# Patient Record
Sex: Female | Born: 1950
Health system: Southern US, Community
[De-identification: ages and names within clinical notes are randomized; demographics above are authoritative.]

## PROBLEM LIST (undated history)

## (undated) DIAGNOSIS — R233 Spontaneous ecchymoses: Secondary | ICD-10-CM

## (undated) DIAGNOSIS — J189 Pneumonia, unspecified organism: Secondary | ICD-10-CM

## (undated) DIAGNOSIS — J302 Other seasonal allergic rhinitis: Secondary | ICD-10-CM

## (undated) DIAGNOSIS — E041 Nontoxic single thyroid nodule: Secondary | ICD-10-CM

## (undated) DIAGNOSIS — Z85828 Personal history of other malignant neoplasm of skin: Secondary | ICD-10-CM

## (undated) DIAGNOSIS — R202 Paresthesia of skin: Secondary | ICD-10-CM

## (undated) DIAGNOSIS — G4733 Obstructive sleep apnea (adult) (pediatric): Secondary | ICD-10-CM

## (undated) DIAGNOSIS — Z9889 Other specified postprocedural states: Secondary | ICD-10-CM

## (undated) DIAGNOSIS — I639 Cerebral infarction, unspecified: Secondary | ICD-10-CM

## (undated) DIAGNOSIS — H269 Unspecified cataract: Secondary | ICD-10-CM

## (undated) DIAGNOSIS — K649 Unspecified hemorrhoids: Secondary | ICD-10-CM

## (undated) DIAGNOSIS — J45909 Unspecified asthma, uncomplicated: Secondary | ICD-10-CM

## (undated) DIAGNOSIS — M8580 Other specified disorders of bone density and structure, unspecified site: Secondary | ICD-10-CM

## (undated) DIAGNOSIS — Z923 Personal history of irradiation: Secondary | ICD-10-CM

## (undated) DIAGNOSIS — J439 Emphysema, unspecified: Secondary | ICD-10-CM

## (undated) DIAGNOSIS — G43909 Migraine, unspecified, not intractable, without status migrainosus: Secondary | ICD-10-CM

## (undated) DIAGNOSIS — G4730 Sleep apnea, unspecified: Secondary | ICD-10-CM

## (undated) DIAGNOSIS — R238 Other skin changes: Secondary | ICD-10-CM

## (undated) DIAGNOSIS — J3089 Other allergic rhinitis: Secondary | ICD-10-CM

## (undated) DIAGNOSIS — Z974 Presence of external hearing-aid: Secondary | ICD-10-CM

## (undated) DIAGNOSIS — Z853 Personal history of malignant neoplasm of breast: Secondary | ICD-10-CM

## (undated) DIAGNOSIS — N950 Postmenopausal bleeding: Secondary | ICD-10-CM

## (undated) DIAGNOSIS — Z8489 Family history of other specified conditions: Secondary | ICD-10-CM

## (undated) DIAGNOSIS — K219 Gastro-esophageal reflux disease without esophagitis: Secondary | ICD-10-CM

## (undated) DIAGNOSIS — Z98811 Dental restoration status: Secondary | ICD-10-CM

## (undated) DIAGNOSIS — C50919 Malignant neoplasm of unspecified site of unspecified female breast: Secondary | ICD-10-CM

## (undated) DIAGNOSIS — I2510 Atherosclerotic heart disease of native coronary artery without angina pectoris: Secondary | ICD-10-CM

## (undated) DIAGNOSIS — C4491 Basal cell carcinoma of skin, unspecified: Secondary | ICD-10-CM

## (undated) DIAGNOSIS — K5900 Constipation, unspecified: Secondary | ICD-10-CM

## (undated) DIAGNOSIS — Z973 Presence of spectacles and contact lenses: Secondary | ICD-10-CM

## (undated) DIAGNOSIS — R232 Flushing: Secondary | ICD-10-CM

## (undated) DIAGNOSIS — M1990 Unspecified osteoarthritis, unspecified site: Secondary | ICD-10-CM

## (undated) DIAGNOSIS — R9389 Abnormal findings on diagnostic imaging of other specified body structures: Secondary | ICD-10-CM

## (undated) DIAGNOSIS — I7090 Unspecified atherosclerosis: Secondary | ICD-10-CM

## (undated) DIAGNOSIS — I10 Essential (primary) hypertension: Secondary | ICD-10-CM

## (undated) DIAGNOSIS — N3281 Overactive bladder: Secondary | ICD-10-CM

## (undated) HISTORY — DX: Other specified disorders of bone density and structure, unspecified site: M85.80

## (undated) HISTORY — PX: BROW LIFT: SHX178

## (undated) HISTORY — DX: Unspecified hemorrhoids: K64.9

## (undated) HISTORY — DX: Gastro-esophageal reflux disease without esophagitis: K21.9

## (undated) HISTORY — DX: Atherosclerotic heart disease of native coronary artery without angina pectoris: I25.10

---

## 1998-07-25 ENCOUNTER — Other Ambulatory Visit: Admission: RE | Admit: 1998-07-25 | Discharge: 1998-07-25 | Payer: Self-pay | Admitting: Obstetrics and Gynecology

## 1999-03-20 ENCOUNTER — Other Ambulatory Visit: Admission: RE | Admit: 1999-03-20 | Discharge: 1999-03-20 | Payer: Self-pay | Admitting: Obstetrics and Gynecology

## 1999-03-20 ENCOUNTER — Encounter (INDEPENDENT_AMBULATORY_CARE_PROVIDER_SITE_OTHER): Payer: Self-pay

## 1999-08-14 ENCOUNTER — Other Ambulatory Visit: Admission: RE | Admit: 1999-08-14 | Discharge: 1999-08-14 | Payer: Self-pay | Admitting: Obstetrics and Gynecology

## 1999-08-15 ENCOUNTER — Other Ambulatory Visit: Admission: RE | Admit: 1999-08-15 | Discharge: 1999-08-15 | Payer: Self-pay | Admitting: Obstetrics and Gynecology

## 1999-08-15 ENCOUNTER — Encounter (INDEPENDENT_AMBULATORY_CARE_PROVIDER_SITE_OTHER): Payer: Self-pay | Admitting: Specialist

## 1999-10-21 ENCOUNTER — Other Ambulatory Visit: Admission: RE | Admit: 1999-10-21 | Discharge: 1999-10-21 | Payer: Self-pay | Admitting: Obstetrics and Gynecology

## 1999-10-21 ENCOUNTER — Encounter (INDEPENDENT_AMBULATORY_CARE_PROVIDER_SITE_OTHER): Payer: Self-pay | Admitting: Specialist

## 2000-08-14 ENCOUNTER — Other Ambulatory Visit: Admission: RE | Admit: 2000-08-14 | Discharge: 2000-08-14 | Payer: Self-pay | Admitting: Obstetrics and Gynecology

## 2000-08-17 ENCOUNTER — Other Ambulatory Visit: Admission: RE | Admit: 2000-08-17 | Discharge: 2000-08-17 | Payer: Self-pay | Admitting: Obstetrics and Gynecology

## 2001-02-02 ENCOUNTER — Other Ambulatory Visit: Admission: RE | Admit: 2001-02-02 | Discharge: 2001-02-02 | Payer: Self-pay | Admitting: Obstetrics and Gynecology

## 2001-09-30 ENCOUNTER — Other Ambulatory Visit: Admission: RE | Admit: 2001-09-30 | Discharge: 2001-09-30 | Payer: Self-pay | Admitting: Obstetrics and Gynecology

## 2003-01-03 ENCOUNTER — Other Ambulatory Visit: Admission: RE | Admit: 2003-01-03 | Discharge: 2003-01-03 | Payer: Self-pay | Admitting: Obstetrics and Gynecology

## 2004-01-04 ENCOUNTER — Other Ambulatory Visit: Admission: RE | Admit: 2004-01-04 | Discharge: 2004-01-04 | Payer: Self-pay | Admitting: Obstetrics and Gynecology

## 2005-01-31 ENCOUNTER — Other Ambulatory Visit: Admission: RE | Admit: 2005-01-31 | Discharge: 2005-01-31 | Payer: Self-pay | Admitting: Obstetrics and Gynecology

## 2006-03-04 ENCOUNTER — Other Ambulatory Visit: Admission: RE | Admit: 2006-03-04 | Discharge: 2006-03-04 | Payer: Self-pay | Admitting: Obstetrics and Gynecology

## 2007-05-20 ENCOUNTER — Other Ambulatory Visit: Admission: RE | Admit: 2007-05-20 | Discharge: 2007-05-20 | Payer: Self-pay | Admitting: Obstetrics and Gynecology

## 2008-07-24 ENCOUNTER — Encounter: Payer: Self-pay | Admitting: Obstetrics and Gynecology

## 2008-07-24 ENCOUNTER — Ambulatory Visit: Payer: Self-pay | Admitting: Obstetrics and Gynecology

## 2008-07-24 ENCOUNTER — Other Ambulatory Visit: Admission: RE | Admit: 2008-07-24 | Discharge: 2008-07-24 | Payer: Self-pay | Admitting: Obstetrics and Gynecology

## 2009-07-28 HISTORY — PX: COLONOSCOPY: SHX174

## 2009-08-20 ENCOUNTER — Ambulatory Visit: Payer: Self-pay | Admitting: Obstetrics and Gynecology

## 2009-08-20 ENCOUNTER — Other Ambulatory Visit: Admission: RE | Admit: 2009-08-20 | Discharge: 2009-08-20 | Payer: Self-pay | Admitting: Obstetrics and Gynecology

## 2009-08-31 ENCOUNTER — Ambulatory Visit: Payer: Self-pay | Admitting: Obstetrics and Gynecology

## 2009-09-04 ENCOUNTER — Ambulatory Visit: Payer: Self-pay | Admitting: Obstetrics and Gynecology

## 2010-04-10 ENCOUNTER — Ambulatory Visit: Payer: Self-pay | Admitting: Gynecology

## 2010-04-29 ENCOUNTER — Ambulatory Visit: Payer: Self-pay | Admitting: Obstetrics and Gynecology

## 2010-05-16 ENCOUNTER — Ambulatory Visit: Payer: Self-pay | Admitting: Obstetrics and Gynecology

## 2010-05-20 ENCOUNTER — Ambulatory Visit: Payer: Self-pay | Admitting: Obstetrics and Gynecology

## 2010-07-28 DIAGNOSIS — E041 Nontoxic single thyroid nodule: Secondary | ICD-10-CM

## 2010-07-28 HISTORY — DX: Nontoxic single thyroid nodule: E04.1

## 2010-09-19 ENCOUNTER — Other Ambulatory Visit: Payer: Self-pay | Admitting: Obstetrics and Gynecology

## 2010-09-19 ENCOUNTER — Encounter (INDEPENDENT_AMBULATORY_CARE_PROVIDER_SITE_OTHER): Payer: BC Managed Care – PPO | Admitting: Obstetrics and Gynecology

## 2010-09-19 ENCOUNTER — Other Ambulatory Visit (HOSPITAL_COMMUNITY)
Admission: RE | Admit: 2010-09-19 | Discharge: 2010-09-19 | Disposition: A | Discharge status: Home / Self Care | Payer: BC Managed Care – PPO | Source: Ambulatory Visit | Attending: Obstetrics and Gynecology | Admitting: Obstetrics and Gynecology

## 2010-09-19 DIAGNOSIS — Z13220 Encounter for screening for lipoid disorders: Secondary | ICD-10-CM

## 2010-09-19 DIAGNOSIS — Z124 Encounter for screening for malignant neoplasm of cervix: Secondary | ICD-10-CM | POA: Insufficient documentation

## 2010-09-19 DIAGNOSIS — Z01419 Encounter for gynecological examination (general) (routine) without abnormal findings: Secondary | ICD-10-CM

## 2010-09-19 DIAGNOSIS — Z833 Family history of diabetes mellitus: Secondary | ICD-10-CM

## 2011-04-03 ENCOUNTER — Other Ambulatory Visit: Payer: Self-pay | Admitting: Internal Medicine

## 2011-04-03 DIAGNOSIS — E041 Nontoxic single thyroid nodule: Secondary | ICD-10-CM

## 2011-04-03 DIAGNOSIS — L659 Nonscarring hair loss, unspecified: Secondary | ICD-10-CM

## 2011-04-04 ENCOUNTER — Ambulatory Visit
Admission: RE | Admit: 2011-04-04 | Discharge: 2011-04-04 | Disposition: A | Discharge status: Home / Self Care | Payer: BC Managed Care – PPO | Source: Ambulatory Visit | Attending: Internal Medicine | Admitting: Internal Medicine

## 2011-04-04 DIAGNOSIS — E041 Nontoxic single thyroid nodule: Secondary | ICD-10-CM

## 2011-04-04 DIAGNOSIS — L659 Nonscarring hair loss, unspecified: Secondary | ICD-10-CM

## 2011-05-06 ENCOUNTER — Other Ambulatory Visit: Payer: Self-pay | Admitting: *Deleted

## 2011-05-06 DIAGNOSIS — N6009 Solitary cyst of unspecified breast: Secondary | ICD-10-CM

## 2011-05-13 ENCOUNTER — Other Ambulatory Visit: Payer: Self-pay | Admitting: Obstetrics and Gynecology

## 2011-05-13 DIAGNOSIS — N6009 Solitary cyst of unspecified breast: Secondary | ICD-10-CM

## 2011-06-15 ENCOUNTER — Other Ambulatory Visit: Payer: Self-pay | Admitting: Obstetrics and Gynecology

## 2011-08-08 ENCOUNTER — Other Ambulatory Visit: Payer: Self-pay | Admitting: Obstetrics and Gynecology

## 2011-09-25 ENCOUNTER — Other Ambulatory Visit: Payer: Self-pay | Admitting: Obstetrics and Gynecology

## 2011-09-25 ENCOUNTER — Encounter (INDEPENDENT_AMBULATORY_CARE_PROVIDER_SITE_OTHER): Payer: BC Managed Care – PPO

## 2011-09-25 DIAGNOSIS — M2550 Pain in unspecified joint: Secondary | ICD-10-CM | POA: Insufficient documentation

## 2011-09-25 DIAGNOSIS — IMO0002 Reserved for concepts with insufficient information to code with codable children: Secondary | ICD-10-CM | POA: Insufficient documentation

## 2011-09-25 DIAGNOSIS — M8580 Other specified disorders of bone density and structure, unspecified site: Secondary | ICD-10-CM

## 2011-09-25 DIAGNOSIS — M899 Disorder of bone, unspecified: Secondary | ICD-10-CM

## 2011-10-07 ENCOUNTER — Encounter: Payer: Self-pay | Admitting: Obstetrics and Gynecology

## 2011-10-10 ENCOUNTER — Encounter: Payer: Self-pay | Admitting: Obstetrics and Gynecology

## 2011-10-10 ENCOUNTER — Other Ambulatory Visit (HOSPITAL_COMMUNITY)
Admission: RE | Admit: 2011-10-10 | Discharge: 2011-10-10 | Disposition: A | Discharge status: Home / Self Care | Payer: BC Managed Care – PPO | Source: Ambulatory Visit | Attending: Obstetrics and Gynecology | Admitting: Obstetrics and Gynecology

## 2011-10-10 ENCOUNTER — Ambulatory Visit (INDEPENDENT_AMBULATORY_CARE_PROVIDER_SITE_OTHER): Payer: BC Managed Care – PPO | Admitting: Obstetrics and Gynecology

## 2011-10-10 VITALS — BP 120/74 | Ht 64.0 in | Wt 148.0 lb

## 2011-10-10 DIAGNOSIS — R12 Heartburn: Secondary | ICD-10-CM | POA: Insufficient documentation

## 2011-10-10 DIAGNOSIS — Z01419 Encounter for gynecological examination (general) (routine) without abnormal findings: Secondary | ICD-10-CM | POA: Insufficient documentation

## 2011-10-10 DIAGNOSIS — Z833 Family history of diabetes mellitus: Secondary | ICD-10-CM

## 2011-10-10 MED ORDER — ALPRAZOLAM 0.25 MG PO TABS: 0.2500 mg | ORAL_TABLET | Freq: Every evening | ORAL | Status: AC | PRN

## 2011-10-10 MED ORDER — CONJ ESTROG-MEDROXYPROGEST ACE 0.45-1.5 MG PO TABS: 1.0000 | ORAL_TABLET | Freq: Every day | ORAL | Status: DC

## 2011-10-10 MED ORDER — ESTRADIOL 10 MCG VA TABS: ORAL_TABLET | VAGINAL | Status: DC

## 2011-10-10 NOTE — Progress Notes (Signed)
Patient came to see me today for her annual GYN exam. She remains on HRT. If she tries to lower the dose or stop it  she is very symptomatic. She is well aware of the slightly higher risk of breast cancer but  Feels  benefits outweigh the risks. She is having no vaginal bleeding. She is having no pelvic pain. She is up-to-date on mammograms. Her last bone density was done here last month. She has stable osteopenia without elevated fracture risk. She is taking calcium and vitamin D. She has had no fractures. She has overactive bladder and has been to the urologist. She was treated with biofeedback but not medication. She was using Vagifem and found that her symptoms were less on the Vagifem.she has stopped the Vagifem but would like to restart it. She also requested something to help her sleep on occasion.  Physical examination:  Kennon Portela present. HEENT within normal limits. Neck: Thyroid not large. No masses. Supraclavicular nodes: not enlarged. Breasts: Examined in both sitting midline position. No skin changes and no masses. Abdomen: Soft no guarding rebound or masses or hernia. Pelvic: External: Within normal limits. BUS: Within normal limits. Vaginal:within normal limits. Good estrogen effect. No evidence of cystocele rectocele or enterocele. Cervix: clean. Uterus: Normal size and shape. Adnexa: No masses. Rectovaginal exam: Confirmatory and negative. Extremities: Within normal limits.    Assessment: #1. Menopausal symptoms #2. Atrophic vaginitis #3. Detrussor instability #4. Osteopenia  Plan: Continue Prempro 0.45 after discussing pros and cons including the higher risk of breast cancer. Reinitiate Vagifem 3 times a week. Continue yearly mammograms. Prescription for Xanax written for sleep disturbance. Patient to return fasting for lab work.

## 2011-10-11 LAB — URINALYSIS W MICROSCOPIC + REFLEX CULTURE
Bacteria, UA: NONE SEEN
Hgb urine dipstick: NEGATIVE
Ketones, ur: NEGATIVE mg/dL
Leukocytes, UA: NEGATIVE
Nitrite: NEGATIVE
Urobilinogen, UA: 0.2 mg/dL (ref 0.0–1.0)

## 2011-10-16 ENCOUNTER — Other Ambulatory Visit: Payer: Self-pay | Admitting: Obstetrics and Gynecology

## 2011-10-21 ENCOUNTER — Other Ambulatory Visit: Payer: BC Managed Care – PPO

## 2011-10-21 DIAGNOSIS — Z01419 Encounter for gynecological examination (general) (routine) without abnormal findings: Secondary | ICD-10-CM

## 2011-10-21 LAB — CBC WITH DIFFERENTIAL/PLATELET
Hemoglobin: 14.2 g/dL (ref 12.0–15.0)
Lymphs Abs: 2.3 10*3/uL (ref 0.7–4.0)
MCH: 29.8 pg (ref 26.0–34.0)
Monocytes Relative: 7 % (ref 3–12)
Neutro Abs: 5.5 10*3/uL (ref 1.7–7.7)
Neutrophils Relative %: 64 % (ref 43–77)
RBC: 4.77 MIL/uL (ref 3.87–5.11)

## 2011-10-29 ENCOUNTER — Other Ambulatory Visit: Payer: Self-pay | Admitting: *Deleted

## 2011-10-29 DIAGNOSIS — Z833 Family history of diabetes mellitus: Secondary | ICD-10-CM

## 2011-12-12 ENCOUNTER — Other Ambulatory Visit: Payer: BC Managed Care – PPO

## 2011-12-12 DIAGNOSIS — Z833 Family history of diabetes mellitus: Secondary | ICD-10-CM

## 2011-12-12 DIAGNOSIS — Z01419 Encounter for gynecological examination (general) (routine) without abnormal findings: Secondary | ICD-10-CM

## 2011-12-12 LAB — LIPID PANEL
Cholesterol: 193 mg/dL (ref 0–200)
Total CHOL/HDL Ratio: 3.2 Ratio
Triglycerides: 70 mg/dL (ref <150)
VLDL: 14 mg/dL (ref 0–40)

## 2012-01-15 ENCOUNTER — Telehealth: Payer: Self-pay | Admitting: *Deleted

## 2012-01-15 NOTE — Telephone Encounter (Signed)
Pt informed of most recent lab results for /17/13.

## 2012-05-07 ENCOUNTER — Other Ambulatory Visit: Payer: Self-pay | Admitting: *Deleted

## 2012-05-07 DIAGNOSIS — R921 Mammographic calcification found on diagnostic imaging of breast: Secondary | ICD-10-CM

## 2012-05-14 ENCOUNTER — Other Ambulatory Visit: Payer: Self-pay | Admitting: Obstetrics and Gynecology

## 2012-05-14 DIAGNOSIS — R921 Mammographic calcification found on diagnostic imaging of breast: Secondary | ICD-10-CM

## 2012-07-13 ENCOUNTER — Ambulatory Visit (INDEPENDENT_AMBULATORY_CARE_PROVIDER_SITE_OTHER): Payer: BC Managed Care – PPO | Admitting: Psychology

## 2012-07-13 DIAGNOSIS — F331 Major depressive disorder, recurrent, moderate: Secondary | ICD-10-CM

## 2012-07-27 ENCOUNTER — Ambulatory Visit (INDEPENDENT_AMBULATORY_CARE_PROVIDER_SITE_OTHER): Payer: BC Managed Care – PPO | Admitting: Psychology

## 2012-07-27 DIAGNOSIS — F331 Major depressive disorder, recurrent, moderate: Secondary | ICD-10-CM

## 2012-08-04 ENCOUNTER — Ambulatory Visit (INDEPENDENT_AMBULATORY_CARE_PROVIDER_SITE_OTHER): Payer: BC Managed Care – PPO | Admitting: Psychology

## 2012-08-04 DIAGNOSIS — F331 Major depressive disorder, recurrent, moderate: Secondary | ICD-10-CM

## 2012-08-10 ENCOUNTER — Ambulatory Visit (INDEPENDENT_AMBULATORY_CARE_PROVIDER_SITE_OTHER): Payer: BC Managed Care – PPO | Admitting: Psychology

## 2012-08-10 DIAGNOSIS — F331 Major depressive disorder, recurrent, moderate: Secondary | ICD-10-CM

## 2012-08-18 ENCOUNTER — Ambulatory Visit (INDEPENDENT_AMBULATORY_CARE_PROVIDER_SITE_OTHER): Payer: BC Managed Care – PPO | Admitting: Psychology

## 2012-08-18 DIAGNOSIS — F331 Major depressive disorder, recurrent, moderate: Secondary | ICD-10-CM

## 2012-08-25 ENCOUNTER — Ambulatory Visit (INDEPENDENT_AMBULATORY_CARE_PROVIDER_SITE_OTHER): Payer: BC Managed Care – PPO | Admitting: Psychology

## 2012-08-25 DIAGNOSIS — F331 Major depressive disorder, recurrent, moderate: Secondary | ICD-10-CM

## 2012-08-29 ENCOUNTER — Other Ambulatory Visit: Payer: Self-pay | Admitting: Obstetrics and Gynecology

## 2012-08-30 NOTE — Telephone Encounter (Signed)
rx called in KW 

## 2012-08-30 NOTE — Telephone Encounter (Signed)
Pt is a former Dr Eda Paschal patient. She uses Xanax periodically for sleep. Her last rx was 09/2011. She is due for her annual 09/2012. KW

## 2012-09-01 ENCOUNTER — Ambulatory Visit (INDEPENDENT_AMBULATORY_CARE_PROVIDER_SITE_OTHER): Payer: BC Managed Care – PPO | Admitting: Psychology

## 2012-09-01 DIAGNOSIS — F331 Major depressive disorder, recurrent, moderate: Secondary | ICD-10-CM

## 2012-09-16 ENCOUNTER — Ambulatory Visit (INDEPENDENT_AMBULATORY_CARE_PROVIDER_SITE_OTHER): Payer: BC Managed Care – PPO | Admitting: Psychology

## 2012-09-16 DIAGNOSIS — F331 Major depressive disorder, recurrent, moderate: Secondary | ICD-10-CM

## 2012-09-22 ENCOUNTER — Ambulatory Visit: Payer: BC Managed Care – PPO | Admitting: Psychology

## 2012-09-29 ENCOUNTER — Ambulatory Visit (INDEPENDENT_AMBULATORY_CARE_PROVIDER_SITE_OTHER): Payer: BC Managed Care – PPO | Admitting: Psychology

## 2012-09-29 DIAGNOSIS — F331 Major depressive disorder, recurrent, moderate: Secondary | ICD-10-CM

## 2012-10-13 ENCOUNTER — Ambulatory Visit (INDEPENDENT_AMBULATORY_CARE_PROVIDER_SITE_OTHER): Payer: BC Managed Care – PPO | Admitting: Psychology

## 2012-10-13 ENCOUNTER — Other Ambulatory Visit: Payer: Self-pay | Admitting: *Deleted

## 2012-10-13 DIAGNOSIS — F331 Major depressive disorder, recurrent, moderate: Secondary | ICD-10-CM

## 2012-10-13 MED ORDER — CONJ ESTROG-MEDROXYPROGEST ACE 0.45-1.5 MG PO TABS: 1.0000 | ORAL_TABLET | Freq: Every day | ORAL | Status: DC

## 2012-10-20 ENCOUNTER — Ambulatory Visit (INDEPENDENT_AMBULATORY_CARE_PROVIDER_SITE_OTHER): Payer: BC Managed Care – PPO | Admitting: Psychology

## 2012-10-20 DIAGNOSIS — F331 Major depressive disorder, recurrent, moderate: Secondary | ICD-10-CM

## 2012-10-22 ENCOUNTER — Encounter: Payer: Self-pay | Admitting: Obstetrics and Gynecology

## 2012-10-22 ENCOUNTER — Ambulatory Visit (INDEPENDENT_AMBULATORY_CARE_PROVIDER_SITE_OTHER): Payer: BC Managed Care – PPO | Admitting: Gynecology

## 2012-10-22 ENCOUNTER — Encounter: Payer: Self-pay | Admitting: Gynecology

## 2012-10-22 VITALS — BP 120/70 | Ht 65.0 in | Wt 147.0 lb

## 2012-10-22 DIAGNOSIS — Z01419 Encounter for gynecological examination (general) (routine) without abnormal findings: Secondary | ICD-10-CM

## 2012-10-22 DIAGNOSIS — Z79890 Hormone replacement therapy (postmenopausal): Secondary | ICD-10-CM

## 2012-10-22 DIAGNOSIS — M899 Disorder of bone, unspecified: Secondary | ICD-10-CM

## 2012-10-22 DIAGNOSIS — M8580 Other specified disorders of bone density and structure, unspecified site: Secondary | ICD-10-CM

## 2012-10-22 DIAGNOSIS — N318 Other neuromuscular dysfunction of bladder: Secondary | ICD-10-CM

## 2012-10-22 DIAGNOSIS — N3281 Overactive bladder: Secondary | ICD-10-CM

## 2012-10-22 MED ORDER — CONJ ESTROG-MEDROXYPROGEST ACE 0.45-1.5 MG PO TABS: 1.0000 | ORAL_TABLET | Freq: Every day | ORAL | Status: DC

## 2012-10-22 MED ORDER — ESTRADIOL 10 MCG VA TABS: ORAL_TABLET | VAGINAL | Status: DC

## 2012-10-22 NOTE — Progress Notes (Signed)
Sabrina Tapia [birth year] [id number]        62 y.o.  G0P0 for annual exam.  Former patient of Dr. Eda Tapia. Several issues noted below.  Past medical history,surgical history, medications, allergies, family history and social history were all reviewed and documented in the EPIC chart. ROS:  Was performed and pertinent positives and negatives are included in the history.  Exam: Sabrina Tapia Filed Vitals:   10/22/12 1153  BP: 120/70  Height: 5\' 5"  (1.651 m)  Weight: 147 lb (66.679 kg)   General appearance  Normal Skin grossly normal Head/Neck normal with no cervical or supraclavicular adenopathy thyroid normal Lungs  clear Cardiac RR, without RMG Abdominal  soft, nontender, without masses, organomegaly or hernia Breasts  examined lying and sitting without masses, retractions, discharge or axillary adenopathy. Pelvic  Ext/BUS/vagina  normal with atrophic changes  Cervix  normal   Uterus  anteverted, normal size, shape and contour, midline and mobile nontender   Adnexa  Without masses or tenderness    Anus and perineum  normal   Rectovaginal  normal sphincter tone without palpated masses or tenderness.    Assessment/Plan:  62 y.o. G0P0 female for annual exam.   1. HRT. Patient's on Prempro 0.45-1.5. Doing well on this. Had tried weaning previously with unacceptable hot flushes/sweats. I reviewed the WHI study with increased risk of stroke heart attack DVT and breast cancer. ACOG and NAMS statements her lowest dose for shortest period of time reviewed. Options to continue or try to wean discussed. The patient would prefer to continue at this time accepting the risks I refilled her times year. 2. Detrusor instability. Patient's having urgency type symptoms on and off. Saw urology who talked about behavior modification. When she does avoid caffeine and carbonated beverages she does better. Uses Vagifem 10 mcg twice to 3 times weekly and this seems to help. She stopped in the past and  had a flare of her urgency symptoms. Reviewed medication alternatives to include Enablex or similar or Myrbetriq. Patient is not interested at this time. She preferred just to monitor. We'll follow up she wants trial of medication. 3. Osteopenia. DEXA 08/2011 T score -2.1 FRAX 9.7%/1.3%. Is on extra vitamin D and calcium. He is to have blood drawn for her primary physician this coming spring I asked her to add a vitamin D level she will asked him to do so. We'll plan repeat DEXA next year to year or irritable. 4. Pap smear 2013. No Pap smear done today. History of LGSIL 2001 colposcopy biopsy negative. No treatment. Negative Pap smears since then. Reviewed current screening guidelines and plan Pap smear repeat at 3 year interval. 5. Mammography 04/2012. Continue with annual mammography. SBE monthly reviewed. 6. Health maintenance. The blood work done as it is all done through Dr. Clarene Tapia office. Followup one year, sooner as needed.    Dara Lords MD, 12:36 PM 10/22/2012

## 2012-10-22 NOTE — Patient Instructions (Signed)
Followup in 1 year. Sooner if any issues.

## 2012-11-03 ENCOUNTER — Ambulatory Visit (INDEPENDENT_AMBULATORY_CARE_PROVIDER_SITE_OTHER): Payer: BC Managed Care – PPO | Admitting: Psychology

## 2012-11-03 DIAGNOSIS — F331 Major depressive disorder, recurrent, moderate: Secondary | ICD-10-CM

## 2012-11-17 ENCOUNTER — Ambulatory Visit (INDEPENDENT_AMBULATORY_CARE_PROVIDER_SITE_OTHER): Payer: BC Managed Care – PPO | Admitting: Psychology

## 2012-11-17 DIAGNOSIS — F331 Major depressive disorder, recurrent, moderate: Secondary | ICD-10-CM

## 2012-12-02 ENCOUNTER — Ambulatory Visit (INDEPENDENT_AMBULATORY_CARE_PROVIDER_SITE_OTHER): Payer: BC Managed Care – PPO | Admitting: Psychology

## 2012-12-02 DIAGNOSIS — F331 Major depressive disorder, recurrent, moderate: Secondary | ICD-10-CM

## 2012-12-16 ENCOUNTER — Ambulatory Visit (INDEPENDENT_AMBULATORY_CARE_PROVIDER_SITE_OTHER): Payer: BC Managed Care – PPO | Admitting: Psychology

## 2012-12-16 DIAGNOSIS — F331 Major depressive disorder, recurrent, moderate: Secondary | ICD-10-CM

## 2012-12-31 ENCOUNTER — Ambulatory Visit (INDEPENDENT_AMBULATORY_CARE_PROVIDER_SITE_OTHER): Payer: BC Managed Care – PPO | Admitting: Psychology

## 2012-12-31 DIAGNOSIS — F331 Major depressive disorder, recurrent, moderate: Secondary | ICD-10-CM

## 2013-01-13 ENCOUNTER — Ambulatory Visit: Payer: BC Managed Care – PPO | Admitting: Psychology

## 2013-01-21 ENCOUNTER — Ambulatory Visit: Payer: BC Managed Care – PPO | Admitting: Psychology

## 2013-01-31 ENCOUNTER — Ambulatory Visit (INDEPENDENT_AMBULATORY_CARE_PROVIDER_SITE_OTHER): Payer: BC Managed Care – PPO | Admitting: Psychology

## 2013-01-31 DIAGNOSIS — F331 Major depressive disorder, recurrent, moderate: Secondary | ICD-10-CM

## 2013-02-17 ENCOUNTER — Ambulatory Visit: Payer: BC Managed Care – PPO | Admitting: Psychology

## 2013-02-28 ENCOUNTER — Ambulatory Visit (INDEPENDENT_AMBULATORY_CARE_PROVIDER_SITE_OTHER): Payer: BC Managed Care – PPO | Admitting: Psychology

## 2013-02-28 DIAGNOSIS — F331 Major depressive disorder, recurrent, moderate: Secondary | ICD-10-CM

## 2013-03-17 ENCOUNTER — Ambulatory Visit (INDEPENDENT_AMBULATORY_CARE_PROVIDER_SITE_OTHER): Payer: BC Managed Care – PPO | Admitting: Psychology

## 2013-03-17 DIAGNOSIS — F331 Major depressive disorder, recurrent, moderate: Secondary | ICD-10-CM

## 2013-03-29 ENCOUNTER — Ambulatory Visit (INDEPENDENT_AMBULATORY_CARE_PROVIDER_SITE_OTHER): Payer: BC Managed Care – PPO | Admitting: Psychology

## 2013-03-29 DIAGNOSIS — F331 Major depressive disorder, recurrent, moderate: Secondary | ICD-10-CM

## 2013-04-18 ENCOUNTER — Ambulatory Visit (INDEPENDENT_AMBULATORY_CARE_PROVIDER_SITE_OTHER): Payer: BC Managed Care – PPO | Admitting: Psychology

## 2013-04-18 DIAGNOSIS — F331 Major depressive disorder, recurrent, moderate: Secondary | ICD-10-CM

## 2013-05-06 ENCOUNTER — Ambulatory Visit (INDEPENDENT_AMBULATORY_CARE_PROVIDER_SITE_OTHER): Payer: BC Managed Care – PPO | Admitting: Psychology

## 2013-05-06 DIAGNOSIS — F331 Major depressive disorder, recurrent, moderate: Secondary | ICD-10-CM

## 2013-05-16 ENCOUNTER — Encounter: Payer: Self-pay | Admitting: Gynecology

## 2013-05-16 ENCOUNTER — Ambulatory Visit (INDEPENDENT_AMBULATORY_CARE_PROVIDER_SITE_OTHER): Payer: BC Managed Care – PPO | Admitting: Gynecology

## 2013-05-16 DIAGNOSIS — R32 Unspecified urinary incontinence: Secondary | ICD-10-CM

## 2013-05-16 DIAGNOSIS — N9089 Other specified noninflammatory disorders of vulva and perineum: Secondary | ICD-10-CM

## 2013-05-16 DIAGNOSIS — L293 Anogenital pruritus, unspecified: Secondary | ICD-10-CM

## 2013-05-16 DIAGNOSIS — N907 Vulvar cyst: Secondary | ICD-10-CM

## 2013-05-16 DIAGNOSIS — N898 Other specified noninflammatory disorders of vagina: Secondary | ICD-10-CM

## 2013-05-16 LAB — WET PREP FOR TRICH, YEAST, CLUE
Clue Cells Wet Prep HPF POC: NONE SEEN
Trich, Wet Prep: NONE SEEN

## 2013-05-16 MED ORDER — CLINDAMYCIN PHOSPHATE 2 % VA CREA: 1.0000 | TOPICAL_CREAM | Freq: Every day | VAGINAL | Status: DC

## 2013-05-16 NOTE — Progress Notes (Signed)
Patient presents with 3 issues: 1. Vaginal itching noticed in the last several days. No real significant discharge. 2. Small mass left upper labia noticed 2 days ago. Not tender or draining. 3. Urinary incontinence. Notes loss of urine sometimes spontaneously in small amounts without coughing sneezing laughing or urgency symptoms.  Exam with [name] assistant Abdomen soft nontender without masses guarding rebound organomegaly Pelvic external BUS vagina with atrophic changes. Small mobile 1 cm mass upper junction of left labia minora and labia majora. No overlying skin changes or connection with the skin. No erythema or draining. No inguinal adenopathy. Vagina atrophic with yellowish discharge. Cervix atrophic flush with the upper vagina. Bimanual without masses or tenderness.  Assessment and plan: 1. Vaginal itching. Exam consistent with Bactroban stenosis. Wet prep confirms. We'll treat with Cleocin vaginal cream nightly x7 days at her choice. 2. Small mass upper left labia lateral to the clitoral region. Suspect small subcutaneous cyst. Noninflammatory and nontender. Options to observe/attempt drainage/excision reviewed. At this point patient would prefer observation. If enlarges or becomes symptomatic will represent. It stays stable then we'll follow up at her March 2015 office visit. 3. Urinary incontinence. Had been evaluated earlier by urologist and felt more consistent with urgency symptoms. Was having more of these previously exacerbated by caffeine and carbonated beverages. These current symptoms seem less urgency but more sphincter incompetence. No SUI symptoms. Possible asymptomatic detrusor instability leading to the loss of urine. Patient needs repeat evaluation by urology and probable urodynamics. Possible treatment options to include surgery versus medication review with the patient. Patient to followup with urology and we'll go from there.

## 2013-05-16 NOTE — Patient Instructions (Signed)
Office will call you to help arrange an appointment with urology. Use the vaginal cream nightly for 7 days. Followup if symptoms persist, worsen or recur. Follow the small lump in the vulva. If it changes such as enlarges or becomes painful then followup for treatment, otherwise followup in March 2015 when you're due for her annual exam.

## 2013-05-17 ENCOUNTER — Telehealth: Payer: Self-pay | Admitting: *Deleted

## 2013-05-17 LAB — URINALYSIS W MICROSCOPIC + REFLEX CULTURE
Bacteria, UA: NONE SEEN
Bilirubin Urine: NEGATIVE
Crystals: NONE SEEN
Glucose, UA: NEGATIVE mg/dL
Ketones, ur: NEGATIVE mg/dL
Specific Gravity, Urine: 1.011 (ref 1.005–1.030)
Squamous Epithelial / LPF: NONE SEEN

## 2013-05-17 NOTE — Telephone Encounter (Signed)
Message copied by Aura Camps on Tue May 17, 2013  9:28 AM ------      Message from: Dara Lords      Created: Mon May 16, 2013  4:30 PM       Schedule appointment with Dr. Terrilee Files in urology reference urinary incontinence. ------

## 2013-05-17 NOTE — Telephone Encounter (Signed)
Appt. On 06/15/13 @ 12:45 pm pt informed with the time and date, note will be faxed.

## 2013-05-18 ENCOUNTER — Other Ambulatory Visit: Payer: Self-pay | Admitting: Gynecology

## 2013-05-18 MED ORDER — NITROFURANTOIN MONOHYD MACRO 100 MG PO CAPS: 100.0000 mg | ORAL_CAPSULE | Freq: Two times a day (BID) | ORAL | Status: DC

## 2013-05-27 ENCOUNTER — Ambulatory Visit (INDEPENDENT_AMBULATORY_CARE_PROVIDER_SITE_OTHER): Payer: BC Managed Care – PPO | Admitting: Psychology

## 2013-05-27 DIAGNOSIS — F331 Major depressive disorder, recurrent, moderate: Secondary | ICD-10-CM

## 2013-06-03 ENCOUNTER — Telehealth: Payer: Self-pay | Admitting: *Deleted

## 2013-06-03 MED ORDER — FLUCONAZOLE 150 MG PO TABS: 150.0000 mg | ORAL_TABLET | Freq: Once | ORAL | Status: DC

## 2013-06-03 NOTE — Telephone Encounter (Signed)
Diflucan 150mg x 1

## 2013-06-03 NOTE — Telephone Encounter (Signed)
Pt informed, rx sent 

## 2013-06-03 NOTE — Telephone Encounter (Signed)
Pt was treated for BV infection on 05/16/13 and for UTI on 05/18/13 took both Rx and now having some vaginal discomfort itching mainly. No odor, no burning, no back pain. Please advise

## 2013-06-13 ENCOUNTER — Telehealth: Payer: Self-pay | Admitting: *Deleted

## 2013-06-13 NOTE — Telephone Encounter (Signed)
Pt called requesting order faxed to [facility] for diag. Mammogram, order faxed. Pt informed.

## 2013-06-16 ENCOUNTER — Ambulatory Visit: Payer: BC Managed Care – PPO | Admitting: Psychology

## 2013-06-27 ENCOUNTER — Encounter: Payer: Self-pay | Admitting: Gynecology

## 2013-06-27 ENCOUNTER — Other Ambulatory Visit: Payer: Self-pay | Admitting: Radiology

## 2013-06-28 ENCOUNTER — Other Ambulatory Visit: Payer: Self-pay | Admitting: *Deleted

## 2013-06-28 DIAGNOSIS — Z9889 Other specified postprocedural states: Secondary | ICD-10-CM

## 2013-06-29 ENCOUNTER — Ambulatory Visit (INDEPENDENT_AMBULATORY_CARE_PROVIDER_SITE_OTHER): Payer: BC Managed Care – PPO | Admitting: Psychology

## 2013-06-29 DIAGNOSIS — F331 Major depressive disorder, recurrent, moderate: Secondary | ICD-10-CM

## 2013-06-30 ENCOUNTER — Encounter: Payer: Self-pay | Admitting: Gynecology

## 2013-07-14 ENCOUNTER — Ambulatory Visit: Payer: BC Managed Care – PPO | Admitting: Psychology

## 2013-07-25 ENCOUNTER — Ambulatory Visit (INDEPENDENT_AMBULATORY_CARE_PROVIDER_SITE_OTHER): Payer: BC Managed Care – PPO | Admitting: Psychology

## 2013-07-25 DIAGNOSIS — F331 Major depressive disorder, recurrent, moderate: Secondary | ICD-10-CM

## 2013-08-09 ENCOUNTER — Other Ambulatory Visit: Payer: Self-pay | Admitting: Dermatology

## 2013-08-11 ENCOUNTER — Ambulatory Visit (INDEPENDENT_AMBULATORY_CARE_PROVIDER_SITE_OTHER): Payer: BC Managed Care – PPO | Admitting: Psychology

## 2013-08-11 DIAGNOSIS — F331 Major depressive disorder, recurrent, moderate: Secondary | ICD-10-CM

## 2013-09-01 ENCOUNTER — Ambulatory Visit (INDEPENDENT_AMBULATORY_CARE_PROVIDER_SITE_OTHER): Payer: BC Managed Care – PPO | Admitting: Psychology

## 2013-09-01 DIAGNOSIS — F331 Major depressive disorder, recurrent, moderate: Secondary | ICD-10-CM

## 2013-09-22 ENCOUNTER — Ambulatory Visit: Payer: BC Managed Care – PPO | Admitting: Psychology

## 2013-10-06 ENCOUNTER — Ambulatory Visit: Payer: BC Managed Care – PPO | Admitting: Psychology

## 2013-10-10 ENCOUNTER — Other Ambulatory Visit: Payer: Self-pay | Admitting: Gynecology

## 2013-10-26 ENCOUNTER — Ambulatory Visit (INDEPENDENT_AMBULATORY_CARE_PROVIDER_SITE_OTHER): Payer: BC Managed Care – PPO | Admitting: Psychology

## 2013-10-26 DIAGNOSIS — F331 Major depressive disorder, recurrent, moderate: Secondary | ICD-10-CM

## 2013-10-27 ENCOUNTER — Ambulatory Visit (INDEPENDENT_AMBULATORY_CARE_PROVIDER_SITE_OTHER): Payer: BC Managed Care – PPO | Admitting: Gynecology

## 2013-10-27 ENCOUNTER — Encounter: Payer: Self-pay | Admitting: Gynecology

## 2013-10-27 VITALS — BP 120/76 | Ht 64.0 in | Wt 154.0 lb

## 2013-10-27 DIAGNOSIS — M899 Disorder of bone, unspecified: Secondary | ICD-10-CM

## 2013-10-27 DIAGNOSIS — N318 Other neuromuscular dysfunction of bladder: Secondary | ICD-10-CM

## 2013-10-27 DIAGNOSIS — Z79890 Hormone replacement therapy (postmenopausal): Secondary | ICD-10-CM

## 2013-10-27 DIAGNOSIS — M8580 Other specified disorders of bone density and structure, unspecified site: Secondary | ICD-10-CM

## 2013-10-27 DIAGNOSIS — Z01419 Encounter for gynecological examination (general) (routine) without abnormal findings: Secondary | ICD-10-CM

## 2013-10-27 DIAGNOSIS — N3281 Overactive bladder: Secondary | ICD-10-CM

## 2013-10-27 DIAGNOSIS — M949 Disorder of cartilage, unspecified: Secondary | ICD-10-CM

## 2013-10-27 MED ORDER — CONJ ESTROG-MEDROXYPROGEST ACE 0.45-1.5 MG PO TABS: ORAL_TABLET | ORAL | Status: DC

## 2013-10-27 NOTE — Patient Instructions (Signed)
Follow-up for bone density as scheduled.  Follow-up in 1 year for annual exam. 

## 2013-10-27 NOTE — Progress Notes (Signed)
Sabrina Tapia Jun 22, 1951 [id number]        63 y.o.  G0P0 for annual exam.  Several issues noted below.  Past medical history,surgical history, problem list, medications, allergies, family history and social history were all reviewed and documented in the EPIC chart.  ROS:  Performed and pertinent positives and negatives are included in the history, assessment and plan .  Exam: [name] assistant Filed Vitals:   10/27/13 1101  BP: 120/76  Height: 5\' 4"  (1.626 m)  Weight: 154 lb (69.854 kg)   General appearance  Normal Skin grossly normal Head/Neck normal with no cervical or supraclavicular adenopathy thyroid normal Lungs  clear Cardiac RR, without RMG Abdominal  soft, nontender, without masses, organomegaly or hernia Breasts  examined lying and sitting without masses, retractions, discharge or axillary adenopathy. Pelvic  Ext/BUS/vagina with generalized atrophic changes  Cervix normal with atrophic changes  Uterus anteverted normal size midline mobile nontender  Adnexa  Without masses or tenderness    Anus and perineum  Normal   Rectovaginal  Normal sphincter tone without palpated masses or tenderness.    Assessment/Plan:  63 y.o. G0P0 female for annual exam.   1. Postmenopausal/HRT. Continues on Prempro 0.45/1.5.  I again reviewed the whole issue of HRT with her to include the WHI study with increased risk of stroke, heart attack, DVT and breast cancer. The ACOG and NAMS statements for lowest dose for the shortest period of time reviewed. Transdermal versus oral first-pass effect benefit discussed. After lengthy discussion the patient wants to continue it I refilled her x1 year. 2. Detrusor instability. Patient saw urology who gave her a number of different samples. She's tried Enablex, VESIcare and myrbetriq.  She feels VESIcare works best and 5 mg. Has annual prescription from the urologist. Check urinalysis today. 3. Osteopenia. DEXA 08/2013 T score -2.1 FRAX 9.7%/1.3%. Repeat DEXA  now a two-year interval. Increase calcium vitamin D reviewed. 4. Pap smear 2013. No Pap smear done today. Plan repeat Pap smear next year a 3 year interval. History of LGSIL 2001 with negative Pap smears since then. 5. Mammography 06/2013. Complex cyst was seen. They recommended aspiration biopsy but unclear if patient had this done. We will followup with her. SBE monthly reviewed. 6. Colonoscopy 2011. Repeat at their recommended interval. 7. Health maintenance. No routine blood work done as she reports this all done through her primary physician's office. Followup in one year, sooner as needed.   Note: This document was prepared with digital dictation and possible smart phrase technology. Any transcriptional errors that result from this process are unintentional.   Anastasio Auerbach MD, 11:47 AM 10/27/2013

## 2013-10-28 LAB — URINALYSIS W MICROSCOPIC + REFLEX CULTURE
Bacteria, UA: NONE SEEN
Bilirubin Urine: NEGATIVE
Casts: NONE SEEN
Crystals: NONE SEEN
GLUCOSE, UA: NEGATIVE mg/dL
Hgb urine dipstick: NEGATIVE
KETONES UR: NEGATIVE mg/dL
Leukocytes, UA: NEGATIVE
Nitrite: NEGATIVE
Protein, ur: NEGATIVE mg/dL
SQUAMOUS EPITHELIAL / LPF: NONE SEEN
Specific Gravity, Urine: 1.005 (ref 1.005–1.030)
Urobilinogen, UA: 0.2 mg/dL (ref 0.0–1.0)
pH: 7 (ref 5.0–8.0)

## 2013-10-31 ENCOUNTER — Telehealth: Payer: Self-pay | Admitting: *Deleted

## 2013-10-31 NOTE — Telephone Encounter (Signed)
Left message for pt to call.

## 2013-10-31 NOTE — Telephone Encounter (Signed)
Message copied by Thamas Jaegers on Mon Oct 31, 2013 12:25 PM ------      Message from: Anastasio Auerbach      Created: Thu Oct 27, 2013 11:55 AM       Check with patient. I read her mammogram and followup ultrasound report from December. She had a complex cyst and they stated aspiration/biopsy recommended in their ultrasound report. It stated they discussed this with the patient but I do not see where that was done and I did not ask her about it at the visit. Let me know. ------

## 2013-11-01 NOTE — Telephone Encounter (Signed)
Pt said she had this done, I spoke with [facility] and results will be faxed to be scanned in system.

## 2013-11-25 DIAGNOSIS — M8580 Other specified disorders of bone density and structure, unspecified site: Secondary | ICD-10-CM

## 2013-11-25 HISTORY — DX: Other specified disorders of bone density and structure, unspecified site: M85.80

## 2013-12-03 ENCOUNTER — Other Ambulatory Visit: Payer: Self-pay | Admitting: Gynecology

## 2013-12-15 ENCOUNTER — Ambulatory Visit (INDEPENDENT_AMBULATORY_CARE_PROVIDER_SITE_OTHER): Payer: BC Managed Care – PPO

## 2013-12-15 DIAGNOSIS — M899 Disorder of bone, unspecified: Secondary | ICD-10-CM

## 2013-12-15 DIAGNOSIS — M8580 Other specified disorders of bone density and structure, unspecified site: Secondary | ICD-10-CM

## 2013-12-15 DIAGNOSIS — M949 Disorder of cartilage, unspecified: Secondary | ICD-10-CM

## 2013-12-16 ENCOUNTER — Ambulatory Visit (INDEPENDENT_AMBULATORY_CARE_PROVIDER_SITE_OTHER): Payer: BC Managed Care – PPO | Admitting: Psychology

## 2013-12-16 DIAGNOSIS — F331 Major depressive disorder, recurrent, moderate: Secondary | ICD-10-CM

## 2013-12-20 ENCOUNTER — Ambulatory Visit (INDEPENDENT_AMBULATORY_CARE_PROVIDER_SITE_OTHER): Payer: BC Managed Care – PPO | Admitting: General Surgery

## 2013-12-20 ENCOUNTER — Encounter: Payer: Self-pay | Admitting: Gynecology

## 2013-12-20 VITALS — BP 116/70 | HR 74 | Temp 97.3°F | Resp 16 | Ht 64.0 in | Wt 150.6 lb

## 2013-12-20 DIAGNOSIS — K645 Perianal venous thrombosis: Secondary | ICD-10-CM

## 2013-12-20 MED ORDER — LIDOCAINE 5 % EX OINT: 1.0000 "application " | TOPICAL_OINTMENT | CUTANEOUS | Status: DC | PRN

## 2013-12-20 MED ORDER — HYDROCODONE-ACETAMINOPHEN 5-325 MG PO TABS: 1.0000 | ORAL_TABLET | Freq: Four times a day (QID) | ORAL | Status: DC | PRN

## 2013-12-20 NOTE — Patient Instructions (Signed)
Warm sitz baths as needed. Wear a pad for drainage.

## 2013-12-20 NOTE — Progress Notes (Signed)
Chief complaint: Rectal pain, hemorrhoids  History: Patient is a pleasant 63 year old female who presents with approximately 4-5 days of increasing rectal discomfort and a palpable lump. She has a history of a thrombosed external hemorrhoid about 3 years ago that was excised in the office. At that point she states that the pain had become very severe prior to the procedure and she did not want f happen again. She is having only mild to moderate discomfort. No bleeding. She does not have any significant chronic hemorrhoid symptoms between these 2 episodes. She does have some intermittent chronic constipation and was constipated last week but now is taking MiraLAX with some improvement.  Past Medical History  Diagnosis Date  . Joint pain   . Osteopenia 11/2013    T score -2.1 FRAX 7.7%/0.7% stable from prior DEXA  . Heart burn   . Cervical polyp   . LGSIL (low grade squamous intraepithelial dysplasia) 2001    colpo biopsy negative, negative paps since  . Detrusor instability   . GERD (gastroesophageal reflux disease)   . Arthritis    Past Surgical History  Procedure Laterality Date  . Colposcopy     Current Outpatient Prescriptions  Medication Sig Dispense Refill  . ALPRAZolam (XANAX) 0.25 MG tablet TAKE 1 TABLET BY MOUTH AT BEDTIME AS NEEDED FOR SLEEP  30 tablet  0  . aspirin 81 MG tablet Take 81 mg by mouth daily.      . Calcium Carbonate-Vitamin D (CALCIUM + D PO) Take by mouth.      . Cholecalciferol (VITAMIN D) 2000 UNITS CAPS Take by mouth.      . estrogen, conjugated,-medroxyprogesterone (PREMPRO) 0.45-1.5 MG per tablet TAKE 1 TABLET BY MOUTH EVERY DAY  28 tablet  11  . fluticasone (FLONASE) 50 MCG/ACT nasal spray       . LUTEIN PO Take by mouth.      . Multiple Vitamin (MULTIVITAMIN) tablet Take 1 tablet by mouth daily.      . Omega-3 Fatty Acids (FISH OIL PO) Take by mouth.      . Omeprazole (PRILOSEC PO) Take by mouth.      Marland Kitchen OVER THE COUNTER MEDICATION       . PREMPRO  0.45-1.5 MG per tablet TAKE 1 TABLET BY MOUTH EVERY DAY  28 tablet  11  . Probiotic Product (PROBIOTIC DAILY PO) Take by mouth.      Marland Kitchen QVAR 80 MCG/ACT inhaler       . VESICARE 5 MG tablet       . HYDROcodone-acetaminophen (NORCO) 5-325 MG per tablet Take 1-2 tablets by mouth every 6 (six) hours as needed.  30 tablet  0  . lidocaine (XYLOCAINE) 5 % ointment Apply 1 application topically as needed.  35.44 g  0   No current facility-administered medications for this visit.   Exam: BP 116/70  Pulse 74  Temp(Src) 97.3 F (36.3 C) (Temporal)  Resp 16  Ht 5\' 4"  (1.626 m)  Wt 150 lb 9.6 oz (68.312 kg)  BMI 25.84 kg/m2 General: Well-developed healthy-appearing Caucasian female pertinent exam was limited do a rectal exam. There are 2 thrombosed external hemorrhoids in the right lateral and left lateral position, somewhat larger on the right where she is symptomatic. No significant inflammation. These are small, subtle 1 cm each.  Assessment and plan: Thrombosed external hemorrhoids. I discussed options since these are small and mildly symptomatic. This included symptomatic management versus excision. She is for a concern that this could get worse  as her last episode did and she would like to have these excised which is reasonable.  After explaining the procedure under local anesthesia I performed 2 Limited excisions of the areas described above. Clot was removed from each. The wounds were cauterized. She was given a prescription for pain medication and lidocaine ointment. She will return if she is not feeling steadily better.

## 2014-01-30 ENCOUNTER — Telehealth (INDEPENDENT_AMBULATORY_CARE_PROVIDER_SITE_OTHER): Payer: Self-pay | Admitting: General Surgery

## 2014-01-30 DIAGNOSIS — K644 Residual hemorrhoidal skin tags: Secondary | ICD-10-CM

## 2014-01-30 MED ORDER — HYDROCORTISONE 2.5 % RE CREA: 1.0000 "application " | TOPICAL_CREAM | Freq: Four times a day (QID) | RECTAL | Status: DC

## 2014-01-30 NOTE — Telephone Encounter (Signed)
Patient called in explaining that she was seen in urgent office for thrombose hems on 12/20/13.  One has healed beautifully but the other still has a small opening.  Denies drainage or sharp pains but it does flare up with bowel movements.  She explained that it is mildly uncomfortable and she would like to see if there are any creams that Dr. Excell Seltzer would be willing to call in for her.  She is continuing with sitz baths 2x daily as well as ice 2x daily.  Informed her that he might want to see her back again before prescribing anything but that I would send this message first to see what he says.  Informed her that we would get back in touch with her once we received a response.  Patient verbalized understanding.

## 2014-01-30 NOTE — Telephone Encounter (Signed)
Could call in Anusol HC cream 4 times a day when necessary please if not improving needs to come back to the office.

## 2014-01-30 NOTE — Addendum Note (Signed)
Addended byFlossie Buffy on: 01/30/2014 05:12 PM   Modules accepted: Orders

## 2014-01-30 NOTE — Telephone Encounter (Signed)
Called patient to let her know that we e-scribe anusol cream to her pharmacy.  Instructed her to try this as directed but that if it does not seem to be improving in the next week or two she needs to give [place] a call so we can recheck her.

## 2014-05-11 ENCOUNTER — Ambulatory Visit (INDEPENDENT_AMBULATORY_CARE_PROVIDER_SITE_OTHER): Payer: BC Managed Care – PPO | Admitting: Sports Medicine

## 2014-05-11 ENCOUNTER — Encounter: Payer: Self-pay | Admitting: Sports Medicine

## 2014-05-11 VITALS — BP 130/70 | Ht 64.0 in | Wt 147.0 lb

## 2014-05-11 DIAGNOSIS — M25572 Pain in left ankle and joints of left foot: Secondary | ICD-10-CM | POA: Diagnosis not present

## 2014-05-11 DIAGNOSIS — M25579 Pain in unspecified ankle and joints of unspecified foot: Secondary | ICD-10-CM | POA: Insufficient documentation

## 2014-05-11 DIAGNOSIS — M7752 Other enthesopathy of left foot: Secondary | ICD-10-CM | POA: Diagnosis not present

## 2014-05-11 DIAGNOSIS — M7750 Other enthesopathy of unspecified foot: Secondary | ICD-10-CM | POA: Insufficient documentation

## 2014-05-11 DIAGNOSIS — M25571 Pain in right ankle and joints of right foot: Secondary | ICD-10-CM

## 2014-05-11 DIAGNOSIS — M722 Plantar fascial fibromatosis: Secondary | ICD-10-CM | POA: Diagnosis not present

## 2014-05-11 NOTE — Progress Notes (Signed)
  Sabrina Tapia - 63 y.o. female MRN [id number]  Date of birth: 04/08/1951  CC & HPI:  The patient presents as new patient for evaluation of: Bilateral Foot Pain: Right foot with longstanding pain worsened over the past 4-5 years.  Pain in lateral metatarsal area, 1st MTP, and PF insertion.  Has tried OTC arch support with some improvement in PF symptoms. No therEX. Was started on NSAIDs by outside provider with some improvement. No numbness tingling or weakness.  No prior trauma.   Left foot with posterior ankle pain present for 4-5 weaks after acute "tweak" while skipping down a hill playing golf.  Unable to finish round; no fall or significant bruising. Steadily improving; able to play.   ROS:  Per HPI.   HISTORY: Past Medical, Surgical, Social, and Family History Reviewed & Updated per EMR.  Pertinent Historical Findings include: No prior foot surgeries; mother and female relatives with Hx of bunion/claw toe deformity/surgeries. Non-smoker. Avid golfer   DATA REVIEWED: Office visit from Dr. Doran Durand ([place]) - started on NSAID and given claw toe pad  OBJECTIVE FINDINGS:  VS:   HT:_0  (162.6 cm)   WT:147 lb (66.679 kg)  BMI:25.3          BP:130/70 mmHg  HR: bpm  TEMP: ( )  RESP:   PHYSICAL EXAM: GENERAL: Adult caucasian  female. In no discomfort; no respiratory distress   PSYCH: alert and appropriate, good insight   NEURO: Sensation is intact to light touch in B LEs  VASCULAR: DP and PT pulses 2+/4.  No significant edema.    Bilateral Foot EXAM: Appearance: Overall normal appearing at rest. Well maintained long arch with weight bearing; bilateral trans arch breakdown with splay toe between 1st/2nd on right; early bunion on R with lateral callus.   Skin: No overlying erythema/ecchymosis.  Palpation: TTP over: Left peroneals, retrocalc bursa; Right PF insertion  No TTP over: B achilles, metatarsals  Strength & ROM: 5/5 Strength: Bilateral ankle intrinsics with exception as  below  Ankle Dorsiflexion with straight knee: R to 90o; L to 105o; bent knee R to 110; L 115  Special Tests: Stable Ankle drawer and talar tilt bilaterally; negative metatarsal squeeze test.     Limited MSK Ultrasound of Bilateral Feet: Findings: Left retrocalcaneal hypoechoic change with achilles intact, small amount of fluid around insertion of plantaris; left peroneals with moderate halo sign; tendons intact.  PF = 32m on right; 378mon left  Impression: The above findings are consistent with Right Plantar Fasciitis; left retrocalcaneal bursitis     ASSESSMENT: 1. Pain in joint, ankle and foot, left   2. Pain in joint, ankle and foot, right   3. Plantar fasciitis, right   4. Retrocalcaneal bursitis, left    Constellation of symptoms both chronic and acute. Underlying transverse arch breakdown  PLAN: See problem based charting & AVS for additional documentation. - HEP with eccentric achilles/calf/PF exercises in both straight leg and bent knee position - aspercream to left achilles region; pt resistant to scheduled NSAIDs - small met pads added to OTC arch support. To be worn majority of time. Pt reported improvement in sx after placing these > Return in about 6 weeks (around 06/22/2014) for consideration of custom orthotics.

## 2014-06-14 ENCOUNTER — Encounter: Payer: Self-pay | Admitting: Sports Medicine

## 2014-06-14 ENCOUNTER — Ambulatory Visit (INDEPENDENT_AMBULATORY_CARE_PROVIDER_SITE_OTHER): Payer: BC Managed Care – PPO | Admitting: Sports Medicine

## 2014-06-14 VITALS — BP 117/70 | Ht 64.0 in | Wt 145.0 lb

## 2014-06-14 DIAGNOSIS — M722 Plantar fascial fibromatosis: Secondary | ICD-10-CM

## 2014-06-14 DIAGNOSIS — M7752 Other enthesopathy of left foot: Secondary | ICD-10-CM | POA: Diagnosis not present

## 2014-06-14 NOTE — Assessment & Plan Note (Signed)
Improving. Custom orthotics crafted today. -Follow-up if needed.

## 2014-06-14 NOTE — Progress Notes (Signed)
   Subjective:    Patient ID: Sabrina Tapia, female    DOB: Nov 13, 1950, 63 y.o.   MRN: [id number]  HPI Ms. Sabrina Tapia is a 63 year old female who presents for follow-up of bilateral foot pain. She has had chronic foot pain without any known acute injury. She notes that her symptoms are significantly improved with the modifications to her insole that were done last month. Her right plantar fasciitis has improved, and her left retrocalcaneal bursitis is also improved. She occasionally does note some pain through the right first toe that is sharp and burning in character. She denies any associated swelling, fever, or chills. She has tried over-the-counter anti-inflammatory medications in the past. She also enjoys golfing.  Past medical history, social history, medications, and allergies were reviewed and are up to date in the chart.  Review of Systems 7 point review of systems was performed and was otherwise negative unless noted in the history of present illness.     Objective:   Physical Exam BP 117/70 mmHg  Ht 5\' 4"  (1.626 m)  Wt 145 lb (65.772 kg)  BMI 24.88 kg/m2 GEN: The patient is well-developed well-nourished female and in no acute distress.  She is awake alert and oriented x3. SKIN: warm and well-perfused, no rash  EXTR: No lower extremity edema or calf tenderness Neuro: Strength 5/5 globally. Sensation intact throughout. No focal deficits. Vasc: +2 bilateral distal pulses. No edema.  MSK:  Standing structural exam of the bilateral feet reveals a slight splaying of the first and second toes on the left. The right foot has a mild bunion deformity and hallux rigidus. She has second and third toe dominance on the right. She is minimally tender over the right plantar fascia insertion and left retrocalcaneal bursa. She has no Achilles pain. She has fairly good strength and otherwise full range of motion. Gait analysis reveals a fairly neutral gait.  Orthotics: Patient was fitted for a :  standard, cushioned, semi-rigid orthotic.  The orthotic was heated and afterward the patient stood on the orthotic blank positioned on the orthotic stand.  The patient was positioned in subtalar neutral position and 10 degrees of ankle dorsiflexion in a weight bearing stance.  After completion of molding, a stable base was applied to the orthotic blank.  The blank was ground to a stable position for weight bearing.  Size: 7 Base: Thin red foam Posting: right 1st ray post, right MT pad Additional orthotic padding: none     Assessment & Plan:  Please see problem based assessment and plan in the problem list. Greater than 50% of this 40 minute visit was spent face-to-face time with the patient analyzing her gait, crafting custom orthotics, fitting, and modifications with the patient.

## 2014-07-28 DIAGNOSIS — R32 Unspecified urinary incontinence: Secondary | ICD-10-CM | POA: Insufficient documentation

## 2014-08-22 ENCOUNTER — Telehealth: Payer: Self-pay

## 2014-08-22 MED ORDER — SOLIFENACIN SUCCINATE 5 MG PO TABS: 5.0000 mg | ORAL_TABLET | Freq: Every day | ORAL | Status: DC

## 2014-08-22 NOTE — Telephone Encounter (Signed)
Rx sent. Patient informed. 

## 2014-08-22 NOTE — Telephone Encounter (Signed)
Vesicare 5 mg tablet #30 one by mouth daily refill through April 2016 when due for annual exam

## 2014-08-22 NOTE — Telephone Encounter (Signed)
Patient said last time she was in she discussed with you if you would be willing to prescribe her Vesicare 5 mg  when her Rx that Dr. Matilde Sprang had prescribed ran out.  She said you told her you would be happy to.  She said she is almost out and needs Rx. Ok to send Rx for her?

## 2014-10-21 ENCOUNTER — Other Ambulatory Visit: Payer: Self-pay | Admitting: Gynecology

## 2014-10-31 ENCOUNTER — Encounter: Payer: BC Managed Care – PPO | Admitting: Gynecology

## 2014-11-20 ENCOUNTER — Telehealth: Payer: Self-pay | Admitting: *Deleted

## 2014-11-20 NOTE — Telephone Encounter (Signed)
Okay for order for diagnostic bilateral mammography secondary to history of dense breasts.

## 2014-11-20 NOTE — Telephone Encounter (Signed)
Pt called requesting to have diag. Mammogram order sent to [facility], last one was 06/2013. States has history of dense breast and breast aspiration. Please advise

## 2014-11-20 NOTE — Telephone Encounter (Signed)
Order faxed.

## 2014-11-21 ENCOUNTER — Encounter: Payer: Self-pay | Admitting: Sports Medicine

## 2014-11-21 ENCOUNTER — Ambulatory Visit (INDEPENDENT_AMBULATORY_CARE_PROVIDER_SITE_OTHER): Payer: BC Managed Care – PPO | Admitting: Sports Medicine

## 2014-11-21 VITALS — BP 135/71 | HR 78 | Ht 64.0 in | Wt 144.0 lb

## 2014-11-21 DIAGNOSIS — M7751 Other enthesopathy of right foot: Secondary | ICD-10-CM | POA: Diagnosis not present

## 2014-11-21 NOTE — Patient Instructions (Signed)
Try heel lifts on orthotics Peel off if you don't like them  Use heel cup in golf shoes Ice at the end of a round because you still get swelling  Change your heel lifts to the following 1lb 3lb 10 lb  Sets of 8 - one with each Do these lifts on a flat Try to do 2 x day when you have not walked a lot  End of July recheck

## 2014-11-21 NOTE — Progress Notes (Signed)
Patient ID: Sabrina Tapia, female   DOB: 1951/05/28, 64 y.o.   MRN: [id number]  Patient comes in followup of the right Achilles issue and retrocalcaneal bursitis Orthotics have relieved her pain enough to where she walks and does cardiac activity with minimal pain Golf is still painful but less over the tendon then over the calcaneus  She is felt tight so she went up one size to be able to accommodate orthotics  She still has occasional sharp pains primarily when walking up hills on the golf course  Pilates is okay Walking is okay  She thinks the swelling behind the right ankle is less  Examination No acute distress BP 135/71 mmHg  Pulse 78  Ht 5\' 4"  (1.626 m)  Wt 144 lb (65.318 kg)  BMI 24.71 kg/m2  Orthotics look to be in good repair Rt Ankle: No visible erythema or swelling. Range of motion is full in all directions. Strength is 5/5 in all directions. Stable lateral and medial ligaments; squeeze test and kleiger test unremarkable; Talar dome nontender; No pain at base of 5th MT; No tenderness over cuboid; No tenderness over N spot or navicular prominence No tenderness on posterior aspects of lateral and medial malleolus No sign of peroneal tendon subluxations; Negative tarsal tunnel tinel's Able to walk 4 steps.  Ultrasound The Achilles tendon that was normal and is normal with The retrocalcaneal bursa is smaller There is slight swelling at the insertion of the Achilles on the deep surface that tracks into the bursal No abnormality of the calcaneus

## 2014-11-21 NOTE — Assessment & Plan Note (Signed)
This is improved and there is no Achilles tendon issue  I think the biggest he will be to modify her golf shoes  I added some additional heel pads to her orthotics to give her more support  I changed her to concentric heel lifts  I gave her soft heel cups to try and her golf shoes  We will recheck this in July

## 2014-11-23 DIAGNOSIS — K649 Unspecified hemorrhoids: Secondary | ICD-10-CM | POA: Insufficient documentation

## 2014-11-23 DIAGNOSIS — K644 Residual hemorrhoidal skin tags: Secondary | ICD-10-CM | POA: Insufficient documentation

## 2014-11-23 DIAGNOSIS — K6289 Other specified diseases of anus and rectum: Secondary | ICD-10-CM | POA: Insufficient documentation

## 2014-12-11 ENCOUNTER — Ambulatory Visit (INDEPENDENT_AMBULATORY_CARE_PROVIDER_SITE_OTHER): Payer: BC Managed Care – PPO | Admitting: Gynecology

## 2014-12-11 ENCOUNTER — Other Ambulatory Visit (HOSPITAL_COMMUNITY)
Admission: RE | Admit: 2014-12-11 | Discharge: 2014-12-11 | Disposition: A | Discharge status: Home / Self Care | Payer: BC Managed Care – PPO | Source: Ambulatory Visit | Attending: Gynecology | Admitting: Gynecology

## 2014-12-11 ENCOUNTER — Encounter: Payer: Self-pay | Admitting: Gynecology

## 2014-12-11 VITALS — BP 122/84 | Ht 64.0 in | Wt 145.0 lb

## 2014-12-11 DIAGNOSIS — N3941 Urge incontinence: Secondary | ICD-10-CM

## 2014-12-11 DIAGNOSIS — Z79890 Hormone replacement therapy (postmenopausal): Secondary | ICD-10-CM | POA: Diagnosis not present

## 2014-12-11 DIAGNOSIS — Z01419 Encounter for gynecological examination (general) (routine) without abnormal findings: Secondary | ICD-10-CM | POA: Diagnosis not present

## 2014-12-11 DIAGNOSIS — N952 Postmenopausal atrophic vaginitis: Secondary | ICD-10-CM

## 2014-12-11 MED ORDER — MIRABEGRON ER 25 MG PO TB24: 25.0000 mg | ORAL_TABLET | Freq: Every day | ORAL | Status: DC

## 2014-12-11 MED ORDER — CONJ ESTROG-MEDROXYPROGEST ACE 0.45-1.5 MG PO TABS: 1.0000 | ORAL_TABLET | Freq: Every day | ORAL | Status: DC

## 2014-12-11 NOTE — Progress Notes (Signed)
Sabrina Tapia 1951-05-14 [id number]        64 y.o.  G0P0 for annual exam.  Several issues noted below.  Past medical history,surgical history, problem list, medications, allergies, family history and social history were all reviewed and documented as reviewed in the EPIC chart.  ROS:  Performed with pertinent positives and negatives included in the history, assessment and plan.   Additional significant findings :  none   Exam: [name] Counsellor Vitals:   12/11/14 1436  BP: 122/84  Height: 5\' 4"  (1.626 m)  Weight: 145 lb (65.772 kg)   General appearance:  Normal affect, orientation and appearance. Skin: Grossly normal HEENT: Without gross lesions.  No cervical or supraclavicular adenopathy. Thyroid normal.  Lungs:  Clear without wheezing, rales or rhonchi Cardiac: RR, without RMG Abdominal:  Soft, nontender, without masses, guarding, rebound, organomegaly or hernia Breasts:  Examined lying and sitting without masses, retractions, discharge or axillary adenopathy. Pelvic:  Ext/BUS/vagina with atrophic changes  Cervix with atrophic changes. Pap smear done  Uterus axial to anteverted, normal size, shape and contour, midline and mobile nontender   Adnexa  Without masses or tenderness    Anus and perineum  Normal   Rectovaginal  Normal sphincter tone without palpated masses or tenderness.    Assessment/Plan:  64 y.o. G0P0 female for annual exam.   1. Postmenopausal/HRT. Patient continues on Prempro 0.45/1.5. Doing well wants to continue. No history of any vaginal bleeding. I again reviewed the whole issue of HRT and risks to include the WHI study with increased risk of stroke heart attack DVT breast cancer. Lowest dose per shortest period of time reviewed. Patient has tried weaning and felt much better on this medication wants to continue. She clearly understands the risks. Refill 1 year provided. 2. Urgency incontinence. On Vesicare 5 mg. Having some constipation with this. Asked  about alternative indications. Reviewed possible trial of Myrbetriq. Patient wants to do this and I wrote her for 25 mg tablets 2 refills. She'll call me after 1-2 months to see how she is doing and possible adjustment of dose as needed. Possible increased risk of hypertension associated with this need to recheck her blood pressure after being on the medication for several weeks discussed. 3. Osteopenia. DEXA 11/2013 T score -2.1. FRAX 7.7%/0.7%. Stable from prior DEXA. Plan repeat DEXA next year or 2 year interval. Increase calcium vitamin D reviewed. 4. Colonoscopy scheduled tomorrow. 5. Pap smear 2013. Pap smear done today.  History of LGSIL 2001 with normal Pap smears since then. 6. Mammography 11/2013. Continue with annual mammography. SBE monthly reviewed. 7. Health maintenance. No routine blood work done as patient has this done at her primary physician's office. Follow up in one year, sooner as needed.     Anastasio Auerbach MD, 3:35 PM 12/11/2014

## 2014-12-11 NOTE — Patient Instructions (Signed)
Call me after the first month or so of Myrbetriq  You may obtain a copy of any labs that were done today by logging onto MyChart as outlined in the instructions provided with your AVS (after visit summary). The office will not call with normal lab results but certainly if there are any significant abnormalities then we will contact you.   Health Maintenance, Female A healthy lifestyle and preventative care can promote health and wellness.  Maintain regular health, dental, and eye exams.  Eat a healthy diet. Foods like vegetables, fruits, whole grains, low-fat dairy products, and lean protein foods contain the nutrients you need without too many calories. Decrease your intake of foods high in solid fats, added sugars, and salt. Get information about a proper diet from your caregiver, if necessary.  Regular physical exercise is one of the most important things you can do for your health. Most adults should get at least 150 minutes of moderate-intensity exercise (any activity that increases your heart rate and causes you to sweat) each week. In addition, most adults need muscle-strengthening exercises on 2 or more days a week.   Maintain a healthy weight. The body mass index (BMI) is a screening tool to identify possible weight problems. It provides an estimate of body fat based on height and weight. Your caregiver can help determine your BMI, and can help you achieve or maintain a healthy weight. For adults 20 years and older:  A BMI below 18.5 is considered underweight.  A BMI of 18.5 to 24.9 is normal.  A BMI of 25 to 29.9 is considered overweight.  A BMI of 30 and above is considered obese.  Maintain normal blood lipids and cholesterol by exercising and minimizing your intake of saturated fat. Eat a balanced diet with plenty of fruits and vegetables. Blood tests for lipids and cholesterol should begin at age 40 and be repeated every 5 years. If your lipid or cholesterol levels are high, you  are over 50, or you are a high risk for heart disease, you may need your cholesterol levels checked more frequently.Ongoing high lipid and cholesterol levels should be treated with medicines if diet and exercise are not effective.  If you smoke, find out from your caregiver how to quit. If you do not use tobacco, do not start.  Lung cancer screening is recommended for adults aged 37 80 years who are at high risk for developing lung cancer because of a history of smoking. Yearly low-dose computed tomography (CT) is recommended for people who have at least a 30-pack-year history of smoking and are a current smoker or have quit within the past 15 years. A pack year of smoking is smoking an average of 1 pack of cigarettes a day for 1 year (for example: 1 pack a day for 30 years or 2 packs a day for 15 years). Yearly screening should continue until the smoker has stopped smoking for at least 15 years. Yearly screening should also be stopped for people who develop a health problem that would prevent them from having lung cancer treatment.  If you are pregnant, do not drink alcohol. If you are breastfeeding, be very cautious about drinking alcohol. If you are not pregnant and choose to drink alcohol, do not exceed 1 drink per day. One drink is considered to be 12 ounces (355 mL) of beer, 5 ounces (148 mL) of wine, or 1.5 ounces (44 mL) of liquor.  Avoid use of street drugs. Do not share needles with anyone.  for help if you need support or instructions about stopping the use of drugs.  High blood pressure causes heart disease and increases the risk of stroke. Blood pressure should be checked at least every 1 to 2 years. Ongoing high blood pressure should be treated with medicines, if weight loss and exercise are not effective.  If you are 55 to 63 years old, ask your caregiver if you should take aspirin to prevent strokes.  Diabetes screening involves taking a blood sample to check your fasting blood sugar  level. This should be done once every 3 years, after age 45, if you are within normal weight and without risk factors for diabetes. Testing should be considered at a younger age or be carried out more frequently if you are overweight and have at least 1 risk factor for diabetes.  Breast cancer screening is essential preventative care for women. You should practice "breast self-awareness." This means understanding the normal appearance and feel of your breasts and may include breast self-examination. Any changes detected, no matter how small, should be reported to a caregiver. Women in their 20s and 30s should have a clinical breast exam (CBE) by a caregiver as part of a regular health exam every 1 to 3 years. After age 40, women should have a CBE every year. Starting at age 40, women should consider having a mammogram (breast X-ray) every year. Women who have a family history of breast cancer should talk to their caregiver about genetic screening. Women at a high risk of breast cancer should talk to their caregiver about having an MRI and a mammogram every year.  Breast cancer gene (BRCA)-related cancer risk assessment is recommended for women who have family members with BRCA-related cancers. BRCA-related cancers include breast, ovarian, tubal, and peritoneal cancers. Having family members with these cancers may be associated with an increased risk for harmful changes (mutations) in the breast cancer genes BRCA1 and BRCA2. Results of the assessment will determine the need for genetic counseling and BRCA1 and BRCA2 testing.  The Pap test is a screening test for cervical cancer. Women should have a Pap test starting at age 21. Between ages 21 and 29, Pap tests should be repeated every 2 years. Beginning at age 30, you should have a Pap test every 3 years as long as the past 3 Pap tests have been normal. If you had a hysterectomy for a problem that was not cancer or a condition that could lead to cancer, then  you no longer need Pap tests. If you are between ages 65 and 70, and you have had normal Pap tests going back 10 years, you no longer need Pap tests. If you have had past treatment for cervical cancer or a condition that could lead to cancer, you need Pap tests and screening for cancer for at least 20 years after your treatment. If Pap tests have been discontinued, risk factors (such as a new sexual partner) need to be reassessed to determine if screening should be resumed. Some women have medical problems that increase the chance of getting cervical cancer. In these cases, your caregiver may recommend more frequent screening and Pap tests.  The human papillomavirus (HPV) test is an additional test that may be used for cervical cancer screening. The HPV test looks for the virus that can cause the cell changes on the cervix. The cells collected during the Pap test can be tested for HPV. The HPV test could be used to screen women aged 30 years and   and older, and should be used in women of any age who have unclear Pap test results. After the age of 49, women should have HPV testing at the same frequency as a Pap test.  Colorectal cancer can be detected and often prevented. Most routine colorectal cancer screening begins at the age of 59 and continues through age 59. However, your caregiver may recommend screening at an earlier age if you have risk factors for colon cancer. On a yearly basis, your caregiver may provide home test kits to check for hidden blood in the stool. Use of a small camera at the end of a tube, to directly examine the colon (sigmoidoscopy or colonoscopy), can detect the earliest forms of colorectal cancer. Talk to your caregiver about this at age 20, when routine screening begins. Direct examination of the colon should be repeated every 5 to 10 years through age 32, unless early forms of pre-cancerous polyps or small growths are found.  Hepatitis C blood testing is recommended for all people  born from 29 through 1965 and any individual with known risks for hepatitis C.  Practice safe sex. Use condoms and avoid high-risk sexual practices to reduce the spread of sexually transmitted infections (STIs). Sexually active women aged 28 and younger should be checked for Chlamydia, which is a common sexually transmitted infection. Older women with new or multiple partners should also be tested for Chlamydia. Testing for other STIs is recommended if you are sexually active and at increased risk.  Osteoporosis is a disease in which the bones lose minerals and strength with aging. This can result in serious bone fractures. The risk of osteoporosis can be identified using a bone density scan. Women ages 59 and over and women at risk for fractures or osteoporosis should discuss screening with their caregivers. Ask your caregiver whether you should be taking a calcium supplement or vitamin D to reduce the rate of osteoporosis.  Menopause can be associated with physical symptoms and risks. Hormone replacement therapy is available to decrease symptoms and risks. You should talk to your caregiver about whether hormone replacement therapy is right for you.  Use sunscreen. Apply sunscreen liberally and repeatedly throughout the day. You should seek shade when your shadow is shorter than you. Protect yourself by wearing long sleeves, pants, a wide-brimmed hat, and sunglasses year round, whenever you are outdoors.  Notify your caregiver of new moles or changes in moles, especially if there is a change in shape or color. Also notify your caregiver if a mole is larger than the size of a pencil eraser.  Stay current with your immunizations. Document Released: 01/27/2011 Document Revised: 11/08/2012 Document Reviewed: 01/27/2011 [facility] Patient Information 2014 [place].

## 2014-12-11 NOTE — Addendum Note (Signed)
Addended by: Nelva Nay on: 12/11/2014 04:11 PM   Modules accepted: Orders

## 2014-12-12 ENCOUNTER — Encounter: Payer: Self-pay | Admitting: Gynecology

## 2014-12-12 LAB — URINALYSIS W MICROSCOPIC + REFLEX CULTURE
BACTERIA UA: NONE SEEN
BILIRUBIN URINE: NEGATIVE
Casts: NONE SEEN
Crystals: NONE SEEN
Glucose, UA: NEGATIVE mg/dL
HGB URINE DIPSTICK: NEGATIVE
KETONES UR: NEGATIVE mg/dL
Nitrite: NEGATIVE
PROTEIN: NEGATIVE mg/dL
Specific Gravity, Urine: 1.006 (ref 1.005–1.030)
UROBILINOGEN UA: 0.2 mg/dL (ref 0.0–1.0)
pH: 6.5 (ref 5.0–8.0)

## 2014-12-13 LAB — CYTOLOGY - PAP

## 2014-12-14 ENCOUNTER — Other Ambulatory Visit: Payer: Self-pay | Admitting: Gynecology

## 2014-12-14 LAB — URINE CULTURE: Colony Count: 75000

## 2014-12-14 MED ORDER — AMPICILLIN 500 MG PO CAPS: 500.0000 mg | ORAL_CAPSULE | Freq: Four times a day (QID) | ORAL | Status: DC

## 2014-12-19 ENCOUNTER — Encounter: Payer: Self-pay | Admitting: Gynecology

## 2015-02-22 ENCOUNTER — Ambulatory Visit: Payer: BC Managed Care – PPO | Admitting: Sports Medicine

## 2015-02-23 ENCOUNTER — Other Ambulatory Visit: Payer: Self-pay

## 2015-02-23 MED ORDER — SOLIFENACIN SUCCINATE 5 MG PO TABS: 5.0000 mg | ORAL_TABLET | Freq: Every day | ORAL | Status: DC

## 2015-04-03 ENCOUNTER — Ambulatory Visit: Payer: BC Managed Care – PPO | Admitting: Sports Medicine

## 2015-04-04 ENCOUNTER — Ambulatory Visit (INDEPENDENT_AMBULATORY_CARE_PROVIDER_SITE_OTHER): Payer: BC Managed Care – PPO | Admitting: Sports Medicine

## 2015-04-04 ENCOUNTER — Encounter: Payer: Self-pay | Admitting: Sports Medicine

## 2015-04-04 VITALS — BP 119/76 | HR 62 | Ht 64.0 in | Wt 145.0 lb

## 2015-04-04 DIAGNOSIS — M21611 Bunion of right foot: Secondary | ICD-10-CM

## 2015-04-04 DIAGNOSIS — M2011 Hallux valgus (acquired), right foot: Secondary | ICD-10-CM | POA: Diagnosis not present

## 2015-04-04 DIAGNOSIS — M7752 Other enthesopathy of left foot: Secondary | ICD-10-CM

## 2015-04-04 NOTE — Progress Notes (Signed)
[facility] Aquilla Hacker, MD Phone: [phone]  Subjective:   # Follow Up Foot pain - previously diagnosed with retrocalcaneal bursitis on the right. This has resolved. She has a very little discomfort remaining, but overall no more pain.  - She now has some discomfort over her bunyon on the right foot.  - She otherwise, has no complaints.  - She is about to go to [place] for a golf trip, and would like something for her bunyon prior to her trip.  - No ankle pain.  - No other complaints.   All relevant systems were reviewed and were negative unless otherwise noted in the HPI  Past Medical History Reviewed problem list.  Medications- reviewed and updated Current Outpatient Prescriptions  Medication Sig Dispense Refill  . ALPRAZolam (XANAX) 0.25 MG tablet TAKE 1 TABLET BY MOUTH AT BEDTIME AS NEEDED FOR SLEEP 30 tablet 0  . ampicillin (PRINCIPEN) 500 MG capsule Take 1 capsule (500 mg total) by mouth 4 (four) times daily. 12 capsule 0  . aspirin 81 MG tablet Take 81 mg by mouth daily.    . Calcium Carbonate-Vitamin D (CALCIUM + D PO) Take by mouth.    . cetirizine (ZYRTEC) 10 MG tablet Take 10 mg by mouth.    . docusate sodium (COLACE) 50 MG capsule Take 50 mg by mouth.    . estrogen, conjugated,-medroxyprogesterone (PREMPRO) 0.45-1.5 MG per tablet Take 1 tablet by mouth daily. 28 tablet 12  . fluticasone (FLONASE) 50 MCG/ACT nasal spray     . hydrocortisone (ANUSOL-HC) 2.5 % rectal cream Place 1 application rectally 4 (four) times daily. 30 g 0  . hydrocortisone (ANUSOL-HC) 25 MG suppository UNWRAP AND INSERT 1 SUPPOSITORY RECTALLY 2 TIMES DAILY.    Marland Kitchen lidocaine (XYLOCAINE) 5 % ointment Apply 1 application topically as needed. 35.44 g 0  . LUTEIN PO Take by mouth.    . mirabegron ER (MYRBETRIQ) 25 MG TB24 tablet Take 1 tablet (25 mg total) by mouth daily. 30 tablet 2  . Multiple Vitamin (MULTIVITAMIN) tablet Take 1 tablet by mouth daily.    . Omega-3  Fatty Acids (FISH OIL PO) Take by mouth.    . Omeprazole (PRILOSEC PO) Take by mouth.    Marland Kitchen OVER THE COUNTER MEDICATION     . polyethylene glycol (MIRALAX / GLYCOLAX) packet Take 17 g by mouth daily.    Marland Kitchen PRILOSEC OTC 20 MG tablet     . PROAIR HFA 108 (90 BASE) MCG/ACT inhaler     . Probiotic Product (PROBIOTIC DAILY PO) Take by mouth.    Marland Kitchen QVAR 40 MCG/ACT inhaler     . QVAR 80 MCG/ACT inhaler     . solifenacin (VESICARE) 5 MG tablet Take 1 tablet (5 mg total) by mouth daily. 30 tablet 11  . Turmeric 450 MG CAPS Take by mouth.     No current facility-administered medications for this visit.   Chief complaint-noted No additions to family history Social history- patient is a non smoker  Objective: BP 119/76 mmHg  Pulse 62  Ht 5\' 4"  (1.626 m)  Wt 145 lb (65.772 kg)  BMI 24.88 kg/m2  INSPECTION: Feet with some loss of arch upon standing, normal gait, no gross deformity.  PALPATION: No pain, no laxity of the ankle on either side, minimal tenderness over the achiles insertion on the left, No tenderness over the bunyon on the right. Squeeze test negative on the right.  RANGE OF MOTION: 30 degrees of ROM  of the first MTP on the right, with 70 degrees ROM on the left first MTP.   STRENGTH: 5/5 with dorsiflexion/ plantarflexion.  SPECIAL TESTS: None NEUROVASCULAR STATUS: fully in tact.    Assessment/Plan:  # Foot Pain  - Left retrocalcaneal tendonitis mostly resolved. Her primary issue now is foot comfort with the bunion on her right foot. She has a custom orthotic already that has helped tremendously. Will benefit from bunion toe cushion - Continue to play golf as you have been - Continue orthotic use - ibuprofen / ice prn - Use the bunyon insert to relieve pressure off of the right first mtp bunyon - Follow up as needed.  - Letter given for golf buggy use in [place].

## 2015-04-04 NOTE — Assessment & Plan Note (Signed)
All sx's resolved 

## 2015-07-29 DIAGNOSIS — Z853 Personal history of malignant neoplasm of breast: Secondary | ICD-10-CM

## 2015-07-29 HISTORY — DX: Personal history of malignant neoplasm of breast: Z85.3

## 2015-08-09 ENCOUNTER — Other Ambulatory Visit: Payer: Self-pay | Admitting: Allergy and Immunology

## 2015-10-15 ENCOUNTER — Other Ambulatory Visit: Payer: Self-pay | Admitting: Allergy and Immunology

## 2015-11-19 ENCOUNTER — Other Ambulatory Visit: Payer: Self-pay | Admitting: Allergy and Immunology

## 2015-11-20 DIAGNOSIS — K219 Gastro-esophageal reflux disease without esophagitis: Secondary | ICD-10-CM | POA: Insufficient documentation

## 2015-11-21 ENCOUNTER — Other Ambulatory Visit: Payer: Self-pay

## 2015-11-21 ENCOUNTER — Telehealth: Payer: Self-pay | Admitting: Allergy and Immunology

## 2015-11-21 MED ORDER — BECLOMETHASONE DIPROPIONATE 40 MCG/ACT IN AERS: INHALATION_SPRAY | RESPIRATORY_TRACT | Status: DC

## 2015-11-21 NOTE — Telephone Encounter (Signed)
Pt called to see if we would give her a refill on qvar 40 until she comes in on may 19,2017 to see dr hicks. [facility]

## 2015-11-21 NOTE — Telephone Encounter (Signed)
SENT IN JUST 1 REFILL

## 2015-12-14 ENCOUNTER — Ambulatory Visit (INDEPENDENT_AMBULATORY_CARE_PROVIDER_SITE_OTHER): Payer: BC Managed Care – PPO | Admitting: Allergy and Immunology

## 2015-12-14 ENCOUNTER — Encounter: Payer: Self-pay | Admitting: Allergy and Immunology

## 2015-12-14 VITALS — BP 104/64 | HR 60 | Temp 98.1°F | Resp 16

## 2015-12-14 DIAGNOSIS — R05 Cough: Secondary | ICD-10-CM

## 2015-12-14 DIAGNOSIS — R059 Cough, unspecified: Secondary | ICD-10-CM

## 2015-12-14 DIAGNOSIS — H1010 Acute atopic conjunctivitis, unspecified eye: Secondary | ICD-10-CM | POA: Diagnosis not present

## 2015-12-14 DIAGNOSIS — J309 Allergic rhinitis, unspecified: Secondary | ICD-10-CM

## 2015-12-14 DIAGNOSIS — R062 Wheezing: Secondary | ICD-10-CM

## 2015-12-14 MED ORDER — FLUTICASONE PROPIONATE 50 MCG/ACT NA SUSP: 2.0000 | Freq: Every day | NASAL | Status: DC

## 2015-12-14 MED ORDER — ALBUTEROL SULFATE HFA 108 (90 BASE) MCG/ACT IN AERS: 2.0000 | INHALATION_SPRAY | RESPIRATORY_TRACT | Status: DC | PRN

## 2015-12-14 NOTE — Progress Notes (Signed)
FOLLOW UP NOTE  RE: Sabrina Tapia MRN: [id number] DOB: 04-15-1951 [facility] [place] [address] [place] [address] Date of Office Visit: 12/14/2015  Subjective:  Sabrina Tapia is a 65 y.o. female who presents today for Cough  Assessment:   1. History of cough, probable asthma, adult onset.    2. Previous report of wheeze.   3. Allergic rhinoconjunctivitis.    Plan:   Meds ordered this encounter  Medications  . fluticasone (FLONASE) 50 MCG/ACT nasal spray    Sig: Place 2 sprays into both nostrils daily.    Dispense:  16 g    Refill:  5  . albuterol (PROAIR HFA) 108 (90 Base) MCG/ACT inhaler    Sig: Inhale 2 puffs into the lungs every 4 (four) hours as needed for wheezing or shortness of breath.    Dispense:  1 Inhaler    Refill:  1    Place on hold patient will call  1.   Use QVAR 5mcg 1 puff twice daily (sample) and call with update in the next 3-4 weeks. 2.   Continue  Flonase and Zyrtec. 3.   Saline nasal wash each evening at shower time (especially after outdoor/golf time). 4.   Review information on allergy injections. 5.   ProAir as needed--call with any recurring use. 6.   Follow-up in office in 4-6 months (depending on whether begins immunotherapy).  HPI: Sabrina Tapia returns to the office requesting medication refill and follow-up of allergic rhinitis, cough and previous report of wheeze.  Since her last visit in September 2016, she feels maintenance medications are definitely been beneficial, we increased to Qvar 80 and used Dymista, which she thought was beneficial and during less symptomatic seasonal return to [facility] and Qvar 40.   With the heavier spring pollen weather and playing golf, she did use her Pro Air once or twice in April when increasing rhinorrhea, congestion, cough, wheeze which did not persist after albuterol use.  She feels her sleep is pretty good, though occasionally still notes the late evening throat  clearing cough.  Denies any persisting wheeze, difficulty breathing or shortness of breath and maintaining on medications year-round has definitely been beneficial.  She feels good today.  Reports reflux is well controlled.  Denies any additional concerns or new medical issues.  Denies ED or urgent care visits, prednisone or antibiotic courses. Reports sleep and activity are normal.  Sabrina Tapia has a current medication list which includes the following prescription(s): albuterol, aspirin, beclomethasone, calcium citrate-vitamin d, cetirizine, docusate sodium, estrogen (conjugated)-medroxyprogesterone, fluticasone, hydrocortisone, hydrocortisone, lidocaine, lutein, mirabegron er, multivitamin, omega-3 fatty acids, omeprazole, polyethylene glycol, probiotic product, solifenacin, turmeric.   Drug Allergies: No Known Allergies  Objective:   Filed Vitals:   12/14/15 1022  BP: 104/64  Pulse: 60  Temp: 98.1 F (36.7 C)  Resp: 16   SpO2 Readings from Last 1 Encounters:  12/14/15 97%   Physical Exam  Constitutional: She is well-developed, well-nourished, and in no distress.  Occasional throat clearing during conversation.  HENT:  Head: Atraumatic.  Right Ear: Tympanic membrane and ear canal normal.  Left Ear: Tympanic membrane and ear canal normal.  Nose: Mucosal edema present. No rhinorrhea. No epistaxis.  Mouth/Throat: Oropharynx is clear and moist and mucous membranes are normal. No oropharyngeal exudate, posterior oropharyngeal edema or posterior oropharyngeal erythema.  Neck: Neck supple.  Cardiovascular: Normal rate, S1 normal and S2 normal.   No murmur heard. Pulmonary/Chest: Effort normal. She has  no wheezes. She has no rhonchi. She has no rales.  Lymphadenopathy:    She has no cervical adenopathy.   Diagnostics: Spirometry:  FVC 3.09--104%, FEV1 2.56--108%.    Rise M. Ishmael Holter, MD  cc: Dorian Heckle, MD

## 2015-12-14 NOTE — Patient Instructions (Addendum)
   Use QVAR 71mcg 1 puff twice daily (sample) and call with update in the next 3-4 weeks.  Continue  Flonase and Zyrtec.  Saline nasal wash each evening at shower time (especially after outdoor/golf time).  Review information on allergy injections.  ProAir as needed--call with any recurring use.  Follow-up in office in 4-6 months (depending on whether begins immunotherapy).

## 2015-12-21 ENCOUNTER — Other Ambulatory Visit: Payer: Self-pay | Admitting: Gynecology

## 2015-12-25 ENCOUNTER — Ambulatory Visit (INDEPENDENT_AMBULATORY_CARE_PROVIDER_SITE_OTHER): Payer: BC Managed Care – PPO | Admitting: Sports Medicine

## 2015-12-25 ENCOUNTER — Encounter: Payer: Self-pay | Admitting: Sports Medicine

## 2015-12-25 VITALS — Ht 64.0 in | Wt 142.0 lb

## 2015-12-25 DIAGNOSIS — M21611 Bunion of right foot: Secondary | ICD-10-CM

## 2015-12-25 NOTE — Assessment & Plan Note (Addendum)
Orthotics appear to be in good shape. Replaced tarsal pad and re-enforced support today which seemed to help. Patient to f/u in 1 month for another pair of orthotics to be made.   With good result of first pair of orthotics we will extend these to use in almost all shoes.  Will make pair to fit other shoes on RTC.

## 2015-12-25 NOTE — Progress Notes (Signed)
Subjective: CC: fu right foot pain  HPI: Patient is a 65 y.o. female with a past medical history of right retrocalcaneal bursitis and bunion presenting to clinic today for a f/u.  - since last summer she's noticed more pain on the right foot at the site of the bunion. - she's worried her orthotics may be too old (has had them approximately 1.2yrs) - she otherwise, has no complaints.  - she wears the inserts in her golf shoes and tennis shoes, otherwise wears [employer].  - No ankle pain.  - No weakness, numbness, or tingling. Tolerated golfing trip to [place] well.   Left achilles pain- improved with lift of orthotics and KT tape to prevent rubbing.    ROS: All other systems reviewed and are negative.  Past Medical History Patient Active Problem List   Diagnosis Date Noted  . Acid reflux 11/20/2015  . Bunion of great toe of right foot 04/04/2015  . Hemorrhoid 11/23/2014  . Anal skin tag 11/23/2014  . Anal burning 11/23/2014  . Plantar fasciitis, right 05/11/2014  . Retrocalcaneal bursitis 05/11/2014  . Pain in joint, ankle and foot 05/11/2014  . Heart burn   . Joint pain   . Abnormal Pap smear   . Osteopenia     Medications- reviewed and updated Current Outpatient Prescriptions  Medication Sig Dispense Refill  . albuterol (PROAIR HFA) 108 (90 Base) MCG/ACT inhaler Inhale 2 puffs into the lungs every 4 (four) hours as needed for wheezing or shortness of breath. 1 Inhaler 1  . ALPRAZolam (XANAX) 0.25 MG tablet TAKE 1 TABLET BY MOUTH AT BEDTIME AS NEEDED FOR SLEEP (Patient not taking: Reported on 12/14/2015) 30 tablet 0  . aspirin 81 MG tablet Take 81 mg by mouth daily.    . beclomethasone (QVAR) 40 MCG/ACT inhaler INHALE 2 PUFFS BY MOUTH TWICE DAILY TO PREVENT COUGH OR WHEEZE. RINSE, GARGLE, AND SPIT AFTER USE 8.7 g 0  . Calcium Carbonate-Vitamin D (CALCIUM + D PO) Take by mouth.    . cetirizine (ZYRTEC) 10 MG tablet Take 10 mg by mouth.    . docusate sodium (COLACE) 50  MG capsule Take 50 mg by mouth.    . fluticasone (FLONASE) 50 MCG/ACT nasal spray Place 2 sprays into both nostrils daily. 16 g 5  . hydrocortisone (ANUSOL-HC) 2.5 % rectal cream Place 1 application rectally 4 (four) times daily. 30 g 0  . hydrocortisone (ANUSOL-HC) 25 MG suppository UNWRAP AND INSERT 1 SUPPOSITORY RECTALLY 2 TIMES DAILY.    Marland Kitchen lidocaine (XYLOCAINE) 5 % ointment Apply 1 application topically as needed. 35.44 g 0  . LUTEIN PO Take by mouth.    . mirabegron ER (MYRBETRIQ) 25 MG TB24 tablet Take 1 tablet (25 mg total) by mouth daily. 30 tablet 2  . Multiple Vitamin (MULTIVITAMIN) tablet Take 1 tablet by mouth daily.    . Omega-3 Fatty Acids (FISH OIL PO) Take by mouth.    . Omeprazole (PRILOSEC PO) Take by mouth.    Marland Kitchen OVER THE COUNTER MEDICATION     . polyethylene glycol (MIRALAX / GLYCOLAX) packet Take 17 g by mouth daily.    Marland Kitchen PREMPRO 0.45-1.5 MG tablet TAKE 1 TABLET BY MOUTH EVERY DAY 28 tablet 1  . PRILOSEC OTC 20 MG tablet Reported on 12/14/2015    . Probiotic Product (PROBIOTIC DAILY PO) Take by mouth.    . solifenacin (VESICARE) 5 MG tablet Take 1 tablet (5 mg total) by mouth daily. 30 tablet 11  .  Turmeric 450 MG CAPS Take by mouth.     No current facility-administered medications for this visit.    Objective: Office vital signs reviewed. Ht 5\' 4"  (1.626 m)  Wt 142 lb (64.411 kg)  BMI 24.36 kg/m2   Physical Examination:  General: Awake, alert, well- nourished, NAD Orthotics look to be in good repair Right foot: feet with some loss of longitudinal arch with standing. No visible erythema or swelling. No tenderness over the bunion.  Range of motion is full in all directions in the ankle. Decrease medial movement of the 1st MTP on the right compared to the left.  Strength is 5/5.  Stable lateral and medial ligaments; squeeze test unremarkable; Talar dome nontender; No pain at base of 5th MT; No tenderness over cuboid; No tenderness over N spot or navicular prominence.  No tenderness on posterior aspects of lateral and medial malleolus No sign of peroneal tendon subluxations; Negative tarsal tunnel tinel's  Assessment/Plan: Bunion of great toe of right foot Orthotics appear to be in good shape. Replaced tarsal pad and re-enforced support today which seemed to help. Patient to f/u in 1 month for another pair of orthotics to be made.     No orders of the defined types were placed in this encounter.    No orders of the defined types were placed in this encounter.    Archie Patten PGY-2, [facility]  Agree with assessment and plan/ Patient seen with me.   Ila Mcgill, MD

## 2015-12-30 ENCOUNTER — Other Ambulatory Visit: Payer: Self-pay | Admitting: Allergy and Immunology

## 2016-01-16 ENCOUNTER — Ambulatory Visit (INDEPENDENT_AMBULATORY_CARE_PROVIDER_SITE_OTHER): Payer: BC Managed Care – PPO | Admitting: Sports Medicine

## 2016-01-16 ENCOUNTER — Encounter: Payer: Self-pay | Admitting: Sports Medicine

## 2016-01-16 VITALS — BP 138/78 | Ht 64.0 in | Wt 142.0 lb

## 2016-01-16 DIAGNOSIS — M7750 Other enthesopathy of unspecified foot: Secondary | ICD-10-CM | POA: Diagnosis not present

## 2016-01-16 DIAGNOSIS — M722 Plantar fascial fibromatosis: Secondary | ICD-10-CM | POA: Diagnosis not present

## 2016-01-16 DIAGNOSIS — M21611 Bunion of right foot: Secondary | ICD-10-CM | POA: Diagnosis not present

## 2016-01-16 NOTE — Progress Notes (Signed)
Patient ID: Sabrina Tapia, female   DOB: 07-21-1951, 65 y.o.   MRN: [id number]  CC: Bunion pain  Patient is active with golf and walking Put into custom orthotics in 05/2014 2/2 AT and bursal pain Later more pain over RT bunion Orthotics modified with more bunion cushion and this helped  Comes today for additional orthotics that will fit shoes she needs for gold and everyday wear  States that she limits walking unless she uses these  EXAM NAD BP 138/78 mmHg  Ht 5\' 4"  (1.626 m)  Wt 142 lb (64.411 kg)  BMI 24.36 kg/m2  Feet show some early bunion change on RT Splaying bilat between 1 and 2 Hallux rigidus of RT great toe 3rd toe dominant No redness or swelling over bunion Mild loss of long arch  Gait is fairly neutral

## 2016-01-16 NOTE — Assessment & Plan Note (Signed)
This has not flared lately  Cont to add heel cushion

## 2016-01-16 NOTE — Assessment & Plan Note (Signed)
These sxs have resolved as long as she stays w orthotics she gets little heel pain

## 2016-01-16 NOTE — Assessment & Plan Note (Signed)
Patient was fitted for a : standard, cushioned, semi-rigid orthotic. The orthotic was heated and afterward the patient stood on the orthotic blank positioned on the orthotic stand. The patient was positioned in subtalar neutral position and 10 degrees of ankle dorsiflexion in a weight bearing stance. After completion of molding, a stable base was applied to the orthotic blank. The blank was ground to a stable position for weight bearing. Size: 7 red EVA Base: blue EVA Posting: first ray Additional orthotic padding: bunion/ small bilat heel pads  Post orthotic gait we controlled to 0 pronation/ 10 deg supination at heel strike/  Comfortable and felt neutral   I spent 35 mins in evaluation and preparation/ over 50% time spent in counseling for foot protection and orthotic use.  Ila Mcgill, MD

## 2016-01-31 ENCOUNTER — Ambulatory Visit (INDEPENDENT_AMBULATORY_CARE_PROVIDER_SITE_OTHER): Payer: BC Managed Care – PPO | Admitting: Gynecology

## 2016-01-31 ENCOUNTER — Encounter: Payer: Self-pay | Admitting: Gynecology

## 2016-01-31 VITALS — BP 114/74 | Ht 64.0 in | Wt 144.4 lb

## 2016-01-31 DIAGNOSIS — M8580 Other specified disorders of bone density and structure, unspecified site: Secondary | ICD-10-CM | POA: Diagnosis not present

## 2016-01-31 DIAGNOSIS — Z01419 Encounter for gynecological examination (general) (routine) without abnormal findings: Secondary | ICD-10-CM | POA: Diagnosis not present

## 2016-01-31 DIAGNOSIS — Z79890 Hormone replacement therapy (postmenopausal): Secondary | ICD-10-CM

## 2016-01-31 DIAGNOSIS — N3941 Urge incontinence: Secondary | ICD-10-CM | POA: Diagnosis not present

## 2016-01-31 DIAGNOSIS — N952 Postmenopausal atrophic vaginitis: Secondary | ICD-10-CM

## 2016-01-31 MED ORDER — CONJ ESTROG-MEDROXYPROGEST ACE 0.45-1.5 MG PO TABS: 1.0000 | ORAL_TABLET | Freq: Every day | ORAL | Status: DC

## 2016-01-31 MED ORDER — SOLIFENACIN SUCCINATE 5 MG PO TABS: 5.0000 mg | ORAL_TABLET | Freq: Every day | ORAL | Status: DC

## 2016-01-31 NOTE — Progress Notes (Signed)
    Sabrina Tapia [birth year] [id number]        65 y.o.  G0P0  for annual exam.  Several issues noted below.  Past medical history,surgical history, problem list, medications, allergies, family history and social history were all reviewed and documented as reviewed in the EPIC chart.  ROS:  Performed with pertinent positives and negatives included in the history, assessment and plan.   Additional significant findings :  None   Exam: Sabrina Tapia Vitals:   01/31/16 1356  BP: 114/74  Height: 5\' 4"  (1.626 m)  Weight: 144 lb 6.4 oz (65.499 kg)   General appearance:  Normal affect, orientation and appearance. Skin: Grossly normal HEENT: Without gross lesions.  No cervical or supraclavicular adenopathy. Thyroid normal.  Lungs:  Clear without wheezing, rales or rhonchi Cardiac: RR, without RMG Abdominal:  Soft, nontender, without masses, guarding, rebound, organomegaly or hernia Breasts:  Examined lying and sitting without masses, retractions, discharge or axillary adenopathy. Pelvic:  Ext/BUS/Vagina with atrophic changes  Cervix with atrophic changes  Uterus anteverted, normal size, shape and contour, midline and mobile nontender   Adnexa without masses or tenderness    Anus and perineum normal   Rectovaginal normal sphincter tone without palpated masses or tenderness.    Assessment/Plan:  65 y.o. G0P0 female for annual exam.   1. Postmenopausal/atrophic genital changes.  Continues on Prempro 0.45/1.5. Tried stopping with unacceptable hot flushes and night sweats. I reviewed the 2017 NAMS guidelines for HRT. As a physician/patient shared decision process we both agree to continue HRT where she feels the benefits outweigh the risks. I reviewed potential risks to include thrombosis such as stroke heart attack DVT and breast cancer. Benefits as far as bone health and possible cardiovascular benefits reviewed as she had started this early. Refill 1 year provided. She's  done no bleeding and she knows the need to report any vaginal bleeding. 2. Urge incontinence. Using Vesicare 5 mg daily. Tried Myrbetriq but did not notice any real improvement in prefers the [employer]. Refill 1 year provided. Check urinalysis today. 3. Mammography due now. Order for diagnostic mammogram called due to her dense breasts and history of cysts. Patient will follow up for this. SBE monthly reviewed. 4. Osteopenia. DEXA 2015 T score -2.1. FRAX 7.7%/0.7%. Schedule follow up DEXA now. Increased calcium vitamin D. 5. Pap smear 11/2014. No Pap smear done today. History of LGSIL 2001 with normal Paps afterwards. Plan repeat Pap smear at 3 year interval per current screening guidelines. 6. Colonoscopy 2011. Repeat at their recommended interval. 7. Health maintenance. No routine blood work done as patient does this elsewhere. Follow up 1 year, sooner as needed.   Anastasio Auerbach MD, 2:22 PM 01/31/2016

## 2016-01-31 NOTE — Patient Instructions (Signed)

## 2016-02-01 LAB — URINALYSIS W MICROSCOPIC + REFLEX CULTURE
BACTERIA UA: NONE SEEN [HPF]
BILIRUBIN URINE: NEGATIVE
Casts: NONE SEEN [LPF]
Crystals: NONE SEEN [HPF]
GLUCOSE, UA: NEGATIVE
HGB URINE DIPSTICK: NEGATIVE
Ketones, ur: NEGATIVE
NITRITE: NEGATIVE
PH: 6.5 (ref 5.0–8.0)
PROTEIN: NEGATIVE
RBC / HPF: NONE SEEN RBC/HPF (ref <=2)
SQUAMOUS EPITHELIAL / LPF: NONE SEEN [HPF] (ref <=5)
Specific Gravity, Urine: 1.006 (ref 1.001–1.035)
WBC UA: NONE SEEN WBC/HPF (ref <=5)
Yeast: NONE SEEN [HPF]

## 2016-02-02 LAB — URINE CULTURE

## 2016-02-04 ENCOUNTER — Telehealth: Payer: Self-pay | Admitting: *Deleted

## 2016-02-04 NOTE — Telephone Encounter (Signed)
Order faxed to [facility] pt will call to schedule.

## 2016-02-04 NOTE — Telephone Encounter (Signed)
-----   Message from Anastasio Auerbach, MD sent at 01/31/2016  2:20 PM EDT ----- Call in order to [facility] for diagnostic bilateral mammography reference history of dense breasts with cysts

## 2016-02-19 ENCOUNTER — Other Ambulatory Visit: Payer: Self-pay | Admitting: Physician Assistant

## 2016-02-19 DIAGNOSIS — R42 Dizziness and giddiness: Secondary | ICD-10-CM

## 2016-02-19 DIAGNOSIS — H903 Sensorineural hearing loss, bilateral: Secondary | ICD-10-CM | POA: Insufficient documentation

## 2016-02-19 DIAGNOSIS — H9313 Tinnitus, bilateral: Secondary | ICD-10-CM | POA: Insufficient documentation

## 2016-02-29 ENCOUNTER — Other Ambulatory Visit: Payer: BC Managed Care – PPO

## 2016-03-04 ENCOUNTER — Ambulatory Visit (INDEPENDENT_AMBULATORY_CARE_PROVIDER_SITE_OTHER): Payer: BC Managed Care – PPO

## 2016-03-04 ENCOUNTER — Other Ambulatory Visit: Payer: Self-pay | Admitting: Gynecology

## 2016-03-04 DIAGNOSIS — Z13820 Encounter for screening for osteoporosis: Secondary | ICD-10-CM | POA: Diagnosis not present

## 2016-03-04 DIAGNOSIS — M8580 Other specified disorders of bone density and structure, unspecified site: Secondary | ICD-10-CM

## 2016-03-04 DIAGNOSIS — M899 Disorder of bone, unspecified: Secondary | ICD-10-CM

## 2016-03-05 ENCOUNTER — Encounter: Payer: Self-pay | Admitting: Gynecology

## 2016-03-06 ENCOUNTER — Encounter: Payer: Self-pay | Admitting: Gynecology

## 2016-03-06 ENCOUNTER — Other Ambulatory Visit: Payer: Self-pay | Admitting: Radiology

## 2016-03-08 ENCOUNTER — Other Ambulatory Visit: Payer: Self-pay | Admitting: Oncology

## 2016-03-12 ENCOUNTER — Encounter: Payer: Self-pay | Admitting: Genetic Counselor

## 2016-03-14 ENCOUNTER — Other Ambulatory Visit: Payer: Self-pay | Admitting: General Surgery

## 2016-03-14 DIAGNOSIS — C50411 Malignant neoplasm of upper-outer quadrant of right female breast: Secondary | ICD-10-CM

## 2016-03-18 ENCOUNTER — Ambulatory Visit (INDEPENDENT_AMBULATORY_CARE_PROVIDER_SITE_OTHER): Payer: BC Managed Care – PPO | Admitting: Women's Health

## 2016-03-18 ENCOUNTER — Encounter: Payer: Self-pay | Admitting: Women's Health

## 2016-03-18 VITALS — BP 128/80 | Ht 64.0 in | Wt 142.0 lb

## 2016-03-18 DIAGNOSIS — G4700 Insomnia, unspecified: Secondary | ICD-10-CM | POA: Diagnosis not present

## 2016-03-18 MED ORDER — VENLAFAXINE HCL ER 37.5 MG PO CP24: 37.5000 mg | ORAL_CAPSULE | Freq: Every day | ORAL | 6 refills | Status: DC

## 2016-03-18 MED ORDER — ALPRAZOLAM 0.25 MG PO TABS: 0.2500 mg | ORAL_TABLET | Freq: Every evening | ORAL | 1 refills | Status: DC | PRN

## 2016-03-18 NOTE — Progress Notes (Signed)
Presents with complaint of needing to stop Prempro which has helped with menopausal symptoms. Diagnosed with right breast ductal carcinoma scheduled for lumpectomy 03/26/2016. Has been on Prempro 0.45/1.5 daily is now down to every other day, has 2 tablets left. Reports having terrible hot flashes when off in the past. Has an active lifestyle of golf. Used to have a good attitude and optimism concerning impending surgery.  Exam: Appears well.  Recent diagnosis of right ductal  breast cancer positive ER, PR, HER-2 negative  Plan: Reviewed need to stop HRT will cut remaining 2 tablets in half take every other day and stop. Options reviewed will try Effexor reports wanting to try the lowest dose will start with 37.5 by mouth daily reviewed low dose may need to increase. Also try vitamin E twice daily. Instructed to call if continued problems.

## 2016-03-18 NOTE — Patient Instructions (Signed)
Breast Cancer, Female °Breast cancer is an abnormal growth of tissue (tumor) in the breast that is cancerous (malignant). Unlike noncancerous (benign) tumors, malignant tumors can spread to other parts of your body. The most common type of female breast cancer begins in the milk ducts (ductal carcinoma). Breast cancer is one of the most common types of cancer in women. °CAUSES  °The exact cause of female breast cancer is unknown.  °RISK FACTORS °· Age older than 55 years. °· Family history of breast cancer. °· Having the BRCA1 and BRCA2 genes. °· Personal history of radiation exposure. °· Obesity. °· Menstrual periods that begin before age 12 years. °· Menopause that begins after age 55 years. °· Pregnant for the first time at the age of 35 years or older. °· Using hormone therapy. °· Drinking more than one alcoholic drink per day. °SIGNS AND SYMPTOMS  °· A painless lump in your breast. °· Changes in the size or shape of your breast. °· Breast skin changes, such as puckering or dimpling. °· Nipple abnormalities, such as scaling, crustiness, redness, or pulling in (retraction). °· Nipple discharge that is bloody or clear. °DIAGNOSIS  °Your health care provider will ask about your medical history. He or she may also perform a number of procedures, such as: °· A physical exam. This will involve feeling the tissue around the breast and under the arms. °· Taking a sample of nipple discharge. The sample will be examined under a microscope. °· Breast X-rays (mammogram), breast ultrasound exams, or an MRI. °· Taking a tissue sample (biopsy) from the breast. The sample will be examined under a microscope to look for cancer cells. °Your cancer will be staged to determine its severity and extent. Staging is a careful attempt to find out the size of the tumor, whether the cancer has spread, and if so, to what parts of the body. You may need to have more tests to determine the stage of your cancer: °· Stage 0--The tumor has not  spread to other breast tissue. °· Stage I--The cancer is only found in the breast. The tumor may be up to ¾ in (2 cm) wide. °· Stage II--The cancer has spread to nearby lymph nodes. The tumor may be up to 2 in (5 cm) wide. °· Stage III--The cancer has spread to more distant lymph nodes. The tumor may be larger than 2 in (5 cm) wide. °· Stage IV--The cancer has spread to other parts of the body, such as the bones, brain, liver, or lungs. °TREATMENT  °Depending on the type and stage, female breast cancer may be treated with one or more of the following therapies: °· Surgery to remove just the tumor (lumpectomy) or the entire breast (mastectomy). Lymph nodes may also be removed. °· Radiation therapy, which uses high-energy rays to kill cancer cells. °· Chemotherapy, which is the use of drugs to kill cancer cells. °· Hormone therapy, which involves taking medicine to adjust the hormone levels in your body. You may take medicine to decrease your estrogen levels. This can help stop cancer cells from growing. °HOME CARE INSTRUCTIONS  °· Take medicines only as directed by your health care provider. °· Maintain a healthy diet. °· Consider joining a support group. This may help you learn to cope with the stress of having breast cancer. °· Keep all follow-up appointments as directed by your health care provider. °SEEK MEDICAL CARE IF: °· You have a sudden increase in pain. °· You notice a new lump in either   breast or under your arm. °· You develop swelling in either arm or hand. °· You lose weight without trying. °· You have a fever. °· You notice new fatigue or weakness. °SEEK IMMEDIATE MEDICAL CARE IF:  °· You have chest pain or trouble breathing. °· You faint. °  °This information is not intended to replace advice given to you by your health care provider. Make sure you discuss any questions you have with your health care provider. °  °Document Released: 10/22/2005 Document Revised: 04/04/2015 Document Reviewed:  09/07/2013 °Elsevier Interactive Patient Education ©2016 [employer] ° °

## 2016-03-20 ENCOUNTER — Encounter: Payer: Self-pay | Admitting: Gynecology

## 2016-03-20 ENCOUNTER — Telehealth: Payer: Self-pay | Admitting: Oncology

## 2016-03-20 ENCOUNTER — Encounter (HOSPITAL_BASED_OUTPATIENT_CLINIC_OR_DEPARTMENT_OTHER): Payer: Self-pay | Admitting: *Deleted

## 2016-03-20 ENCOUNTER — Encounter: Payer: Self-pay | Admitting: Oncology

## 2016-03-20 NOTE — Telephone Encounter (Signed)
Telephone call to schedule an appt. Pt made me aware that she prefers Dr. Jana Hakim and that she currently has the shingles. An appt was scheduled with GM for 9/5 at 430pm. Made pt aware to notify me if there are any changes to her condition in case we have to reschedule. She voiced understanding. Demographics verified. Letter to the referring and mailed to the pt.

## 2016-03-20 NOTE — Progress Notes (Signed)
Pre op phone call done. Pt reports that she has just been diagnosed with shingles.  Per pt Dr. Donne Hazel is aware and surgery may need to be postponed.

## 2016-03-21 ENCOUNTER — Telehealth: Payer: Self-pay | Admitting: *Deleted

## 2016-03-21 NOTE — Telephone Encounter (Signed)
Received appt date/time from Andrea.  Mailed new pt packet to pt.  

## 2016-03-23 ENCOUNTER — Other Ambulatory Visit: Payer: Self-pay | Admitting: Oncology

## 2016-03-24 NOTE — Progress Notes (Signed)
Pt given 8oz carton of boost breeze with verbal and written instructions to drink at 0930 morning of surgery, and no other liquids after midnight. Pt voiced understanding

## 2016-03-26 ENCOUNTER — Encounter (HOSPITAL_BASED_OUTPATIENT_CLINIC_OR_DEPARTMENT_OTHER)
Admission: RE | Disposition: A | Discharge status: Home / Self Care | Payer: Self-pay | Source: Ambulatory Visit | Attending: General Surgery

## 2016-03-26 ENCOUNTER — Encounter (HOSPITAL_BASED_OUTPATIENT_CLINIC_OR_DEPARTMENT_OTHER): Payer: Self-pay | Admitting: Anesthesiology

## 2016-03-26 ENCOUNTER — Ambulatory Visit (HOSPITAL_BASED_OUTPATIENT_CLINIC_OR_DEPARTMENT_OTHER)
Admission: RE | Admit: 2016-03-26 | Discharge: 2016-03-26 | Disposition: A | Discharge status: Home / Self Care | Payer: BC Managed Care – PPO | Source: Ambulatory Visit | Attending: General Surgery | Admitting: General Surgery

## 2016-03-26 ENCOUNTER — Ambulatory Visit (HOSPITAL_BASED_OUTPATIENT_CLINIC_OR_DEPARTMENT_OTHER): Payer: BC Managed Care – PPO | Admitting: Anesthesiology

## 2016-03-26 ENCOUNTER — Ambulatory Visit (HOSPITAL_COMMUNITY)
Admission: RE | Admit: 2016-03-26 | Discharge: 2016-03-26 | Disposition: A | Discharge status: Home / Self Care | Payer: BC Managed Care – PPO | Source: Ambulatory Visit | Attending: General Surgery | Admitting: General Surgery

## 2016-03-26 DIAGNOSIS — C50411 Malignant neoplasm of upper-outer quadrant of right female breast: Secondary | ICD-10-CM

## 2016-03-26 DIAGNOSIS — Z87891 Personal history of nicotine dependence: Secondary | ICD-10-CM | POA: Diagnosis not present

## 2016-03-26 DIAGNOSIS — M1990 Unspecified osteoarthritis, unspecified site: Secondary | ICD-10-CM | POA: Diagnosis not present

## 2016-03-26 DIAGNOSIS — K219 Gastro-esophageal reflux disease without esophagitis: Secondary | ICD-10-CM | POA: Diagnosis not present

## 2016-03-26 HISTORY — PX: RADIOACTIVE SEED GUIDED PARTIAL MASTECTOMY WITH AXILLARY SENTINEL LYMPH NODE BIOPSY: SHX6520

## 2016-03-26 SURGERY — RADIOACTIVE SEED GUIDED PARTIAL MASTECTOMY WITH AXILLARY SENTINEL LYMPH NODE BIOPSY
Anesthesia: Regional | Site: Breast | Laterality: Right

## 2016-03-26 MED ORDER — FENTANYL CITRATE (PF) 100 MCG/2ML IJ SOLN
INTRAMUSCULAR | Status: AC
  Filled 2016-03-26: qty 2

## 2016-03-26 MED ORDER — CEFAZOLIN SODIUM-DEXTROSE 2-4 GM/100ML-% IV SOLN
INTRAVENOUS | Status: AC
  Filled 2016-03-26: qty 100

## 2016-03-26 MED ORDER — MIDAZOLAM HCL 2 MG/2ML IJ SOLN
1.0000 mg | INTRAMUSCULAR | Status: DC | PRN
  Administered 2016-03-26: 0.5 mg via INTRAVENOUS
  Administered 2016-03-26: 1 mg via INTRAVENOUS
  Administered 2016-03-26: 0.5 mg via INTRAVENOUS

## 2016-03-26 MED ORDER — PROMETHAZINE HCL 25 MG/ML IJ SOLN: 6.2500 mg | INTRAMUSCULAR | Status: DC | PRN

## 2016-03-26 MED ORDER — SCOPOLAMINE 1 MG/3DAYS TD PT72: 1.0000 | MEDICATED_PATCH | Freq: Once | TRANSDERMAL | Status: DC | PRN

## 2016-03-26 MED ORDER — HYDROMORPHONE HCL 1 MG/ML IJ SOLN: 0.2500 mg | INTRAMUSCULAR | Status: DC | PRN

## 2016-03-26 MED ORDER — GABAPENTIN 300 MG PO CAPS
300.0000 mg | ORAL_CAPSULE | ORAL | Status: AC
  Administered 2016-03-26: 300 mg via ORAL

## 2016-03-26 MED ORDER — DEXAMETHASONE SODIUM PHOSPHATE 10 MG/ML IJ SOLN
INTRAMUSCULAR | Status: AC
  Filled 2016-03-26: qty 1

## 2016-03-26 MED ORDER — MIDAZOLAM HCL 2 MG/2ML IJ SOLN
INTRAMUSCULAR | Status: AC
  Filled 2016-03-26: qty 2

## 2016-03-26 MED ORDER — LIDOCAINE HCL (CARDIAC) 20 MG/ML IV SOLN
INTRAVENOUS | Status: DC | PRN
  Administered 2016-03-26: 30 mg via INTRAVENOUS

## 2016-03-26 MED ORDER — HYDROCODONE-ACETAMINOPHEN 5-325 MG PO TABS
ORAL_TABLET | ORAL | Status: AC
  Filled 2016-03-26: qty 1

## 2016-03-26 MED ORDER — PROPOFOL 10 MG/ML IV BOLUS
INTRAVENOUS | Status: DC | PRN
  Administered 2016-03-26: 150 mg via INTRAVENOUS

## 2016-03-26 MED ORDER — EPHEDRINE SULFATE 50 MG/ML IJ SOLN
INTRAMUSCULAR | Status: DC | PRN
  Administered 2016-03-26 (×2): 10 mg via INTRAVENOUS

## 2016-03-26 MED ORDER — CEFAZOLIN SODIUM-DEXTROSE 2-4 GM/100ML-% IV SOLN
2.0000 g | INTRAVENOUS | Status: AC
  Administered 2016-03-26: 2 g via INTRAVENOUS

## 2016-03-26 MED ORDER — ONDANSETRON HCL 4 MG/2ML IJ SOLN
INTRAMUSCULAR | Status: DC | PRN
  Administered 2016-03-26: 4 mg via INTRAVENOUS

## 2016-03-26 MED ORDER — HYDROCODONE-ACETAMINOPHEN 7.5-325 MG PO TABS: 1.0000 | ORAL_TABLET | Freq: Once | ORAL | Status: DC | PRN

## 2016-03-26 MED ORDER — LACTATED RINGERS IV SOLN
INTRAVENOUS | Status: DC
  Administered 2016-03-26 (×2): via INTRAVENOUS

## 2016-03-26 MED ORDER — PROPOFOL 10 MG/ML IV BOLUS
INTRAVENOUS | Status: AC
  Filled 2016-03-26: qty 20

## 2016-03-26 MED ORDER — ONDANSETRON HCL 4 MG/2ML IJ SOLN
INTRAMUSCULAR | Status: AC
  Filled 2016-03-26: qty 2

## 2016-03-26 MED ORDER — DEXAMETHASONE SODIUM PHOSPHATE 4 MG/ML IJ SOLN
INTRAMUSCULAR | Status: DC | PRN
  Administered 2016-03-26: 10 mg via INTRAVENOUS

## 2016-03-26 MED ORDER — GLYCOPYRROLATE 0.2 MG/ML IJ SOLN: 0.2000 mg | Freq: Once | INTRAMUSCULAR | Status: DC | PRN

## 2016-03-26 MED ORDER — ACETAMINOPHEN 500 MG PO TABS
1000.0000 mg | ORAL_TABLET | ORAL | Status: AC
  Administered 2016-03-26: 1000 mg via ORAL

## 2016-03-26 MED ORDER — BUPIVACAINE-EPINEPHRINE (PF) 0.5% -1:200000 IJ SOLN
INTRAMUSCULAR | Status: DC | PRN
  Administered 2016-03-26: 25 mL

## 2016-03-26 MED ORDER — LIDOCAINE 2% (20 MG/ML) 5 ML SYRINGE
INTRAMUSCULAR | Status: AC
  Filled 2016-03-26: qty 5

## 2016-03-26 MED ORDER — TECHNETIUM TC 99M SULFUR COLLOID FILTERED
1.0000 | Freq: Once | INTRAVENOUS | Status: AC | PRN
  Administered 2016-03-26: 1 via INTRADERMAL

## 2016-03-26 MED ORDER — BUPIVACAINE HCL (PF) 0.25 % IJ SOLN
INTRAMUSCULAR | Status: DC | PRN
  Administered 2016-03-26: 6 mL

## 2016-03-26 MED ORDER — FENTANYL CITRATE (PF) 100 MCG/2ML IJ SOLN
50.0000 ug | INTRAMUSCULAR | Status: AC | PRN
  Administered 2016-03-26: 50 ug via INTRAVENOUS
  Administered 2016-03-26 (×2): 25 ug via INTRAVENOUS

## 2016-03-26 MED ORDER — GABAPENTIN 300 MG PO CAPS
ORAL_CAPSULE | ORAL | Status: AC
  Filled 2016-03-26: qty 1

## 2016-03-26 MED ORDER — ACETAMINOPHEN 500 MG PO TABS
ORAL_TABLET | ORAL | Status: AC
  Filled 2016-03-26: qty 2

## 2016-03-26 MED ORDER — HYDROCODONE-ACETAMINOPHEN 10-325 MG PO TABS: 1.0000 | ORAL_TABLET | Freq: Four times a day (QID) | ORAL | 0 refills | Status: DC | PRN

## 2016-03-26 MED ORDER — HYDROCODONE-ACETAMINOPHEN 5-325 MG PO TABS
1.0000 | ORAL_TABLET | Freq: Four times a day (QID) | ORAL | Status: DC | PRN
  Administered 2016-03-26: 1 via ORAL

## 2016-03-26 SURGICAL SUPPLY — 44 items
BINDER BREAST LRG (GAUZE/BANDAGES/DRESSINGS) ×1 IMPLANT
BLADE SURG 15 STRL LF DISP TIS (BLADE) ×1 IMPLANT
BLADE SURG 15 STRL SS (BLADE) ×2
CHLORAPREP W/TINT 26ML (MISCELLANEOUS) ×2 IMPLANT
CLIP TI WIDE RED SMALL 6 (CLIP) ×2 IMPLANT
COVER BACK TABLE 60X90IN (DRAPES) ×2 IMPLANT
COVER MAYO STAND STRL (DRAPES) ×2 IMPLANT
COVER PROBE W GEL 5X96 (DRAPES) ×2 IMPLANT
DEVICE DUBIN W/COMP PLATE 8390 (MISCELLANEOUS) ×2 IMPLANT
DRAPE LAPAROSCOPIC ABDOMINAL (DRAPES) ×2 IMPLANT
DRAPE UTILITY XL STRL (DRAPES) ×2 IMPLANT
ELECT COATED BLADE 2.86 ST (ELECTRODE) ×2 IMPLANT
ELECT REM PT RETURN 9FT ADLT (ELECTROSURGICAL) ×2
ELECTRODE REM PT RTRN 9FT ADLT (ELECTROSURGICAL) ×1 IMPLANT
GLOVE BIO SURGEON STRL SZ7 (GLOVE) ×4 IMPLANT
GLOVE BIOGEL PI IND STRL 6.5 (GLOVE) IMPLANT
GLOVE BIOGEL PI IND STRL 7.0 (GLOVE) IMPLANT
GLOVE BIOGEL PI IND STRL 7.5 (GLOVE) ×1 IMPLANT
GLOVE BIOGEL PI INDICATOR 6.5 (GLOVE) ×1
GLOVE BIOGEL PI INDICATOR 7.0 (GLOVE) ×1
GLOVE BIOGEL PI INDICATOR 7.5 (GLOVE) ×1
GLOVE ECLIPSE 7.0 STRL STRAW (GLOVE) ×1 IMPLANT
GOWN STRL REUS W/ TWL LRG LVL3 (GOWN DISPOSABLE) ×2 IMPLANT
GOWN STRL REUS W/TWL LRG LVL3 (GOWN DISPOSABLE) ×4
HEMOSTAT ARISTA ABSORB 3G PWDR (MISCELLANEOUS) ×1 IMPLANT
ILLUMINATOR WAVEGUIDE N/F (MISCELLANEOUS) ×1 IMPLANT
KIT MARKER MARGIN INK (KITS) ×2 IMPLANT
LIQUID BAND (GAUZE/BANDAGES/DRESSINGS) ×2 IMPLANT
NDL HYPO 25X1 1.5 SAFETY (NEEDLE) ×1 IMPLANT
NEEDLE HYPO 25X1 1.5 SAFETY (NEEDLE) ×2 IMPLANT
PACK BASIN DAY SURGERY FS (CUSTOM PROCEDURE TRAY) ×2 IMPLANT
PENCIL BUTTON HOLSTER BLD 10FT (ELECTRODE) ×2 IMPLANT
SLEEVE SCD COMPRESS KNEE MED (MISCELLANEOUS) ×2 IMPLANT
SPONGE LAP 4X18 X RAY DECT (DISPOSABLE) ×2 IMPLANT
STRIP CLOSURE SKIN 1/2X4 (GAUZE/BANDAGES/DRESSINGS) ×2 IMPLANT
SUT MNCRL AB 4-0 PS2 18 (SUTURE) ×2 IMPLANT
SUT SILK 2 0 SH (SUTURE) ×1 IMPLANT
SUT VIC AB 2-0 SH 27 (SUTURE) ×4
SUT VIC AB 2-0 SH 27XBRD (SUTURE) ×1 IMPLANT
SUT VIC AB 3-0 SH 27 (SUTURE) ×4
SUT VIC AB 3-0 SH 27X BRD (SUTURE) ×1 IMPLANT
SYR CONTROL 10ML LL (SYRINGE) ×2 IMPLANT
TOWEL OR 17X24 6PK STRL BLUE (TOWEL DISPOSABLE) ×2 IMPLANT
TOWEL OR NON WOVEN STRL DISP B (DISPOSABLE) ×2 IMPLANT

## 2016-03-26 NOTE — H&P (Signed)
75 yof referred by Dr Christene Slates for evaluation for new right breast cancer. she has no personal history of cancer but has cystic disease managed before. she has fh only in a maternal gm. she had no mass or dc present. she underwent screening mm that shows c density breasts. there is new architectural distortion in right uoq. on [place] this is a 5 mm mass. US guided biopsy with clip placement was done. this is a grade I IDC with calcs and dcis, er pos at 95, pr pos at 95, Ki is 12%, her2 is negative. She is here with her sister and brother to discuss options she is on prempro and will need to come off.   Other Problems Erasmo Leventhal, RN, BSN; 03/14/2016 10:37 AM) Arthritis Bladder Problems Breast Cancer Gastroesophageal Reflux Disease Hemorrhoids Lump In Breast Migraine Headache  Past Surgical History Erasmo Leventhal, RN, BSN; 03/14/2016 10:37 AM) Breast Biopsy Right. Colon Polyp Removal - Colonoscopy Foot Surgery Left.  Diagnostic Studies History Erasmo Leventhal, RN, BSN; 03/14/2016 10:37 AM) Colonoscopy within last year Mammogram within last year Pap Smear 1-5 years ago  Allergies Erasmo Leventhal, RN, BSN; 03/14/2016 10:39 AM) No Known Drug Allergies08/18/2017  Medication History Erasmo Leventhal, RN, BSN; 03/14/2016 10:54 AM) Fluticasone Propionate (50MCG/ACT Suspension, Nasal two times daily) Active. Qvar (40MCG/ACT Aerosol Soln, Inhalation two times daily) Active. ProAir HFA (108 (90 Base)MCG/ACT Aerosol Soln, Inhalation as needed) Active. VESIcare (5MG Tablet, Oral daily) Active. ZyrTEC Allergy (10MG Tablet, Oral as needed) Active. PriLOSEC OTC (20MG Tablet DR, Oral daily) Active. Prempro (0.45-1.5MG Tablet, Oral daily) Active. Colace (100MG Capsule, Oral daily) Active. MiraLax (Oral) Active. Medications Reconciled Advil (200MG Capsule, Oral) Active. Anucort-HC (25MG Suppository, Rectal) Active. ValACYclovir HCl (1GM Tablet, Oral)  Active. Multiple Vitamin (Oral) Active. Omega 3 (1000MG Capsule, Oral daily) Active. Calcium Citrate-Vitamin D (500-400MG-UNIT Tablet Chewable, Oral) Active. Vitamin D (400UNIT Tablet, Oral) Active. Lutein 20 (Oral) Active. Probiotic-10 (Oral) Active. Turmeric (450MG Capsule, Oral) Active.  Social History (Erasmo Leventhal, RN, BSN; 03/14/2016 10:37 AM) Alcohol use Moderate alcohol use. Caffeine use Coffee. No drug use Tobacco use Former smoker.  Family History (Erasmo Leventhal, RN, BSN; 03/14/2016 10:37 AM) Alcohol Abuse Father. Arthritis Brother, Mother. Cancer Father. Colon Cancer Father. Colon Polyps Brother, Father. Heart Disease Father, Mother. Hypertension Brother, Father, Mother. Migraine Headache Father, Mother. Respiratory Condition Father, Mother. Thyroid problems Mother.  Pregnancy / Birth History Erasmo Leventhal, RN, BSN; 03/14/2016 10:37 AM) Age at menarche 28 years. Age of menopause <45 Gravida 0 Para 0    Review of Systems Occupational hygienist, BSN; 03/14/2016 10:37 AM) General Not Present- Appetite Loss, Chills, Fatigue, Fever, Night Sweats, Weight Gain and Weight Loss. Skin Present- Change in Wart/Mole. Not Present- Dryness, Hives, Jaundice, New Lesions, Non-Healing Wounds, Rash and Ulcer. HEENT Present- Hearing Loss, Ringing in the Ears, Seasonal Allergies and Wears glasses/contact lenses. Not Present- Earache, Hoarseness, Nose Bleed, Oral Ulcers, Sinus Pain, Sore Throat, Visual Disturbances and Yellow Eyes. Breast Present- Breast Mass. Not Present- Breast Pain, Nipple Discharge and Skin Changes. Cardiovascular Not Present- Chest Pain, Difficulty Breathing Lying Down, Leg Cramps, Palpitations, Rapid Heart Rate, Shortness of Breath and Swelling of Extremities. Gastrointestinal Present- Constipation, Hemorrhoids and Indigestion. Not Present- Abdominal Pain, Bloating, Bloody Stool, Change in Bowel Habits, Chronic diarrhea, Difficulty  Swallowing, Excessive gas, Gets full quickly at meals, Nausea, Rectal Pain and Vomiting. Female Genitourinary Not Present- Frequency, Nocturia, Painful Urination, Pelvic Pain and Urgency. Musculoskeletal Not Present- Back Pain, Joint Pain, Joint Stiffness, Muscle Pain, Muscle Weakness  and Swelling of Extremities. Neurological Not Present- Decreased Memory, Fainting, Headaches, Numbness, Seizures, Tingling, Tremor, Trouble walking and Weakness. Psychiatric Present- Change in Sleep Pattern. Not Present- Anxiety, Bipolar, Depression, Fearful and Frequent crying. Endocrine Not Present- Cold Intolerance, Excessive Hunger, Hair Changes, Heat Intolerance, Hot flashes and New Diabetes. Hematology Not Present- Blood Thinners, Easy Bruising, Excessive bleeding, Gland problems, HIV and Persistent Infections.  Vitals Occupational hygienist, BSN; 03/14/2016 10:38 AM) 03/14/2016 10:38 AM Weight: 145.2 lb Height: 64in Body Surface Area: 1.71 m Body Mass Index: 24.92 kg/m  Temp.: 98.23F(Oral)  Pulse: 78 (Regular)  BP: 140/60 (Sitting, Left Arm, Standard)       Physical Exam Rolm Bookbinder MD; 03/14/2016 11:37 AM) General Mental Status-Alert. Orientation-Oriented X3.  Eye Sclera/Conjunctiva - Bilateral-No scleral icterus.  Chest and Lung Exam Chest and lung exam reveals -on auscultation, normal breath sounds, no adventitious sounds and normal vocal resonance.  Breast Nipples-No Discharge.  Cardiovascular Cardiovascular examination reveals -normal heart sounds, regular rate and rhythm with no murmurs.  Lymphatic Head & Neck  General Head & Neck Lymphatics: Bilateral - Description - Normal. Axillary  General Axillary Region: Bilateral - Description - Normal. Note: no [place] adenopathy     Assessment & Plan Rolm Bookbinder MD; 03/14/2016 11:36 AM) BREAST CANCER OF UPPER-OUTER QUADRANT OF RIGHT FEMALE BREAST (C50.411) Story: right breast seed guided lumpectomy,  right axillary sn biopsy We discussed the staging and pathophysiology of breast cancer. We discussed all of the different options for treatment for breast cancer including surgery, chemotherapy, radiation therapy, Herceptin, and antiestrogen therapy. We discussed a sentinel lymph node biopsy as she does not appear to having lymph node involvement right now. We discussed the performance of that with injection of radioactive tracer and possibly blue dye. We discussed that she would have an incision underneath her axillary hairline or would be done through the same incision. One of these options might be to return to the operating room to perform an axillary lymph node dissection. We discussed up to a 5% risk lifetime of chronic shoulder pain as well as lymphedema associated with a sentinel lymph node biopsy. We discussed the options for treatment of the breast cancer which included lumpectomy versus a mastectomy. We discussed the performance of the lumpectomy with radioactive seed placement. We discussed a 5-10% chance of a positive margin requiring reexcision in the operating room. We also discussed that she will likely need radiation therapy (this is usually 5-7 weeks) if she undergoes lumpectomy. The breast cannot undergo more radiation therapy in the same breast after lumpectomy in the future. We discussed the mastectomy (removal of whole breast) and the postoperative care for that as well. Mastectomy can be followed by reconstruction. This is a more extensive surgery and requires more recovery. The decision for lumpectomy vs mastectomy has no impact on decision for chemotherapy. Most mastectomy patients will not need radiation therapy. We discussed that there is no difference in her survival whether she undergoes lumpectomy with radiation therapy or antiestrogen therapy versus a mastectomy. There is also no real difference between her recurrence in the breast. We discussed the risks of operation

## 2016-03-26 NOTE — Anesthesia Procedure Notes (Signed)
Procedure Name: LMA Insertion Date/Time: 03/26/2016 1:42 PM Performed by: Lyan Moyano D Pre-anesthesia Checklist: Patient identified, Emergency Drugs available, Suction available and Patient being monitored Patient Re-evaluated:Patient Re-evaluated prior to inductionOxygen Delivery Method: Circle system utilized Preoxygenation: Pre-oxygenation with 100% oxygen Intubation Type: IV induction Ventilation: Mask ventilation without difficulty LMA: LMA inserted LMA Size: 4.0 Number of attempts: 1 Airway Equipment and Method: Bite block Placement Confirmation: positive ETCO2 Tube secured with: Tape Dental Injury: Teeth and Oropharynx as per pre-operative assessment

## 2016-03-26 NOTE — Interval H&P Note (Signed)
History and Physical Interval Note:  03/26/2016 1:23 PM  Sabrina Tapia  has presented today for surgery, with the diagnosis of RIGHT BREAST CANCER  The various methods of treatment have been discussed with the patient and family. After consideration of risks, benefits and other options for treatment, the patient has consented to  Procedure(s): RIGHT BREAST LUMPECTOMY WITH RADIOACTIVE SEED AND SENTINEL LYMPH NODE BIOPSY (Right) as a surgical intervention .  The patient's history has been reviewed, patient examined, no change in status, stable for surgery.  I have reviewed the patient's chart and labs.  Questions were answered to the patient's satisfaction.     Kayliee Atienza

## 2016-03-26 NOTE — Op Note (Signed)
Preoperative diagnosis: Right breast cancer, clinical stage 1 Postoperative diagnosis: same as above Procedure: 1. Right breast seed guided lumpectomy  2. Rightaxillary sentinel nodebiopsy Surgeon Dr Serita Grammes Anes general with pectoral block EBL: minimal Comps none Specimen:  1. Rightbreast marked with paint 2. Rightaxillary nodes with highest count of 202 3. Additional medial, inferior, superior margins marked short superior, long lateral, double deep Sponge count correct at completion dispo to recovery stable  Indications: This is a 67 yof with newly diagnosed right breast cancer.  We discussed options and elected to perform double seed lumpectomy with sn biopsy.    Procedure: After informed consent was obtained the patient was taken to the OR. She was injected with technetium in the standard periareolar fashion. She underwent a pectoral block. She was given anitibiotics. SCDs were in place. She was prepped and draped in the standard sterile surgical fashion. A timeout was performed. I thenlocated the seed in the right lateral breast. I infiltrated marcaine and made an axillary incision/ I used the lighted retractor system to tunnel to the lesion. I then removed the seed with an attempt to get a clear margin. This was passed off the table after marking with paint. I then removed an additional superior, medial and inferior margin and marked as above. The posterior margin is the pectoralis muscle and there is mostly skin anterior.  I did confirm removal of the clips and seed with mammography. I placed clips in the cavity.I then did the sn biopsy via the same incision.  I went through the axillary fascia. I identified the sentinel nodes with the highest count as above. There was no background radioactivity. I obtained hemostasis. I then closed the fascia with 2-0 vicryl. I placed arista in the entire cavity. I closed the breast tissue with 2-0 vicryl. I closed the skin with  3-0 vicryl and 4-0 monocryl. Glue and steristrips were placed A breast binder was placed. She was extubated and transferred to recovery stable

## 2016-03-26 NOTE — Anesthesia Procedure Notes (Addendum)
Anesthesia Regional Block:  Pectoralis block  Pre-Anesthetic Checklist: ,, timeout performed, Correct Patient, Correct Site, Correct Laterality, Correct Procedure, Correct Position, site marked, Risks and benefits discussed,  Surgical consent,  Pre-op evaluation,  At surgeon's request and post-op pain management  Laterality: Right  Prep: chloraprep       Needles:   Needle Type: Echogenic Needle     Needle Length: 9cm 9 cm Needle Gauge: 21 and 21 G    Additional Needles:  Procedures: ultrasound guided (picture in chart) Pectoralis block Narrative:  Start time: 03/26/2016 1:05 PM End time: 03/26/2016 1:12 PM Injection made incrementally with aspirations every 5 mL.  Performed by: Personally  Anesthesiologist: Suzette Battiest

## 2016-03-26 NOTE — Transfer of Care (Signed)
Immediate Anesthesia Transfer of Care Note  Patient: Sabrina Tapia  Procedure(s) Performed: Procedure(s): RIGHT BREAST LUMPECTOMY WITH RADIOACTIVE SEED AND SENTINEL LYMPH NODE BIOPSY (Right)  Patient Location: PACU  Anesthesia Type:GA combined with regional for post-op pain  Level of Consciousness: awake and patient cooperative  Airway & Oxygen Therapy: Patient Spontanous Breathing and Patient connected to face mask oxygen  Post-op Assessment: Report given to RN and Post -op Vital signs reviewed and stable  Post vital signs: Reviewed and stable  Last Vitals:  Vitals:   03/26/16 1317 03/26/16 1318  BP:    Pulse: 76 75  Resp: 18 14  Temp:      Last Pain:  Vitals:   03/26/16 1213  TempSrc: Oral         Complications: No apparent anesthesia complications

## 2016-03-26 NOTE — Anesthesia Preprocedure Evaluation (Signed)
Anesthesia Evaluation  Patient identified by MRN, date of birth, ID band Patient awake    Reviewed: Allergy & Precautions, NPO status , Patient's Chart, lab work & pertinent test results  Airway Mallampati: II  TM Distance: >3 FB     Dental   Pulmonary former smoker,    Pulmonary exam normal        Cardiovascular negative cardio ROS Normal cardiovascular exam     Neuro/Psych negative neurological ROS     GI/Hepatic Neg liver ROS, GERD  ,  Endo/Other  negative endocrine ROS  Renal/GU negative Renal ROS     Musculoskeletal  (+) Arthritis ,   Abdominal   Peds  Hematology negative hematology ROS (+)   Anesthesia Other Findings   Reproductive/Obstetrics                             Anesthesia Physical Anesthesia Plan  ASA: II  Anesthesia Plan: General and Regional   Post-op Pain Management:  Regional for Post-op pain   Induction: Intravenous  Airway Management Planned: LMA  Additional Equipment:   Intra-op Plan:   Post-operative Plan: Extubation in OR  Informed Consent: I have reviewed the patients History and Physical, chart, labs and discussed the procedure including the risks, benefits and alternatives for the proposed anesthesia with the patient or authorized representative who has indicated his/her understanding and acceptance.   Dental advisory given  Plan Discussed with: CRNA  Anesthesia Plan Comments:         Anesthesia Quick Evaluation

## 2016-03-26 NOTE — Anesthesia Postprocedure Evaluation (Signed)
Anesthesia Post Note  Patient: Sabrina Tapia  Procedure(s) Performed: Procedure(s) (LRB): RIGHT BREAST LUMPECTOMY WITH RADIOACTIVE SEED AND SENTINEL LYMPH NODE BIOPSY (Right)  Patient location during evaluation: PACU Anesthesia Type: General and Regional Level of consciousness: awake and alert Pain management: pain level controlled Vital Signs Assessment: post-procedure vital signs reviewed and stable Respiratory status: spontaneous breathing, nonlabored ventilation, respiratory function stable and patient connected to nasal cannula oxygen Cardiovascular status: blood pressure returned to baseline and stable Postop Assessment: no signs of nausea or vomiting Anesthetic complications: no    Last Vitals:  Vitals:   03/26/16 1515 03/26/16 1535  BP: 133/81 (!) 146/83  Pulse: 85 75  Resp: 14   Temp:  36.5 C    Last Pain:  Vitals:   03/26/16 1535  TempSrc:   PainSc: 0-No pain                 [name]

## 2016-03-26 NOTE — Discharge Instructions (Signed)
[facility] °Office Phone Number [phone] ° °POST OP INSTRUCTIONS ° °Always review your discharge instruction sheet given to you by the facility where your surgery was performed. ° °IF YOU HAVE DISABILITY OR FAMILY LEAVE FORMS, YOU MUST BRING THEM TO THE OFFICE FOR PROCESSING.  DO NOT GIVE THEM TO YOUR DOCTOR. ° °1. A prescription for pain medication may be given to you upon discharge.  Take your pain medication as prescribed, if needed.  If narcotic pain medicine is not needed, then you may take acetaminophen (Tylenol), naprosyn (Alleve) or ibuprofen (Advil) as needed. °2. Take your usually prescribed medications unless otherwise directed °3. If you need a refill on your pain medication, please contact your pharmacy.  They will contact our office to request authorization.  Prescriptions will not be filled after 5pm or on week-ends. °4. You should eat very light the first 24 hours after surgery, such as soup, crackers, pudding, etc.  Resume your normal diet the day after surgery. °5. Most patients will experience some swelling and bruising in the breast.  Ice packs and a good support bra will help.  Wear the breast binder provided or a sports bra for 72 hours day and night.  After that wear a sports bra during the day until you return to the office. Swelling and bruising can take several days to resolve.  °6. It is common to experience some constipation if taking pain medication after surgery.  Increasing fluid intake and taking a stool softener will usually help or prevent this problem from occurring.  A mild laxative (Milk of Magnesia or Miralax) should be taken according to package directions if there are no bowel movements after 48 hours. °7. Unless discharge instructions indicate otherwise, you may remove your bandages 48 hours after surgery and you may shower at that time.  You may have steri-strips (small skin tapes) in place directly over the incision.  These strips should be left on the  skin for 7-10 days and will come off on their own.  If your surgeon used skin glue on the incision, you may shower in 24 hours.  The glue will flake off over the next 2-3 weeks.  Any sutures or staples will be removed at the office during your follow-up visit. °8. ACTIVITIES:  You may resume regular daily activities (gradually increasing) beginning the next day.  Wearing a good support bra or sports bra minimizes pain and swelling.  You may have sexual intercourse when it is comfortable. °a. You may drive when you no longer are taking prescription pain medication, you can comfortably wear a seatbelt, and you can safely maneuver your car and apply brakes. °b. RETURN TO WORK:  ______________________________________________________________________________________ °9. You should see your doctor in the office for a follow-up appointment approximately two weeks after your surgery.  Your doctor’s nurse will typically make your follow-up appointment when she calls you with your pathology report.  Expect your pathology report 3-4 business days after your surgery.  You may call to check if you do not hear from us after three days. °10. OTHER INSTRUCTIONS: _______________________________________________________________________________________________ _____________________________________________________________________________________________________________________________________ °_____________________________________________________________________________________________________________________________________ °_____________________________________________________________________________________________________________________________________ ° °WHEN TO CALL DR WAKEFIELD: °1. Fever over 101.0 °2. Nausea and/or vomiting. °3. Extreme swelling or bruising. °4. Continued bleeding from incision. °5. Increased pain, redness, or drainage from the incision. ° °The clinic staff is available to answer your questions during regular  business hours.  Please don’t hesitate to call and ask to speak to one of the nurses for clinical concerns.  If   you have a medical emergency, go to the nearest emergency room or call 911.  A surgeon from [facility] is always on call at the hospital. ° °For further questions, please visit [email or web address] ° ° ° °Post Anesthesia Home Care Instructions ° °Activity: °Get plenty of rest for the remainder of the day. A responsible adult should stay with you for 24 hours following the procedure.  °For the next 24 hours, DO NOT: °-Drive a car °-Operate machinery °-Drink alcoholic beverages °-Take any medication unless instructed by your physician °-Make any legal decisions or sign important papers. ° °Meals: °Start with liquid foods such as gelatin or soup. Progress to regular foods as tolerated. Avoid greasy, spicy, heavy foods. If nausea and/or vomiting occur, drink only clear liquids until the nausea and/or vomiting subsides. Call your physician if vomiting continues. ° °Special Instructions/Symptoms: °Your throat may feel dry or sore from the anesthesia or the breathing tube placed in your throat during surgery. If this causes discomfort, gargle with warm salt water. The discomfort should disappear within 24 hours. ° °If you had a scopolamine patch placed behind your ear for the management of post- operative nausea and/or vomiting: ° °1. The medication in the patch is effective for 72 hours, after which it should be removed.  Wrap patch in a tissue and discard in the trash. Wash hands thoroughly with soap and water. °2. You may remove the patch earlier than 72 hours if you experience unpleasant side effects which may include dry mouth, dizziness or visual disturbances. °3. Avoid touching the patch. Wash your hands with soap and water after contact with the patch. °  ° °

## 2016-03-27 ENCOUNTER — Encounter (HOSPITAL_BASED_OUTPATIENT_CLINIC_OR_DEPARTMENT_OTHER): Payer: Self-pay | Admitting: General Surgery

## 2016-03-28 ENCOUNTER — Other Ambulatory Visit: Payer: Self-pay | Admitting: *Deleted

## 2016-03-28 DIAGNOSIS — C50919 Malignant neoplasm of unspecified site of unspecified female breast: Secondary | ICD-10-CM

## 2016-03-31 DIAGNOSIS — C50411 Malignant neoplasm of upper-outer quadrant of right female breast: Secondary | ICD-10-CM | POA: Insufficient documentation

## 2016-03-31 NOTE — Progress Notes (Signed)
[facility]  [phone] [phone]     ID: JAYLE SOLARZ DOB: Dec 12, 1950  MR#: [id number]  WSF#:[id number]  Patient Care Team: Josetta Huddle, MD as PCP - General (Internal Medicine) Rolm Bookbinder, MD as Consulting Physician (General Surgery) Chauncey Cruel, MD as Consulting Physician (Oncology) Anastasio Auerbach, MD as Consulting Physician (Gynecology) OTHER MD:  CHIEF COMPLAINT: Estrogen receptor positive breast cancer  CURRENT TREATMENT: Awaiting definitive surgery   BREAST CANCER HISTORY: "Sabrina Tapia" had screening mammography at her gynecologist's office suggestive of a change in the upper-outer quadrant of the right breast. She was referred to [facility] where on 03/04/2016 she underwent bilateral diagnostic mammography and right breast ultrasonography. The breast density was category C. In the upper outer quadrant of the right breast there was a new area of architectural distortion. By ultrasonography this measured 0.5 cm. The right axilla was reported as sonographically benign.  On 03/06/2016 she underwent core needle biopsy of the right breast mass in question, and this showed ([id number]) an invasive ductal carcinoma, grade 1, estrogen receptor 95% positive, progesterone receptor 95% positive, both with strong staining intensity, with an MIB-1 of 12%, and no HER-2 amplification, the signals ratio being 1.54 and the number per cell 2.15.  Her case was presented in the multidisciplinary breast cancer conference 03/12/2016.Marland Kitchen At that time a preliminary plan was proposed: Breast conserving surgery with sentinel lymph node sampling, no Oncotype if the tumor was no larger than 5 mm, and consideration of genetics testing.  On 03/26/2016 Sabrina Tapia underwent right lumpectomy and sentinel lymph node sampling. This showed ([id number]) an invasive ductal carcinoma measuring 1.4 cm, grade 1, with negative margins, and a macrometastatic deposit of ductal  carcinoma in the single sentinel lymph node. However there was a separate invasive lobular carcinoma measuring 0.6 cm focally involving the final right medial margin.  Her subsequent history is as detailed below.  INTERVAL HISTORY: Sabrina Tapia was evaluated in the breast clinic 04/01/2016 accompanied by her brother grade and her sister-in-law Sabrina Tapia  REVIEW OF SYSTEMS: There were no specific symptoms leading to the original mammogram, which was routinely scheduled. The patient denies unusual headaches, visual changes, nausea, vomiting, stiff neck, dizziness, or gait imbalance. There has been no cough, phlegm production, or pleurisy, no chest pain or pressure, and no change in bowel or bladder habits. The patient denies fever, rash, bleeding, unexplained fatigue or unexplained weight loss. Sabrina Tapia did well with the surgery except that the hydrocodone she took for pain control rib drop and will let her sleep, and the tape was very irritating to her skin. Aside from the cancer issues she does have some stress urinary incontinence, history of arthritis, with joint pains here and there which are not more intense or persistent than before, and moderate hot flashes. A detailed review of systems today was otherwise entirely negative.   PAST MEDICAL HISTORY: Past Medical History:  Diagnosis Date  . Arthritis   . Cervical polyp   . Detrusor instability   . GERD (gastroesophageal reflux disease)   . Heart burn   . Hemorrhoids   . Hypertonicity of bladder   . Joint pain   . LGSIL (low grade squamous intraepithelial dysplasia) 2001   colpo biopsy negative, negative paps since  . Osteopenia 02/2016   T score -1.3 FRAX 8.3%/0.7% stable from prior DEXA    PAST SURGICAL HISTORY: Past Surgical History:  Procedure Laterality Date  . RADIOACTIVE SEED GUIDED MASTECTOMY WITH AXILLARY SENTINEL LYMPH NODE BIOPSY Right 03/26/2016  Procedure: RIGHT BREAST LUMPECTOMY WITH RADIOACTIVE SEED AND SENTINEL LYMPH NODE BIOPSY;   Surgeon: Rolm Bookbinder, MD;  Location: [place];  Service: General;  Laterality: Right;    FAMILY HISTORY Family History  Problem Relation Age of Onset  . Hypertension Mother   . Heart disease Mother   . Lung disease Mother   . Hypertension Father   . Cancer Father     colon  . Heart disease Father   . Lung cancer Father   . Breast cancer Paternal Grandmother     Age 92's  . Hypertension Brother   . Hyperlipidemia Brother   . Diabetes Maternal Grandfather   The patient's father died from lung cancer at the age of 18. He also had a remote history of colon cancer. The patient's mother died at age 66. The patient has one brother, no sisters.  GYNECOLOGIC HISTORY:  No LMP recorded. Patient is postmenopausal. Menarche age 63, the patient is GX P0. 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 She lives alone, with no pets.    ADVANCED DIRECTIVES: In place. She has named her brother Sabrina Tapia as her healthcare part of attorney. He can be reached at [phone]/2006   HEALTH MAINTENANCE: Social History  Substance Use Topics  . Smoking status: Former Smoker    Quit date: 12/20/1984  . Smokeless tobacco: Never Used  . Alcohol use 7.2 oz/week    12 Cans of beer per week     Colonoscopy:February 2016  PAP:  Bone density: 03/04/2016/osteopenia   No Known Allergies  Current Outpatient Prescriptions  Medication Sig Dispense Refill  . acetaminophen (TYLENOL) 500 MG tablet Take 1,000 mg by mouth every 6 (six) hours as needed.    Marland Kitchen albuterol (PROAIR HFA) 108 (90 Base) MCG/ACT inhaler Inhale 2 puffs into the lungs every 4 (four) hours as needed for wheezing or shortness of breath. 1 Inhaler 1  . ALPRAZolam (XANAX) 0.25 MG tablet Take 1 tablet (0.25 mg total) by mouth at bedtime as needed. for  sleep 30 tablet 1  . Calcium Carbonate-Vitamin D (CALCIUM + D PO) Take by mouth.    . cetirizine (ZYRTEC) 10 MG tablet Take 10 mg by mouth.    . docusate sodium (COLACE) 50 MG capsule Take 50 mg by mouth.    . fluticasone (FLONASE) 50 MCG/ACT nasal spray Place 2 sprays into both nostrils daily. 16 g 5  . gabapentin (NEURONTIN) 300 MG capsule Take 100 mg by mouth at bedtime. Can take 1-3 at a time    . hydrocortisone (ANUSOL-HC) 2.5 % rectal cream Place 1 application rectally 4 (four) times daily. 30 g 0  . hydrocortisone (ANUSOL-HC) 25 MG suppository UNWRAP AND INSERT 1 SUPPOSITORY RECTALLY 2 TIMES DAILY.    Marland Kitchen lidocaine (XYLOCAINE) 5 % ointment Apply 1 application topically as needed. 35.44 g 0  . LUTEIN PO Take by mouth.    . Multiple Vitamin (MULTIVITAMIN) tablet Take 1 tablet by mouth daily.    . Omega-3 Fatty Acids (FISH OIL PO) Take by mouth.    . polyethylene glycol (MIRALAX / GLYCOLAX) packet Take 17 g by mouth daily.    Marland Kitchen PRILOSEC OTC 20 MG tablet Reported on 12/14/2015    . Probiotic Product (PROBIOTIC DAILY PO) Take by mouth.    Marland Kitchen QVAR 40 MCG/ACT inhaler  INHALE 2 PUFFS BY MOUTH TWICE DAILY TO PREVENT COUGH OR WHEEZING. RINSE, GARGLE AND SPIT AFTER USE 8.7 g 4  . solifenacin (VESICARE) 5 MG tablet Take 1 tablet (5 mg total) by mouth daily. 30 tablet 12  . Turmeric 450 MG CAPS Take by mouth.    . valACYclovir (VALTREX) 1000 MG tablet Take 1,000 mg by mouth 2 (two) times daily.    Marland Kitchen venlafaxine XR (EFFEXOR XR) 37.5 MG 24 hr capsule Take 1 capsule (37.5 mg total) by mouth daily with breakfast. 30 capsule 6   No current facility-administered medications for this visit.     OBJECTIVE: Middle-aged white woman who appears well Vitals:   04/01/16 1629  BP: 139/90  Pulse: 74  Resp: 18  Temp: 98.2 F (36.8 C)     Body mass index is 19.55 kg/m.    ECOG FS:1 - Symptomatic but completely ambulatory  Ocular: Sclerae unicteric, pupils equal, round and reactive to light Ear-nose-throat:  Oropharynx clear and moist Lymphatic: No cervical or supraclavicular adenopathy Lungs no rales or rhonchi, good excursion bilaterally Heart regular rate and rhythm, no murmur appreciated Abd soft, nontender, positive bowel sounds MSK no focal spinal tenderness, no joint edema Neuro: non-focal, well-oriented, appropriate affect Breasts: The right breast is status post recent lumpectomy. The incision is healing very nicely. The cosmetic result is excellent. There is mild ecchymosis associated with the recent procedure. The right axilla is benign. The left breast is unremarkable.   LAB RESULTS:  CMP     Component Value Date/Time   NA 137 04/01/2016 1612   K 3.9 04/01/2016 1612   CO2 24 04/01/2016 1612   GLUCOSE 108 04/01/2016 1612   BUN 12.0 04/01/2016 1612   CREATININE 0.8 04/01/2016 1612   CALCIUM 9.3 04/01/2016 1612   PROT 6.9 04/01/2016 1612   ALBUMIN 3.5 04/01/2016 1612   AST 53 (H) 04/01/2016 1612   ALT 92 (H) 04/01/2016 1612   ALKPHOS 84 04/01/2016 1612   BILITOT 0.34 04/01/2016 1612    INo results found for: SPEP, UPEP  Lab Results  Component Value Date   WBC 7.6 04/01/2016   NEUTROABS 4.6 04/01/2016   HGB 14.2 04/01/2016   HCT 42.2 04/01/2016   MCV 91.3 04/01/2016   PLT 376 04/01/2016      Chemistry      Component Value Date/Time   NA 137 04/01/2016 1612   K 3.9 04/01/2016 1612   CO2 24 04/01/2016 1612   BUN 12.0 04/01/2016 1612   CREATININE 0.8 04/01/2016 1612      Component Value Date/Time   CALCIUM 9.3 04/01/2016 1612   ALKPHOS 84 04/01/2016 1612   AST 53 (H) 04/01/2016 1612   ALT 92 (H) 04/01/2016 1612   BILITOT 0.34 04/01/2016 1612       No results found for: LABCA2  No components found for: LABCA125  No results for input(s): INR in the last 168 hours.  Urinalysis    Component Value Date/Time   COLORURINE YELLOW 01/31/2016 1526   APPEARANCEUR CLEAR 01/31/2016 1526   LABSPEC 1.006 01/31/2016 1526   PHURINE 6.5 01/31/2016 1526    GLUCOSEU NEGATIVE 01/31/2016 1526   HGBUR NEGATIVE 01/31/2016 1526   BILIRUBINUR NEGATIVE 01/31/2016 1526   KETONESUR NEGATIVE 01/31/2016 1526   PROTEINUR NEGATIVE 01/31/2016 1526   UROBILINOGEN 0.2 12/11/2014 1506   NITRITE NEGATIVE 01/31/2016 1526   LEUKOCYTESUR TRACE (A) 01/31/2016 1526     STUDIES: Nm Sentinel Node Inj-no Rpt (breast)  Result Date: 03/26/2016 CLINICAL  DATA: right axillary sn biopsy Sulfur colloid was injected intradermally by the nuclear medicine technologist for breast cancer sentinel node localization.    ELIGIBLE FOR AVAILABLE RESEARCH PROTOCOL: no  ASSESSMENT: 65 y.o. [place] woman status post right upper outer quadrant lumpectomy 03/26/2016 for a pT1c pN0, stage IA invasive ductal carcinoma, grade 1, estrogen and progesterone receptor positive, HER-2 nonamplified, with an MIB-1 of 12%   (1) a separate invasive lobular carcinoma, measuring 0.6 cm, focally involved the final right medial margin from the 03/26/2016 procedure, also grade 1, estrogen and progesterone receptor positive, HER-2 not available, with an MIB-1 of 3%  (2) breast MRI pending  (3) Mammaprint from the ductal breast cancer results pending  (4) definitive surgery pending  (5) radiation therapy to follow as appropriate  (6) anti-estrogens to follow completion of local treatment  PLAN: We spent the better part of today's hour-long appointment discussing the biology of breast cancer in general, and the specifics of the patient's tumor in particular. Sabrina Tapia understands that as far as her ductal breast cancer is concerned, it had her 80s spread to the single sentinel lymph node sampled. This makes it stage II. We reviewed the fact that this tumor was nonaggressive looking and slow-growing and that it "ate" estrogen and progesterone but not HER-2.  We then discussed the lobular breast cancer, which is very small, stage I also not aggressive looking and also slow-growing, the problem with this  plan is that it does touch one of the margins and this will need to be cleared.  We discussed the difference between ductal and lobular breast cancers and she understands that lobular breast cancers like the "glue" that makes hard lumps of ductal breast cancers. Lobular breast cancers are hard to feel and see on regular mammography, but they show up very well on breast MRI and that the next step Sabrina Tapia will need to take. This will allow her surgeon Dr. Donne Hazel to plan the surgery that is necessary--hopefully only further margin clearance.  She understands that she will need adjuvant radiation at some point to complete her local therapy..  We then discussed the rationale for systemic therapy. There is some risk that this cancer may have already spread to other parts of her body. Patients frequently ask at this point about bone scans, CAT scans and PET scans to find out if they have occult breast cancer somewhere else. The problem is that in early stage disease we are much more likely to find false positives then true cancers and this would expose the patient to unnecessary procedures as well as unnecessary radiation. Scans cannot answer the question the patient really would like to know, which is whether she has microscopic disease elsewhere in her body. For those reasons we do not recommend them.  Of course we would proceed to aggressive evaluation of any symptoms that might suggest metastatic disease, but that is not the case here.  Next we went over the options for systemic therapy which are anti-estrogens, anti-HER-2 immunotherapy, and chemotherapy. Sabrina Tapia does not meet criteria for anti-HER-2 immunotherapy. She is a good candidate for anti-estrogens.  The question of chemotherapy is more complicated. Chemotherapy is most effective in rapidly growing, aggressive tumors. It is much less effective in low-grade, slow growing cancers, like Sabrina Tapia's. For that reason we are going to request a Mammaprint from  the ductal surgical sample, as suggested by NCCN guidelines. That will help [place] make a definitive decision regarding chemotherapy in this case.  We hope to have the  MRI in the next few days and the Mammaprint within 2 weeks at the most, after which she can have the surgery. If the Mammaprint is high-risk she will need chemotherapy and then she would have a port placed at the time of margin clearance. My hope is that this will not be needed and that she can proceed directly from surgery to radiation. She would then start anti-estrogens at the completion of local treatment.  Sabrina Tapia has a good understanding of the overall plan. She agrees with it. She knows the goal of treatment in her case is cure. She will call with any problems that may develop before her next visit here.  Chauncey Cruel, MD   04/01/2016 5:57 PM Medical Oncology and Hematology [facility] [address] [place], [place] [address] Tel. [phone]    Fax. [phone]

## 2016-04-01 ENCOUNTER — Other Ambulatory Visit (HOSPITAL_BASED_OUTPATIENT_CLINIC_OR_DEPARTMENT_OTHER): Payer: BC Managed Care – PPO

## 2016-04-01 ENCOUNTER — Ambulatory Visit (HOSPITAL_BASED_OUTPATIENT_CLINIC_OR_DEPARTMENT_OTHER): Payer: BC Managed Care – PPO | Admitting: Oncology

## 2016-04-01 DIAGNOSIS — Z170 Estrogen receptor positive status [ER+]: Secondary | ICD-10-CM | POA: Diagnosis not present

## 2016-04-01 DIAGNOSIS — C50919 Malignant neoplasm of unspecified site of unspecified female breast: Secondary | ICD-10-CM

## 2016-04-01 DIAGNOSIS — C50411 Malignant neoplasm of upper-outer quadrant of right female breast: Secondary | ICD-10-CM

## 2016-04-01 LAB — CBC WITH DIFFERENTIAL/PLATELET
BASO%: 0.5 % (ref 0.0–2.0)
BASOS ABS: 0 10*3/uL (ref 0.0–0.1)
EOS%: 3.8 % (ref 0.0–7.0)
Eosinophils Absolute: 0.3 10*3/uL (ref 0.0–0.5)
HCT: 42.2 % (ref 34.8–46.6)
HGB: 14.2 g/dL (ref 11.6–15.9)
LYMPH%: 26.8 % (ref 14.0–49.7)
MCH: 30.7 pg (ref 25.1–34.0)
MCHC: 33.6 g/dL (ref 31.5–36.0)
MCV: 91.3 fL (ref 79.5–101.0)
MONO#: 0.6 10*3/uL (ref 0.1–0.9)
MONO%: 8.1 % (ref 0.0–14.0)
NEUT#: 4.6 10*3/uL (ref 1.5–6.5)
NEUT%: 60.8 % (ref 38.4–76.8)
PLATELETS: 376 10*3/uL (ref 145–400)
RBC: 4.62 10*6/uL (ref 3.70–5.45)
RDW: 13.9 % (ref 11.2–14.5)
WBC: 7.6 10*3/uL (ref 3.9–10.3)
lymph#: 2.1 10*3/uL (ref 0.9–3.3)

## 2016-04-01 LAB — COMPREHENSIVE METABOLIC PANEL
ALBUMIN: 3.5 g/dL (ref 3.5–5.0)
ALK PHOS: 84 U/L (ref 40–150)
ALT: 92 U/L — ABNORMAL HIGH (ref 0–55)
ANION GAP: 9 meq/L (ref 3–11)
AST: 53 U/L — ABNORMAL HIGH (ref 5–34)
BILIRUBIN TOTAL: 0.34 mg/dL (ref 0.20–1.20)
BUN: 12 mg/dL (ref 7.0–26.0)
CO2: 24 mEq/L (ref 22–29)
Calcium: 9.3 mg/dL (ref 8.4–10.4)
Chloride: 104 mEq/L (ref 98–109)
Creatinine: 0.8 mg/dL (ref 0.6–1.1)
EGFR: 74 mL/min/{1.73_m2} — AB (ref >90)
Glucose: 108 mg/dl (ref 70–140)
Potassium: 3.9 mEq/L (ref 3.5–5.1)
Sodium: 137 mEq/L (ref 136–145)
TOTAL PROTEIN: 6.9 g/dL (ref 6.4–8.3)

## 2016-04-02 ENCOUNTER — Telehealth: Payer: Self-pay | Admitting: *Deleted

## 2016-04-02 NOTE — Telephone Encounter (Signed)
Ordered mammaprint per Dr. Jana Hakim. Faxed requisition to pathology and [facility]. Confirmed receipt with Varney Biles.

## 2016-04-03 ENCOUNTER — Telehealth: Payer: Self-pay | Admitting: *Deleted

## 2016-04-03 NOTE — Telephone Encounter (Signed)
  Oncology Nurse Navigator Documentation  Navigator Location: [facility] (04/03/16 1400) Navigator Encounter Type: Introductory phone call (04/03/16 1400)  Confirmed breast MRI for 04/04/16 Abnormal Finding Date: 03/06/16 (04/03/16 1400) Confirmed Diagnosis Date: 03/06/16 (04/03/16 1400) Surgery Date: 03/26/16 (04/03/16 1400) Treatment Initiated Date: 03/26/16 (04/03/16 1400) Patient Visit Type: MedOnc (04/03/16 1400) Treatment Phase: Post-Tx Follow-up (04/03/16 1400)                  Acuity: Level 2 (04/03/16 1400)         Time Spent with Patient: 30 (04/03/16 1400)

## 2016-04-04 ENCOUNTER — Ambulatory Visit
Admission: RE | Admit: 2016-04-04 | Discharge: 2016-04-04 | Disposition: A | Discharge status: Home / Self Care | Payer: BC Managed Care – PPO | Source: Ambulatory Visit | Attending: Oncology | Admitting: Oncology

## 2016-04-04 DIAGNOSIS — C50411 Malignant neoplasm of upper-outer quadrant of right female breast: Secondary | ICD-10-CM

## 2016-04-04 MED ORDER — GADOBENATE DIMEGLUMINE 529 MG/ML IV SOLN
10.0000 mL | Freq: Once | INTRAVENOUS | Status: AC | PRN
  Administered 2016-04-04: 10 mL via INTRAVENOUS

## 2016-04-07 ENCOUNTER — Telehealth: Payer: Self-pay | Admitting: *Deleted

## 2016-04-07 NOTE — Telephone Encounter (Signed)
Pt scheduled for right and left breast US for abnormal findings on MRI. Denies further needs at this time.

## 2016-04-08 ENCOUNTER — Encounter (HOSPITAL_COMMUNITY): Payer: Self-pay

## 2016-04-08 ENCOUNTER — Other Ambulatory Visit: Payer: Self-pay | Admitting: General Surgery

## 2016-04-08 ENCOUNTER — Telehealth: Payer: Self-pay | Admitting: *Deleted

## 2016-04-08 DIAGNOSIS — R928 Other abnormal and inconclusive findings on diagnostic imaging of breast: Secondary | ICD-10-CM

## 2016-04-08 DIAGNOSIS — C50411 Malignant neoplasm of upper-outer quadrant of right female breast: Secondary | ICD-10-CM

## 2016-04-08 NOTE — Telephone Encounter (Signed)
Received Mammaprint results of Low Risk.  Placed a copy in Dr. Virgie Dad box, gave Varney Biles a copy & took a copy to HIM to scan.

## 2016-04-09 ENCOUNTER — Other Ambulatory Visit: Payer: Self-pay | Admitting: Radiology

## 2016-04-09 ENCOUNTER — Telehealth: Payer: Self-pay | Admitting: *Deleted

## 2016-04-09 NOTE — Telephone Encounter (Signed)
Left message for a return phone call to discuss low risk mammaprint results.

## 2016-04-11 ENCOUNTER — Ambulatory Visit
Admission: RE | Admit: 2016-04-11 | Discharge: 2016-04-11 | Disposition: A | Discharge status: Home / Self Care | Payer: BC Managed Care – PPO | Source: Ambulatory Visit | Attending: General Surgery | Admitting: General Surgery

## 2016-04-11 ENCOUNTER — Other Ambulatory Visit: Payer: Self-pay | Admitting: General Surgery

## 2016-04-11 DIAGNOSIS — R928 Other abnormal and inconclusive findings on diagnostic imaging of breast: Secondary | ICD-10-CM

## 2016-04-11 DIAGNOSIS — C50411 Malignant neoplasm of upper-outer quadrant of right female breast: Secondary | ICD-10-CM

## 2016-04-11 MED ORDER — GADOBENATE DIMEGLUMINE 529 MG/ML IV SOLN
15.0000 mL | Freq: Once | INTRAVENOUS | Status: AC | PRN
  Administered 2016-04-11: 15 mL via INTRAVENOUS

## 2016-04-14 ENCOUNTER — Other Ambulatory Visit: Payer: Self-pay | Admitting: Oncology

## 2016-04-14 ENCOUNTER — Other Ambulatory Visit: Payer: Self-pay | Admitting: Radiology

## 2016-04-16 ENCOUNTER — Ambulatory Visit: Payer: BC Managed Care – PPO

## 2016-04-16 ENCOUNTER — Other Ambulatory Visit: Payer: Self-pay | Admitting: Oncology

## 2016-04-16 ENCOUNTER — Ambulatory Visit: Payer: BC Managed Care – PPO | Admitting: Radiation Oncology

## 2016-04-16 ENCOUNTER — Telehealth: Payer: Self-pay | Admitting: *Deleted

## 2016-04-16 NOTE — Telephone Encounter (Signed)
Ordered mammaprint per Dr. Jana Hakim on Left axillary lymph node.  Faxed requisition to [facility] and pathology and confirmed receipt.

## 2016-04-18 ENCOUNTER — Ambulatory Visit (HOSPITAL_BASED_OUTPATIENT_CLINIC_OR_DEPARTMENT_OTHER): Payer: BC Managed Care – PPO | Admitting: Oncology

## 2016-04-18 VITALS — BP 134/75 | HR 75 | Temp 98.4°F | Resp 18 | Wt 143.4 lb

## 2016-04-18 DIAGNOSIS — M8580 Other specified disorders of bone density and structure, unspecified site: Secondary | ICD-10-CM

## 2016-04-18 DIAGNOSIS — C773 Secondary and unspecified malignant neoplasm of axilla and upper limb lymph nodes: Secondary | ICD-10-CM

## 2016-04-18 DIAGNOSIS — C50112 Malignant neoplasm of central portion of left female breast: Secondary | ICD-10-CM

## 2016-04-18 DIAGNOSIS — Z170 Estrogen receptor positive status [ER+]: Secondary | ICD-10-CM | POA: Diagnosis not present

## 2016-04-18 DIAGNOSIS — C50412 Malignant neoplasm of upper-outer quadrant of left female breast: Secondary | ICD-10-CM

## 2016-04-18 DIAGNOSIS — C50411 Malignant neoplasm of upper-outer quadrant of right female breast: Secondary | ICD-10-CM | POA: Diagnosis not present

## 2016-04-18 DIAGNOSIS — Z171 Estrogen receptor negative status [ER-]: Secondary | ICD-10-CM

## 2016-04-18 NOTE — Progress Notes (Signed)
[place]  [phone] Fax:([phone]     ID: SUZANN LAZARO DOB: 1951-02-28  MR#: [id number]  VOJ#:[id number]  Patient Care Team: Josetta Huddle, MD as PCP - General (Internal Medicine) Rolm Bookbinder, MD as Consulting Physician (General Surgery) Chauncey Cruel, MD as Consulting Physician (Oncology) Anastasio Auerbach, MD as Consulting Physician (Gynecology) OTHER MD:  CHIEF COMPLAINT: Estrogen receptor positive breast cancer  CURRENT TREATMENT: Awaiting definitive surgery   BREAST CANCER HISTORY: "[name]" had screening mammography at her gynecologist's office suggestive of a change in the upper-outer quadrant of the right breast. She was referred to [facility] where on 03/04/2016 she underwent bilateral diagnostic mammography and right breast ultrasonography. The breast density was category C. In the upper outer quadrant of the right breast there was a new area of architectural distortion. By ultrasonography this measured 0.5 cm. The right axilla was reported as sonographically benign.  On 03/06/2016 she underwent core needle biopsy of the right breast mass in question, and this showed ([id number]) an invasive ductal carcinoma, grade 1, estrogen receptor 95% positive, progesterone receptor 95% positive, both with strong staining intensity, with an MIB-1 of 12%, and no HER-2 amplification, the signals ratio being 1.54 and the number per cell 2.15.  Her case was presented in the multidisciplinary breast cancer conference 03/12/2016.Marland Kitchen At that time a preliminary plan was proposed: Breast conserving surgery with sentinel lymph node sampling, no Oncotype if the tumor was no larger than 5 mm, and consideration of genetics testing.  On 03/26/2016 [name] underwent right lumpectomy and sentinel lymph node sampling. This showed ([phone]) an invasive ductal carcinoma measuring 1.4 cm, grade 1, with negative margins, and a macrometastatic deposit of ductal  carcinoma in the single sentinel lymph node. However there was a separate invasive lobular carcinoma measuring 0.6 cm focally involving the final right medial margin.  Her subsequent history is as detailed below.  INTERVAL HISTORY: Alison Murray returns today accompanied by her brother grade and her sister-in-law Bethena Roys. Her story has taken many twists and turns so it is a good idea to summarize it now.  On 03/06/2016 she had her initial right breast core biopsy, which showed an invasive ductal carcinoma, grade 1, estrogen and progesterone receptor strongly positive, with an MIB-1 of 12%, and no HER-2 amplification ([id number]). Then on 03/26/2016 she underwent right lumpectomy and sentinel lymph node sampling. This procedure (SZA [id number]) confirmed a 1.4 cm invasive ductal carcinoma, grade 1, with negative margins. However the single sentinel lymph node was positive. Mammaprint from this tumor was read as "low risk".  In addition an unsuspected invasive lobular breast cancer, grade 1, was found focally involving the medial margin. This second ipsilateral breast cancer was estrogen and progesterone receptor 100% positive, with strong staining intensity, with an MIB-1 of 3%, and no HER-2 amplification, the signals ratio being 1.29 and the number per cell 1.80.  Because of the lobular breast cancer bilateral breast MRI was obtained 04/04/2016. This showed in the right breast a 7.4 x 4.5 cm seroma with 2 enhancing masses posterior to the lumpectomy cavity, the larger measuring 1.0 cm. These were felt possibly to be reactive lymph nodes. One of these lymph nodes was biopsied 04/09/2016 and this showed ([id number]) benign lymph node tissue. The MRI showed no evidence of additional disease in the right breast.  However in the left breast centrally there was a 1.3 cm enhancing mass. Biopsy of this left breast mass 04/09/2016 ([id number]) showed an invasive ductal carcinoma,  grade 1 or 2, with insufficient tissue  for prognostic panel. On 04/11/2016 biopsy of a second left breast mass, upper outer quadrant,  4.6 cm a way from the other left breast mass, showed ([id number]) and invasive ductal carcinoma, grade 2, estrogen receptor 10% positive with strong staining intensity, progesterone receptor negative, with an MIB-1 of 2%, and no HER-2 amplification, the signals ratio being 1.50 and the number per cell 2.10. The proliferation fraction was 2%.  Finally on 04/14/2016 [name] underwent biopsy of the suspicious left axillary lymph node and this ([id number]) was positive for invasive ductal carcinoma, estrogen and progesterone receptor negative, with an MIB-1 of 10%. I do not find that HER-2 was repeated  [name]'s case was again presented at the breast cancer multidisciplinary conference 04/16/2016. It was felt that additional right lumpectomy and 2 separate left lumpectomy could be considered vs.proceeding with bilateral mastectomies  REVIEW OF SYSTEMS: Alison Murray has done remarkably well with all her biopsies. She is understandably somewhat anxious and is having some difficulty deciding what to do. However pain, bleeding, or fever are not major issues. A detailed review of systems today was otherwise stable   PAST MEDICAL HISTORY: Past Medical History:  Diagnosis Date  . Arthritis   . Cervical polyp   . Detrusor instability   . GERD (gastroesophageal reflux disease)   . Heart burn   . Hemorrhoids   . Hypertonicity of bladder   . Joint pain   . LGSIL (low grade squamous intraepithelial dysplasia) 2001   colpo biopsy negative, negative paps since  . Osteopenia 02/2016   T score -1.3 FRAX 8.3%/0.7% stable from prior DEXA    PAST SURGICAL HISTORY: Past Surgical History:  Procedure Laterality Date  . RADIOACTIVE SEED GUIDED MASTECTOMY WITH AXILLARY SENTINEL LYMPH NODE BIOPSY Right 03/26/2016   Procedure: RIGHT BREAST LUMPECTOMY WITH RADIOACTIVE SEED AND SENTINEL LYMPH NODE BIOPSY;  Surgeon: Rolm Bookbinder, MD;  Location: [place];  Service: General;  Laterality: Right;    FAMILY HISTORY Family History  Problem Relation Age of Onset  . Hypertension Mother   . Heart disease Mother   . Lung disease Mother   . Hypertension Father   . Cancer Father     colon  . Heart disease Father   . Lung cancer Father   . Breast cancer Paternal Grandmother     Age 8's  . Hypertension Brother   . Hyperlipidemia Brother   . Diabetes Maternal Grandfather   The patient's father died from lung cancer at the age of 82. He also had a remote history of colon cancer. The patient's mother died at age 21. The patient has one brother, no sisters.  GYNECOLOGIC HISTORY:  No LMP recorded. Patient is postmenopausal. Menarche age 22, the patient is GX P0. 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 She lives alone, with no pets.    ADVANCED DIRECTIVES: In place. She has named her brother Marya Amsler as her healthcare part of attorney. He can be reached at [phone]   HEALTH MAINTENANCE: Social History  Substance Use Topics  . Smoking status: Former Smoker    Quit date: 12/20/1984  . Smokeless tobacco: Never Used  . Alcohol use 7.2 oz/week    12 Cans of beer per week     Colonoscopy:February 2016  PAP:  Bone density: 03/04/2016/osteopenia   No Known Allergies  Current Outpatient Prescriptions  Medication Sig Dispense Refill  . acetaminophen (TYLENOL) 500 MG tablet Take 1,000 mg by mouth every 6 (six) hours as needed.    Marland Kitchen albuterol (PROAIR HFA) 108 (90 Base) MCG/ACT inhaler Inhale 2 puffs into the lungs every 4 (four) hours as needed for wheezing or shortness of breath. 1 Inhaler 1  . ALPRAZolam (XANAX) 0.25 MG tablet Take 1 tablet (0.25 mg total) by mouth at bedtime as needed. for sleep 30 tablet 1    . Calcium Carbonate-Vitamin D (CALCIUM + D PO) Take by mouth.    . cetirizine (ZYRTEC) 10 MG tablet Take 10 mg by mouth.    . docusate sodium (COLACE) 50 MG capsule Take 50 mg by mouth.    . fluticasone (FLONASE) 50 MCG/ACT nasal spray Place 2 sprays into both nostrils daily. 16 g 5  . gabapentin (NEURONTIN) 300 MG capsule Take 100 mg by mouth at bedtime. Can take 1-3 at a time    . hydrocortisone (ANUSOL-HC) 2.5 % rectal cream Place 1 application rectally 4 (four) times daily. 30 g 0  . hydrocortisone (ANUSOL-HC) 25 MG suppository UNWRAP AND INSERT 1 SUPPOSITORY RECTALLY 2 TIMES DAILY.    Marland Kitchen lidocaine (XYLOCAINE) 5 % ointment Apply 1 application topically as needed. 35.44 g 0  . LUTEIN PO Take by mouth.    . Multiple Vitamin (MULTIVITAMIN) tablet Take 1 tablet by mouth daily.    . Omega-3 Fatty Acids (FISH OIL PO) Take by mouth.    . polyethylene glycol (MIRALAX / GLYCOLAX) packet Take 17 g by mouth daily.    Marland Kitchen PRILOSEC OTC 20 MG tablet Reported on 12/14/2015    . Probiotic Product (PROBIOTIC DAILY PO) Take by mouth.    Marland Kitchen QVAR 40 MCG/ACT inhaler INHALE 2 PUFFS BY MOUTH TWICE DAILY TO PREVENT COUGH OR WHEEZING. RINSE, GARGLE AND SPIT AFTER USE 8.7 g 4  . solifenacin (VESICARE) 5 MG tablet Take 1 tablet (5 mg total) by mouth daily. 30 tablet 12  . Turmeric 450 MG CAPS Take by mouth.    . valACYclovir (VALTREX) 1000 MG tablet Take 1,000 mg by mouth 2 (two) times daily.    Marland Kitchen venlafaxine XR (EFFEXOR XR) 37.5 MG 24 hr capsule Take 1 capsule (37.5 mg total) by mouth daily with breakfast. 30 capsule 6   No current facility-administered medications for this visit.     OBJECTIVE: Middle-aged white woman In no acute distress Vitals:   04/18/16 1546  BP: 134/75  Pulse: 75  Resp: 18  Temp: 98.4 F (36.9 C)     Body mass index is 24.61 kg/m.    ECOG FS:1 - Symptomatic but completely ambulatory  Sclerae unicteric, pupils round and equal Oropharynx clear and moist-- no thrush or other lesions No  cervical or supraclavicular adenopathy Lungs no rales or rhonchi Heart regular rate and rhythm Abd soft, nontender, positive bowel sounds MSK no focal spinal tenderness, no upper extremity lymphedema Neuro: nonfocal, well oriented, appropriate affect Breasts: Deferred  AB RESULTS:  CMP     Component Value Date/Time   NA 137 04/01/2016 1612   K 3.9 04/01/2016 1612   CO2 24 04/01/2016 1612   GLUCOSE 108 04/01/2016 1612   BUN 12.0 04/01/2016 1612   CREATININE 0.8 04/01/2016 1612   CALCIUM 9.3 04/01/2016 1612   PROT 6.9 04/01/2016 1612   ALBUMIN 3.5 04/01/2016 1612   AST 53 (H) 04/01/2016 1612   ALT 92 (H)  04/01/2016 1612   ALKPHOS 84 04/01/2016 1612   BILITOT 0.34 04/01/2016 1612    INo results found for: SPEP, UPEP  Lab Results  Component Value Date   WBC 7.6 04/01/2016   NEUTROABS 4.6 04/01/2016   HGB 14.2 04/01/2016   HCT 42.2 04/01/2016   MCV 91.3 04/01/2016   PLT 376 04/01/2016      Chemistry      Component Value Date/Time   NA 137 04/01/2016 1612   K 3.9 04/01/2016 1612   CO2 24 04/01/2016 1612   BUN 12.0 04/01/2016 1612   CREATININE 0.8 04/01/2016 1612      Component Value Date/Time   CALCIUM 9.3 04/01/2016 1612   ALKPHOS 84 04/01/2016 1612   AST 53 (H) 04/01/2016 1612   ALT 92 (H) 04/01/2016 1612   BILITOT 0.34 04/01/2016 1612       No results found for: LABCA2  No components found for: LABCA125  No results for input(s): INR in the last 168 hours.  Urinalysis    Component Value Date/Time   COLORURINE YELLOW 01/31/2016 1526   APPEARANCEUR CLEAR 01/31/2016 1526   LABSPEC 1.006 01/31/2016 1526   PHURINE 6.5 01/31/2016 1526   GLUCOSEU NEGATIVE 01/31/2016 1526   HGBUR NEGATIVE 01/31/2016 [place] 01/31/2016 1526   [place] 01/31/2016 1526   PROTEINUR NEGATIVE 01/31/2016 1526   UROBILINOGEN 0.2 12/11/2014 1506   NITRITE NEGATIVE 01/31/2016 1526   LEUKOCYTESUR TRACE (A) 01/31/2016 1526     STUDIES: Mr  Breast Bilateral W Wo Contrast  Result Date: 04/04/2016 CLINICAL DATA:  65 year old female with recent diagnosis of right breast cancer which was detected on screening mammography. Ultrasound-guided biopsy indicated invasive ductal carcinoma, and the patient underwent a lumpectomy on 03/26/2016. The surgical specimen did show invasive ductal carcinoma measuring 1.4 cm with negative margins and a macrometastatic deposit of ductal carcinoma in a single sentinel lymph node. However, a separate site of invasive lobular carcinoma measuring 0.6 cm was identified involving the final right medial margin. The patient does have family history in her grandmother. LABS:  Not applicable. EXAM: BILATERAL BREAST MRI WITH AND WITHOUT CONTRAST TECHNIQUE: Multiplanar, multisequence MR images of both breasts were obtained prior to and following the intravenous administration of 10 ml of MultiHance. THREE-DIMENSIONAL MR IMAGE RENDERING ON INDEPENDENT WORKSTATION: Three-dimensional MR images were rendered by post-processing of the original MR data on an independent workstation. The three-dimensional MR images were interpreted, and findings are reported in the following complete MRI report for this study. Three dimensional images were evaluated at the independent DynaCad workstation COMPARISON:  No prior MRI available for comparison. Correlation made with prior mammograms and ultrasounds. FINDINGS: Breast composition: c. Heterogeneous fibroglandular tissue. Background parenchymal enhancement: Mild. Right breast: The large seroma is noted in the upper-outer quadrant of the right breast, corresponding with the recent lumpectomy measuring approximately 7.4 x 4.5 cm in axial plane. There are 2 enhancing masses posterior to the lumpectomy cavity measuring 1.0 and 0.9 cm respectively (series 6, images 72 and 80). These are bright on T2 weighted imaging, and could represent reactive lymph nodes, though if there are lymph nodes, disease  involvement cannot be excluded. Asymmetric irregular enhancement is noted along the superomedial and posterior margin of the lumpectomy cavity. There is no evidence of distant sites of disease within the right breast. Left breast: In the central left breast, posterior depth there is a 1.3 cm irregular enhancing mass with persistent kinetics (series 6, image 74). Lymph nodes: Again, there  are 2 masses posterior to the lumpectomy site in the right breast as described above which may represent abnormal or reactive lymph nodes. No other abnormal appearing axillary or internal mammary lymph nodes are identified. Ancillary findings:  None. IMPRESSION: 1. There is a 7.4 cm seroma in the upper-outer quadrant of the right breast corresponding with the patient's recent lumpectomy. There is irregular peripheral enhancement along the superior and posterior margin of the lumpectomy cavity. This could be postsurgical, however is asymmetric to the rest of the seroma border. 2. There are 2 enhancing masses measuring 1.0 and 0.9 cm posterior to the lumpectomy cavity, suspected to represent lymph nodes. These may either be reactive or metastatic. 3. There is an indeterminate 1.3 cm mass in the central left breast. RECOMMENDATION: 1. Second-look ultrasound may be performed for the 2 enhancing masses at the posterior aspect of the lumpectomy cavity, with subsequent biopsy if clinically relevant. 2. Second-look ultrasound is recommended for the 1.3 cm mass in the central left breast. If not visualized sonographically, MRI guided biopsy is recommended. BI-RADS CATEGORY  4: Suspicious. Electronically Signed   By: Ammie Ferrier M.D.   On: 04/04/2016 12:08   Nm Sentinel Node Inj-no Rpt (breast)  Result Date: 03/26/2016 CLINICAL DATA: right axillary sn biopsy Sulfur colloid was injected intradermally by the nuclear medicine technologist for breast cancer sentinel node localization.   Mm Digital Diagnostic Unilat L  Result Date:  04/11/2016 CLINICAL DATA:  Status post MR guided core needle biopsy of a 1.3 cm enhancing mass in the posterior aspect of the upper-outer quadrant of the left breast. EXAM: DIAGNOSTIC LEFT MAMMOGRAM POST MRI BIOPSY COMPARISON:  Previous exam(s). FINDINGS: Mammographic images were obtained following stereotactic guided biopsy of a 1.3 cm enhancing mass in the posterior aspect of the upper-outer quadrant of the left breast. These demonstrate an hourglass shaped biopsy marker clip at the location of the biopsied mass. This is located 4.6 cm superior and 3.1 cm posterior and medial to the recently placed key shaped biopsy marker clip. IMPRESSION: Appropriate clip deployment. Final Assessment: Post Procedure Mammograms for Marker Placement Electronically Signed   By: Claudie Revering M.D.   On: 04/11/2016 11:14   Mr Aundra Millet Breast Bx Johnella Moloney Dev 1st Lesion Image Bx Spec Mr Guide  Addendum Date: 04/14/2016   ADDENDUM REPORT: 04/14/2016 13:26 ADDENDUM: Pathology revealed GRADE II INVASIVE DUCTAL CARCINOMA, DUCTAL CARCINOMA IN SITU WITH ASSOCIATED CALCIFICATIONS of the upper outer quadrant of the Left breast. This was found to be concordant by Dr. Claudie Revering. Pathology results were discussed with the patient by telephone. The patient reported doing well after the biopsy with tenderness at the site. Post biopsy instructions and care were reviewed and questions were answered. The patient was encouraged to call [facility] for any additional concerns. The patient has a recent diagnosis of bilateral breast cancer and should follow her outlined treatment plan. Pathology results reported by Terie Purser, RN on 04/14/2016. Electronically Signed   By: Claudie Revering M.D.   On: 04/14/2016 13:26   Result Date: 04/14/2016 CLINICAL DATA:  1.3 cm enhancing mass in the posterior aspect of the central left breast slightly superiorly and slightly laterally on a recent MRI. Status post recent right lumpectomy for breast  cancer with an interval left breast cancer found based on an ultrasound finding at [facility]. EXAM: MRI GUIDED CORE NEEDLE BIOPSY OF THE LEFT BREAST TECHNIQUE: Multiplanar, multisequence MR imaging of the left breast was performed both before and  after administration of intravenous contrast. CONTRAST:  73m MULTIHANCE GADOBENATE DIMEGLUMINE 529 MG/ML IV SOLN COMPARISON:  Previous exams. FINDINGS: I met with the patient, and we discussed the procedure of MRI guided biopsy, including risks, benefits, and alternatives. Specifically, we discussed the risks of infection, bleeding, tissue injury, clip migration, and inadequate sampling. Informed, written consent was given. The usual time out protocol was performed immediately prior to the procedure. Using sterile technique, 1% Lidocaine, MRI guidance, and a 9 gauge vacuum assisted device, biopsy was performed of the recently demonstrated 1.3 cm enhancing mass in the posterior aspect of the left breast slightly superiorly and laterally using a lateral approach. At the conclusion of the procedure, an hourglass shaped tissue marker clip was deployed into the biopsy cavity. Follow-up 2-view mammogram was performed and dictated separately. IMPRESSION: MRI guided biopsy of a 1.3 cm mass in the posterior aspect of the upper-outer quadrant of the left breast. No apparent complications. Electronically Signed: By: SClaudie ReveringM.D. On: 04/11/2016 10:50    ELIGIBLE FOR AVAILABLE RESEARCH PROTOCOL: no  ASSESSMENT: 65y.o. [place] woman status post right breast upper outer quadrant lumpectomy 03/26/2016 for a pT1c pN1, stage IIA invasive ductal carcinoma, grade 1, estrogen and progesterone receptor positive, HER-2 nonamplified, with an MIB-1 of 12%   (a) mammaprint from this tumor was read as "low risk".  (1) a second right breast cancer, invasive lobular, grade 1, measuring 0.6 cm, focally involved the final right medial margin from the 03/26/2016 procedure; this tumor also  was estrogen and progesterone receptor positive, HER-2 negative, with an MIB-1 of 3%  (2) two biopsies from the left breast 04/09/2016 and 04/11/2016, 4.6 cm apart, showed  (a) centrally, an invasive ductal carcinoma, grade 1 or 2, with no prognostic panel available  (b) in the upper outer quadrant, invasive ductal carcinoma, grade 2, estrogen receptor 10% positive, progesterone receptor and HER-2 negative, with an MIB-1 of 2%  (c) left axillary lymph node biopsy 04/14/2016 showed invasive ductal carcinoma, estrogen and progesterone receptor negative  (d) mammaprint from (2)(c) pending  (3) definitive surgery pending  (4) radiation therapy to follow as appropriate  (5) tamoxifen started 04/18/2016  (6) genetics consultation being scheduled  PLAN; [facility] going over her complicated situation. It is best to start by saying what her preferences are. She says she would prefer lumpectomies, even multiple lumpectomies, 2 mastectomies. She also strongly would prefer to avoid chemotherapy.  We divided her choices into local treatment options and systemic treatment options. In terms of local treatment she understands there is no survival advantage to mastectomy versus lumpectomies. She is hoping it will be feasible to remove the cancers from both breasts with adequate margins and also with an adequate cosmetic result. She may need some oncoplasty but she would be agreeable to that. We are not recommending full axillary lymph node dissection on either side. A targeted axillary dissection on the left in my opinion would be adequate.  She understands if she has lumpectomies she will need bilateral adjuvant radiation.  In terms of systemic therapy, she does not meet criteria for anti-HER-2 immunotherapy. The right sided invasive ductal carcinoma was Mammaprint "low risk". Those patients, even if lymph node positive, tend to do very well without chemotherapy, with a 5  year distant disease-free survival in excess of 95% using anti-estrogens alone. The lobular breast cancer is less than a centimeter, low-grade, with a very low proliferation fraction, and in my opinion  does not warrant chemotherapy. Accordingly I am not requesting an Oncotype.  The left breast tumor is essentially triple negative. We have requested a Mammaprint on this and it is pending. She understands that if it comes back high risk I will strongly recommend chemotherapy and today we discussed the specifics which would be doxorubicin and cyclophosphamide in dose dense fashion 4 followed by paclitaxel weekly 12. If this tumor however turns out to be also low risk (it has a very low perforation fraction) then her only systemic treatment would be anti-estrogens.  We discussed the fact that even if it turns out that she carries a deleterious mutation that would put her at high risk of developing another breast cancers in the future bilateral mastectomies are not mandatory, since we can intensify screening with yearly breast MRI in addition to mammography/tomography. That would be her preference.   It may be useful to start anti-estrogens now, so that she does not feel she needs to rush into any decision. Starting anti-estrogens neoadjuvantly would not compromise her participation in the surgical [employer] trial if she later turns out to need neoadjuvant chemotherapy: The trial allows for up to 8 weeks of neoadjuvant anti-estrogens before neoadjuvant chemotherapy.  We then discussed the difference between anastrozole and tamoxifen in detail. She has a good understanding of the possible toxicities, side effects and complications of these agents. She expressed a strong reference for tamoxifen and I have gone ahead and place a prescription for her.  I will see her again in approximately 6 weeks, by which time I expect she will have had her surgery. Of course if the Mammaprint comes back high risk on the left  she will see me earlier to discuss neoadjuvant chemotherapy  Alison Murray has a good understanding of this plan. She agrees with it. She knows the goal of treatment in her case is cure. She will call with any problems that may develop before her next visit here.      :Chauncey Cruel, MD   04/19/2016 5:15 PM Medical Oncology and Hematology [facility] [place], [place] [address] Tel. [phone]    Fax. [phone]

## 2016-04-19 DIAGNOSIS — C50411 Malignant neoplasm of upper-outer quadrant of right female breast: Secondary | ICD-10-CM | POA: Insufficient documentation

## 2016-04-19 DIAGNOSIS — Z170 Estrogen receptor positive status [ER+]: Secondary | ICD-10-CM | POA: Insufficient documentation

## 2016-04-19 MED ORDER — TAMOXIFEN CITRATE 20 MG PO TABS: 20.0000 mg | ORAL_TABLET | Freq: Every day | ORAL | 12 refills | Status: DC

## 2016-04-25 ENCOUNTER — Other Ambulatory Visit: Payer: Self-pay | Admitting: General Surgery

## 2016-04-25 ENCOUNTER — Ambulatory Visit (INDEPENDENT_AMBULATORY_CARE_PROVIDER_SITE_OTHER): Payer: BC Managed Care – PPO | Admitting: Psychology

## 2016-04-25 DIAGNOSIS — F4320 Adjustment disorder, unspecified: Secondary | ICD-10-CM | POA: Diagnosis not present

## 2016-04-25 DIAGNOSIS — C50912 Malignant neoplasm of unspecified site of left female breast: Secondary | ICD-10-CM

## 2016-04-28 ENCOUNTER — Encounter (HOSPITAL_COMMUNITY): Payer: Self-pay

## 2016-04-29 ENCOUNTER — Telehealth: Payer: Self-pay | Admitting: *Deleted

## 2016-04-29 NOTE — Telephone Encounter (Signed)
Called pt with LOW RISK mammaprint results on Left axillary LN bx. Confirmed appt with Dr. Dennison Nancy. Discussed pt will receive a new appt to see Dr. Jana Hakim after xrt. Received verbal understanding.

## 2016-05-02 ENCOUNTER — Ambulatory Visit: Payer: BC Managed Care – PPO | Admitting: Oncology

## 2016-05-02 ENCOUNTER — Ambulatory Visit: Payer: BC Managed Care – PPO | Admitting: Psychology

## 2016-05-14 ENCOUNTER — Encounter (HOSPITAL_BASED_OUTPATIENT_CLINIC_OR_DEPARTMENT_OTHER): Payer: Self-pay | Admitting: *Deleted

## 2016-05-19 ENCOUNTER — Ambulatory Visit (INDEPENDENT_AMBULATORY_CARE_PROVIDER_SITE_OTHER): Payer: BC Managed Care – PPO | Admitting: Psychology

## 2016-05-19 DIAGNOSIS — F332 Major depressive disorder, recurrent severe without psychotic features: Secondary | ICD-10-CM | POA: Diagnosis not present

## 2016-05-19 NOTE — Pre-Procedure Instructions (Signed)
Pt given Boost Breeze drink with instruction to refrigerate and complete drink by 0430 am of surgery.

## 2016-05-20 ENCOUNTER — Ambulatory Visit (HOSPITAL_COMMUNITY): Payer: BC Managed Care – PPO

## 2016-05-20 ENCOUNTER — Other Ambulatory Visit: Payer: Self-pay | Admitting: Oncology

## 2016-05-20 ENCOUNTER — Encounter (HOSPITAL_BASED_OUTPATIENT_CLINIC_OR_DEPARTMENT_OTHER)
Admission: RE | Disposition: A | Discharge status: Home / Self Care | Payer: Self-pay | Source: Ambulatory Visit | Attending: General Surgery

## 2016-05-20 ENCOUNTER — Encounter (HOSPITAL_BASED_OUTPATIENT_CLINIC_OR_DEPARTMENT_OTHER): Payer: Self-pay | Admitting: *Deleted

## 2016-05-20 ENCOUNTER — Ambulatory Visit (HOSPITAL_BASED_OUTPATIENT_CLINIC_OR_DEPARTMENT_OTHER): Payer: BC Managed Care – PPO | Admitting: Anesthesiology

## 2016-05-20 ENCOUNTER — Ambulatory Visit (HOSPITAL_BASED_OUTPATIENT_CLINIC_OR_DEPARTMENT_OTHER)
Admission: RE | Admit: 2016-05-20 | Discharge: 2016-05-20 | Disposition: A | Discharge status: Home / Self Care | Payer: BC Managed Care – PPO | Source: Ambulatory Visit | Attending: General Surgery | Admitting: General Surgery

## 2016-05-20 ENCOUNTER — Encounter (HOSPITAL_COMMUNITY)
Admission: RE | Admit: 2016-05-20 | Discharge: 2016-05-20 | Disposition: A | Discharge status: Home / Self Care | Payer: BC Managed Care – PPO | Source: Ambulatory Visit | Attending: General Surgery | Admitting: General Surgery

## 2016-05-20 DIAGNOSIS — J45909 Unspecified asthma, uncomplicated: Secondary | ICD-10-CM | POA: Diagnosis not present

## 2016-05-20 DIAGNOSIS — Z8249 Family history of ischemic heart disease and other diseases of the circulatory system: Secondary | ICD-10-CM | POA: Diagnosis not present

## 2016-05-20 DIAGNOSIS — F419 Anxiety disorder, unspecified: Secondary | ICD-10-CM | POA: Diagnosis not present

## 2016-05-20 DIAGNOSIS — C50411 Malignant neoplasm of upper-outer quadrant of right female breast: Secondary | ICD-10-CM | POA: Diagnosis not present

## 2016-05-20 DIAGNOSIS — N6011 Diffuse cystic mastopathy of right breast: Secondary | ICD-10-CM | POA: Diagnosis not present

## 2016-05-20 DIAGNOSIS — C773 Secondary and unspecified malignant neoplasm of axilla and upper limb lymph nodes: Secondary | ICD-10-CM | POA: Insufficient documentation

## 2016-05-20 DIAGNOSIS — Z170 Estrogen receptor positive status [ER+]: Secondary | ICD-10-CM | POA: Diagnosis not present

## 2016-05-20 DIAGNOSIS — Z833 Family history of diabetes mellitus: Secondary | ICD-10-CM | POA: Diagnosis not present

## 2016-05-20 DIAGNOSIS — M8580 Other specified disorders of bone density and structure, unspecified site: Secondary | ICD-10-CM | POA: Diagnosis not present

## 2016-05-20 DIAGNOSIS — C50412 Malignant neoplasm of upper-outer quadrant of left female breast: Secondary | ICD-10-CM | POA: Insufficient documentation

## 2016-05-20 DIAGNOSIS — Z800 Family history of malignant neoplasm of digestive organs: Secondary | ICD-10-CM | POA: Insufficient documentation

## 2016-05-20 DIAGNOSIS — K219 Gastro-esophageal reflux disease without esophagitis: Secondary | ICD-10-CM | POA: Insufficient documentation

## 2016-05-20 DIAGNOSIS — Z87891 Personal history of nicotine dependence: Secondary | ICD-10-CM | POA: Insufficient documentation

## 2016-05-20 DIAGNOSIS — M1990 Unspecified osteoarthritis, unspecified site: Secondary | ICD-10-CM | POA: Diagnosis not present

## 2016-05-20 DIAGNOSIS — C50912 Malignant neoplasm of unspecified site of left female breast: Secondary | ICD-10-CM

## 2016-05-20 HISTORY — PX: RE-EXCISION OF BREAST CANCER,SUPERIOR MARGINS: SHX6047

## 2016-05-20 HISTORY — DX: Unspecified asthma, uncomplicated: J45.909

## 2016-05-20 HISTORY — PX: RADIOACTIVE SEED GUIDED PARTIAL MASTECTOMY WITH AXILLARY SENTINEL LYMPH NODE BIOPSY: SHX6520

## 2016-05-20 SURGERY — RADIOACTIVE SEED GUIDED PARTIAL MASTECTOMY WITH AXILLARY SENTINEL LYMPH NODE BIOPSY
Anesthesia: Regional | Site: Breast | Laterality: Right

## 2016-05-20 MED ORDER — FENTANYL CITRATE (PF) 100 MCG/2ML IJ SOLN
INTRAMUSCULAR | Status: AC
  Filled 2016-05-20: qty 2

## 2016-05-20 MED ORDER — PHENYLEPHRINE HCL 10 MG/ML IJ SOLN
INTRAMUSCULAR | Status: DC | PRN
  Administered 2016-05-20: 40 ug via INTRAVENOUS

## 2016-05-20 MED ORDER — BUPIVACAINE HCL (PF) 0.25 % IJ SOLN
INTRAMUSCULAR | Status: DC | PRN
  Administered 2016-05-20: 14 mL

## 2016-05-20 MED ORDER — OXYCODONE HCL 5 MG PO TABS
ORAL_TABLET | ORAL | Status: AC
  Filled 2016-05-20: qty 1

## 2016-05-20 MED ORDER — OXYCODONE HCL 5 MG PO TABS
5.0000 mg | ORAL_TABLET | Freq: Once | ORAL | Status: AC | PRN
  Administered 2016-05-20: 5 mg via ORAL

## 2016-05-20 MED ORDER — DEXAMETHASONE SODIUM PHOSPHATE 4 MG/ML IJ SOLN
INTRAMUSCULAR | Status: DC | PRN
  Administered 2016-05-20: 10 mg via INTRAVENOUS

## 2016-05-20 MED ORDER — VENLAFAXINE HCL ER 75 MG PO CP24: 75.0000 mg | ORAL_CAPSULE | Freq: Every day | ORAL | 12 refills | Status: DC

## 2016-05-20 MED ORDER — SODIUM CHLORIDE 0.9 % IJ SOLN
INTRAMUSCULAR | Status: AC
  Filled 2016-05-20: qty 10

## 2016-05-20 MED ORDER — MIDAZOLAM HCL 5 MG/5ML IJ SOLN
INTRAMUSCULAR | Status: DC | PRN
  Administered 2016-05-20: 2 mg via INTRAVENOUS

## 2016-05-20 MED ORDER — GLYCOPYRROLATE 0.2 MG/ML IJ SOLN: 0.2000 mg | Freq: Once | INTRAMUSCULAR | Status: DC | PRN

## 2016-05-20 MED ORDER — GABAPENTIN 300 MG PO CAPS
300.0000 mg | ORAL_CAPSULE | ORAL | Status: AC
  Administered 2016-05-20: 300 mg via ORAL

## 2016-05-20 MED ORDER — PHENYLEPHRINE 40 MCG/ML (10ML) SYRINGE FOR IV PUSH (FOR BLOOD PRESSURE SUPPORT)
PREFILLED_SYRINGE | INTRAVENOUS | Status: AC
  Filled 2016-05-20: qty 10

## 2016-05-20 MED ORDER — ACETAMINOPHEN 500 MG PO TABS
ORAL_TABLET | ORAL | Status: AC
  Filled 2016-05-20: qty 2

## 2016-05-20 MED ORDER — OXYMETAZOLINE HCL 0.05 % NA SOLN
NASAL | Status: AC
  Filled 2016-05-20: qty 15

## 2016-05-20 MED ORDER — METHYLENE BLUE 0.5 % INJ SOLN
INTRAVENOUS | Status: AC
  Filled 2016-05-20: qty 10

## 2016-05-20 MED ORDER — FENTANYL CITRATE (PF) 100 MCG/2ML IJ SOLN
25.0000 ug | INTRAMUSCULAR | Status: DC | PRN
  Administered 2016-05-20 (×2): 50 ug via INTRAVENOUS
  Administered 2016-05-20: 25 ug via INTRAVENOUS

## 2016-05-20 MED ORDER — MIDAZOLAM HCL 2 MG/2ML IJ SOLN
INTRAMUSCULAR | Status: AC
  Filled 2016-05-20: qty 2

## 2016-05-20 MED ORDER — PROPOFOL 10 MG/ML IV BOLUS
INTRAVENOUS | Status: AC
Start: 2016-05-20 — End: 2016-05-20
  Filled 2016-05-20: qty 20

## 2016-05-20 MED ORDER — BUPIVACAINE HCL (PF) 0.25 % IJ SOLN
INTRAMUSCULAR | Status: AC
  Filled 2016-05-20: qty 30

## 2016-05-20 MED ORDER — LIDOCAINE 2% (20 MG/ML) 5 ML SYRINGE
INTRAMUSCULAR | Status: AC
  Filled 2016-05-20: qty 5

## 2016-05-20 MED ORDER — LACTATED RINGERS IV SOLN: INTRAVENOUS | Status: DC

## 2016-05-20 MED ORDER — LACTATED RINGERS IV SOLN
INTRAVENOUS | Status: DC
  Administered 2016-05-20 (×2): via INTRAVENOUS

## 2016-05-20 MED ORDER — SCOPOLAMINE 1 MG/3DAYS TD PT72: 1.0000 | MEDICATED_PATCH | Freq: Once | TRANSDERMAL | Status: DC | PRN

## 2016-05-20 MED ORDER — BUPIVACAINE-EPINEPHRINE (PF) 0.5% -1:200000 IJ SOLN
INTRAMUSCULAR | Status: DC | PRN
  Administered 2016-05-20: 30 mL via PERINEURAL

## 2016-05-20 MED ORDER — ONDANSETRON HCL 4 MG/2ML IJ SOLN
INTRAMUSCULAR | Status: AC
  Filled 2016-05-20: qty 2

## 2016-05-20 MED ORDER — METOCLOPRAMIDE HCL 5 MG/ML IJ SOLN: 10.0000 mg | Freq: Once | INTRAMUSCULAR | Status: DC | PRN

## 2016-05-20 MED ORDER — PROPOFOL 10 MG/ML IV BOLUS
INTRAVENOUS | Status: AC
  Filled 2016-05-20: qty 20

## 2016-05-20 MED ORDER — ACETAMINOPHEN 500 MG PO TABS
1000.0000 mg | ORAL_TABLET | ORAL | Status: AC
  Administered 2016-05-20: 1000 mg via ORAL

## 2016-05-20 MED ORDER — PROPOFOL 10 MG/ML IV BOLUS
INTRAVENOUS | Status: DC | PRN
  Administered 2016-05-20: 150 mg via INTRAVENOUS

## 2016-05-20 MED ORDER — TECHNETIUM TC 99M SULFUR COLLOID FILTERED
1.0000 | Freq: Once | INTRAVENOUS | Status: AC | PRN
  Administered 2016-05-20: 1 via INTRADERMAL

## 2016-05-20 MED ORDER — MEPERIDINE HCL 25 MG/ML IJ SOLN: 6.2500 mg | INTRAMUSCULAR | Status: DC | PRN

## 2016-05-20 MED ORDER — FENTANYL CITRATE (PF) 100 MCG/2ML IJ SOLN
50.0000 ug | INTRAMUSCULAR | Status: DC | PRN
  Administered 2016-05-20: 100 ug via INTRAVENOUS

## 2016-05-20 MED ORDER — BACITRACIN ZINC 500 UNIT/GM EX OINT
TOPICAL_OINTMENT | CUTANEOUS | Status: AC
  Filled 2016-05-20: qty 0.9

## 2016-05-20 MED ORDER — GABAPENTIN 300 MG PO CAPS
ORAL_CAPSULE | ORAL | Status: AC
  Filled 2016-05-20: qty 1

## 2016-05-20 MED ORDER — CEFAZOLIN SODIUM-DEXTROSE 2-4 GM/100ML-% IV SOLN
2.0000 g | INTRAVENOUS | Status: AC
  Administered 2016-05-20: 2 g via INTRAVENOUS

## 2016-05-20 MED ORDER — OXYCODONE-ACETAMINOPHEN 10-325 MG PO TABS: 1.0000 | ORAL_TABLET | Freq: Four times a day (QID) | ORAL | 0 refills | Status: DC | PRN

## 2016-05-20 MED ORDER — LIDOCAINE-EPINEPHRINE 1 %-1:100000 IJ SOLN
INTRAMUSCULAR | Status: AC
  Filled 2016-05-20: qty 1

## 2016-05-20 MED ORDER — CIPROFLOXACIN-FLUOCINOLONE PF 0.3-0.025 % OT SOLN
OTIC | Status: AC
  Filled 2016-05-20: qty 0.25

## 2016-05-20 MED ORDER — CEFAZOLIN SODIUM-DEXTROSE 2-4 GM/100ML-% IV SOLN
INTRAVENOUS | Status: AC
  Filled 2016-05-20: qty 100

## 2016-05-20 MED ORDER — FENTANYL CITRATE (PF) 100 MCG/2ML IJ SOLN
INTRAMUSCULAR | Status: DC | PRN
  Administered 2016-05-20: 100 ug via INTRAVENOUS
  Administered 2016-05-20: 25 ug via INTRAVENOUS

## 2016-05-20 MED ORDER — DEXAMETHASONE SODIUM PHOSPHATE 10 MG/ML IJ SOLN
INTRAMUSCULAR | Status: AC
  Filled 2016-05-20: qty 1

## 2016-05-20 MED ORDER — MIDAZOLAM HCL 2 MG/2ML IJ SOLN
1.0000 mg | INTRAMUSCULAR | Status: DC | PRN
  Administered 2016-05-20: 1 mg via INTRAVENOUS

## 2016-05-20 SURGICAL SUPPLY — 68 items
ADH SKN CLS APL DERMABOND .7 (GAUZE/BANDAGES/DRESSINGS) ×2
APPLIER CLIP 9.375 MED OPEN (MISCELLANEOUS) ×3
APR CLP MED 9.3 20 MLT OPN (MISCELLANEOUS) ×2
BINDER BREAST LRG (GAUZE/BANDAGES/DRESSINGS) ×1 IMPLANT
BINDER BREAST MEDIUM (GAUZE/BANDAGES/DRESSINGS) IMPLANT
BINDER BREAST XLRG (GAUZE/BANDAGES/DRESSINGS) IMPLANT
BINDER BREAST XXLRG (GAUZE/BANDAGES/DRESSINGS) IMPLANT
BLADE SURG 15 STRL LF DISP TIS (BLADE) ×2 IMPLANT
BLADE SURG 15 STRL SS (BLADE) ×3
CANISTER SUC SOCK COL 7IN (MISCELLANEOUS) IMPLANT
CANISTER SUCT 1200ML W/VALVE (MISCELLANEOUS) ×1 IMPLANT
CHLORAPREP W/TINT 26ML (MISCELLANEOUS) ×3 IMPLANT
CLIP APPLIE 9.375 MED OPEN (MISCELLANEOUS) IMPLANT
CLIP TI WIDE RED SMALL 6 (CLIP) ×4 IMPLANT
COVER BACK TABLE 60X90IN (DRAPES) ×3 IMPLANT
COVER MAYO STAND STRL (DRAPES) ×3 IMPLANT
COVER PROBE W GEL 5X96 (DRAPES) ×3 IMPLANT
DECANTER SPIKE VIAL GLASS SM (MISCELLANEOUS) IMPLANT
DERMABOND ADVANCED (GAUZE/BANDAGES/DRESSINGS) ×1
DERMABOND ADVANCED .7 DNX12 (GAUZE/BANDAGES/DRESSINGS) ×2 IMPLANT
DEVICE DUBIN W/COMP PLATE 8390 (MISCELLANEOUS) ×4 IMPLANT
DRAPE LAPAROSCOPIC ABDOMINAL (DRAPES) ×3 IMPLANT
DRAPE UTILITY XL STRL (DRAPES) ×3 IMPLANT
DRSG TEGADERM 4X4.75 (GAUZE/BANDAGES/DRESSINGS) ×2 IMPLANT
ELECT COATED BLADE 2.86 ST (ELECTRODE) ×3 IMPLANT
ELECT REM PT RETURN 9FT ADLT (ELECTROSURGICAL) ×3
ELECTRODE REM PT RTRN 9FT ADLT (ELECTROSURGICAL) ×2 IMPLANT
GLOVE BIO SURGEON STRL SZ7 (GLOVE) ×7 IMPLANT
GLOVE BIOGEL PI IND STRL 7.0 (GLOVE) IMPLANT
GLOVE BIOGEL PI IND STRL 7.5 (GLOVE) ×2 IMPLANT
GLOVE BIOGEL PI INDICATOR 7.0 (GLOVE) ×2
GLOVE BIOGEL PI INDICATOR 7.5 (GLOVE) ×1
GLOVE ECLIPSE 6.5 STRL STRAW (GLOVE) ×1 IMPLANT
GOWN STRL REUS W/ TWL LRG LVL3 (GOWN DISPOSABLE) ×6 IMPLANT
GOWN STRL REUS W/TWL LRG LVL3 (GOWN DISPOSABLE) ×6
HEMOSTAT ARISTA ABSORB 3G PWDR (MISCELLANEOUS) ×2 IMPLANT
ILLUMINATOR WAVEGUIDE N/F (MISCELLANEOUS) ×1 IMPLANT
KIT MARKER MARGIN INK (KITS) ×3 IMPLANT
LIGHT WAVEGUIDE WIDE FLAT (MISCELLANEOUS) IMPLANT
NDL HYPO 25X1 1.5 SAFETY (NEEDLE) ×2 IMPLANT
NDL SAFETY ECLIPSE 18X1.5 (NEEDLE) IMPLANT
NEEDLE HYPO 18GX1.5 SHARP (NEEDLE)
NEEDLE HYPO 25X1 1.5 SAFETY (NEEDLE) ×3 IMPLANT
NS IRRIG 1000ML POUR BTL (IV SOLUTION) ×1 IMPLANT
PACK BASIN DAY SURGERY FS (CUSTOM PROCEDURE TRAY) ×3 IMPLANT
PENCIL BUTTON HOLSTER BLD 10FT (ELECTRODE) ×3 IMPLANT
SHEET MEDIUM DRAPE 40X70 STRL (DRAPES) ×1 IMPLANT
SLEEVE SCD COMPRESS KNEE MED (MISCELLANEOUS) ×3 IMPLANT
SPONGE GAUZE 4X4 12PLY STER LF (GAUZE/BANDAGES/DRESSINGS) ×2 IMPLANT
SPONGE LAP 4X18 X RAY DECT (DISPOSABLE) ×5 IMPLANT
STOCKINETTE IMPERVIOUS LG (DRAPES) IMPLANT
STRIP CLOSURE SKIN 1/2X4 (GAUZE/BANDAGES/DRESSINGS) ×3 IMPLANT
SUT ETHILON 2 0 FS 18 (SUTURE) IMPLANT
SUT MNCRL AB 3-0 PS2 18 (SUTURE) IMPLANT
SUT MNCRL AB 4-0 PS2 18 (SUTURE) ×4 IMPLANT
SUT MON AB 5-0 PS2 18 (SUTURE) ×3 IMPLANT
SUT SILK 2 0 SH (SUTURE) ×4 IMPLANT
SUT VIC AB 2-0 SH 27 (SUTURE) ×9
SUT VIC AB 2-0 SH 27XBRD (SUTURE) ×2 IMPLANT
SUT VIC AB 3-0 SH 27 (SUTURE) ×6
SUT VIC AB 3-0 SH 27X BRD (SUTURE) ×2 IMPLANT
SUT VIC AB 5-0 PS2 18 (SUTURE) IMPLANT
SUT VICRYL AB 3 0 TIES (SUTURE) IMPLANT
SYR CONTROL 10ML LL (SYRINGE) ×3 IMPLANT
TOWEL OR 17X24 6PK STRL BLUE (TOWEL DISPOSABLE) ×3 IMPLANT
TOWEL OR NON WOVEN STRL DISP B (DISPOSABLE) ×2 IMPLANT
TUBE CONNECTING 20X1/4 (TUBING) ×1 IMPLANT
YANKAUER SUCT BULB TIP NO VENT (SUCTIONS) ×1 IMPLANT

## 2016-05-20 NOTE — Progress Notes (Signed)
Assisted Dr. Carignan with left, ultrasound guided, pectoralis block. Side rails up, monitors on throughout procedure. See vital signs in flow sheet. Tolerated Procedure well. 

## 2016-05-20 NOTE — Progress Notes (Signed)
Nuc med injection performed by radiology staff. Pt tol well without additional sedation. VSS (see flowsheet). Will call family to bedside and update/provide emotional support

## 2016-05-20 NOTE — Discharge Instructions (Signed)
[facility] °Office Phone Number [phone] ° °POST OP INSTRUCTIONS ° °Always review your discharge instruction sheet given to you by the facility where your surgery was performed. ° °IF YOU HAVE DISABILITY OR FAMILY LEAVE FORMS, YOU MUST BRING THEM TO THE OFFICE FOR PROCESSING.  DO NOT GIVE THEM TO YOUR DOCTOR. ° °1. A prescription for pain medication may be given to you upon discharge.  Take your pain medication as prescribed, if needed.  If narcotic pain medicine is not needed, then you may take acetaminophen (Tylenol), naprosyn (Alleve) or ibuprofen (Advil) as needed. °2. Take your usually prescribed medications unless otherwise directed °3. If you need a refill on your pain medication, please contact your pharmacy.  They will contact our office to request authorization.  Prescriptions will not be filled after 5pm or on week-ends. °4. You should eat very light the first 24 hours after surgery, such as soup, crackers, pudding, etc.  Resume your normal diet the day after surgery. °5. Most patients will experience some swelling and bruising in the breast.  Ice packs and a good support bra will help.  Wear the breast binder provided or a sports bra for 72 hours day and night.  After that wear a sports bra during the day until you return to the office. Swelling and bruising can take several days to resolve.  °6. It is common to experience some constipation if taking pain medication after surgery.  Increasing fluid intake and taking a stool softener will usually help or prevent this problem from occurring.  A mild laxative (Milk of Magnesia or Miralax) should be taken according to package directions if there are no bowel movements after 48 hours. °7. Unless discharge instructions indicate otherwise, you may remove your bandages 48 hours after surgery and you may shower at that time.  You may have steri-strips (small skin tapes) in place directly over the incision.  These strips should be left on the  skin for 7-10 days and will come off on their own.  If your surgeon used skin glue on the incision, you may shower in 24 hours.  The glue will flake off over the next 2-3 weeks.  Any sutures or staples will be removed at the office during your follow-up visit. °8. ACTIVITIES:  You may resume regular daily activities (gradually increasing) beginning the next day.  Wearing a good support bra or sports bra minimizes pain and swelling.  You may have sexual intercourse when it is comfortable. °a. You may drive when you no longer are taking prescription pain medication, you can comfortably wear a seatbelt, and you can safely maneuver your car and apply brakes. °b. RETURN TO WORK:  ______________________________________________________________________________________ °9. You should see your doctor in the office for a follow-up appointment approximately two weeks after your surgery.  Your doctor’s nurse will typically make your follow-up appointment when she calls you with your pathology report.  Expect your pathology report 3-4 business days after your surgery.  You may call to check if you do not hear from us after three days. °10. OTHER INSTRUCTIONS: _______________________________________________________________________________________________ _____________________________________________________________________________________________________________________________________ °_____________________________________________________________________________________________________________________________________ °_____________________________________________________________________________________________________________________________________ ° °WHEN TO CALL DR WAKEFIELD: °1. Fever over 101.0 °2. Nausea and/or vomiting. °3. Extreme swelling or bruising. °4. Continued bleeding from incision. °5. Increased pain, redness, or drainage from the incision. ° °The clinic staff is available to answer your questions during regular  business hours.  Please don’t hesitate to call and ask to speak to one of the nurses for clinical concerns.  If   you have a medical emergency, go to the nearest emergency room or call 911.  A surgeon from [facility] is always on call at the hospital. ° °For further questions, please visit [email or web address] ° ° ° °Post Anesthesia Home Care Instructions ° °Activity: °Get plenty of rest for the remainder of the day. A responsible adult should stay with you for 24 hours following the procedure.  °For the next 24 hours, DO NOT: °-Drive a car °-Operate machinery °-Drink alcoholic beverages °-Take any medication unless instructed by your physician °-Make any legal decisions or sign important papers. ° °Meals: °Start with liquid foods such as gelatin or soup. Progress to regular foods as tolerated. Avoid greasy, spicy, heavy foods. If nausea and/or vomiting occur, drink only clear liquids until the nausea and/or vomiting subsides. Call your physician if vomiting continues. ° °Special Instructions/Symptoms: °Your throat may feel dry or sore from the anesthesia or the breathing tube placed in your throat during surgery. If this causes discomfort, gargle with warm salt water. The discomfort should disappear within 24 hours. ° °If you had a scopolamine patch placed behind your ear for the management of post- operative nausea and/or vomiting: ° °1. The medication in the patch is effective for 72 hours, after which it should be removed.  Wrap patch in a tissue and discard in the trash. Wash hands thoroughly with soap and water. °2. You may remove the patch earlier than 72 hours if you experience unpleasant side effects which may include dry mouth, dizziness or visual disturbances. °3. Avoid touching the patch. Wash your hands with soap and water after contact with the patch. °  ° °

## 2016-05-20 NOTE — Anesthesia Procedure Notes (Signed)
Procedure Name: LMA Insertion Date/Time: 05/20/2016 8:32 AM Performed by: Toula Moos L Pre-anesthesia Checklist: Patient identified, Emergency Drugs available, Suction available, Patient being monitored and Timeout performed Patient Re-evaluated:Patient Re-evaluated prior to inductionOxygen Delivery Method: Circle system utilized Preoxygenation: Pre-oxygenation with 100% oxygen Intubation Type: IV induction Ventilation: Mask ventilation without difficulty LMA: LMA inserted LMA Size: 4.0 Number of attempts: 1 Airway Equipment and Method: Bite block Placement Confirmation: positive ETCO2 Tube secured with: Tape Dental Injury: Teeth and Oropharynx as per pre-operative assessment

## 2016-05-20 NOTE — Op Note (Signed)
Preoperative diagnosis: leftbreast cancer clinical stage II, right breast cancer with positive margin s/p lumpectomy Postoperative diagnosis: same as above Procedure: 1. Right breast re-excision lumpectomy of medial margin 2. Left breast lumpectomy seed guided times two 3. Left targeted axillary dissection Surgeon Dr Serita Grammes Anes general with left pectoral block EBL: minimal Comps none Specimen:  1.right breast medial margin marked short superior, long lateral and double deep 2. Left targeted axillary node dissection (this was targeted node and sentinel nodes as well) 3. Left inferior lumpectomy marked with paint 4. Left superior lumpectomy marked with paint 5. Additional left lateral and superior margins from superior lumpectomy on superior margin alone (the inferior margin of superior lumpectomy is the other lumpectomy specimen), marked short superior, long lateral double deep Sponge count correct at completion dispo to recovery stable  Indications: This is a 69 yof who initially had a clinical stage I right breast cancer.  She underwent right lumpectomy and sn biopsy. The initial cancer was excised with a positive node. I felt a palpable area and there was a medial ILC as well. This margin is positive. We discussed this margin removal after negative mri on this.  She was also noted on mri to have an additional left breast cancer and another left breast cancer seen only on u/s.  She also had a positive node on the left.  We discussed all options and decided to proceed with double lumpectomy and TAD.   Procedure: After informed consent was obtained the patient was taken to the OR. She was injected with technetium in the standard periareolar fashion on the left side. She underwent a pectoral block on the left side. She was given anitibiotics. SCDs were in place. She was prepped and draped in the standard sterile surgical fashion. A timeout was performed. She had three seeds placed  prior to beginning and I had these mm available for review.  I first reentered the old incision in the right axilla. I reopened the old lumpectomy cavity. I identified the medial margin and excised this. This was marked as above. I placed a new clip at the medial margin.  I then obtained hemostasis.  I then closed the breast tissue with 2-0 vicryl. I closed the skin with 3-0 vicryl and 4-0 monocryl.  Glue and steristrips were placed.  I then turned my attention to the left side. I first did the nodes.  I identified the location of the seed. I made an incision below the hairline.  I went through the axillary fascia. I identified a bundle of nodes that included the seed.  The sentinel nodes were also in this bundle.  This was passed off the table. Mammogram confirmed that the seed was in the specimen and then had the clip as well. There was no more radioactivity in the axilla.  I obtained hemostasis.   I then closed the axillary fascia with 2-0 vicryl.  The skin was closed 3-0 vicryl and 4-0 monocryl.  Glue and steristrips were applied.  I then located both the seeds in the lateral left breast.  I made a curvilinear incision in the lateral breast to remove both through the same incision. I first removed the inferior lumpectomy with an attempt to get a clear margin.  The deep margin is the muscle.  The seed and clip were confirmed to be in this specimen.  This was marked with paint.  I then located the other side and this was near the superior margin of my first lumpectomy.  I elected to remove this all as one lumpectomy although they are separate. The superior margin of the inferior lumpectomy is the inferior margin of the superior margin.  I then removed the superior seed.  The deep margin is the muscle again.  I had pathology look at these grossly and it looked like the lateral margin was close. I decided to remove additional lateral and superior margins and mark these with short stitch superior, long lateral  and double deep.  I then obtained hemostasis. I placed clips in the cavity..I closed this with 2-0 vicryl, 3-0 vicryl and 4-0 monocryl. Glue and steristrips were placed A breast binder was placed. She was extubated and transferred to recovery stable

## 2016-05-20 NOTE — H&P (Signed)
Sabrina Tapia is an 65 y.o. female.   Chief Complaint: bilateral breast cancer HPI: 22 yof who has undergone a right breast seed guided lumpectomy and right axillary sn biopsy recently. she was noted to have small right breast cancer initially and then at surgery was noted to have a 1.4 cm invasive ductal carcinoma with associated dcis and these margins were clear. I palpated an abnormal area at the medial margin and removed this. This was a 6 mm ILC that focally involved the final medial margin and was not detected preop. one sentinel node shows metastatic disease. The IDC is er/pr positive and her 2 negative with low Ki. the Hancock is 100% er/pr positive, Ki is 3# and her 2 negative. due to the ilc and concern I ordered an mri postop. she has no postop issues. the mri shows a right breast seroma and then two enhancing masses posterior to the lumpectomy cavity measuring 1 and 53mm in size. these appear to be nodes and could represent reactive postop nodes. the left breast has a 1.3 cm irregular enhancing mass. there are no other abnl axillary or mammary nodes. I discussed case with Dr Marcelo Baldy and he biopsied one of small posterior to lumpectomy cavity masses on right and this is benign node. I think these are just reactive. On the left he could not identify the mr finding but did not another left breast mass (this is not one on mri). this has undergone biopsy and is IDC. she now has also had the mri found mass biopsied and is idc. mammaprint negative on left side.   Past Medical History:  Diagnosis Date  . Anxiety   . Arthritis   . Asthma    allergy induced  . Cancer ([place]) 2017   bil breast cancer  . Cervical polyp   . Detrusor instability   . GERD (gastroesophageal reflux disease)   . Heart burn   . Hemorrhoids   . Hypertonicity of bladder   . Joint pain   . LGSIL (low grade squamous intraepithelial dysplasia) 2001   colpo biopsy negative, negative paps since  . Osteopenia  02/2016   T score -1.3 FRAX 8.3%/0.7% stable from prior DEXA    Past Surgical History:  Procedure Laterality Date  . RADIOACTIVE SEED GUIDED MASTECTOMY WITH AXILLARY SENTINEL LYMPH NODE BIOPSY Right 03/26/2016   Procedure: RIGHT BREAST LUMPECTOMY WITH RADIOACTIVE SEED AND SENTINEL LYMPH NODE BIOPSY;  Surgeon: Rolm Bookbinder, MD;  Location: [facility];  Service: General;  Laterality: Right;    Family History  Problem Relation Age of Onset  . Hypertension Mother   . Heart disease Mother   . Lung disease Mother   . Hypertension Father   . Cancer Father     colon  . Heart disease Father   . Lung cancer Father   . Breast cancer Paternal Grandmother     Age 10's  . Hypertension Brother   . Hyperlipidemia Brother   . Diabetes Maternal Grandfather    Social History:  reports that she quit smoking about 31 years ago. She has never used smokeless tobacco. She reports that she drinks about 7.2 oz of alcohol per week . She reports that she does not use drugs.  Allergies: No Known Allergies  Medications Prior to Admission  Medication Sig Dispense Refill  . acetaminophen (TYLENOL) 500 MG tablet Take 1,000 mg by mouth every 6 (six) hours as needed.    Marland Kitchen albuterol (PROAIR HFA) 108 (90 Base) MCG/ACT inhaler  Inhale 2 puffs into the lungs every 4 (four) hours as needed for wheezing or shortness of breath. 1 Inhaler 1  . ALPRAZolam (XANAX) 0.25 MG tablet Take 1 tablet (0.25 mg total) by mouth at bedtime as needed. for sleep 30 tablet 1  . Calcium Carbonate-Vitamin D (CALCIUM + D PO) Take by mouth.    . cetirizine (ZYRTEC) 10 MG tablet Take 10 mg by mouth.    . docusate sodium (COLACE) 50 MG capsule Take 50 mg by mouth.    . fluticasone (FLONASE) 50 MCG/ACT nasal spray Place 2 sprays into both nostrils daily. 16 g 5  . hydrocortisone (ANUSOL-HC) 2.5 % rectal cream Place 1 application rectally 4 (four) times daily. 30 g 0  . hydrocortisone (ANUSOL-HC) 25 MG suppository UNWRAP AND  INSERT 1 SUPPOSITORY RECTALLY 2 TIMES DAILY.    Marland Kitchen LUTEIN PO Take by mouth.    . Multiple Vitamin (MULTIVITAMIN) tablet Take 1 tablet by mouth daily.    . Omega-3 Fatty Acids (FISH OIL PO) Take by mouth.    . polyethylene glycol (MIRALAX / GLYCOLAX) packet Take 17 g by mouth daily.    Marland Kitchen PRILOSEC OTC 20 MG tablet Reported on 12/14/2015    . Probiotic Product (PROBIOTIC DAILY PO) Take by mouth.    Marland Kitchen QVAR 40 MCG/ACT inhaler INHALE 2 PUFFS BY MOUTH TWICE DAILY TO PREVENT COUGH OR WHEEZING. RINSE, GARGLE AND SPIT AFTER USE 8.7 g 4  . solifenacin (VESICARE) 5 MG tablet Take 1 tablet (5 mg total) by mouth daily. 30 tablet 12  . Turmeric 450 MG CAPS Take by mouth.    . venlafaxine XR (EFFEXOR XR) 37.5 MG 24 hr capsule Take 1 capsule (37.5 mg total) by mouth daily with breakfast. (Patient taking differently: Take 75 mg by mouth daily with breakfast. ) 30 capsule 6  . lidocaine (XYLOCAINE) 5 % ointment Apply 1 application topically as needed. 35.44 g 0    No results found for this or any previous visit (from the past 48 hour(s)). Nm Sentinel Node Inj-no Rpt (breast)  Result Date: 05/20/2016 CLINICAL DATA: left axillary sn biopsy Sulfur colloid was injected intradermally by the nuclear medicine technologist for breast cancer sentinel node localization.    ROS Negative  Blood pressure 110/69, pulse 77, temperature 98.1 F (36.7 C), temperature source Oral, resp. rate 13, height 5\' 4"  (1.626 m), weight 64.9 kg (143 lb), SpO2 100 %. Physical Exam  cv rrr pulm clear bilaterally Right breast incision healing well No adenopathy Assessment/Plan  Assessment & Plan  BREAST CANCER OF UPPER-OUTER QUADRANT OF LEFT FEMALE BREAST (C50.412) Story: again would like to do bct. we discussed mastectomy vs double lumpectomy on this side. understands mastectomy has been historical treatment for this but I think double lumpectomy reasonable with seed guidance. certainly a concern about how to follow her given the  findings we have had though. I also discussed alnd vs tad on this side and she would like to proceed with double lumpectomy and tad.  BREAST CANCER OF UPPER-OUTER QUADRANT OF RIGHT FEMALE BREAST (C50.411) Story: She very much would like to continue with breast conservation and not have alnd. I still think reasonable to excise medial margin on the right and do radiotherapy for whole breast and axilla.  Rolm Bookbinder, MD 05/20/2016, 8:15 AM

## 2016-05-20 NOTE — Interval H&P Note (Signed)
History and Physical Interval Note:  05/20/2016 8:16 AM  Sabrina Tapia  has presented today for surgery, with the diagnosis of BILATERAL BREAST CANCER  The various methods of treatment have been discussed with the patient and family. After consideration of risks, benefits and other options for treatment, the patient has consented to  Procedure(s): LEFT BREAST RADIOACTIVE SEED X'S 2 GUIDED LUMPECTOMY WITH LEFT AXILLARY SENTINEL LYMPH NODE BIOPSY AND LEFT SEED TARGETED AXILLARY LYMPH NODE EXCISION (Left) RE-EXCISION OF RIGHT BREAST MARGIN (Right) as a surgical intervention .  The patient's history has been reviewed, patient examined, no change in status, stable for surgery.  I have reviewed the patient's chart and labs.  Questions were answered to the patient's satisfaction.     Sabrina Tapia

## 2016-05-20 NOTE — Anesthesia Postprocedure Evaluation (Signed)
Anesthesia Post Note  Patient: Sabrina Tapia  Procedure(s) Performed: Procedure(s) (LRB): LEFT BREAST RADIOACTIVE SEED (two seeds) GUIDED LUMPECTOMY WITH LEFT AXILLARY SENTINEL LYMPH NODE BIOPSY AND LEFT SEED TARGETED AXILLARY LYMPH NODE EXCISION (Left) RE-EXCISION OF RIGHT BREAST MARGIN (Right)  Patient location during evaluation: PACU Anesthesia Type: General Level of consciousness: awake and alert Pain management: pain level controlled Vital Signs Assessment: post-procedure vital signs reviewed and stable Respiratory status: spontaneous breathing, nonlabored ventilation, respiratory function stable and patient connected to nasal cannula oxygen Cardiovascular status: blood pressure returned to baseline and stable Postop Assessment: no signs of nausea or vomiting Anesthetic complications: no    Last Vitals:  Vitals:   05/20/16 1115 05/20/16 1130  BP: 131/73 (!) 166/71  Pulse: 72 75  Resp: 10 13  Temp:      Last Pain:  Vitals:   05/20/16 1130  TempSrc:   PainSc: 4                  Montez Hageman

## 2016-05-20 NOTE — Anesthesia Procedure Notes (Signed)
Anesthesia Regional Block:  Pectoralis block  Pre-Anesthetic Checklist: ,, timeout performed, Correct Patient, Correct Site, Correct Laterality, Correct Procedure, Correct Position, site marked, Risks and benefits discussed,  Surgical consent,  Pre-op evaluation,  At surgeon's request and post-op pain management  Laterality: Left  Prep: Maximum Sterile Barrier Precautions used, chloraprep       Needles:  Injection technique: Single-shot  Needle Type: Echogenic Stimulator Needle     Needle Length: 10cm 10 cm Needle Gauge: 21 G    Additional Needles:  Procedures: ultrasound guided (picture in chart) Pectoralis block Narrative:  Injection made incrementally with aspirations every 5 mL.  Performed by: Personally   Additional Notes: Risks, benefits and alternative to block explained extensively.  Patient tolerated procedure well, without complications.

## 2016-05-20 NOTE — Anesthesia Preprocedure Evaluation (Signed)
Anesthesia Evaluation  Patient identified by MRN, date of birth, ID band Patient awake    Reviewed: Allergy & Precautions, NPO status , Patient's Chart, lab work & pertinent test results  Airway Mallampati: II  TM Distance: >3 FB     Dental   Pulmonary former smoker,    Pulmonary exam normal        Cardiovascular negative cardio ROS Normal cardiovascular exam     Neuro/Psych negative neurological ROS     GI/Hepatic Neg liver ROS, GERD  ,  Endo/Other  negative endocrine ROS  Renal/GU negative Renal ROS     Musculoskeletal  (+) Arthritis ,   Abdominal   Peds  Hematology negative hematology ROS (+)   Anesthesia Other Findings   Reproductive/Obstetrics                             Anesthesia Physical  Anesthesia Plan  ASA: II  Anesthesia Plan: General and Regional   Post-op Pain Management:  Regional for Post-op pain   Induction: Intravenous  Airway Management Planned: LMA  Additional Equipment:   Intra-op Plan:   Post-operative Plan: Extubation in OR  Informed Consent: I have reviewed the patients History and Physical, chart, labs and discussed the procedure including the risks, benefits and alternatives for the proposed anesthesia with the patient or authorized representative who has indicated his/her understanding and acceptance.   Dental advisory given  Plan Discussed with: CRNA  Anesthesia Plan Comments:         Anesthesia Quick Evaluation

## 2016-05-20 NOTE — Anesthesia Procedure Notes (Deleted)
Performed by: Danton Palmateer L       

## 2016-05-20 NOTE — Transfer of Care (Signed)
Immediate Anesthesia Transfer of Care Note  Patient: Sabrina Tapia  Procedure(s) Performed: Procedure(s): LEFT BREAST RADIOACTIVE SEED (two seeds) GUIDED LUMPECTOMY WITH LEFT AXILLARY SENTINEL LYMPH NODE BIOPSY AND LEFT SEED TARGETED AXILLARY LYMPH NODE EXCISION (Left) RE-EXCISION OF RIGHT BREAST MARGIN (Right)  Patient Location: PACU  Anesthesia Type:GA combined with regional for post-op pain  Level of Consciousness: awake and patient cooperative  Airway & Oxygen Therapy: Patient Spontanous Breathing and Patient connected to face mask oxygen  Post-op Assessment: Report given to RN and Post -op Vital signs reviewed and stable  Post vital signs: Reviewed and stable  Last Vitals:  Vitals:   05/20/16 0750 05/20/16 0755  BP: 117/67 110/69  Pulse: 80 77  Resp: 20 13  Temp:      Last Pain:  Vitals:   05/20/16 0647  TempSrc: Oral         Complications: No apparent anesthesia complications

## 2016-05-21 ENCOUNTER — Encounter (HOSPITAL_BASED_OUTPATIENT_CLINIC_OR_DEPARTMENT_OTHER): Payer: Self-pay | Admitting: General Surgery

## 2016-05-23 ENCOUNTER — Other Ambulatory Visit: Payer: Self-pay | Admitting: General Surgery

## 2016-05-27 ENCOUNTER — Encounter (HOSPITAL_BASED_OUTPATIENT_CLINIC_OR_DEPARTMENT_OTHER): Payer: Self-pay | Admitting: *Deleted

## 2016-05-28 NOTE — Pre-Procedure Instructions (Signed)
Patient given boost with instruction to drink by 0900, voiced understanding. Also c/o pain and swelling in  left axilla from lymph node biopsy. Area is firm and swollen, tender to touch, no drainage. Told pt to call Dr Cristal Generous for check.

## 2016-06-02 ENCOUNTER — Ambulatory Visit (HOSPITAL_BASED_OUTPATIENT_CLINIC_OR_DEPARTMENT_OTHER): Payer: BC Managed Care – PPO | Admitting: Anesthesiology

## 2016-06-02 ENCOUNTER — Ambulatory Visit (HOSPITAL_BASED_OUTPATIENT_CLINIC_OR_DEPARTMENT_OTHER)
Admission: RE | Admit: 2016-06-02 | Discharge: 2016-06-02 | Disposition: A | Discharge status: Home / Self Care | Payer: BC Managed Care – PPO | Source: Ambulatory Visit | Attending: General Surgery | Admitting: General Surgery

## 2016-06-02 ENCOUNTER — Encounter (HOSPITAL_BASED_OUTPATIENT_CLINIC_OR_DEPARTMENT_OTHER)
Admission: RE | Disposition: A | Discharge status: Home / Self Care | Payer: Self-pay | Source: Ambulatory Visit | Attending: General Surgery

## 2016-06-02 ENCOUNTER — Encounter (HOSPITAL_BASED_OUTPATIENT_CLINIC_OR_DEPARTMENT_OTHER): Payer: Self-pay | Admitting: Anesthesiology

## 2016-06-02 ENCOUNTER — Other Ambulatory Visit: Payer: Self-pay | Admitting: *Deleted

## 2016-06-02 DIAGNOSIS — C50412 Malignant neoplasm of upper-outer quadrant of left female breast: Secondary | ICD-10-CM | POA: Diagnosis not present

## 2016-06-02 DIAGNOSIS — K219 Gastro-esophageal reflux disease without esophagitis: Secondary | ICD-10-CM | POA: Diagnosis not present

## 2016-06-02 DIAGNOSIS — Z87891 Personal history of nicotine dependence: Secondary | ICD-10-CM | POA: Insufficient documentation

## 2016-06-02 DIAGNOSIS — M1990 Unspecified osteoarthritis, unspecified site: Secondary | ICD-10-CM | POA: Diagnosis not present

## 2016-06-02 DIAGNOSIS — Z79899 Other long term (current) drug therapy: Secondary | ICD-10-CM | POA: Diagnosis not present

## 2016-06-02 DIAGNOSIS — J45909 Unspecified asthma, uncomplicated: Secondary | ICD-10-CM | POA: Insufficient documentation

## 2016-06-02 DIAGNOSIS — C50411 Malignant neoplasm of upper-outer quadrant of right female breast: Secondary | ICD-10-CM | POA: Diagnosis not present

## 2016-06-02 DIAGNOSIS — Z170 Estrogen receptor positive status [ER+]: Secondary | ICD-10-CM | POA: Diagnosis not present

## 2016-06-02 HISTORY — PX: INCISION AND DRAINAGE: SHX5863

## 2016-06-02 HISTORY — PX: RE-EXCISION OF BREAST CANCER,SUPERIOR MARGINS: SHX6047

## 2016-06-02 SURGERY — RE-EXCISION OF BREAST CANCER,SUPERIOR MARGINS
Anesthesia: General | Site: Breast | Laterality: Left

## 2016-06-02 MED ORDER — HYDROMORPHONE HCL 1 MG/ML IJ SOLN
0.2500 mg | INTRAMUSCULAR | Status: DC | PRN
  Administered 2016-06-02 (×3): 0.5 mg via INTRAVENOUS

## 2016-06-02 MED ORDER — MIDAZOLAM HCL 2 MG/2ML IJ SOLN: 1.0000 mg | INTRAMUSCULAR | Status: DC | PRN

## 2016-06-02 MED ORDER — OXYCODONE HCL 5 MG PO TABS: 5.0000 mg | ORAL_TABLET | Freq: Once | ORAL | Status: DC | PRN

## 2016-06-02 MED ORDER — MIDAZOLAM HCL 5 MG/5ML IJ SOLN
INTRAMUSCULAR | Status: DC | PRN
  Administered 2016-06-02: 2 mg via INTRAVENOUS

## 2016-06-02 MED ORDER — PROMETHAZINE HCL 25 MG/ML IJ SOLN: 6.2500 mg | INTRAMUSCULAR | Status: DC | PRN

## 2016-06-02 MED ORDER — GABAPENTIN 300 MG PO CAPS
300.0000 mg | ORAL_CAPSULE | ORAL | Status: AC
  Administered 2016-06-02: 300 mg via ORAL

## 2016-06-02 MED ORDER — GABAPENTIN 300 MG PO CAPS
ORAL_CAPSULE | ORAL | Status: AC
  Filled 2016-06-02: qty 1

## 2016-06-02 MED ORDER — DEXAMETHASONE SODIUM PHOSPHATE 10 MG/ML IJ SOLN
INTRAMUSCULAR | Status: AC
  Filled 2016-06-02: qty 1

## 2016-06-02 MED ORDER — PROPOFOL 10 MG/ML IV BOLUS
INTRAVENOUS | Status: DC | PRN
  Administered 2016-06-02: 150 mg via INTRAVENOUS

## 2016-06-02 MED ORDER — ACETAMINOPHEN 500 MG PO TABS
ORAL_TABLET | ORAL | Status: AC
  Filled 2016-06-02: qty 2

## 2016-06-02 MED ORDER — LIDOCAINE 2% (20 MG/ML) 5 ML SYRINGE
INTRAMUSCULAR | Status: AC
  Filled 2016-06-02: qty 5

## 2016-06-02 MED ORDER — FENTANYL CITRATE (PF) 100 MCG/2ML IJ SOLN
INTRAMUSCULAR | Status: DC | PRN
  Administered 2016-06-02: 100 ug via INTRAVENOUS

## 2016-06-02 MED ORDER — CEFAZOLIN SODIUM-DEXTROSE 2-4 GM/100ML-% IV SOLN
INTRAVENOUS | Status: AC
  Filled 2016-06-02: qty 100

## 2016-06-02 MED ORDER — OXYCODONE HCL 5 MG/5ML PO SOLN: 5.0000 mg | Freq: Once | ORAL | Status: DC | PRN

## 2016-06-02 MED ORDER — LACTATED RINGERS IV SOLN: INTRAVENOUS | Status: DC

## 2016-06-02 MED ORDER — CEFAZOLIN SODIUM-DEXTROSE 2-4 GM/100ML-% IV SOLN
2.0000 g | INTRAVENOUS | Status: AC
  Administered 2016-06-02: 2 g via INTRAVENOUS

## 2016-06-02 MED ORDER — PHENYLEPHRINE HCL 10 MG/ML IJ SOLN
INTRAMUSCULAR | Status: DC | PRN
  Administered 2016-06-02 (×4): 80 ug via INTRAVENOUS

## 2016-06-02 MED ORDER — EPHEDRINE 5 MG/ML INJ
INTRAVENOUS | Status: AC
  Filled 2016-06-02: qty 10

## 2016-06-02 MED ORDER — BECLOMETHASONE DIPROPIONATE 40 MCG/ACT IN AERS: INHALATION_SPRAY | RESPIRATORY_TRACT | 4 refills | Status: DC

## 2016-06-02 MED ORDER — MIDAZOLAM HCL 2 MG/2ML IJ SOLN
INTRAMUSCULAR | Status: AC
  Filled 2016-06-02: qty 2

## 2016-06-02 MED ORDER — EPHEDRINE SULFATE 50 MG/ML IJ SOLN
INTRAMUSCULAR | Status: DC | PRN
  Administered 2016-06-02 (×3): 10 mg via INTRAVENOUS

## 2016-06-02 MED ORDER — DEXAMETHASONE SODIUM PHOSPHATE 4 MG/ML IJ SOLN
INTRAMUSCULAR | Status: DC | PRN
  Administered 2016-06-02: 10 mg via INTRAVENOUS

## 2016-06-02 MED ORDER — PROPOFOL 10 MG/ML IV BOLUS
INTRAVENOUS | Status: AC
Start: 2016-06-02 — End: 2016-06-02
  Filled 2016-06-02: qty 20

## 2016-06-02 MED ORDER — LACTATED RINGERS IV SOLN
INTRAVENOUS | Status: DC
  Administered 2016-06-02: 10:00:00 via INTRAVENOUS

## 2016-06-02 MED ORDER — MEPERIDINE HCL 25 MG/ML IJ SOLN: 6.2500 mg | INTRAMUSCULAR | Status: DC | PRN

## 2016-06-02 MED ORDER — ONDANSETRON HCL 4 MG/2ML IJ SOLN
INTRAMUSCULAR | Status: DC | PRN
  Administered 2016-06-02: 4 mg via INTRAVENOUS

## 2016-06-02 MED ORDER — ACETAMINOPHEN 500 MG PO TABS
1000.0000 mg | ORAL_TABLET | ORAL | Status: AC
  Administered 2016-06-02: 1000 mg via ORAL

## 2016-06-02 MED ORDER — LIDOCAINE HCL (CARDIAC) 20 MG/ML IV SOLN
INTRAVENOUS | Status: DC | PRN
  Administered 2016-06-02: 30 mg via INTRAVENOUS

## 2016-06-02 MED ORDER — FENTANYL CITRATE (PF) 100 MCG/2ML IJ SOLN
INTRAMUSCULAR | Status: AC
  Filled 2016-06-02: qty 2

## 2016-06-02 MED ORDER — ONDANSETRON HCL 4 MG/2ML IJ SOLN
INTRAMUSCULAR | Status: AC
  Filled 2016-06-02: qty 2

## 2016-06-02 MED ORDER — HYDROMORPHONE HCL 1 MG/ML IJ SOLN
INTRAMUSCULAR | Status: AC
  Filled 2016-06-02: qty 1

## 2016-06-02 MED ORDER — FENTANYL CITRATE (PF) 100 MCG/2ML IJ SOLN: 50.0000 ug | INTRAMUSCULAR | Status: DC | PRN

## 2016-06-02 MED ORDER — BUPIVACAINE HCL (PF) 0.25 % IJ SOLN
INTRAMUSCULAR | Status: DC | PRN
  Administered 2016-06-02: 10 mL

## 2016-06-02 MED ORDER — SCOPOLAMINE 1 MG/3DAYS TD PT72: 1.0000 | MEDICATED_PATCH | Freq: Once | TRANSDERMAL | Status: DC | PRN

## 2016-06-02 MED ORDER — PHENYLEPHRINE 40 MCG/ML (10ML) SYRINGE FOR IV PUSH (FOR BLOOD PRESSURE SUPPORT)
PREFILLED_SYRINGE | INTRAVENOUS | Status: AC
  Filled 2016-06-02: qty 10

## 2016-06-02 SURGICAL SUPPLY — 53 items
ADH SKN CLS APL DERMABOND .7 (GAUZE/BANDAGES/DRESSINGS)
BINDER BREAST LRG (GAUZE/BANDAGES/DRESSINGS) ×1 IMPLANT
BINDER BREAST MEDIUM (GAUZE/BANDAGES/DRESSINGS) IMPLANT
BINDER BREAST XLRG (GAUZE/BANDAGES/DRESSINGS) IMPLANT
BINDER BREAST XXLRG (GAUZE/BANDAGES/DRESSINGS) IMPLANT
BLADE SURG 15 STRL LF DISP TIS (BLADE) ×2 IMPLANT
BLADE SURG 15 STRL SS (BLADE) ×3
CANISTER SUCT 1200ML W/VALVE (MISCELLANEOUS) IMPLANT
CHLORAPREP W/TINT 26ML (MISCELLANEOUS) ×3 IMPLANT
CLIP TI WIDE RED SMALL 6 (CLIP) ×1 IMPLANT
COVER BACK TABLE 60X90IN (DRAPES) ×3 IMPLANT
COVER MAYO STAND STRL (DRAPES) ×3 IMPLANT
DECANTER SPIKE VIAL GLASS SM (MISCELLANEOUS) IMPLANT
DERMABOND ADVANCED (GAUZE/BANDAGES/DRESSINGS)
DERMABOND ADVANCED .7 DNX12 (GAUZE/BANDAGES/DRESSINGS) IMPLANT
DEVICE DUBIN W/COMP PLATE 8390 (MISCELLANEOUS) IMPLANT
DRAPE LAPAROSCOPIC ABDOMINAL (DRAPES) ×3 IMPLANT
DRSG TEGADERM 4X4.75 (GAUZE/BANDAGES/DRESSINGS) ×3 IMPLANT
ELECT COATED BLADE 2.86 ST (ELECTRODE) ×3 IMPLANT
ELECT REM PT RETURN 9FT ADLT (ELECTROSURGICAL) ×3
ELECTRODE REM PT RTRN 9FT ADLT (ELECTROSURGICAL) ×2 IMPLANT
GLOVE BIO SURGEON STRL SZ7 (GLOVE) ×3 IMPLANT
GLOVE BIOGEL PI IND STRL 7.5 (GLOVE) ×2 IMPLANT
GLOVE BIOGEL PI INDICATOR 7.5 (GLOVE) ×1
GOWN STRL REUS W/ TWL LRG LVL3 (GOWN DISPOSABLE) ×6 IMPLANT
GOWN STRL REUS W/TWL LRG LVL3 (GOWN DISPOSABLE) ×9
HEMOSTAT ARISTA ABSORB 3G PWDR (MISCELLANEOUS) ×1 IMPLANT
ILLUMINATOR WAVEGUIDE N/F (MISCELLANEOUS) IMPLANT
LIGHT WAVEGUIDE WIDE FLAT (MISCELLANEOUS) IMPLANT
NDL HYPO 25X1 1.5 SAFETY (NEEDLE) ×2 IMPLANT
NEEDLE HYPO 25X1 1.5 SAFETY (NEEDLE) ×3 IMPLANT
NS IRRIG 1000ML POUR BTL (IV SOLUTION) IMPLANT
PACK BASIN DAY SURGERY FS (CUSTOM PROCEDURE TRAY) ×3 IMPLANT
PENCIL BUTTON HOLSTER BLD 10FT (ELECTRODE) ×3 IMPLANT
SLEEVE SCD COMPRESS KNEE MED (MISCELLANEOUS) ×3 IMPLANT
SPONGE GAUZE 4X4 12PLY STER LF (GAUZE/BANDAGES/DRESSINGS) ×3 IMPLANT
SPONGE LAP 4X18 X RAY DECT (DISPOSABLE) ×3 IMPLANT
STRIP CLOSURE SKIN 1/2X4 (GAUZE/BANDAGES/DRESSINGS) ×1 IMPLANT
SUT MNCRL AB 3-0 PS2 18 (SUTURE) IMPLANT
SUT MNCRL AB 4-0 PS2 18 (SUTURE) IMPLANT
SUT MON AB 5-0 PS2 18 (SUTURE) ×3 IMPLANT
SUT SILK 2 0 SH (SUTURE) ×3 IMPLANT
SUT VIC AB 2-0 SH 27 (SUTURE) ×3
SUT VIC AB 2-0 SH 27XBRD (SUTURE) ×2 IMPLANT
SUT VIC AB 3-0 SH 27 (SUTURE) ×3
SUT VIC AB 3-0 SH 27X BRD (SUTURE) ×2 IMPLANT
SUT VIC AB 5-0 PS2 18 (SUTURE) IMPLANT
SUT VICRYL AB 3 0 TIES (SUTURE) IMPLANT
SYR CONTROL 10ML LL (SYRINGE) ×3 IMPLANT
TOWEL OR 17X24 6PK STRL BLUE (TOWEL DISPOSABLE) ×3 IMPLANT
TOWEL OR NON WOVEN STRL DISP B (DISPOSABLE) ×3 IMPLANT
TUBE CONNECTING 20X1/4 (TUBING) IMPLANT
YANKAUER SUCT BULB TIP NO VENT (SUCTIONS) IMPLANT

## 2016-06-02 NOTE — Discharge Instructions (Signed)
[facility] °Office Phone Number [phone] ° °POST OP INSTRUCTIONS ° °Always review your discharge instruction sheet given to you by the facility where your surgery was performed. ° °IF YOU HAVE DISABILITY OR FAMILY LEAVE FORMS, YOU MUST BRING THEM TO THE OFFICE FOR PROCESSING.  DO NOT GIVE THEM TO YOUR DOCTOR. ° °1. A prescription for pain medication may be given to you upon discharge.  Take your pain medication as prescribed, if needed.  If narcotic pain medicine is not needed, then you may take acetaminophen (Tylenol), naprosyn (Alleve) or ibuprofen (Advil) as needed. °2. Take your usually prescribed medications unless otherwise directed °3. If you need a refill on your pain medication, please contact your pharmacy.  They will contact our office to request authorization.  Prescriptions will not be filled after 5pm or on week-ends. °4. You should eat very light the first 24 hours after surgery, such as soup, crackers, pudding, etc.  Resume your normal diet the day after surgery. °5. Most patients will experience some swelling and bruising in the breast.  Ice packs and a good support bra will help.  Wear the breast binder provided or a sports bra for 72 hours day and night.  After that wear a sports bra during the day until you return to the office. Swelling and bruising can take several days to resolve.  °6. It is common to experience some constipation if taking pain medication after surgery.  Increasing fluid intake and taking a stool softener will usually help or prevent this problem from occurring.  A mild laxative (Milk of Magnesia or Miralax) should be taken according to package directions if there are no bowel movements after 48 hours. °7. Unless discharge instructions indicate otherwise, you may remove your bandages 48 hours after surgery and you may shower at that time.  You may have steri-strips (small skin tapes) in place directly over the incision.  These strips should be left on the  skin for 7-10 days and will come off on their own.  If your surgeon used skin glue on the incision, you may shower in 24 hours.  The glue will flake off over the next 2-3 weeks.  Any sutures or staples will be removed at the office during your follow-up visit. °8. ACTIVITIES:  You may resume regular daily activities (gradually increasing) beginning the next day.  Wearing a good support bra or sports bra minimizes pain and swelling.  You may have sexual intercourse when it is comfortable. °a. You may drive when you no longer are taking prescription pain medication, you can comfortably wear a seatbelt, and you can safely maneuver your car and apply brakes. °b. RETURN TO WORK:  ______________________________________________________________________________________ °9. You should see your doctor in the office for a follow-up appointment approximately two weeks after your surgery.  Your doctor’s nurse will typically make your follow-up appointment when she calls you with your pathology report.  Expect your pathology report 3-4 business days after your surgery.  You may call to check if you do not hear from us after three days. °10. OTHER INSTRUCTIONS: _______________________________________________________________________________________________ _____________________________________________________________________________________________________________________________________ °_____________________________________________________________________________________________________________________________________ °_____________________________________________________________________________________________________________________________________ ° °WHEN TO CALL DR WAKEFIELD: °1. Fever over 101.0 °2. Nausea and/or vomiting. °3. Extreme swelling or bruising. °4. Continued bleeding from incision. °5. Increased pain, redness, or drainage from the incision. ° °The clinic staff is available to answer your questions during regular  business hours.  Please don’t hesitate to call and ask to speak to one of the nurses for clinical concerns.  If   you have a medical emergency, go to the nearest emergency room or call 911.  A surgeon from [facility] is always on call at the hospital. ° °For further questions, please visit [email or web address] ° ° ° °Post Anesthesia Home Care Instructions ° °Activity: °Get plenty of rest for the remainder of the day. A responsible adult should stay with you for 24 hours following the procedure.  °For the next 24 hours, DO NOT: °-Drive a car °-Operate machinery °-Drink alcoholic beverages °-Take any medication unless instructed by your physician °-Make any legal decisions or sign important papers. ° °Meals: °Start with liquid foods such as gelatin or soup. Progress to regular foods as tolerated. Avoid greasy, spicy, heavy foods. If nausea and/or vomiting occur, drink only clear liquids until the nausea and/or vomiting subsides. Call your physician if vomiting continues. ° °Special Instructions/Symptoms: °Your throat may feel dry or sore from the anesthesia or the breathing tube placed in your throat during surgery. If this causes discomfort, gargle with warm salt water. The discomfort should disappear within 24 hours. ° °If you had a scopolamine patch placed behind your ear for the management of post- operative nausea and/or vomiting: ° °1. The medication in the patch is effective for 72 hours, after which it should be removed.  Wrap patch in a tissue and discard in the trash. Wash hands thoroughly with soap and water. °2. You may remove the patch earlier than 72 hours if you experience unpleasant side effects which may include dry mouth, dizziness or visual disturbances. °3. Avoid touching the patch. Wash your hands with soap and water after contact with the patch. °  ° °

## 2016-06-02 NOTE — Op Note (Signed)
Preoperative diagnosis: leftbreast cancer with positive superior margin Postoperative diagnosis: same as above Procedure: excision of superior margin left breast Surgeon Dr Serita Grammes Anes general  EBL: minimal Comps none Specimen: left breast superior margin short superior long lateral double deep Sponge count correct at completion dispo to recovery stable  Indications: This is a 61 yof with history documented prior notes.  She had positive superior margin on left and would still like to pursue bct.  She and I discussed excision of this margin.  She also has what might be seroma in left axilla I told her I would attempt to aspirate.   Procedure: After informed consent was obtained the patient was taken to the OR.  She was given anitibiotics. SCDs were in place. She was prepped and draped in the standard sterile surgical fashion. A timeout was performed.  I reentered her left breast incision and released all sutures. I then removed the entire superior margin and marked as above.  I then obtained hemostasis. A superior clip was placed.  I then attempted to aspirate left axilla but there is not much seroma there.  I then closed this with 2-0 vicryl, 3-0 vicryl and 4-0 monocryl. Glue and steristrips were placed A breast binder was placed. She was extubated and transferred to recovery stable

## 2016-06-02 NOTE — Transfer of Care (Signed)
Immediate Anesthesia Transfer of Care Note  Patient: Sabrina Tapia  Procedure(s) Performed: Procedure(s): RE-EXCISION OF BREAST CANCER,SUPERIOR MARGINS (Left) DRAINAGE of left axilla seroma (Left)  Patient Location: PACU  Anesthesia Type:General  Level of Consciousness: awake and patient cooperative  Airway & Oxygen Therapy: Patient Spontanous Breathing and Patient connected to face mask oxygen  Post-op Assessment: Report given to RN and Post -op Vital signs reviewed and stable  Post vital signs: Reviewed and stable  Last Vitals:  Vitals:   06/02/16 1137 06/02/16 1138  BP: 101/63   Pulse: 92 92  Resp: (!) 21 18  Temp:      Last Pain:  Vitals:   06/02/16 1006  TempSrc: Oral  PainSc: 3          Complications: No apparent anesthesia complications

## 2016-06-02 NOTE — Interval H&P Note (Signed)
History and Physical Interval Note:  06/02/2016 10:55 AM  Sabrina Tapia  has presented today for surgery, with the diagnosis of LEFT BREAST CANCER POSITIVE SUPERIOR MARGIN  The various methods of treatment have been discussed with the patient and family. After consideration of risks, benefits and other options for treatment, the patient has consented to  Procedure(s): RE-EXCISION OF BREAST CANCER,SUPERIOR MARGINS (Left) as a surgical intervention .  The patient's history has been reviewed, patient examined, no change in status, stable for surgery.  I have reviewed the patient's chart and labs.  Questions were answered to the patient's satisfaction.     Sabrina Tapia

## 2016-06-02 NOTE — Anesthesia Postprocedure Evaluation (Signed)
Anesthesia Post Note  Patient: [place] SHAAKIRA MOHRMAN  Procedure(s) Performed: Procedure(s) (LRB): RE-EXCISION OF BREAST CANCER,SUPERIOR MARGINS (Left) DRAINAGE of left axilla seroma (Left)  Patient location during evaluation: PACU Anesthesia Type: General Level of consciousness: awake and alert Pain management: pain level controlled Vital Signs Assessment: post-procedure vital signs reviewed and stable Respiratory status: spontaneous breathing, nonlabored ventilation, respiratory function stable and patient connected to nasal cannula oxygen Cardiovascular status: blood pressure returned to baseline and stable Postop Assessment: no signs of nausea or vomiting Anesthetic complications: no    Last Vitals:  Vitals:   06/02/16 1200 06/02/16 1204  BP: 108/69   Pulse: 80 81  Resp: 13 13  Temp:      Last Pain:  Vitals:   06/02/16 1204  TempSrc:   PainSc: 3                  Effie Berkshire

## 2016-06-02 NOTE — Anesthesia Preprocedure Evaluation (Signed)
Anesthesia Evaluation  Patient identified by MRN, date of birth, ID band Patient awake    Reviewed: Allergy & Precautions, NPO status , Patient's Chart, lab work & pertinent test results  Airway Mallampati: II  TM Distance: >3 FB     Dental no notable dental hx. (+) Dental Advisory Given   Pulmonary asthma , former smoker,    Pulmonary exam normal        Cardiovascular negative cardio ROS Normal cardiovascular exam     Neuro/Psych PSYCHIATRIC DISORDERS Anxiety negative neurological ROS     GI/Hepatic Neg liver ROS, GERD  Medicated,  Endo/Other  negative endocrine ROS  Renal/GU negative Renal ROS     Musculoskeletal  (+) Arthritis ,   Abdominal   Peds  Hematology negative hematology ROS (+)   Anesthesia Other Findings   Reproductive/Obstetrics                             Anesthesia Physical  Anesthesia Plan  ASA: II  Anesthesia Plan: General   Post-op Pain Management:  Regional for Post-op pain   Induction: Intravenous  Airway Management Planned: LMA  Additional Equipment:   Intra-op Plan:   Post-operative Plan: Extubation in OR  Informed Consent: I have reviewed the patients History and Physical, chart, labs and discussed the procedure including the risks, benefits and alternatives for the proposed anesthesia with the patient or authorized representative who has indicated his/her understanding and acceptance.   Dental advisory given  Plan Discussed with: CRNA and Anesthesiologist  Anesthesia Plan Comments:         Anesthesia Quick Evaluation

## 2016-06-02 NOTE — Anesthesia Procedure Notes (Signed)
Procedure Name: LMA Insertion Date/Time: 06/02/2016 11:02 AM Performed by: Toula Moos L Pre-anesthesia Checklist: Patient identified, Emergency Drugs available, Suction available, Patient being monitored and Timeout performed Patient Re-evaluated:Patient Re-evaluated prior to inductionOxygen Delivery Method: Circle system utilized Preoxygenation: Pre-oxygenation with 100% oxygen Intubation Type: IV induction Ventilation: Mask ventilation without difficulty LMA: LMA inserted LMA Size: 4.0 Number of attempts: 1 Airway Equipment and Method: Bite block Placement Confirmation: positive ETCO2 Tube secured with: Tape Dental Injury: Teeth and Oropharynx as per pre-operative assessment

## 2016-06-02 NOTE — H&P (View-Only) (Signed)
Sabrina Tapia is an 65 y.o. female.   Chief Complaint: bilateral breast cancer HPI: 52 yof who has undergone a right breast seed guided lumpectomy and right axillary sn biopsy recently. she was noted to have small right breast cancer initially and then at surgery was noted to have a 1.4 cm invasive ductal carcinoma with associated dcis and these margins were clear. I palpated an abnormal area at the medial margin and removed this. This was a 6 mm ILC that focally involved the final medial margin and was not detected preop. one sentinel node shows metastatic disease. The IDC is er/pr positive and her 2 negative with low Ki. the [place] is 100% er/pr positive, Ki is 3# and her 2 negative. due to the ilc and concern I ordered an mri postop. she has no postop issues. the mri shows a right breast seroma and then two enhancing masses posterior to the lumpectomy cavity measuring 1 and 50mm in size. these appear to be nodes and could represent reactive postop nodes. the left breast has a 1.3 cm irregular enhancing mass. there are no other abnl axillary or mammary nodes. I discussed case with Dr Marcelo Baldy and he biopsied one of small posterior to lumpectomy cavity masses on right and this is benign node. I think these are just reactive. On the left he could not identify the mr finding but did not another left breast mass (this is not one on mri). this has undergone biopsy and is IDC. she now has also had the mri found mass biopsied and is idc. mammaprint negative on left side.   Past Medical History:  Diagnosis Date  . Anxiety   . Arthritis   . Asthma    allergy induced  . Cancer ([place]) 2017   bil breast cancer  . Cervical polyp   . Detrusor instability   . GERD (gastroesophageal reflux disease)   . Heart burn   . Hemorrhoids   . Hypertonicity of bladder   . Joint pain   . LGSIL (low grade squamous intraepithelial dysplasia) 2001   colpo biopsy negative, negative paps since  . Osteopenia  02/2016   T score -1.3 FRAX 8.3%/0.7% stable from prior DEXA    Past Surgical History:  Procedure Laterality Date  . RADIOACTIVE SEED GUIDED MASTECTOMY WITH AXILLARY SENTINEL LYMPH NODE BIOPSY Right 03/26/2016   Procedure: RIGHT BREAST LUMPECTOMY WITH RADIOACTIVE SEED AND SENTINEL LYMPH NODE BIOPSY;  Surgeon: Rolm Bookbinder, MD;  Location: [facility];  Service: General;  Laterality: Right;    Family History  Problem Relation Age of Onset  . Hypertension Mother   . Heart disease Mother   . Lung disease Mother   . Hypertension Father   . Cancer Father     colon  . Heart disease Father   . Lung cancer Father   . Breast cancer Paternal Grandmother     Age 76's  . Hypertension Brother   . Hyperlipidemia Brother   . Diabetes Maternal Grandfather    Social History:  reports that she quit smoking about 31 years ago. She has never used smokeless tobacco. She reports that she drinks about 7.2 oz of alcohol per week . She reports that she does not use drugs.  Allergies: No Known Allergies  Medications Prior to Admission  Medication Sig Dispense Refill  . acetaminophen (TYLENOL) 500 MG tablet Take 1,000 mg by mouth every 6 (six) hours as needed.    Marland Kitchen albuterol (PROAIR HFA) 108 (90 Base) MCG/ACT inhaler  Inhale 2 puffs into the lungs every 4 (four) hours as needed for wheezing or shortness of breath. 1 Inhaler 1  . ALPRAZolam (XANAX) 0.25 MG tablet Take 1 tablet (0.25 mg total) by mouth at bedtime as needed. for sleep 30 tablet 1  . Calcium Carbonate-Vitamin D (CALCIUM + D PO) Take by mouth.    . cetirizine (ZYRTEC) 10 MG tablet Take 10 mg by mouth.    . docusate sodium (COLACE) 50 MG capsule Take 50 mg by mouth.    . fluticasone (FLONASE) 50 MCG/ACT nasal spray Place 2 sprays into both nostrils daily. 16 g 5  . hydrocortisone (ANUSOL-HC) 2.5 % rectal cream Place 1 application rectally 4 (four) times daily. 30 g 0  . hydrocortisone (ANUSOL-HC) 25 MG suppository UNWRAP AND  INSERT 1 SUPPOSITORY RECTALLY 2 TIMES DAILY.    Marland Kitchen LUTEIN PO Take by mouth.    . Multiple Vitamin (MULTIVITAMIN) tablet Take 1 tablet by mouth daily.    . Omega-3 Fatty Acids (FISH OIL PO) Take by mouth.    . polyethylene glycol (MIRALAX / GLYCOLAX) packet Take 17 g by mouth daily.    Marland Kitchen PRILOSEC OTC 20 MG tablet Reported on 12/14/2015    . Probiotic Product (PROBIOTIC DAILY PO) Take by mouth.    Marland Kitchen QVAR 40 MCG/ACT inhaler INHALE 2 PUFFS BY MOUTH TWICE DAILY TO PREVENT COUGH OR WHEEZING. RINSE, GARGLE AND SPIT AFTER USE 8.7 g 4  . solifenacin (VESICARE) 5 MG tablet Take 1 tablet (5 mg total) by mouth daily. 30 tablet 12  . Turmeric 450 MG CAPS Take by mouth.    . venlafaxine XR (EFFEXOR XR) 37.5 MG 24 hr capsule Take 1 capsule (37.5 mg total) by mouth daily with breakfast. (Patient taking differently: Take 75 mg by mouth daily with breakfast. ) 30 capsule 6  . lidocaine (XYLOCAINE) 5 % ointment Apply 1 application topically as needed. 35.44 g 0    No results found for this or any previous visit (from the past 48 hour(s)). Nm Sentinel Node Inj-no Rpt (breast)  Result Date: 05/20/2016 CLINICAL DATA: left axillary sn biopsy Sulfur colloid was injected intradermally by the nuclear medicine technologist for breast cancer sentinel node localization.    ROS Negative  Blood pressure 110/69, pulse 77, temperature 98.1 F (36.7 C), temperature source Oral, resp. rate 13, height 5\' 4"  (1.626 m), weight 64.9 kg (143 lb), SpO2 100 %. Physical Exam  cv rrr pulm clear bilaterally Right breast incision healing well No adenopathy Assessment/Plan  Assessment & Plan  BREAST CANCER OF UPPER-OUTER QUADRANT OF LEFT FEMALE BREAST (C50.412) Story: again would like to do bct. we discussed mastectomy vs double lumpectomy on this side. understands mastectomy has been historical treatment for this but I think double lumpectomy reasonable with seed guidance. certainly a concern about how to follow her given the  findings we have had though. I also discussed alnd vs tad on this side and she would like to proceed with double lumpectomy and tad.  BREAST CANCER OF UPPER-OUTER QUADRANT OF RIGHT FEMALE BREAST (C50.411) Story: She very much would like to continue with breast conservation and not have alnd. I still think reasonable to excise medial margin on the right and do radiotherapy for whole breast and axilla.  Rolm Bookbinder, MD 05/20/2016, 8:15 AM

## 2016-06-03 ENCOUNTER — Encounter (HOSPITAL_BASED_OUTPATIENT_CLINIC_OR_DEPARTMENT_OTHER): Payer: Self-pay | Admitting: General Surgery

## 2016-06-04 NOTE — Progress Notes (Signed)
Location of Breast Cancer: Right and Left Breast Cancer  Histology per Pathology Report:  03/06/16 Diagnosis Breast, right, needle core biopsy INVASIVE DUCTAL CARCINOMA WITH CALCIFICATIONS, GRADE 1 DUCTAL CARCINOMA IN SITU IS PRESENT  Receptor Status: ER(95%), PR (95%), Her2-neu (NEG), Ki-(12%)  03/26/16 Diagnosis 1. Breast, lumpectomy, Right - INVASIVE AND IN SITU DUCTAL CARCINOMA, 1.4 CM. - MARGINS NOT INVOLVED. - PREVIOUS BIOPSY SITE. - FIBROCYSTIC CHANGES WITH CALCIFICATIONS. 2. Lymph node, sentinel, biopsy, Right Axillary - METASTATIC DUCTAL CARCINOMA IN ONE NODE (1/1). 3. Breast, excision, Right additional Medial Margin - INVASIVE LOBULAR CARCINOMA, 0.6 CM. - LOBULAR CARCINOMA IN SITU - INVASIVE LOBULAR CARCINOMA FOCALLY INVOLVES FINAL MEDIAL MARGIN. - DUCTAL CARCINOMA IN SITU, 0.2 CM. - FIBROCYSTIC CHANGES WITH CALCIFICATIONS. 4. Breast, excision, Right additional Inferior Margin - FIBROCYSTIC CHANGES WITH CALCIFICATIONS. - FINAL INFERIOR MARGIN CLEAR. 5. Breast, excision, Right additional Superior Margin - FOCAL LOBULAR NEOPLASIA (ATYPICAL LOBULAR HYPERPLASIA). - FIBROCYSTIC CHANGES WITH CALCIFICATIONS. - FINAL SUPERIOR MARGIN CLEAR.  Receptor status: ER (100%), PR (100%), Her2 (NEG), Ki67 (3%)  04/09/16 Diagnosis 1. Breast, left, needle core biopsy - INVASIVE DUCTAL CARCINOMA. - DUCTAL CARCINOMA IN SITU. - SEE COMMENT. 2. Lymph node, needle/core biopsy, right breast - ONE BENIGN LYMPH NODE WITH NO TUMOR SEEN (0/1).  1. ADDENDUM: Of note, there is insufficient tissue remaining from the left breast needle core biopsy for additional studies including a breast prognostic profile. (RAH:gt, 04/10/16)  04/11/16 Diagnosis Breast, left, needle core biopsy, UOQ - INVASIVE DUCTAL CARCINOMA. - DUCTAL CARCINOMA IN SITU WITH ASSOCIATED CALCIFICATIONS.  Receptor status: ER (10%), PR (NEG), Ki67 (2%), Her2 (NEG)  04/14/16 Diagnosis Lymph node, needle/core biopsy, left  axillary - POSITIVE FOR METASTATIC DUCTAL CARCINOMA  Receptor status: ER (NEG), PR (NEG), Ki67 (10%), Her2 (NEG)  05/20/16 Diagnosis 1. Breast, excision, Right additional Medial Margin - FIBROCYSTIC CHANGES WITH CALCIFICATIONS. - LOBULAR NEOPLASIA (ATYPICAL LOBULAR HYPERPLASIA). - PREVIOUS BIOPSY SITE REACTION. - ONE BENIGN LYMPH NODE (0/1). - FINAL MEDIAL MARGIN CLEAR. 2. Lymph nodes, regional resection, Left Axillary - METASTATIC CARCINOMA IN TWO OF FOUR LYMPH NODES (2/4). 3. Breast, lumpectomy, Left Inferior - INVASIVE AND IN SITU DUCTAL CARCINOMA, 1.5 CM - INVASIVE CARCINOMA 0.1 CM FROM POSTERIOR, SUPERIOR AND MEDIAL MARGINS. - PREVIOUS BIOPSY SITE. 4. Breast, lumpectomy, Left Superior - INVASIVE AND IN SITU DUCTAL CARCINOMA. - INVASIVE CARCINOMA FOCALLY LESS THAN 0.1 CM FROM THE INFERIOR MARGIN AND 0.1 CM FROM POSTERIOR MARGIN. - PREVIOUS BIOPSY SITE REACTION. 5. Breast, excision, Left additional Superior Margin - INVASIVE AND IN SITU DUCTAL CARCINOMA, 1.5 CM. - INVASIVE CARCINOMA FOCALLY INVOLVES FINAL SUPERIOR MARGIN. 6. Breast, excision, Left additional Lateral Margin - MICROSCOPIC FOCI OF INVASIVE AND IN SITU DUCTAL CARCINOMA. - INVASIVE AND IN SITU DUCTAL CARCINOMA 0.4 CM FROM FINAL LATERAL MARGIN.  06/02/16 Diagnosis Breast, excision, Additional Left Superior Margin - INVASIVE DUCTAL CARCINOMA. - DUCTAL CARCINOMA IN SITU WITH CALCIFICATIONS. - INVASIVE CARCINOMA IS FOCALLY PRESENT AT THE NEW MARGIN IN SEVERAL AREAS.  Did patient present with symptoms or was this found on screening mammography?: The Right Breast cancer was found on a screening mammogram. The Left Breast Cancer was found on a post op MRI ordered by Dr. Donne Hazel after her Right Lumpectomy.   Past/Anticipated interventions by surgeon, if any: 03/26/16 Procedure: 1. Right breast seed guided lumpectomy  2. Rightaxillary sentinel nodebiopsy Surgeon Dr Serita Grammes  05/20/16 Procedure: 1. Right  breast re-excision lumpectomy of medial margin 2. Left breast lumpectomy seed guided times two 3. Left targeted axillary dissection  Surgeon Dr Serita Grammes  06/02/16 Procedure: excision of superior margin left breast Surgeon Dr Serita Grammes  Past/Anticipated interventions by medical oncology, if any:   Lymphedema issues, if any:    Pain issues, if any:    SAFETY ISSUES:  Prior radiation?  Pacemaker/ICD?   Possible current pregnancy?  Is the patient on methotrexate?   Current Complaints / other details:   GYNECOLOGIC HISTORY:  No LMP recorded. Patient is postmenopausal. Menarche age 65, the patient is GX P0.  She went through the change of life at age 24 and took hormone replacement for 19 years, stopping approximately 2006     Sabrina Tapia, Stephani Police, RN 06/04/2016,10:20 AM

## 2016-06-06 ENCOUNTER — Ambulatory Visit (HOSPITAL_BASED_OUTPATIENT_CLINIC_OR_DEPARTMENT_OTHER): Payer: BC Managed Care – PPO | Admitting: Oncology

## 2016-06-06 VITALS — BP 170/80 | HR 71 | Temp 98.4°F | Resp 18 | Ht 64.0 in | Wt 146.0 lb

## 2016-06-06 DIAGNOSIS — Z171 Estrogen receptor negative status [ER-]: Secondary | ICD-10-CM

## 2016-06-06 DIAGNOSIS — C50411 Malignant neoplasm of upper-outer quadrant of right female breast: Secondary | ICD-10-CM | POA: Diagnosis not present

## 2016-06-06 DIAGNOSIS — C50112 Malignant neoplasm of central portion of left female breast: Secondary | ICD-10-CM | POA: Diagnosis not present

## 2016-06-06 DIAGNOSIS — Z170 Estrogen receptor positive status [ER+]: Secondary | ICD-10-CM

## 2016-06-06 DIAGNOSIS — C773 Secondary and unspecified malignant neoplasm of axilla and upper limb lymph nodes: Secondary | ICD-10-CM

## 2016-06-06 DIAGNOSIS — C50412 Malignant neoplasm of upper-outer quadrant of left female breast: Secondary | ICD-10-CM | POA: Diagnosis not present

## 2016-06-06 DIAGNOSIS — M8580 Other specified disorders of bone density and structure, unspecified site: Secondary | ICD-10-CM

## 2016-06-06 NOTE — Progress Notes (Signed)
[place]  Telephone:(336) [phone] Fax:(336) [phone]     ID: Sabrina Tapia DOB: July 16, 1951  MR#: [id number]  HYQ#:[id number]  Patient Care Team: Josetta Huddle, MD as PCP - General (Internal Medicine) Rolm Bookbinder, MD as Consulting Physician (General Surgery) Chauncey Cruel, MD as Consulting Physician (Oncology) Anastasio Auerbach, MD as Consulting Physician (Gynecology) OTHER MD:  CHIEF COMPLAINT: Estrogen receptor positive breast cancer  CURRENT TREATMENT: Awaiting definitive surgery   BREAST CANCER HISTORY: From the earlier summary note:  "Sabrina Tapia" had screening mammography at her gynecologist's office suggestive of a change in the upper-outer quadrant of the right breast. She was referred to [facility] where on 03/04/2016 she underwent bilateral diagnostic mammography and right breast ultrasonography. The breast density was category C. In the upper outer quadrant of the right breast there was a new area of architectural distortion. By ultrasonography this measured 0.5 cm. The right axilla was reported as sonographically benign.  On 03/06/2016 she underwent core needle biopsy of the right breast mass in question, and this showed ([id number]) an invasive ductal carcinoma, grade 1, estrogen receptor 95% positive, progesterone receptor 95% positive, both with strong staining intensity, with an MIB-1 of 12%, and no HER-2 amplification, the signals ratio being 1.54 and the number per cell 2.15.  Her case was presented in the multidisciplinary breast cancer conference 03/12/2016.Marland Kitchen At that time a preliminary plan was proposed: Breast conserving surgery with sentinel lymph node sampling, no Oncotype if the tumor was no larger than 5 mm, and consideration of genetics testing.  On 03/26/2016 Sabrina Tapia underwent right lumpectomy and sentinel lymph node sampling. This showed ([id number]) an invasive ductal carcinoma measuring 1.4 cm, grade 1, with negative margins, and a  macrometastatic deposit of ductal carcinoma in the single sentinel lymph node. Mammaprint from this tumor was read as "low risk".  However there was a separate invasive lobular carcinoma measuring 0.6 cm,  focally involving the final right medial margin. Because of the lobular breast cancer bilateral breast MRI was obtained 04/04/2016. This showed in the right breast a 7.4 x 4.5 cm seroma with 2 enhancing masses posterior to the lumpectomy cavity, the larger measuring 1.0 cm. These were felt possibly to be reactive lymph nodes. One of these lymph nodes was biopsied 04/09/2016 and this showed ([id number]) benign lymph node tissue. The MRI showed no evidence of additional disease in the right breast.  However in the left breast centrally there was a 1.3 cm enhancing mass. Biopsy of this left breast mass 04/09/2016 ([id number]) showed an invasive ductal carcinoma, grade 1 or 2, with insufficient tissue for a prognostic panel. On 04/11/2016 biopsy of a second left breast mass, upper outer quadrant, 4.6 cm a way from the other left breast mass, showed ([id number]) an invasive ductal carcinoma, grade 2, estrogen receptor 10% positive with strong staining intensity, progesterone receptor negative, with an MIB-1 of 2%, and no HER-2 amplification, the signals ratio being 1.50 and the number per cell 2.10. The proliferation fraction was 2%.  On 04/14/2016 Sabrina Tapia underwent biopsy of a suspicious left axillary lymph node and this ([id number]) was positive for invasive ductal carcinoma, estrogen and progesterone receptor negative, with an MIB-1 of 10%. I do not find that HER-2 was repeated. Mammaprint from this tumor also came back low risk.  Her subsequent history is as detailed below.  INTERVAL HISTORY: Sabrina Tapia returns today accompanied by a friend.. She was phoned by her surgeon letting her know that she is going to  need a left mastectomy and she has asked [place] to work her in to discuss this further.  Since  her last visit here she underwent left lumpectomy and sentinel lymph node sampling, on 05/20/2016. The final pathology ([id number]) found a left sided invasive ductal carcinoma measuring 1.5 cm, and involving 2 of the 4 lymph nodes (non-sentinel) sampled. Unfortunately multiple margins were close or positive. Accordingly she had further surgery 06/02/2016 to clear the positive superior margin and this (SZA [id number]) unfortunately showed that invasive carcinoma was still focally present at the new margin in several areas  Incidentally the positive medial margin from the right breast earlier excision was clear as a separate procedure during the 05/20/2016 surgery  Sabrina Tapia situation was discussed at the multidisciplinary breast cancer conference 06/04/2016. At that time it was felt that mastectomy on the left side was really the only choice. It was also discussed that the Mammaprint on the left-sided tumor came back as low risk so that she will not need chemotherapy for any of her separate breast cancers.  REVIEW OF SYSTEMS: Sabrina Tapia did well with the recent surgeries, although she wonders if anesthesia is going to "fry her brain". In particular she has had very little pain, no bleeding, and no fever. She is very pleased with the cosmetic results. She is very anxious about the recommendation regarding left mastectomy. She is sleeping poorly, describes herself is moderately fatigued, and is having more heartburn. She is a bit depressed. A detailed review of systems today was otherwise stable.  PAST MEDICAL HISTORY: Past Medical History:  Diagnosis Date  . Anxiety   . Arthritis   . Asthma    allergy induced  . Cancer ([place]) 2017   bil breast cancer  . Cervical polyp   . Detrusor instability   . GERD (gastroesophageal reflux disease)   . Heart burn   . Hemorrhoids   . Hypertonicity of bladder   . Joint pain   . LGSIL (low grade squamous intraepithelial dysplasia) 2001   colpo biopsy negative, negative  paps since  . Osteopenia 02/2016   T score -1.3 FRAX 8.3%/0.7% stable from prior DEXA    PAST SURGICAL HISTORY: Past Surgical History:  Procedure Laterality Date  . INCISION AND DRAINAGE Left 06/02/2016   Procedure: DRAINAGE of left axilla seroma;  Surgeon: Rolm Bookbinder, MD;  Location: [place];  Service: General;  Laterality: Left;  . RADIOACTIVE SEED GUIDED MASTECTOMY WITH AXILLARY SENTINEL LYMPH NODE BIOPSY Right 03/26/2016   Procedure: RIGHT BREAST LUMPECTOMY WITH RADIOACTIVE SEED AND SENTINEL LYMPH NODE BIOPSY;  Surgeon: Rolm Bookbinder, MD;  Location: [place];  Service: General;  Laterality: Right;  . RADIOACTIVE SEED GUIDED MASTECTOMY WITH AXILLARY SENTINEL LYMPH NODE BIOPSY Left 05/20/2016   Procedure: LEFT BREAST RADIOACTIVE SEED (two seeds) GUIDED LUMPECTOMY WITH LEFT AXILLARY SENTINEL LYMPH NODE BIOPSY AND LEFT SEED TARGETED AXILLARY LYMPH NODE EXCISION;  Surgeon: Rolm Bookbinder, MD;  Location: [place];  Service: General;  Laterality: Left;  . RE-EXCISION OF BREAST CANCER,SUPERIOR MARGINS Right 05/20/2016   Procedure: RE-EXCISION OF RIGHT BREAST MARGIN;  Surgeon: Rolm Bookbinder, MD;  Location: [place];  Service: General;  Laterality: Right;  . RE-EXCISION OF BREAST CANCER,SUPERIOR MARGINS Left 06/02/2016   Procedure: RE-EXCISION OF BREAST CANCER,SUPERIOR MARGINS;  Surgeon: Rolm Bookbinder, MD;  Location: [place];  Service: General;  Laterality: Left;    FAMILY HISTORY Family History  Problem Relation Age of Onset  . Hypertension Mother   .  Heart disease Mother   . Lung disease Mother   . Hypertension Father   . Cancer Father     colon  . Heart disease Father   . Lung cancer Father   . Breast cancer Paternal Grandmother     Age 13's  . Hypertension Brother   . Hyperlipidemia Brother   . Diabetes Maternal Grandfather   The patient's father died from lung cancer at the age  of 56. He also had a remote history of colon cancer. The patient's mother died at age 58. The patient has one brother, no sisters.  GYNECOLOGIC HISTORY:  No LMP recorded. Patient is postmenopausal. Menarche age 38, the patient is GX P0. 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 She lives alone, with no pets.    ADVANCED DIRECTIVES: In place. She has named her brother Sabrina Tapia as her healthcare part of attorney. He can be reached at [phone]/29/2006   HEALTH MAINTENANCE: Social History  Substance Use Topics  . Smoking status: Former Smoker    Quit date: 12/20/1984  . Smokeless tobacco: Never Used  . Alcohol use 7.2 oz/week    12 Cans of beer per week     Comment: daily beer     Colonoscopy:February 2016  PAP:  Bone density: 03/04/2016/osteopenia   No Known Allergies  Current Outpatient Prescriptions  Medication Sig Dispense Refill  . acetaminophen (TYLENOL) 500 MG tablet Take 1,000 mg by mouth every 6 (six) hours as needed.    Marland Kitchen albuterol (PROAIR HFA) 108 (90 Base) MCG/ACT inhaler Inhale 2 puffs into the lungs every 4 (four) hours as needed for wheezing or shortness of breath. 1 Inhaler 1  . ALPRAZolam (XANAX) 0.25 MG tablet Take 1 tablet (0.25 mg total) by mouth at bedtime as needed. for sleep 30 tablet 1  . beclomethasone (QVAR) 40 MCG/ACT inhaler INHALE 2 PUFFS BY MOUTH TWICE DAILY TO PREVENT COUGH OR WHEEZING. RINSE, GARGLE AND SPIT AFTER USE 8.7 g 4  . Calcium Carbonate-Vitamin D (CALCIUM + D PO) Take by mouth.    . cetirizine (ZYRTEC) 10 MG tablet Take 10 mg by mouth.    . docusate sodium (COLACE) 50 MG capsule Take 50 mg by mouth.    . fluticasone (FLONASE) 50 MCG/ACT nasal spray Place 2 sprays into both nostrils daily. 16 g 5  . LUTEIN PO Take by mouth.    . Multiple Vitamin (MULTIVITAMIN) tablet  Take 1 tablet by mouth daily.    . Omega-3 Fatty Acids (FISH OIL PO) Take by mouth.    . oxyCODONE-acetaminophen (PERCOCET) 10-325 MG tablet Take 1 tablet by mouth every 6 (six) hours as needed for pain. 20 tablet 0  . polyethylene glycol (MIRALAX / GLYCOLAX) packet Take 17 g by mouth daily.    Marland Kitchen PRILOSEC OTC 20 MG tablet Reported on 12/14/2015    . Probiotic Product (PROBIOTIC DAILY PO) Take by mouth.    . solifenacin (VESICARE) 5 MG tablet Take 1 tablet (5 mg total) by mouth daily. 30 tablet 12  . Turmeric 450 MG CAPS Take by mouth.    . venlafaxine XR (EFFEXOR-XR) 75 MG 24 hr capsule Take 1 capsule (75 mg total) by mouth daily with breakfast. 90 capsule 12   No current facility-administered medications for this visit.     OBJECTIVE: Middle-aged white woman Who  appears stated age 7:   06/06/16 1425  BP: (!) 170/80  Pulse: 71  Resp: 18  Temp: 98.4 F (36.9 C)     Body mass index is 25.06 kg/m.    ECOG FS:1 - Symptomatic but completely ambulatory  Sclerae unicteric, EOMs intact Oropharynx clear and moist No cervical or supraclavicular adenopathy Lungs no rales or rhonchi Heart regular rate and rhythm Abd soft, nontender, positive bowel sounds MSK no focal spinal tenderness, no upper extremity lymphedema Neuro: nonfocal, well oriented, appropriate affect Breasts: The right breast is status post recent lumpectomy. The incision is healing very nicely. There is no erythema, dehiscence, or swelling. The cosmetic result is good. The right axilla is benign. The left breast is also status post recent lumpectomy. This incision also is healing nicely and the cosmetic result is good. The left axilla is benign.   AB RESULTS:  CMP     Component Value Date/Time   NA 137 04/01/2016 1612   K 3.9 04/01/2016 1612   CO2 24 04/01/2016 1612   GLUCOSE 108 04/01/2016 1612   BUN 12.0 04/01/2016 1612   CREATININE 0.8 04/01/2016 1612   CALCIUM 9.3 04/01/2016 1612   PROT 6.9 04/01/2016 1612     ALBUMIN 3.5 04/01/2016 1612   AST 53 (H) 04/01/2016 1612   ALT 92 (H) 04/01/2016 1612   ALKPHOS 84 04/01/2016 1612   BILITOT 0.34 04/01/2016 1612    INo results found for: SPEP, UPEP  Lab Results  Component Value Date   WBC 7.6 04/01/2016   NEUTROABS 4.6 04/01/2016   HGB 14.2 04/01/2016   HCT 42.2 04/01/2016   MCV 91.3 04/01/2016   PLT 376 04/01/2016      Chemistry      Component Value Date/Time   NA 137 04/01/2016 1612   K 3.9 04/01/2016 1612   CO2 24 04/01/2016 1612   BUN 12.0 04/01/2016 1612   CREATININE 0.8 04/01/2016 1612      Component Value Date/Time   CALCIUM 9.3 04/01/2016 1612   ALKPHOS 84 04/01/2016 1612   AST 53 (H) 04/01/2016 1612   ALT 92 (H) 04/01/2016 1612   BILITOT 0.34 04/01/2016 1612       No results found for: LABCA2  No components found for: LABCA125  No results for input(s): INR in the last 168 hours.  Urinalysis    Component Value Date/Time   COLORURINE YELLOW 01/31/2016 1526   APPEARANCEUR CLEAR 01/31/2016 1526   LABSPEC 1.006 01/31/2016 1526   PHURINE 6.5 01/31/2016 1526   GLUCOSEU NEGATIVE 01/31/2016 1526   HGBUR NEGATIVE 01/31/2016 [place] 01/31/2016 [place] 01/31/2016 1526   PROTEINUR NEGATIVE 01/31/2016 1526   UROBILINOGEN 0.2 12/11/2014 1506   NITRITE NEGATIVE 01/31/2016 1526   LEUKOCYTESUR TRACE (A) 01/31/2016 1526     STUDIES: Nm Sentinel Node Inj-no Rpt (breast)  Result Date: 05/20/2016 CLINICAL DATA: left axillary sn biopsy Sulfur colloid was injected intradermally by the nuclear medicine technologist for breast cancer sentinel node localization.    ELIGIBLE FOR AVAILABLE RESEARCH PROTOCOL: no  ASSESSMENT: 65 y.o. [place] woman status post right breast upper outer quadrant lumpectomy 03/26/2016 for a pT1c pN1, stage IIA invasive ductal carcinoma, grade 1, estrogen and progesterone receptor positive, HER-2 nonamplified, with an MIB-1 of 12%   (a) mammaprint from this  tumor was read as "low risk".    (1) a second right breast cancer, invasive lobular, grade 1, measuring 0.6 cm, focally involved the final right medial  margin from the 03/26/2016 procedure; this tumor also was estrogen and progesterone receptor positive, HER-2 negative, with an MIB-1 of 3%  (a) medial margin cleared with additional surgery 05/20/2016.  (2) two biopsies from the left breast 04/09/2016 and 04/11/2016, 4.6 cm apart, showed  (a) centrally, an invasive ductal carcinoma, grade 1 or 2, with no prognostic panel available  (b) in the upper outer quadrant, invasive ductal carcinoma, grade 2, estrogen receptor 10% positive, progesterone receptor and HER-2 negative, with an MIB-1 of 2%  (c) left axillary lymph node biopsy 04/14/2016 showed invasive ductal carcinoma, estrogen and progesterone receptor negative  (d) mammaprint from (2)(c) was also "low risk"  (3) left lumpectomy 05/20/2016 showed a  pT1c pN1 stage IIA  invasive ductal carcinoma, grade 2, estrogen receptor positive, progesterone receptor negative, HER-2 nonamplified, with an MIB-1 of 2%; margins were positive   (4) radiation therapy to follow   (5) tamoxifen started 04/18/2016  (6) genetics consultation being scheduled  PLAN; Sabrina Tapia was initially somewhat distraught regarding the very long and tortuous route that she has had to follow, but she has come to understand that first of all she has "cleared" her right breast, which is far as local treatment is concerned now only needs radiation.  She worked hard to clear the left breast, but I really do not believe that is going to be possible and that was the opinion also of our [facility] when we discussed her case earlier this week. I really think if she has more surgery she'll get more positive margins. The fact that we did not see her left breast cancer in mammography also argues for that surgery.  Once she accepted the fact that she was going to need a left  mastectomy, then the question become what kind of reconstruction does she want and what is the timing of the reconstruction with regards to radiation. These are decisions that I usually leave to the discretion of the surgeon, but we did discuss very generally the different types of mastectomy, the different types of reconstruction, and the effects of radiation on the breast depending on whether the reconstruction is initiated or completed before or after radiation.  She will be meeting with Dr Donne Hazel soon to further clarify these options and to make definitive decisions. I emphasized that she is already on systemic therapy, with tamoxifen, and therefore she can take her time choosing what is best for her   Tentatively she will see me in 5 or 6 weeks, but she has my number and will call or contact me if I can be of further help at any point before that.     :Chauncey Cruel, MD   06/06/2016 5:06 PM Medical Oncology and Hematology [facility] [place], [place] [address] Tel. [phone]    Fax. [phone]

## 2016-06-11 ENCOUNTER — Ambulatory Visit
Admission: RE | Admit: 2016-06-11 | Discharge: 2016-06-11 | Disposition: A | Discharge status: Home / Self Care | Payer: BC Managed Care – PPO | Source: Ambulatory Visit | Attending: Radiation Oncology | Admitting: Radiation Oncology

## 2016-06-11 ENCOUNTER — Ambulatory Visit: Payer: BC Managed Care – PPO | Attending: General Surgery | Admitting: Physical Therapy

## 2016-06-11 ENCOUNTER — Ambulatory Visit: Payer: BC Managed Care – PPO

## 2016-06-11 DIAGNOSIS — Z801 Family history of malignant neoplasm of trachea, bronchus and lung: Secondary | ICD-10-CM | POA: Insufficient documentation

## 2016-06-11 DIAGNOSIS — M8580 Other specified disorders of bone density and structure, unspecified site: Secondary | ICD-10-CM | POA: Insufficient documentation

## 2016-06-11 DIAGNOSIS — M25612 Stiffness of left shoulder, not elsewhere classified: Secondary | ICD-10-CM

## 2016-06-11 DIAGNOSIS — Z7951 Long term (current) use of inhaled steroids: Secondary | ICD-10-CM | POA: Insufficient documentation

## 2016-06-11 DIAGNOSIS — Z803 Family history of malignant neoplasm of breast: Secondary | ICD-10-CM | POA: Insufficient documentation

## 2016-06-11 DIAGNOSIS — Z483 Aftercare following surgery for neoplasm: Secondary | ICD-10-CM

## 2016-06-11 DIAGNOSIS — J45909 Unspecified asthma, uncomplicated: Secondary | ICD-10-CM | POA: Insufficient documentation

## 2016-06-11 DIAGNOSIS — Z79899 Other long term (current) drug therapy: Secondary | ICD-10-CM | POA: Insufficient documentation

## 2016-06-11 DIAGNOSIS — Z510 Encounter for antineoplastic radiation therapy: Secondary | ICD-10-CM | POA: Insufficient documentation

## 2016-06-11 DIAGNOSIS — Z8249 Family history of ischemic heart disease and other diseases of the circulatory system: Secondary | ICD-10-CM | POA: Insufficient documentation

## 2016-06-11 DIAGNOSIS — C50411 Malignant neoplasm of upper-outer quadrant of right female breast: Secondary | ICD-10-CM | POA: Insufficient documentation

## 2016-06-11 DIAGNOSIS — K219 Gastro-esophageal reflux disease without esophagitis: Secondary | ICD-10-CM | POA: Insufficient documentation

## 2016-06-11 DIAGNOSIS — M25512 Pain in left shoulder: Secondary | ICD-10-CM | POA: Diagnosis present

## 2016-06-11 DIAGNOSIS — C50412 Malignant neoplasm of upper-outer quadrant of left female breast: Secondary | ICD-10-CM | POA: Insufficient documentation

## 2016-06-11 DIAGNOSIS — M1990 Unspecified osteoarthritis, unspecified site: Secondary | ICD-10-CM | POA: Insufficient documentation

## 2016-06-11 DIAGNOSIS — Z79810 Long term (current) use of selective estrogen receptor modulators (SERMs): Secondary | ICD-10-CM | POA: Insufficient documentation

## 2016-06-11 DIAGNOSIS — C773 Secondary and unspecified malignant neoplasm of axilla and upper limb lymph nodes: Secondary | ICD-10-CM | POA: Insufficient documentation

## 2016-06-11 DIAGNOSIS — Z170 Estrogen receptor positive status [ER+]: Secondary | ICD-10-CM | POA: Insufficient documentation

## 2016-06-11 DIAGNOSIS — Z87891 Personal history of nicotine dependence: Secondary | ICD-10-CM | POA: Insufficient documentation

## 2016-06-11 DIAGNOSIS — Z9013 Acquired absence of bilateral breasts and nipples: Secondary | ICD-10-CM | POA: Insufficient documentation

## 2016-06-11 NOTE — Therapy (Signed)
[place], [place], [address] Phone: [phone]   Fax:  [phone]  Physical Therapy Evaluation  Patient Details  Name: Sabrina Tapia MRN: [id number] Date of Birth: Sep 26, 1950 Referring Provider: Dr. Rolm Bookbinder  Encounter Date: 06/11/2016      PT End of Session - 06/11/16 2050    Visit Number 1   Number of Visits 9   Date for PT Re-Evaluation 07/11/16   PT Start Time 0849   PT Stop Time 0935   PT Time Calculation (min) 46 min   Activity Tolerance Patient tolerated treatment well   Behavior During Therapy [facility] for tasks assessed/performed      Past Medical History:  Diagnosis Date  . Anxiety   . Arthritis   . Asthma    allergy induced  . Cancer ([place]) 2017   bil breast cancer  . Cervical polyp   . Detrusor instability   . GERD (gastroesophageal reflux disease)   . Heart burn   . Hemorrhoids   . Hypertonicity of bladder   . Joint pain   . LGSIL (low grade squamous intraepithelial dysplasia) 2001   colpo biopsy negative, negative paps since  . Osteopenia 02/2016   T score -1.3 FRAX 8.3%/0.7% stable from prior DEXA    Past Surgical History:  Procedure Laterality Date  . INCISION AND DRAINAGE Left 06/02/2016   Procedure: DRAINAGE of left axilla seroma;  Surgeon: Rolm Bookbinder, MD;  Location: [place];  Service: General;  Laterality: Left;  . RADIOACTIVE SEED GUIDED MASTECTOMY WITH AXILLARY SENTINEL LYMPH NODE BIOPSY Right 03/26/2016   Procedure: RIGHT BREAST LUMPECTOMY WITH RADIOACTIVE SEED AND SENTINEL LYMPH NODE BIOPSY;  Surgeon: Rolm Bookbinder, MD;  Location: [place];  Service: General;  Laterality: Right;  . RADIOACTIVE SEED GUIDED MASTECTOMY WITH AXILLARY SENTINEL LYMPH NODE BIOPSY Left 05/20/2016   Procedure: LEFT BREAST RADIOACTIVE SEED (two seeds) GUIDED LUMPECTOMY WITH LEFT AXILLARY SENTINEL LYMPH NODE BIOPSY AND LEFT SEED TARGETED  AXILLARY LYMPH NODE EXCISION;  Surgeon: Rolm Bookbinder, MD;  Location: [place];  Service: General;  Laterality: Left;  . RE-EXCISION OF BREAST CANCER,SUPERIOR MARGINS Right 05/20/2016   Procedure: RE-EXCISION OF RIGHT BREAST MARGIN;  Surgeon: Rolm Bookbinder, MD;  Location: [place];  Service: General;  Laterality: Right;  . RE-EXCISION OF BREAST CANCER,SUPERIOR MARGINS Left 06/02/2016   Procedure: RE-EXCISION OF BREAST CANCER,SUPERIOR MARGINS;  Surgeon: Rolm Bookbinder, MD;  Location: [place];  Service: General;  Laterality: Left;    There were no vitals filed for this visit.       Subjective Assessment - 06/11/16 0855    Subjective Can't use left arm normally because of pain. right arm is doing well.   Pertinent History Diagnosed 03/04/16; first surgery end of August for right breast lumpectomy but needed re-excision; left breast had two tumors and one irregular lymph node.  Had left breast lumpectomies 05/20/16 but margins weren't clear; had re-excision to get clear margins and "scattered cancer cells" were found (06/02/16), so she expects to have a left mastectomy, which hasn't been scheduled.  Plans to have reconstruction.  One lymph node removed on right and it had microscopic cancer cells; 4 removed on left, 2 positive, so she expects to have radiation to both areas.  May have mastectomy and expander placement in December, then radiation early next year.  This depends on decision at meeting with plastic surgeon later today (06/11/16).  No chemo is  planned.  Has GI issues, overactive bladder.   Patient Stated Goals get rid of left arm pain and use it better   Currently in Pain? Yes   Pain Score 3    Pain Location Axilla   Pain Orientation Left   Pain Descriptors / Indicators Other (Comment)  hot poker   Aggravating Factors  worse with reaching   Pain Relieving Factors rest            [facility] PT Assessment - 06/11/16 0001       Assessment   Medical Diagnosis bilateral breast cancer with lumpectomies, but anticipated mastectomy on left   Referring Provider Dr. Rolm Bookbinder   Onset Date/Surgical Date 06/02/16  8/30 and 05/20/16 first   Hand Dominance Right   Prior Therapy no     Precautions   Precautions Other (comment)   Precaution Comments cancer precautions     Restrictions   Weight Bearing Restrictions No     Balance Screen   Has the patient fallen in the past 6 months No   Has the patient had a decrease in activity level because of a fear of falling?  No   Is the patient reluctant to leave their home because of a fear of falling?  No     Home Environment   Living Environment Private residence   Living Arrangements Alone   Type of Home Other(Comment)  townhome   Home Layout Two level  doesn't need to go upstairs     Prior Function   Level of Independence Independent   Vocation Retired   Leisure plays golf 3-4x/week; does Pilates, yoga, and cardio each once a week normally     Cognition   Overall Cognitive Status Within Functional Limits for tasks assessed     Observation/Other Assessments   Skin Integrity one incision at right upper outer breast, well healed; two incisions on left, one axillary node incision still with a small scab and lateral breast incision still with steri strips in place.  Pt. has some redness at left lateral breast and very slight redness at right flank, which she reports are from the binder she was wearing, so she has switched to a bra.   Quick DASH  55     ROM / Strength   AROM / PROM / Strength AROM;PROM;Strength     AROM   AROM Assessment Site Shoulder   Right/Left Shoulder Right;Left   Right Shoulder Flexion 141 Degrees  feels pulling   Right Shoulder ABduction 154 Degrees  feels pulling   Right Shoulder Internal Rotation --  WFL, supine   Right Shoulder External Rotation --  WFL, supine   Left Shoulder Flexion 115 Degrees   Left Shoulder ABduction 91  Degrees   Left Shoulder Internal Rotation --  WFL, supine   Left Shoulder External Rotation 80 Degrees  wupine     PROM   PROM Assessment Site Shoulder   Right/Left Shoulder Left   Left Shoulder Flexion 130 Degrees   Left Shoulder ABduction 99 Degrees     Strength   Overall Strength Comments left shoulder grossly 3-/5     Ambulation/Gait   Ambulation/Gait Yes   Ambulation/Gait Assistance 7: Independent           LYMPHEDEMA/ONCOLOGY QUESTIONNAIRE - 06/11/16 0921      Lymphedema Assessments   Lymphedema Assessments Upper extremities     Right Upper Extremity Lymphedema   10 cm Proximal to Olecranon Process 24.7 cm   Olecranon Process 22.8 cm  10 cm Proximal to Ulnar Styloid Process 19.1 cm   Just Proximal to Ulnar Styloid Process 14.2 cm   Across Hand at [employer] 18.5 cm   At [place] of 2nd Digit 5.7 cm     Left Upper Extremity Lymphedema   10 cm Proximal to Olecranon Process 24 cm   Olecranon Process 22.2 cm   10 cm Proximal to Ulnar Styloid Process 17.7 cm   Just Proximal to Ulnar Styloid Process 13.4 cm   Across Hand at [employer] 17.6 cm   At [place] of 2nd Digit 5.6 cm           Quick Dash - 06/11/16 0001    Open a tight or new jar Moderate difficulty   Do heavy household chores (wash walls, wash floors) Severe difficulty   Carry a shopping bag or briefcase Mild difficulty   Wash your back Severe difficulty   Use a knife to cut food No difficulty   Recreational activities in which you take some force or impact through your arm, shoulder, or hand (golf, hammering, tennis) Unable   During the past week, to what extent has your arm, shoulder or hand problem interfered with your normal social activities with family, friends, neighbors, or groups? Quite a bit   During the past week, to what extent has your arm, shoulder or hand problem limited your work or other regular daily activities Quite a bit   Arm, shoulder, or hand pain. Severe   Tingling (pins  and needles) in your arm, shoulder, or hand None   Difficulty Sleeping Moderate difficulty   DASH Score 54.55 %             [facility] Adult PT Treatment/Exercise - 06/11/16 0001      Exercises   Exercises Other Exercises   Other Exercises  BMDC shoulder ROM exercises instructed and performed by patient x 2-3 each                PT Education - 06/11/16 2049    Education provided Yes   Education Details breast multidisciplinary clinic shoulder ROM exercises   Person(s) Educated Patient   Methods Explanation;Demonstration;Verbal cues;Handout   Comprehension Verbalized understanding;Returned demonstration                [facility] Goals - 06/11/16 2101      CC Long Term Goal  #1   Title left shoulder active flexion 140 degrees for improved overhead reach   Baseline 115 at eval   Time 4   Period Weeks   Status New     CC Long Term Goal  #2   Title left shoulder active abduction 140 degrees for improved ADLs   Baseline 91 at evak   Time 4   Period Weeks   Status New     CC Long Term Goal  #3   Title pain decreased at least 50% when reaching with left arm   Time 4   Period Weeks   Status New     CC Long Term Goal  #4   Title quick DASH score reduced to 20 or less   Baseline 55   Time 4   Period Weeks   Status New            Plan - 06/11/16 2050    Clinical Impression Statement Patient with bilateral breast cancer and lumpectomies and re-excisions both sides as well as SLNBs; pt. to have mastectomy on left because of  finding of cancer cells.  She is not yet sure the date for this.  She has functional AROM in right shoulder, but left is limited as well as weak.  Eval level is moderate because of patient's multiple surgeries prolonging her recovery and evolving because another surgery and other treatment isplanned.  She lives alone and has a high DASH score indicating decreased independence.   Rehab Potential Excellent   Clinical Impairments  Affecting Rehab Potential upcoming surgery   PT Frequency 2x / week   PT Duration 4 weeks   PT Treatment/Interventions ADLs/Self Care Home Management;Therapeutic exercise;Patient/family education;Manual techniques;Scar mobilization;Passive range of motion   PT Next Visit Plan Lt. shoulder P/AA/A/ROM, begin education about lymphedema risk reduction, HEP progression, strengthening.   PT Home Exercise Plan BMDC exercises   Consulted and Agree with Plan of Care Patient      Patient will benefit from skilled therapeutic intervention in order to improve the following deficits and impairments:  Decreased knowledge of precautions, Decreased knowledge of use of DME, Decreased range of motion, Decreased strength, Impaired UE functional use, Pain  Visit Diagnosis: Stiffness of left shoulder, not elsewhere classified - Plan: PT plan of care cert/re-cert  Acute pain of left shoulder - Plan: PT plan of care cert/re-cert  Aftercare following surgery for neoplasm - Plan: PT plan of care cert/re-cert     Problem List Patient Active Problem List   Diagnosis Date Noted  . Breast cancer of upper-outer quadrant of left female breast ([place]) 04/19/2016  . Breast cancer of upper-outer quadrant of right female breast ([place]) 03/31/2016  . Acid reflux 11/20/2015  . Bunion of great toe of right foot 04/04/2015  . Hemorrhoid 11/23/2014  . Anal skin tag 11/23/2014  . Anal burning 11/23/2014  . Plantar fasciitis, right 05/11/2014  . Retrocalcaneal bursitis 05/11/2014  . Pain in joint, ankle and foot 05/11/2014  . Heart burn   . Joint pain   . Abnormal Pap smear   . Osteopenia     SALISBURY,DONNA 06/11/2016, 9:07 PM  [place], [place], [address] Phone: [phone]   Fax:  [phone]  Name: Sabrina Tapia MRN: [id number] Date of Birth: 1951/02/15  Serafina Royals, PT 06/11/16 9:08 PM

## 2016-06-12 ENCOUNTER — Ambulatory Visit: Payer: BC Managed Care – PPO

## 2016-06-12 DIAGNOSIS — M25612 Stiffness of left shoulder, not elsewhere classified: Secondary | ICD-10-CM

## 2016-06-12 DIAGNOSIS — M25512 Pain in left shoulder: Secondary | ICD-10-CM

## 2016-06-12 DIAGNOSIS — Z483 Aftercare following surgery for neoplasm: Secondary | ICD-10-CM

## 2016-06-12 NOTE — Therapy (Signed)
[place], [place], [address] Phone: [phone]   Fax:  [phone]  Physical Therapy Treatment  Patient Details  Name: Sabrina Tapia MRN: [id number] Date of Birth: Jul 27, 1951 Referring Provider: Dr. Rolm Bookbinder  Encounter Date: 06/12/2016      PT End of Session - 06/12/16 1107    Visit Number 2   Number of Visits 9   Date for PT Re-Evaluation 07/11/16   PT Start Time 1026   PT Stop Time 1107   PT Time Calculation (min) 41 min   Activity Tolerance Patient tolerated treatment well   Behavior During Therapy [facility] for tasks assessed/performed      Past Medical History:  Diagnosis Date  . Anxiety   . Arthritis   . Asthma    allergy induced  . Cancer ([place]) 2017   bil breast cancer  . Cervical polyp   . Detrusor instability   . GERD (gastroesophageal reflux disease)   . Heart burn   . Hemorrhoids   . Hypertonicity of bladder   . Joint pain   . LGSIL (low grade squamous intraepithelial dysplasia) 2001   colpo biopsy negative, negative paps since  . Osteopenia 02/2016   T score -1.3 FRAX 8.3%/0.7% stable from prior DEXA    Past Surgical History:  Procedure Laterality Date  . INCISION AND DRAINAGE Left 06/02/2016   Procedure: DRAINAGE of left axilla seroma;  Surgeon: Rolm Bookbinder, MD;  Location: [place];  Service: General;  Laterality: Left;  . RADIOACTIVE SEED GUIDED MASTECTOMY WITH AXILLARY SENTINEL LYMPH NODE BIOPSY Right 03/26/2016   Procedure: RIGHT BREAST LUMPECTOMY WITH RADIOACTIVE SEED AND SENTINEL LYMPH NODE BIOPSY;  Surgeon: Rolm Bookbinder, MD;  Location: [place];  Service: General;  Laterality: Right;  . RADIOACTIVE SEED GUIDED MASTECTOMY WITH AXILLARY SENTINEL LYMPH NODE BIOPSY Left 05/20/2016   Procedure: LEFT BREAST RADIOACTIVE SEED (two seeds) GUIDED LUMPECTOMY WITH LEFT AXILLARY SENTINEL LYMPH NODE BIOPSY AND LEFT SEED TARGETED  AXILLARY LYMPH NODE EXCISION;  Surgeon: Rolm Bookbinder, MD;  Location: [place];  Service: General;  Laterality: Left;  . RE-EXCISION OF BREAST CANCER,SUPERIOR MARGINS Right 05/20/2016   Procedure: RE-EXCISION OF RIGHT BREAST MARGIN;  Surgeon: Rolm Bookbinder, MD;  Location: [place];  Service: General;  Laterality: Right;  . RE-EXCISION OF BREAST CANCER,SUPERIOR MARGINS Left 06/02/2016   Procedure: RE-EXCISION OF BREAST CANCER,SUPERIOR MARGINS;  Surgeon: Rolm Bookbinder, MD;  Location: [place];  Service: General;  Laterality: Left;    There were no vitals filed for this visit.      Subjective Assessment - 06/12/16 1028    Subjective I did the exercises last night that Butch Penny gave me yesterday and those went good.    Pertinent History Diagnosed 03/04/16; first surgery end of August for right breast lumpectomy but needed re-excision; left breast had two tumors and one irregular lymph node.  Had left breast lumpectomies 05/20/16 but margins weren't clear; had re-excision to get clear margins and "scattered cancer cells" were found (06/02/16), so she expects to have a left mastectomy, which hasn't been scheduled.  Plans to have reconstruction.  One lymph node removed on right and it had microscopic cancer cells; 4 removed on left, 2 positive, so she expects to have radiation to both areas.  May have mastectomy and expander placement in December, then radiation early next year.  This depends on decision at meeting with plastic surgeon later today (06/11/16).  No  chemo is planned.  Has GI issues, overactive bladder.   Patient Stated Goals get rid of left arm pain and use it better   Currently in Pain? No/denies   Pain Score 3    Pain Location Axilla   Pain Orientation Left   Pain Descriptors / Indicators Sore   Pain Type Surgical pain   Aggravating Factors  worse with reaching   Pain Relieving Factors rest                LYMPHEDEMA/ONCOLOGY QUESTIONNAIRE - 06/11/16 0921      Lymphedema Assessments   Lymphedema Assessments Upper extremities     Right Upper Extremity Lymphedema   10 cm Proximal to Olecranon Process 24.7 cm   Olecranon Process 22.8 cm   10 cm Proximal to Ulnar Styloid Process 19.1 cm   Just Proximal to Ulnar Styloid Process 14.2 cm   Across Hand at [employer] 18.5 cm   At [name] of 2nd Digit 5.7 cm     Left Upper Extremity Lymphedema   10 cm Proximal to Olecranon Process 24 cm   Olecranon Process 22.2 cm   10 cm Proximal to Ulnar Styloid Process 17.7 cm   Just Proximal to Ulnar Styloid Process 13.4 cm   Across Hand at [employer] 17.6 cm   At [place] of 2nd Digit 5.6 cm                  [facility] Adult PT Treatment/Exercise - 06/12/16 0001      Shoulder Exercises: Pulleys   Flexion 3 minutes   ABduction 3 minutes     Shoulder Exercises: Therapy Ball   Flexion 10 reps  Forward lean into top of stretch     Shoulder Exercises: Stretch   Corner Stretch 5 reps;10 seconds   Corner Stretch Limitations Tactile cuing to decrease scapular compensations   Other Shoulder Stretches Finger Ladder for Lt UE abduction 5 times (up to 21)     Manual Therapy   Manual Therapy Neural Stretch   Myofascial Release To Lt axilla and chest during P/ROM   Passive ROM In Supine to Lt shoulder into flexion, abduction and er to pts tolerance.   Neural Stretch To LT UE during P/ROM                PT Education - 06/11/16 2049    Education provided Yes   Education Details breast multidisciplinary clinic shoulder ROM exercises   Person(s) Educated Patient   Methods Explanation;Demonstration;Verbal cues;Handout   Comprehension Verbalized understanding;Returned demonstration                [facility] Goals - 06/11/16 2101      CC Long Term Goal  #1   Title left shoulder active flexion 140 degrees for improved overhead reach   Baseline 115 at eval   Time 4    Period Weeks   Status New     CC Long Term Goal  #2   Title left shoulder active abduction 140 degrees for improved ADLs   Baseline 91 at evak   Time 4   Period Weeks   Status New     CC Long Term Goal  #3   Title pain decreased at least 50% when reaching with left arm   Time 4   Period Weeks   Status New     CC Long Term Goal  #4   Title quick DASH score reduced to 20 or less  Baseline 55   Time 4   Period Weeks   Status New            Plan - 06/12/16 1201    Clinical Impression Statement Pt tolerated stertching very well today, reporting she did better than she thought she would and felt looser after session today as well. She had already done the HEP issued yesterday once last night and reported no problems with that. She is very tight with P/ROM, but more so neural tension seems to be limiting her motion than muscular restrictions so included neural stretching as well with P/ROM. She was also very sensitive to myofascial release at her axilla reporting it feeling very tight/tender.    Rehab Potential Excellent   Clinical Impairments Affecting Rehab Potential upcoming surgery   PT Frequency 2x / week   PT Duration 4 weeks   PT Treatment/Interventions ADLs/Self Care Home Management;Therapeutic exercise;Patient/family education;Manual techniques;Scar mobilization;Passive range of motion   PT Next Visit Plan Cont Lt. shoulder P/AA/A/ROM, add neural tension stretch to HEP and have pt perform; begin education about lymphedema risk reduction, HEP progression, strengthening.   Consulted and Agree with Plan of Care Patient      Patient will benefit from skilled therapeutic intervention in order to improve the following deficits and impairments:  Decreased knowledge of precautions, Decreased knowledge of use of DME, Decreased range of motion, Decreased strength, Impaired UE functional use, Pain  Visit Diagnosis: Stiffness of left shoulder, not elsewhere classified  Acute pain  of left shoulder  Aftercare following surgery for neoplasm     Problem List Patient Active Problem List   Diagnosis Date Noted  . Breast cancer of upper-outer quadrant of left female breast ([place]) 04/19/2016  . Breast cancer of upper-outer quadrant of right female breast ([place]) 03/31/2016  . Acid reflux 11/20/2015  . Bunion of great toe of right foot 04/04/2015  . Hemorrhoid 11/23/2014  . Anal skin tag 11/23/2014  . Anal burning 11/23/2014  . Plantar fasciitis, right 05/11/2014  . Retrocalcaneal bursitis 05/11/2014  . Pain in joint, ankle and foot 05/11/2014  . Heart burn   . Joint pain   . Abnormal Pap smear   . Osteopenia     Otelia Limes, PTA 06/12/2016, 12:05 PM  [place], [place], [address] Phone: [phone]   Fax:  [phone]  Name: DONETA ANNUNZIATA MRN: [id number] Date of Birth: 01/25/1951

## 2016-06-13 ENCOUNTER — Encounter: Payer: Self-pay | Admitting: *Deleted

## 2016-06-13 ENCOUNTER — Ambulatory Visit (INDEPENDENT_AMBULATORY_CARE_PROVIDER_SITE_OTHER): Payer: BC Managed Care – PPO | Admitting: Psychology

## 2016-06-13 ENCOUNTER — Telehealth: Payer: Self-pay | Admitting: Oncology

## 2016-06-13 DIAGNOSIS — F332 Major depressive disorder, recurrent severe without psychotic features: Secondary | ICD-10-CM

## 2016-06-13 NOTE — Telephone Encounter (Signed)
SPOKE WITH PATIENT RE F/U 12/21. F/U 11/30 CXD - PATIENT AWARE.

## 2016-06-17 ENCOUNTER — Ambulatory Visit: Payer: BC Managed Care – PPO

## 2016-06-17 DIAGNOSIS — M25612 Stiffness of left shoulder, not elsewhere classified: Secondary | ICD-10-CM | POA: Diagnosis not present

## 2016-06-17 DIAGNOSIS — M25512 Pain in left shoulder: Secondary | ICD-10-CM

## 2016-06-17 DIAGNOSIS — Z483 Aftercare following surgery for neoplasm: Secondary | ICD-10-CM

## 2016-06-17 NOTE — Therapy (Signed)
[place], [place], [address] Phone: [phone]   Fax:  [phone]  Physical Therapy Treatment  Patient Details  Name: Sabrina Tapia MRN: [id number] Date of Birth: 22-Mar-1951 Referring Provider: Dr. Rolm Bookbinder  Encounter Date: 06/17/2016      PT End of Session - 06/17/16 1107    Visit Number 3   Number of Visits 9   Date for PT Re-Evaluation 07/11/16   PT Start Time 1023   PT Stop Time 1107   PT Time Calculation (min) 44 min   Activity Tolerance Patient tolerated treatment well   Behavior During Therapy [facility] for tasks assessed/performed      Past Medical History:  Diagnosis Date  . Anxiety   . Arthritis   . Asthma    allergy induced  . Cancer ([place]) 2017   bil breast cancer  . Cervical polyp   . Detrusor instability   . GERD (gastroesophageal reflux disease)   . Heart burn   . Hemorrhoids   . Hypertonicity of bladder   . Joint pain   . LGSIL (low grade squamous intraepithelial dysplasia) 2001   colpo biopsy negative, negative paps since  . Osteopenia 02/2016   T score -1.3 FRAX 8.3%/0.7% stable from prior DEXA    Past Surgical History:  Procedure Laterality Date  . INCISION AND DRAINAGE Left 06/02/2016   Procedure: DRAINAGE of left axilla seroma;  Surgeon: Rolm Bookbinder, MD;  Location: [place];  Service: General;  Laterality: Left;  . RADIOACTIVE SEED GUIDED MASTECTOMY WITH AXILLARY SENTINEL LYMPH NODE BIOPSY Right 03/26/2016   Procedure: RIGHT BREAST LUMPECTOMY WITH RADIOACTIVE SEED AND SENTINEL LYMPH NODE BIOPSY;  Surgeon: Rolm Bookbinder, MD;  Location: [place];  Service: General;  Laterality: Right;  . RADIOACTIVE SEED GUIDED MASTECTOMY WITH AXILLARY SENTINEL LYMPH NODE BIOPSY Left 05/20/2016   Procedure: LEFT BREAST RADIOACTIVE SEED (two seeds) GUIDED LUMPECTOMY WITH LEFT AXILLARY SENTINEL LYMPH NODE BIOPSY AND LEFT SEED TARGETED  AXILLARY LYMPH NODE EXCISION;  Surgeon: Rolm Bookbinder, MD;  Location: [place];  Service: General;  Laterality: Left;  . RE-EXCISION OF BREAST CANCER,SUPERIOR MARGINS Right 05/20/2016   Procedure: RE-EXCISION OF RIGHT BREAST MARGIN;  Surgeon: Rolm Bookbinder, MD;  Location: [place];  Service: General;  Laterality: Right;  . RE-EXCISION OF BREAST CANCER,SUPERIOR MARGINS Left 06/02/2016   Procedure: RE-EXCISION OF BREAST CANCER,SUPERIOR MARGINS;  Surgeon: Rolm Bookbinder, MD;  Location: [place];  Service: General;  Laterality: Left;    There were no vitals filed for this visit.      Subjective Assessment - 06/17/16 1025    Subjective Continuing to do the HEP and I can tell an improvement though of course not as quick as I'd like. [place] [employer] yesterday and got a great bra and it feels so good on my incision. I have an appt with Dr. Donne Hazel this afternoon but not sure what about, his office called and said he wanted to see me, probably about my upcoming masectomy though.    Pertinent History Diagnosed 03/04/16; first surgery end of August for right breast lumpectomy but needed re-excision; left breast had two tumors and one irregular lymph node.  Had left breast lumpectomies 05/20/16 but margins weren't clear; had re-excision to get clear margins and "scattered cancer cells" were found (06/02/16), so she expects to have a left mastectomy, which hasn't been scheduled.  Plans to have reconstruction.  One lymph  node removed on right and it had microscopic cancer cells; 4 removed on left, 2 positive, so she expects to have radiation to both areas.  May have mastectomy and expander placement in December, then radiation early next year.  This depends on decision at meeting with plastic surgeon later today (06/11/16).  No chemo is planned.  Has GI issues, overactive bladder.   Patient Stated Goals get rid of left arm pain and use it better    Currently in Pain? No/denies            [facility] PT Assessment - 06/17/16 0001      AROM   Left Shoulder Flexion 131 Degrees   Left Shoulder ABduction 115 Degrees                     [facility] Adult PT Treatment/Exercise - 06/17/16 0001      Shoulder Exercises: Pulleys   Flexion 2 minutes   Flexion Limitations VC to relax upper traps   ABduction 2 minutes   ABduction Limitations VC to relax upper traps     Shoulder Exercises: Therapy Ball   Flexion 10 reps  Forward lean into top of stretch   ABduction 10 reps  Lt UE with side lean into top of stretch     Shoulder Exercises: Stretch   Corner Stretch 5 reps;10 seconds   Corner Stretch Limitations Pt did better with relaxing shoulders today     Manual Therapy   Manual Therapy Neural Stretch;Myofascial release;Passive ROM   Myofascial Release To Lt axilla and chest during P/ROM instructing pt in this during   Passive ROM In Supine to Lt shoulder into flexion, abduction and er to pts tolerance.   Neural Stretch To LT UE during P/ROM                        [facility] Goals - 06/17/16 1044      CC Long Term Goal  #1   Title left shoulder active flexion 140 degrees for improved overhead reach   Baseline 115 at eval; 131 degrees 06/17/16   Status On-going     CC Long Term Goal  #2   Title left shoulder active abduction 140 degrees for improved ADLs   Baseline 91 at eval; 115 degrees 06/17/16   Status On-going     CC Long Term Goal  #3   Title pain decreased at least 50% when reaching with left arm   Baseline 75-80% improvement reported at this time 06/17/16   Status Achieved     CC Long Term Goal  #4   Title quick DASH score reduced to 20 or less   Status On-going            Plan - 06/17/16 1107    Clinical Impression Statement Pt has made great improvements since evaluation with her ROM measurements progressing well towards both goals and reports of pain/pulling in UE with  reaching meeting that goal. Pt did need encouragemewnt that she was progessing at a good rate but felt better after seeing her improvement when compared to last week. Also continued to encourage pt to work on myofascial release to further densesitize her Lt axilla.    Rehab Potential Excellent   Clinical Impairments Affecting Rehab Potential upcoming surgery   PT Frequency 2x / week   PT Duration 4 weeks   PT Treatment/Interventions ADLs/Self Care Home Management;Therapeutic exercise;Patient/family education;Manual techniques;Scar mobilization;Passive range of motion  PT Next Visit Plan Cont Lt. shoulder P/AA/A/ROM, add neural tension stretch to HEP and have pt perform; begin education about lymphedema risk reduction, HEP progression, strengthening.   Consulted and Agree with Plan of Care Patient      Patient will benefit from skilled therapeutic intervention in order to improve the following deficits and impairments:  Decreased knowledge of precautions, Decreased knowledge of use of DME, Decreased range of motion, Decreased strength, Impaired UE functional use, Pain  Visit Diagnosis: Stiffness of left shoulder, not elsewhere classified  Acute pain of left shoulder  Aftercare following surgery for neoplasm     Problem List Patient Active Problem List   Diagnosis Date Noted  . Breast cancer of upper-outer quadrant of left female breast ([place]) 04/19/2016  . Breast cancer of upper-outer quadrant of right female breast ([place]) 03/31/2016  . Acid reflux 11/20/2015  . Bunion of great toe of right foot 04/04/2015  . Hemorrhoid 11/23/2014  . Anal skin tag 11/23/2014  . Anal burning 11/23/2014  . Plantar fasciitis, right 05/11/2014  . Retrocalcaneal bursitis 05/11/2014  . Pain in joint, ankle and foot 05/11/2014  . Heart burn   . Joint pain   . Abnormal Pap smear   . Osteopenia     Golda Acre 06/17/2016, 11:11 AM  [place], [place], [address] Phone: [phone]   Fax:  [phone]  Name: Sabrina Tapia MRN: [id number] Date of Birth: 1950-10-04

## 2016-06-23 ENCOUNTER — Ambulatory Visit: Payer: BC Managed Care – PPO | Admitting: Physical Therapy

## 2016-06-23 ENCOUNTER — Other Ambulatory Visit: Payer: Self-pay | Admitting: General Surgery

## 2016-06-23 DIAGNOSIS — M25512 Pain in left shoulder: Secondary | ICD-10-CM

## 2016-06-23 DIAGNOSIS — Z483 Aftercare following surgery for neoplasm: Secondary | ICD-10-CM

## 2016-06-23 DIAGNOSIS — M25612 Stiffness of left shoulder, not elsewhere classified: Secondary | ICD-10-CM | POA: Diagnosis not present

## 2016-06-23 NOTE — Patient Instructions (Addendum)
Stand with left side toward wall.  Place left hand on wall (arm out to the side and up a bit) so that you feel a stretch.  Bend elbow slightly, then straighten it, fairly quickly, 30 times. Do once or twice a day.  Flexors Stick Stretch I    Stand or sit, dowel in palm of arm to be stretched. Other arm, holding dowel at side and behind body, pushes arm being stretched forward and upward until straight over head. Hold __10_ seconds. Repeat _5__ times per session. Do _2__ sessions per day.  Copyright  VHI. All rights reserved.  Inferior Capsule Stick Stretch I    Stand or sit, dowel in palm of arm to be stretched. Other arm, holding dowel in front of body, pushes outward and upward until arm being stretched is as high to the side as possible. Hold _10__ seconds. Repeat _5__ times per session. Do __2_ sessions per day.  Copyright  VHI. All rights reserved.    Roll up a bath towel and lie on it on your back on the bed (toward the left edge of the bed would be good).  The towel roll should be right along your spine. 1) Take both arms straight out to the side and hold where you stretch for 30 seconds. 2) Take both arms up and out in a "Y" position, hold where you stretch for 10 seconds and do 5 repetitions.   Do these 2x/day.

## 2016-06-23 NOTE — Therapy (Signed)
[place], [place], [address] Phone: [phone]   Fax:  [phone]  Physical Therapy Treatment  Patient Details  Name: Sabrina Tapia MRN: [id number] Date of Birth: 11-Jun-1951 Referring Provider: Dr. Rolm Bookbinder  Encounter Date: 06/23/2016      PT End of Session - 06/23/16 2049    Visit Number 4   Number of Visits 9   Date for PT Re-Evaluation 07/11/16   PT Start Time [id number]   PT Stop Time 1603   PT Time Calculation (min) 48 min   Activity Tolerance Patient tolerated treatment well   Behavior During Therapy [facility] for tasks assessed/performed      Past Medical History:  Diagnosis Date  . Anxiety   . Arthritis   . Asthma    allergy induced  . Cancer ([place]) 2017   bil breast cancer  . Cervical polyp   . Detrusor instability   . GERD (gastroesophageal reflux disease)   . Heart burn   . Hemorrhoids   . Hypertonicity of bladder   . Joint pain   . LGSIL (low grade squamous intraepithelial dysplasia) 2001   colpo biopsy negative, negative paps since  . Osteopenia 02/2016   T score -1.3 FRAX 8.3%/0.7% stable from prior DEXA    Past Surgical History:  Procedure Laterality Date  . INCISION AND DRAINAGE Left 06/02/2016   Procedure: DRAINAGE of left axilla seroma;  Surgeon: Rolm Bookbinder, MD;  Location: [place];  Service: General;  Laterality: Left;  . RADIOACTIVE SEED GUIDED MASTECTOMY WITH AXILLARY SENTINEL LYMPH NODE BIOPSY Right 03/26/2016   Procedure: RIGHT BREAST LUMPECTOMY WITH RADIOACTIVE SEED AND SENTINEL LYMPH NODE BIOPSY;  Surgeon: Rolm Bookbinder, MD;  Location: [place];  Service: General;  Laterality: Right;  . RADIOACTIVE SEED GUIDED MASTECTOMY WITH AXILLARY SENTINEL LYMPH NODE BIOPSY Left 05/20/2016   Procedure: LEFT BREAST RADIOACTIVE SEED (two seeds) GUIDED LUMPECTOMY WITH LEFT AXILLARY SENTINEL LYMPH NODE BIOPSY AND LEFT SEED TARGETED  AXILLARY LYMPH NODE EXCISION;  Surgeon: Rolm Bookbinder, MD;  Location: [place];  Service: General;  Laterality: Left;  . RE-EXCISION OF BREAST CANCER,SUPERIOR MARGINS Right 05/20/2016   Procedure: RE-EXCISION OF RIGHT BREAST MARGIN;  Surgeon: Rolm Bookbinder, MD;  Location: [place];  Service: General;  Laterality: Right;  . RE-EXCISION OF BREAST CANCER,SUPERIOR MARGINS Left 06/02/2016   Procedure: RE-EXCISION OF BREAST CANCER,SUPERIOR MARGINS;  Surgeon: Rolm Bookbinder, MD;  Location: [place];  Service: General;  Laterality: Left;    There were no vitals filed for this visit.      Subjective Assessment - 06/23/16 1516    Subjective Just in the last couple days it's gotten a lot better.  Can move it better and not getting a hot poker feeling now.  Has asked to be put on surgery schedule next week for mastectomy with immediate expander placement.   Currently in Pain? No/denies                         [facility] Adult PT Treatment/Exercise - 06/23/16 0001      Exercises   Exercises --     Shoulder Exercises: Supine   Horizontal ABduction --  over towel roll x 30 seconds   Other Supine Exercises over towel roll, "Y" 10 seconds x 5     Shoulder Exercises: Standing   Other Standing Exercises Tried neural tension stretch with left hand on wall;  patient with stretch at wrist and chest when arm was in horizontal abduction, abduction.     Shoulder Exercises: Pulleys   Flexion 2 minutes   ABduction 2 minutes     Shoulder Exercises: Therapy Ball   Flexion 10 reps  Forward lean into top of stretch   ABduction 10 reps  Lt UE with side lean into top of stretch     Manual Therapy   Myofascial Release Left UE myofascial pulling in supine   Passive ROM In supine to left shoulder into abduction and flexion.                PT Education - 06/23/16 2048    Education provided Yes   Education Details left UE neural  tension stretch, dowel stretches, supine over foam roll stretches   Person(s) Educated Patient   Methods Explanation;Demonstration;Verbal cues;Handout   Comprehension Verbalized understanding;Returned demonstration                Long Term Clinic Goals - 06/17/16 1044      CC Long Term Goal  #1   Title left shoulder active flexion 140 degrees for improved overhead reach   Baseline 115 at eval; 131 degrees 06/17/16   Status On-going     CC Long Term Goal  #2   Title left shoulder active abduction 140 degrees for improved ADLs   Baseline 91 at eval; 115 degrees 06/17/16   Status On-going     CC Long Term Goal  #3   Title pain decreased at least 50% when reaching with left arm   Baseline 75-80% improvement reported at this time 06/17/16   Status Achieved     CC Long Term Goal  #4   Title quick DASH score reduced to 20 or less   Status On-going            Plan - 06/23/16 2049    Clinical Impression Statement Added to HEP today; patient notes significant improvement since starting therapy.   Rehab Potential Excellent   Clinical Impairments Affecting Rehab Potential upcoming surgery   PT Frequency 2x / week   PT Duration 4 weeks   PT Treatment/Interventions ADLs/Self Care Home Management;Therapeutic exercise;Patient/family education;Manual techniques;Scar mobilization;Passive range of motion   PT Next Visit Plan Remeasure and reassess goals.  Cont Lt. shoulder P/AA/A/ROM; begin education about lymphedema risk reduction, strengthening.   PT Home Exercise Plan BMDC exercises or dowel stretches, supine over foam roll, neural tension stretch   Consulted and Agree with Plan of Care Patient      Patient will benefit from skilled therapeutic intervention in order to improve the following deficits and impairments:  Decreased knowledge of precautions, Decreased knowledge of use of DME, Decreased range of motion, Decreased strength, Impaired UE functional use, Pain  Visit  Diagnosis: Stiffness of left shoulder, not elsewhere classified  Acute pain of left shoulder  Aftercare following surgery for neoplasm     Problem List Patient Active Problem List   Diagnosis Date Noted  . Breast cancer of upper-outer quadrant of left female breast ([place]) 04/19/2016  . Breast cancer of upper-outer quadrant of right female breast ([place]) 03/31/2016  . Acid reflux 11/20/2015  . Bunion of great toe of right foot 04/04/2015  . Hemorrhoid 11/23/2014  . Anal skin tag 11/23/2014  . Anal burning 11/23/2014  . Plantar fasciitis, right 05/11/2014  . Retrocalcaneal bursitis 05/11/2014  . Pain in joint, ankle and foot 05/11/2014  . Heart burn   . Joint pain   .  Abnormal Pap smear   . Osteopenia     Kati Riggenbach 06/23/2016, 8:52 PM  [place], [place], [address] Phone: [phone]   Fax:  [phone]  Name: Sabrina Tapia MRN: [id number] Date of Birth: [birth year]  Serafina Royals, PT 06/23/16 8:53 PM

## 2016-06-25 ENCOUNTER — Encounter (HOSPITAL_BASED_OUTPATIENT_CLINIC_OR_DEPARTMENT_OTHER): Payer: Self-pay | Admitting: *Deleted

## 2016-06-25 ENCOUNTER — Ambulatory Visit: Payer: BC Managed Care – PPO | Admitting: Physical Therapy

## 2016-06-25 DIAGNOSIS — M25512 Pain in left shoulder: Secondary | ICD-10-CM

## 2016-06-25 DIAGNOSIS — M25612 Stiffness of left shoulder, not elsewhere classified: Secondary | ICD-10-CM | POA: Diagnosis not present

## 2016-06-25 DIAGNOSIS — Z483 Aftercare following surgery for neoplasm: Secondary | ICD-10-CM

## 2016-06-25 NOTE — Therapy (Signed)
[place], [place], [address] Phone: [phone]   Fax:  [phone]  Physical Therapy Treatment  Patient Details  Name: Sabrina Tapia MRN: [id number] Date of Birth: Mar 10, 1951 Referring Provider: Dr. Rolm Bookbinder  Encounter Date: 06/25/2016      PT End of Session - 06/25/16 1210    Visit Number 5   Number of Visits 9   Date for PT Re-Evaluation 07/11/16   PT Start Time 1021   PT Stop Time 1102   PT Time Calculation (min) 41 min   Activity Tolerance Patient tolerated treatment well   Behavior During Therapy [facility] for tasks assessed/performed      Past Medical History:  Diagnosis Date  . Anxiety   . Arthritis   . Asthma    allergy induced  . Cancer ([place]) 2017   bil breast cancer  . Cervical polyp   . Detrusor instability   . GERD (gastroesophageal reflux disease)   . Heart burn   . Hemorrhoids   . Hypertonicity of bladder   . Joint pain   . LGSIL (low grade squamous intraepithelial dysplasia) 2001   colpo biopsy negative, negative paps since  . Osteopenia 02/2016   T score -1.3 FRAX 8.3%/0.7% stable from prior DEXA    Past Surgical History:  Procedure Laterality Date  . INCISION AND DRAINAGE Left 06/02/2016   Procedure: DRAINAGE of left axilla seroma;  Surgeon: Rolm Bookbinder, MD;  Location: [place];  Service: General;  Laterality: Left;  . RADIOACTIVE SEED GUIDED MASTECTOMY WITH AXILLARY SENTINEL LYMPH NODE BIOPSY Right 03/26/2016   Procedure: RIGHT BREAST LUMPECTOMY WITH RADIOACTIVE SEED AND SENTINEL LYMPH NODE BIOPSY;  Surgeon: Rolm Bookbinder, MD;  Location: [place];  Service: General;  Laterality: Right;  . RADIOACTIVE SEED GUIDED MASTECTOMY WITH AXILLARY SENTINEL LYMPH NODE BIOPSY Left 05/20/2016   Procedure: LEFT BREAST RADIOACTIVE SEED (two seeds) GUIDED LUMPECTOMY WITH LEFT AXILLARY SENTINEL LYMPH NODE BIOPSY AND LEFT SEED TARGETED  AXILLARY LYMPH NODE EXCISION;  Surgeon: Rolm Bookbinder, MD;  Location: [place];  Service: General;  Laterality: Left;  . RE-EXCISION OF BREAST CANCER,SUPERIOR MARGINS Right 05/20/2016   Procedure: RE-EXCISION OF RIGHT BREAST MARGIN;  Surgeon: Rolm Bookbinder, MD;  Location: [place];  Service: General;  Laterality: Right;  . RE-EXCISION OF BREAST CANCER,SUPERIOR MARGINS Left 06/02/2016   Procedure: RE-EXCISION OF BREAST CANCER,SUPERIOR MARGINS;  Surgeon: Rolm Bookbinder, MD;  Location: [place];  Service: General;  Laterality: Left;    There were no vitals filed for this visit.      Subjective Assessment - 06/25/16 1023    Subjective I have my surgery date--next Tuesday.  The dowel stretches do a good stretch job--"I just have to tell myself not to overdo it."  Put arm up behind head this morning and didn't jerk away from that position.   Currently in Pain? No/denies                         [facility] Adult PT Treatment/Exercise - 06/25/16 0001      Shoulder Exercises: Supine   Other Supine Exercises with arms outstretched, lower trunk rotation each direction for upper chest stretch     Shoulder Exercises: Seated   Other Seated Exercises backward shoulder rolls x 10     Shoulder Exercises: Sidelying   Other Sidelying Exercises In right sidelying, D2 with left with hold to stretch x  5     Manual Therapy   Manual Therapy Scapular mobilization   Myofascial Release Left UE myofascial pulling in supine, then to right sidelying with stretch into abduction   Scapular Mobilization In right sidelying for left scapula for protraction and depression.   Passive ROM In supine to left shoulder into abduction and flexion; horizontal abduction as well.   Neural Stretch Lt. UE in supine                PT Education - 06/25/16 1209    Education provided Yes   Education Details in right sidelying, left UE D2 active  stretches   Person(s) Educated Patient   Methods Explanation;Verbal cues   Comprehension Verbalized understanding;Returned demonstration                [place] [facility] Goals - 06/17/16 1044      CC Long Term Goal  #1   Title left shoulder active flexion 140 degrees for improved overhead reach   Baseline 115 at eval; 131 degrees 06/17/16   Status On-going     CC Long Term Goal  #2   Title left shoulder active abduction 140 degrees for improved ADLs   Baseline 91 at eval; 115 degrees 06/17/16   Status On-going     CC Long Term Goal  #3   Title pain decreased at least 50% when reaching with left arm   Baseline 75-80% improvement reported at this time 06/17/16   Status Achieved     CC Long Term Goal  #4   Title quick DASH score reduced to 20 or less   Status On-going            Plan - 06/25/16 1210    Clinical Impression Statement Patient is doing well with stretching.  She now has mastectomy with immediate expander placement scheduled for 07/01/16.  She has an appointment here the day before that and will try to get one more in this week for stretching, as she has been told by her surgeon that she will need her arm overhead for surgery.   Rehab Potential Excellent   Clinical Impairments Affecting Rehab Potential upcoming surgery 07/01/16   PT Frequency 2x / week   PT Duration 4 weeks   PT Treatment/Interventions ADLs/Self Care Home Management;Therapeutic exercise;Patient/family education;Manual techniques;Scar mobilization;Passive range of motion   PT Next Visit Plan Repeat quick DASH.  Remeasure and reassess goals.  Cont Lt. shoulder P/AA/A/ROM; begin education about lymphedema risk reduction, strengthening.     PT Home Exercise Plan BMDC exercises or dowel stretches, supine over foam roll, neural tension stretch   Consulted and Agree with Plan of Care Patient      Patient will benefit from skilled therapeutic intervention in order to improve the following deficits  and impairments:  Decreased knowledge of precautions, Decreased knowledge of use of DME, Decreased range of motion, Decreased strength, Impaired UE functional use, Pain  Visit Diagnosis: Stiffness of left shoulder, not elsewhere classified  Acute pain of left shoulder  Aftercare following surgery for neoplasm     Problem List Patient Active Problem List   Diagnosis Date Noted  . Breast cancer of upper-outer quadrant of left female breast ([place]) 04/19/2016  . Breast cancer of upper-outer quadrant of right female breast ([place]) 03/31/2016  . Acid reflux 11/20/2015  . Bunion of great toe of right foot 04/04/2015  . Hemorrhoid 11/23/2014  . Anal skin tag 11/23/2014  . Anal burning 11/23/2014  . Plantar fasciitis, right  05/11/2014  . Retrocalcaneal bursitis 05/11/2014  . Pain in joint, ankle and foot 05/11/2014  . Heart burn   . Joint pain   . Abnormal Pap smear   . Osteopenia     Braydyn Schultes 06/25/2016, 12:13 PM  [place], [place], [address] Phone: [phone]   Fax:  [phone]  Name: Sabrina Tapia MRN: [id number] Date of Birth: Oct 26, 1950   Serafina Royals, PT 06/25/16 12:13 PM

## 2016-06-25 NOTE — H&P (Signed)
Subjective:     Patient ID: Sabrina Tapia is a 65 y.o. female.  HPI  Here for follow up discussion breast reconstruction. Presented following screening MMG 02/2016. In the UOQ of the right breast there was an area of architectural distortion, by [place] this measured 0.5 cm. The right axilla was sonographically benign. Biopsy of the right breast mass showed IDC, ER/PR+, Her2 -. On 03/26/2016 she underwent right lumpectomy and SLN with pathology 1/4 cm IDC, margins clear, and a macrometastatic deposit of ductal carcinoma in the single SLN. A separate 0.6 cm ILC was notedfocally involving the final right medial margin.   Breast MRI obtained post lumpectomy 2 enhancing masses posterior to the lumpectomy cavity, the larger measuring 1.0 cm.  One of these was biopsied and showed benign lymph node tissue. The MRI showed no evidence of additional disease in the RIGHT breast. In the LEFT breast centrally there was a 1.3 cm enhancing mass; biopsy of this was IDC. Biopsy of a second LEFT breast mass not seen on MRI, UOQ showed an IDC ER+/PR- , HER-2 -. Biopsy of LEFT axilla LN with IDC, ER/PR-.  She subsequently underwent left lumpectomy and SLN. Pathology wiht 1.5 cm IDC,  2/4 lymph nodes +  With positive margins. Last surgery 06/02/2016 to clear the positive superior margin and this showed IDC still present at margins and mastectomy has been recommended. Has discussed NSM with Dr. Donne Hazel.  Mammaprint bilateral low risk Genetics pending Prior to lumpectomies 38 C, notes smaller on left now with NAC displacing laterally.  Retired from [employer]. Lives alone.  Review of Systems       Objective:   Physical Exam  Cardiovascular: Normal rate and regular Sabrina.   Pulmonary/Chest: Effort normal and breath sounds normal.  Abdominal: Soft.    Pseudoptosis bilat, bilat axilla incisions, left UOQ curvilinear incision   SN to  Nipple R 23 L 23 cm BW R 15/ 12 cm chest wall L 15  cm Nipple to IMF R 8 L 8 cm  Assessment:     R breast ca UOQ s/p lumpectomy, SLN, reexcision for margins L breast ca UOQ s/p lumpectomy, SLN, reexcision for margins with continued positive margins Low risk mammaprint bilateral Genetics pending     Plan:     Patient will receive adjuvant radiation, though consult with Dr. Isidore Moos pending. Reviewed that radiation will double her risk reconstruction including wound healing problems and capsular contracture. It will also make changes to right breast including result smaller volume some lift to breast. She has elected for immediate expander placement on left, and we reviewed I I would recommend delay implant exchange until 6 months post end radiation. We also discussed any symmetry procedures on right breast would also carry increased risk due to anticipated radiation. We discussed possible breast lift on right at time of mastectomy to avoid radiation; however she is moderate size breast with pseudoptosis and following radiation she may not need any type of lift so will defer this for now.  Reviewed use of ADM in breast reconstruction, cadaveric source, and can act as additional nidus for infection if experiences infection or seroma as will take weeks to incorporate.  Reviewed incisions, drains, OR length, hospital stay and recovery. Discussed process of expansion and implant based risks including rupture, MRI surveillance for silicone implants, infection requiring surgery or removal, contracture. Discussed future surgery dependent on adjuvant treatments.  Reviewed post procedure limitations inc compression, drain care, prescriptions.   Reviewed reconstruction will be asensate.  Reviewedrisks mastectomy flap necrosis requiring additional surgery. Additional risks including but not limited to anesthesia, wound healing problems, damage to deeper structures, need for additional procedures, DVT/PE, cardiopulmonary complications, unacceptable  cosmetic result, hematoma, seroma, fat necrosis reviewed.  Reviewed SSM vs NSM, both will be asensate and not stimulate. Reviewed with both risks mastectomy flap necrosis requiring additional surgery. Plan NSM  Irene Limbo, MD [facility] [phone], pin [phone]

## 2016-06-26 ENCOUNTER — Ambulatory Visit: Payer: BC Managed Care – PPO | Admitting: Oncology

## 2016-06-27 ENCOUNTER — Ambulatory Visit: Payer: BC Managed Care – PPO | Attending: General Surgery | Admitting: Physical Therapy

## 2016-06-27 DIAGNOSIS — M25512 Pain in left shoulder: Secondary | ICD-10-CM | POA: Insufficient documentation

## 2016-06-27 DIAGNOSIS — M25612 Stiffness of left shoulder, not elsewhere classified: Secondary | ICD-10-CM | POA: Diagnosis not present

## 2016-06-27 DIAGNOSIS — Z483 Aftercare following surgery for neoplasm: Secondary | ICD-10-CM

## 2016-06-27 NOTE — Therapy (Signed)
[place], [place], [address] Phone: [phone]   Fax:  [phone]  Physical Therapy Treatment  Patient Details  Name: Sabrina Tapia MRN: [id number] Date of Birth: 01-04-1951 Referring Provider: Dr. Rolm Bookbinder  Encounter Date: 06/27/2016      PT End of Session - 06/27/16 1240    Visit Number 6   Number of Visits 9   Date for PT Re-Evaluation 07/11/16   PT Start Time 0850   PT Stop Time 0932   PT Time Calculation (min) 42 min   Activity Tolerance Patient tolerated treatment well   Behavior During Therapy [facility] for tasks assessed/performed      Past Medical History:  Diagnosis Date  . Anxiety   . Arthritis   . Asthma    allergy induced  . Cancer ([place]) 2017   bil breast cancer  . Cervical polyp   . Detrusor instability   . GERD (gastroesophageal reflux disease)   . Heart burn   . Hemorrhoids   . Hypertonicity of bladder   . Joint pain   . LGSIL (low grade squamous intraepithelial dysplasia) 2001   colpo biopsy negative, negative paps since  . Osteopenia 02/2016   T score -1.3 FRAX 8.3%/0.7% stable from prior DEXA    Past Surgical History:  Procedure Laterality Date  . INCISION AND DRAINAGE Left 06/02/2016   Procedure: DRAINAGE of left axilla seroma;  Surgeon: Rolm Bookbinder, MD;  Location: [place];  Service: General;  Laterality: Left;  . RADIOACTIVE SEED GUIDED MASTECTOMY WITH AXILLARY SENTINEL LYMPH NODE BIOPSY Right 03/26/2016   Procedure: RIGHT BREAST LUMPECTOMY WITH RADIOACTIVE SEED AND SENTINEL LYMPH NODE BIOPSY;  Surgeon: Rolm Bookbinder, MD;  Location: [place];  Service: General;  Laterality: Right;  . RADIOACTIVE SEED GUIDED MASTECTOMY WITH AXILLARY SENTINEL LYMPH NODE BIOPSY Left 05/20/2016   Procedure: LEFT BREAST RADIOACTIVE SEED (two seeds) GUIDED LUMPECTOMY WITH LEFT AXILLARY SENTINEL LYMPH NODE BIOPSY AND LEFT SEED TARGETED  AXILLARY LYMPH NODE EXCISION;  Surgeon: Rolm Bookbinder, MD;  Location: [place];  Service: General;  Laterality: Left;  . RE-EXCISION OF BREAST CANCER,SUPERIOR MARGINS Right 05/20/2016   Procedure: RE-EXCISION OF RIGHT BREAST MARGIN;  Surgeon: Rolm Bookbinder, MD;  Location: [place];  Service: General;  Laterality: Right;  . RE-EXCISION OF BREAST CANCER,SUPERIOR MARGINS Left 06/02/2016   Procedure: RE-EXCISION OF BREAST CANCER,SUPERIOR MARGINS;  Surgeon: Rolm Bookbinder, MD;  Location: [place];  Service: General;  Laterality: Left;    There were no vitals filed for this visit.      Subjective Assessment - 06/27/16 0850    Subjective Was sore after last visit--took it a little easy with the exercises that night.  "It did its job."   Currently in Pain? Yes            [facility] PT Assessment - 06/27/16 0001      Observation/Other Assessments   Quick DASH  9.09 with 10 of 11 answered     AROM   Right Shoulder Flexion 150 Degrees   Right Shoulder ABduction 180 Degrees   Left Shoulder Flexion 150 Degrees   Left Shoulder ABduction 180 Degrees              Quick Dash - 06/27/16 0001    Open a tight or new jar Moderate difficulty   Do heavy household chores (wash walls, wash floors) No difficulty   Carry a shopping bag or  briefcase No difficulty   Wash your back Mild difficulty   Use a knife to cut food No difficulty   During the past week, to what extent has your arm, shoulder or hand problem interfered with your normal social activities with family, friends, neighbors, or groups? Slightly   During the past week, to what extent has your arm, shoulder or hand problem limited your work or other regular daily activities Slightly   Arm, shoulder, or hand pain. None   Tingling (pins and needles) in your arm, shoulder, or hand None   Difficulty Sleeping No difficulty   DASH Score 9.09 %               [facility] Adult PT  Treatment/Exercise - 06/27/16 0001      Self-Care   Self-Care Other Self-Care Comments   Other Self-Care Comments  instructed about lymphedema risk reduction; patient has a nephew who can help her obtain compression sleeves     Shoulder Exercises: Pulleys   Flexion 2 minutes   ABduction 2 minutes     Shoulder Exercises: Therapy Ball   Flexion 10 reps  Forward lean into top of stretch   ABduction 10 reps  Lt UE with side lean into top of stretch     Manual Therapy   Myofascial Release Left UE myofascial pulling in supine   Scapular Mobilization In right sidelying for left scapula for protraction and depression.   Passive ROM In supine to left shoulder into abduction and flexion to patient tolerance                PT Education - 06/27/16 1244    Education provided Yes   Education Details lymphedema risk reduction practices   Person(s) Educated Patient   Methods Explanation;Handout   Comprehension Verbalized understanding                [facility] Goals - 06/27/16 1245      CC Long Term Goal  #1   Title left shoulder active flexion 140 degrees for improved overhead reach   Status Achieved     CC Long Term Goal  #2   Title left shoulder active abduction 140 degrees for improved ADLs   Status Achieved     CC Long Term Goal  #4   Title quick DASH score reduced to 20 or less   Baseline 55 at eval; 9.09 with 10 of 11 questions answered   Status Achieved            Plan - 06/27/16 1241    Clinical Impression Statement Pt.'s AROM has improved signficantly on both left and right, so she has met those goals.  Still with some tightness and discomfort, but she is tolerating treatment well.  She will have one more appointment here prior to surgery for left side on 07/01/16.   Rehab Potential Excellent   Clinical Impairments Affecting Rehab Potential upcoming surgery 07/01/16   PT Frequency 2x / week   PT Duration 4 weeks   PT Treatment/Interventions  ADLs/Self Care Home Management;Therapeutic exercise;Patient/family education;Manual techniques;Scar mobilization;Passive range of motion   PT Next Visit Plan Continue left shoulder P/AA/A/ROM in prep for surgery positioning.   PT Home Exercise Plan BMDC exercises or dowel stretches, supine over foam roll, neural tension stretch   Consulted and Agree with Plan of Care Patient      Patient will benefit from skilled therapeutic intervention in order to improve the following deficits and impairments:  Decreased knowledge  of precautions, Decreased knowledge of use of DME, Decreased range of motion, Decreased strength, Impaired UE functional use, Pain  Visit Diagnosis: Stiffness of left shoulder, not elsewhere classified  Acute pain of left shoulder  Aftercare following surgery for neoplasm     Problem List Patient Active Problem List   Diagnosis Date Noted  . Breast cancer of upper-outer quadrant of left female breast ([place]) 04/19/2016  . Breast cancer of upper-outer quadrant of right female breast ([place]) 03/31/2016  . Acid reflux 11/20/2015  . Bunion of great toe of right foot 04/04/2015  . Hemorrhoid 11/23/2014  . Anal skin tag 11/23/2014  . Anal burning 11/23/2014  . Plantar fasciitis, right 05/11/2014  . Retrocalcaneal bursitis 05/11/2014  . Pain in joint, ankle and foot 05/11/2014  . Heart burn   . Joint pain   . Abnormal Pap smear   . Osteopenia     SALISBURY,DONNA 06/27/2016, 12:47 PM  [place], [place], [address] Phone: [phone]   Fax:  [phone]  Name: Sabrina Tapia MRN: [id number] Date of Birth: [birth year]  Serafina Royals, PT 06/27/16 12:48 PM

## 2016-06-27 NOTE — Progress Notes (Signed)
Pt given 8 oz carton of boost breeze with verbal and written instructions to drink by 830 am morning of surgery. Do not drink any other liquids or eat after midnight.  Teach back   Pt voiced understanding,

## 2016-06-30 ENCOUNTER — Ambulatory Visit: Payer: BC Managed Care – PPO | Admitting: Physical Therapy

## 2016-06-30 DIAGNOSIS — M25612 Stiffness of left shoulder, not elsewhere classified: Secondary | ICD-10-CM

## 2016-06-30 DIAGNOSIS — M25512 Pain in left shoulder: Secondary | ICD-10-CM

## 2016-06-30 DIAGNOSIS — Z483 Aftercare following surgery for neoplasm: Secondary | ICD-10-CM

## 2016-06-30 NOTE — Therapy (Signed)
[place], [place], [address] Phone: [phone]   Fax:  [phone]  Physical Therapy Treatment  Patient Details  Name: Sabrina Tapia MRN: [id number] Date of Birth: 08-30-1950 Referring Provider: Dr. Rolm Bookbinder  Encounter Date: 06/30/2016      PT End of Session - 06/30/16 1651    Visit Number 7   Number of Visits 9   Date for PT Re-Evaluation 07/11/16   PT Start Time 1350   PT Stop Time 1434   PT Time Calculation (min) 44 min   Activity Tolerance Patient tolerated treatment well   Behavior During Therapy [facility] for tasks assessed/performed      Past Medical History:  Diagnosis Date  . Anxiety   . Arthritis   . Asthma    allergy induced  . Cancer ([place]) 2017   bil breast cancer  . Cervical polyp   . Detrusor instability   . GERD (gastroesophageal reflux disease)   . Heart burn   . Hemorrhoids   . Hypertonicity of bladder   . Joint pain   . LGSIL (low grade squamous intraepithelial dysplasia) 2001   colpo biopsy negative, negative paps since  . Osteopenia 02/2016   T score -1.3 FRAX 8.3%/0.7% stable from prior DEXA    Past Surgical History:  Procedure Laterality Date  . INCISION AND DRAINAGE Left 06/02/2016   Procedure: DRAINAGE of left axilla seroma;  Surgeon: Rolm Bookbinder, MD;  Location: [place];  Service: General;  Laterality: Left;  . RADIOACTIVE SEED GUIDED MASTECTOMY WITH AXILLARY SENTINEL LYMPH NODE BIOPSY Right 03/26/2016   Procedure: RIGHT BREAST LUMPECTOMY WITH RADIOACTIVE SEED AND SENTINEL LYMPH NODE BIOPSY;  Surgeon: Rolm Bookbinder, MD;  Location: [place];  Service: General;  Laterality: Right;  . RADIOACTIVE SEED GUIDED MASTECTOMY WITH AXILLARY SENTINEL LYMPH NODE BIOPSY Left 05/20/2016   Procedure: LEFT BREAST RADIOACTIVE SEED (two seeds) GUIDED LUMPECTOMY WITH LEFT AXILLARY SENTINEL LYMPH NODE BIOPSY AND LEFT SEED TARGETED  AXILLARY LYMPH NODE EXCISION;  Surgeon: Rolm Bookbinder, MD;  Location: [place];  Service: General;  Laterality: Left;  . RE-EXCISION OF BREAST CANCER,SUPERIOR MARGINS Right 05/20/2016   Procedure: RE-EXCISION OF RIGHT BREAST MARGIN;  Surgeon: Rolm Bookbinder, MD;  Location: [place];  Service: General;  Laterality: Right;  . RE-EXCISION OF BREAST CANCER,SUPERIOR MARGINS Left 06/02/2016   Procedure: RE-EXCISION OF BREAST CANCER,SUPERIOR MARGINS;  Surgeon: Rolm Bookbinder, MD;  Location: [place];  Service: General;  Laterality: Left;    There were no vitals filed for this visit.      Subjective Assessment - 06/30/16 1349    Subjective "I guess I'm ready for surgery."   Currently in Pain? No/denies                         [facility] Adult PT Treatment/Exercise - 06/30/16 0001      Shoulder Exercises: Supine   Other Supine Exercises supine over foam roller, active "Y" motions bilat. x 10     Shoulder Exercises: Seated   Other Seated Exercises backward shoulder rolls x 10     Shoulder Exercises: Pulleys   Flexion 2 minutes   ABduction 2 minutes     Shoulder Exercises: Therapy Ball   Flexion 10 reps  Forward lean into top of stretch     Manual Therapy   Manual Therapy Edema management   Edema Management Measured both patient's arms for  fitting of compression sleeves that she will get through her nephew   Myofascial Release Left UE myofascial pulling in supine with movement into abduction   Scapular Mobilization In right sidelying for left scapula for protraction and depression.   Passive ROM In supine to left shoulder into abduction and flexion to patient tolerance; supine over foam roll, pect stretches bilat. x 60 seconds x 2                        [facility] Goals - 06/30/16 1653      CC Long Term Goal  #1   Title left shoulder active flexion 140 degrees for improved overhead reach    Status Achieved     CC Long Term Goal  #2   Title left shoulder active abduction 140 degrees for improved ADLs   Status Achieved     CC Long Term Goal  #3   Title pain decreased at least 50% when reaching with left arm   Status Achieved     CC Long Term Goal  #4   Title quick DASH score reduced to 20 or less   Status Achieved            Plan - 06/30/16 1651    Clinical Impression Statement Patient is doing very well today one day prior to her left mastectomy with immediate reconstruction.  She asked for arm measurements for obtaining compression sleeves, which she will get through her nephew who works with [employer].  These were taken.  [employer] Bella [employer], class I were recommended, and it looks like she will need a size 1.     Rehab Potential Excellent   Clinical Impairments Affecting Rehab Potential upcoming surgery 07/01/16   PT Duration 4 weeks   PT Treatment/Interventions ADLs/Self Care Home Management;Therapeutic exercise;Patient/family education;Manual techniques;Scar mobilization;Passive range of motion   PT Next Visit Plan None at this time.  Will discharge now; should she need therapy following her next surgery, she will need a new referral.   PT Home Exercise Plan BMDC exercises or dowel stretches, supine over foam roll, neural tension stretch   Consulted and Agree with Plan of Care Patient      Patient will benefit from skilled therapeutic intervention in order to improve the following deficits and impairments:  Decreased knowledge of precautions, Decreased knowledge of use of DME, Decreased range of motion, Decreased strength, Impaired UE functional use, Pain  Visit Diagnosis: Stiffness of left shoulder, not elsewhere classified  Acute pain of left shoulder  Aftercare following surgery for neoplasm     Problem List Patient Active Problem List   Diagnosis Date Noted  . Breast cancer of upper-outer quadrant of left female breast ([place]) 04/19/2016  . Breast  cancer of upper-outer quadrant of right female breast ([place]) 03/31/2016  . Acid reflux 11/20/2015  . Bunion of great toe of right foot 04/04/2015  . Hemorrhoid 11/23/2014  . Anal skin tag 11/23/2014  . Anal burning 11/23/2014  . Plantar fasciitis, right 05/11/2014  . Retrocalcaneal bursitis 05/11/2014  . Pain in joint, ankle and foot 05/11/2014  . Heart burn   . Joint pain   . Abnormal Pap smear   . Osteopenia     SALISBURY,DONNA 06/30/2016, 4:55 PM  [place], [place], [address] Phone: [phone]   Fax:  [phone]  Name: Sabrina Tapia MRN: [id number] Date of Birth: 10/05/1950   PHYSICAL THERAPY DISCHARGE SUMMARY  Visits from Start of Care: 7  Current functional level related to goals / functional outcomes: Goals met as noted above.   Remaining deficits: Minimal deficits at this time in PROM.   Education / Equipment: HEP for stretching; info about compression sleeve fitting. Plan: Patient agrees to discharge.  Patient goals were met. Patient is being discharged due to meeting the stated rehab goals.  ?????    She is scheduled for left mastectomy with immediate reconstruction tomorrow.  Serafina Royals, PT 06/30/16 4:56 PM

## 2016-07-01 ENCOUNTER — Ambulatory Visit (HOSPITAL_BASED_OUTPATIENT_CLINIC_OR_DEPARTMENT_OTHER)
Admission: RE | Admit: 2016-07-01 | Discharge: 2016-07-02 | Disposition: A | Discharge status: Home / Self Care | Payer: BC Managed Care – PPO | Source: Ambulatory Visit | Attending: General Surgery | Admitting: General Surgery

## 2016-07-01 ENCOUNTER — Ambulatory Visit (HOSPITAL_BASED_OUTPATIENT_CLINIC_OR_DEPARTMENT_OTHER): Payer: BC Managed Care – PPO | Admitting: Anesthesiology

## 2016-07-01 ENCOUNTER — Encounter (HOSPITAL_BASED_OUTPATIENT_CLINIC_OR_DEPARTMENT_OTHER): Payer: Self-pay | Admitting: Anesthesiology

## 2016-07-01 ENCOUNTER — Encounter (HOSPITAL_BASED_OUTPATIENT_CLINIC_OR_DEPARTMENT_OTHER)
Admission: RE | Disposition: A | Discharge status: Home / Self Care | Payer: Self-pay | Source: Ambulatory Visit | Attending: General Surgery

## 2016-07-01 DIAGNOSIS — J45909 Unspecified asthma, uncomplicated: Secondary | ICD-10-CM | POA: Insufficient documentation

## 2016-07-01 DIAGNOSIS — Z170 Estrogen receptor positive status [ER+]: Secondary | ICD-10-CM | POA: Insufficient documentation

## 2016-07-01 DIAGNOSIS — C50412 Malignant neoplasm of upper-outer quadrant of left female breast: Secondary | ICD-10-CM | POA: Insufficient documentation

## 2016-07-01 DIAGNOSIS — Z853 Personal history of malignant neoplasm of breast: Secondary | ICD-10-CM | POA: Diagnosis not present

## 2016-07-01 DIAGNOSIS — Z800 Family history of malignant neoplasm of digestive organs: Secondary | ICD-10-CM | POA: Insufficient documentation

## 2016-07-01 DIAGNOSIS — Z888 Allergy status to other drugs, medicaments and biological substances status: Secondary | ICD-10-CM | POA: Diagnosis not present

## 2016-07-01 DIAGNOSIS — C50912 Malignant neoplasm of unspecified site of left female breast: Secondary | ICD-10-CM | POA: Diagnosis present

## 2016-07-01 DIAGNOSIS — Z8249 Family history of ischemic heart disease and other diseases of the circulatory system: Secondary | ICD-10-CM | POA: Insufficient documentation

## 2016-07-01 DIAGNOSIS — F419 Anxiety disorder, unspecified: Secondary | ICD-10-CM | POA: Insufficient documentation

## 2016-07-01 DIAGNOSIS — Z87891 Personal history of nicotine dependence: Secondary | ICD-10-CM | POA: Insufficient documentation

## 2016-07-01 DIAGNOSIS — Z803 Family history of malignant neoplasm of breast: Secondary | ICD-10-CM | POA: Diagnosis not present

## 2016-07-01 DIAGNOSIS — Z833 Family history of diabetes mellitus: Secondary | ICD-10-CM | POA: Diagnosis not present

## 2016-07-01 DIAGNOSIS — K219 Gastro-esophageal reflux disease without esophagitis: Secondary | ICD-10-CM | POA: Insufficient documentation

## 2016-07-01 DIAGNOSIS — M8580 Other specified disorders of bone density and structure, unspecified site: Secondary | ICD-10-CM | POA: Insufficient documentation

## 2016-07-01 HISTORY — PX: BREAST RECONSTRUCTION WITH PLACEMENT OF TISSUE EXPANDER AND FLEX HD (ACELLULAR HYDRATED DERMIS): SHX6295

## 2016-07-01 HISTORY — PX: NIPPLE SPARING MASTECTOMY: SHX6537

## 2016-07-01 LAB — POCT HEMOGLOBIN-HEMACUE: HEMOGLOBIN: 14.4 g/dL (ref 12.0–15.0)

## 2016-07-01 SURGERY — MASTECTOMY, NIPPLE SPARING
Anesthesia: General | Site: Breast | Laterality: Left

## 2016-07-01 MED ORDER — ALPRAZOLAM 0.25 MG PO TABS
0.2500 mg | ORAL_TABLET | Freq: Every evening | ORAL | Status: DC | PRN
  Administered 2016-07-01: 0.25 mg via ORAL
  Filled 2016-07-01: qty 1

## 2016-07-01 MED ORDER — ALBUTEROL SULFATE HFA 108 (90 BASE) MCG/ACT IN AERS: 2.0000 | INHALATION_SPRAY | RESPIRATORY_TRACT | Status: DC | PRN

## 2016-07-01 MED ORDER — BUPIVACAINE-EPINEPHRINE (PF) 0.25% -1:200000 IJ SOLN
INTRAMUSCULAR | Status: DC | PRN
  Administered 2016-07-01: 30 mL via PERINEURAL

## 2016-07-01 MED ORDER — LACTATED RINGERS IV SOLN
INTRAVENOUS | Status: DC
  Administered 2016-07-01 (×3): via INTRAVENOUS

## 2016-07-01 MED ORDER — LIDOCAINE 2% (20 MG/ML) 5 ML SYRINGE
INTRAMUSCULAR | Status: AC
Start: 2016-07-01 — End: 2016-07-01
  Filled 2016-07-01: qty 5

## 2016-07-01 MED ORDER — DEXAMETHASONE SODIUM PHOSPHATE 10 MG/ML IJ SOLN
INTRAMUSCULAR | Status: AC
  Filled 2016-07-01: qty 1

## 2016-07-01 MED ORDER — MIDAZOLAM HCL 2 MG/2ML IJ SOLN
1.0000 mg | INTRAMUSCULAR | Status: DC | PRN
  Administered 2016-07-01: 2 mg via INTRAVENOUS

## 2016-07-01 MED ORDER — PHENYLEPHRINE 40 MCG/ML (10ML) SYRINGE FOR IV PUSH (FOR BLOOD PRESSURE SUPPORT)
PREFILLED_SYRINGE | INTRAVENOUS | Status: AC
  Filled 2016-07-01: qty 10

## 2016-07-01 MED ORDER — GABAPENTIN 300 MG PO CAPS
ORAL_CAPSULE | ORAL | Status: AC
  Filled 2016-07-01: qty 1

## 2016-07-01 MED ORDER — PHENYLEPHRINE HCL 10 MG/ML IJ SOLN
INTRAVENOUS | Status: DC | PRN
  Administered 2016-07-01: 25 ug/min via INTRAVENOUS

## 2016-07-01 MED ORDER — PROPOFOL 10 MG/ML IV BOLUS
INTRAVENOUS | Status: AC
Start: 2016-07-01 — End: 2016-07-01
  Filled 2016-07-01: qty 20

## 2016-07-01 MED ORDER — FENTANYL CITRATE (PF) 100 MCG/2ML IJ SOLN
INTRAMUSCULAR | Status: AC
  Filled 2016-07-01: qty 2

## 2016-07-01 MED ORDER — OXYCODONE HCL 5 MG PO TABS: 5.0000 mg | ORAL_TABLET | ORAL | 0 refills | Status: DC | PRN

## 2016-07-01 MED ORDER — ACETAMINOPHEN 650 MG RE SUPP: 650.0000 mg | Freq: Four times a day (QID) | RECTAL | Status: DC | PRN

## 2016-07-01 MED ORDER — OXYCODONE HCL 5 MG PO TABS
5.0000 mg | ORAL_TABLET | ORAL | Status: DC | PRN
  Administered 2016-07-01 (×2): 5 mg via ORAL
  Administered 2016-07-02 (×2): 10 mg via ORAL
  Filled 2016-07-01 (×3): qty 2

## 2016-07-01 MED ORDER — SULFAMETHOXAZOLE-TRIMETHOPRIM 800-160 MG PO TABS: 1.0000 | ORAL_TABLET | Freq: Two times a day (BID) | ORAL | 0 refills | Status: DC

## 2016-07-01 MED ORDER — SODIUM CHLORIDE 0.9 % IV SOLN
INTRAVENOUS | Status: DC
  Administered 2016-07-01: 18:00:00 via INTRAVENOUS

## 2016-07-01 MED ORDER — VENLAFAXINE HCL ER 75 MG PO CP24: 75.0000 mg | ORAL_CAPSULE | Freq: Every day | ORAL | Status: DC

## 2016-07-01 MED ORDER — MEPERIDINE HCL 25 MG/ML IJ SOLN: 6.2500 mg | INTRAMUSCULAR | Status: DC | PRN

## 2016-07-01 MED ORDER — SCOPOLAMINE 1 MG/3DAYS TD PT72: 1.0000 | MEDICATED_PATCH | Freq: Once | TRANSDERMAL | Status: DC | PRN

## 2016-07-01 MED ORDER — ACETAMINOPHEN 500 MG PO TABS
1000.0000 mg | ORAL_TABLET | ORAL | Status: AC
  Administered 2016-07-01: 1000 mg via ORAL

## 2016-07-01 MED ORDER — CEFAZOLIN SODIUM-DEXTROSE 2-4 GM/100ML-% IV SOLN
2.0000 g | INTRAVENOUS | Status: AC
  Administered 2016-07-01: 2 g via INTRAVENOUS

## 2016-07-01 MED ORDER — ONDANSETRON HCL 4 MG/2ML IJ SOLN: 4.0000 mg | Freq: Four times a day (QID) | INTRAMUSCULAR | Status: DC | PRN

## 2016-07-01 MED ORDER — FENTANYL CITRATE (PF) 100 MCG/2ML IJ SOLN
INTRAMUSCULAR | Status: DC | PRN
  Administered 2016-07-01 (×2): 25 ug via INTRAVENOUS
  Administered 2016-07-01: 100 ug via INTRAVENOUS
  Administered 2016-07-01 (×2): 25 ug via INTRAVENOUS

## 2016-07-01 MED ORDER — KETOROLAC TROMETHAMINE 15 MG/ML IJ SOLN
15.0000 mg | Freq: Three times a day (TID) | INTRAMUSCULAR | Status: DC
  Administered 2016-07-01 – 2016-07-02 (×2): 15 mg via INTRAVENOUS
  Filled 2016-07-01 (×2): qty 1

## 2016-07-01 MED ORDER — DEXAMETHASONE SODIUM PHOSPHATE 4 MG/ML IJ SOLN
INTRAMUSCULAR | Status: DC | PRN
  Administered 2016-07-01: 10 mg via INTRAVENOUS

## 2016-07-01 MED ORDER — ONDANSETRON HCL 4 MG/2ML IJ SOLN: 4.0000 mg | Freq: Once | INTRAMUSCULAR | Status: DC | PRN

## 2016-07-01 MED ORDER — PHENYLEPHRINE HCL 10 MG/ML IJ SOLN
INTRAMUSCULAR | Status: DC | PRN
  Administered 2016-07-01 (×6): 80 ug via INTRAVENOUS

## 2016-07-01 MED ORDER — METHOCARBAMOL 500 MG PO TABS: 500.0000 mg | ORAL_TABLET | Freq: Four times a day (QID) | ORAL | Status: DC | PRN

## 2016-07-01 MED ORDER — CEFAZOLIN IN D5W 1 GM/50ML IV SOLN
1.0000 g | Freq: Three times a day (TID) | INTRAVENOUS | Status: DC
  Administered 2016-07-01 – 2016-07-02 (×2): 1 g via INTRAVENOUS
  Filled 2016-07-01 (×2): qty 50

## 2016-07-01 MED ORDER — ONDANSETRON HCL 4 MG/2ML IJ SOLN
INTRAMUSCULAR | Status: AC
  Filled 2016-07-01: qty 2

## 2016-07-01 MED ORDER — ACETAMINOPHEN 325 MG PO TABS: 650.0000 mg | ORAL_TABLET | Freq: Four times a day (QID) | ORAL | Status: DC | PRN

## 2016-07-01 MED ORDER — ONDANSETRON 4 MG PO TBDP: 4.0000 mg | ORAL_TABLET | Freq: Four times a day (QID) | ORAL | Status: DC | PRN

## 2016-07-01 MED ORDER — LIDOCAINE HCL (CARDIAC) 20 MG/ML IV SOLN
INTRAVENOUS | Status: DC | PRN
  Administered 2016-07-01: 30 mg via INTRAVENOUS

## 2016-07-01 MED ORDER — FENTANYL CITRATE (PF) 100 MCG/2ML IJ SOLN
50.0000 ug | INTRAMUSCULAR | Status: DC | PRN
  Administered 2016-07-01: 100 ug via INTRAVENOUS

## 2016-07-01 MED ORDER — BECLOMETHASONE DIPROPIONATE 40 MCG/ACT IN AERS
2.0000 | INHALATION_SPRAY | Freq: Two times a day (BID) | RESPIRATORY_TRACT | Status: DC
  Administered 2016-07-01: 2 via RESPIRATORY_TRACT

## 2016-07-01 MED ORDER — ACETAMINOPHEN 500 MG PO TABS
ORAL_TABLET | ORAL | Status: AC
  Filled 2016-07-01: qty 2

## 2016-07-01 MED ORDER — MORPHINE SULFATE (PF) 2 MG/ML IV SOLN: 2.0000 mg | INTRAVENOUS | Status: DC | PRN

## 2016-07-01 MED ORDER — 0.9 % SODIUM CHLORIDE (POUR BTL) OPTIME
TOPICAL | Status: DC | PRN
  Administered 2016-07-01: 200 mL

## 2016-07-01 MED ORDER — HYDROMORPHONE HCL 1 MG/ML IJ SOLN
INTRAMUSCULAR | Status: AC
  Filled 2016-07-01: qty 1

## 2016-07-01 MED ORDER — MIDAZOLAM HCL 5 MG/5ML IJ SOLN
INTRAMUSCULAR | Status: DC | PRN
  Administered 2016-07-01: 2 mg via INTRAVENOUS

## 2016-07-01 MED ORDER — HYDROMORPHONE HCL 1 MG/ML IJ SOLN
0.2500 mg | INTRAMUSCULAR | Status: DC | PRN
  Administered 2016-07-01 (×2): 0.5 mg via INTRAVENOUS

## 2016-07-01 MED ORDER — METHOCARBAMOL 500 MG PO TABS: 500.0000 mg | ORAL_TABLET | Freq: Three times a day (TID) | ORAL | 0 refills | Status: DC | PRN

## 2016-07-01 MED ORDER — SODIUM CHLORIDE 0.9 % IV SOLN
INTRAVENOUS | Status: DC | PRN
  Administered 2016-07-01: 1000 mL

## 2016-07-01 MED ORDER — MIDAZOLAM HCL 2 MG/2ML IJ SOLN
INTRAMUSCULAR | Status: AC
  Filled 2016-07-01: qty 2

## 2016-07-01 MED ORDER — GABAPENTIN 300 MG PO CAPS
300.0000 mg | ORAL_CAPSULE | ORAL | Status: AC
  Administered 2016-07-01: 300 mg via ORAL

## 2016-07-01 MED ORDER — PHENYLEPHRINE HCL 10 MG/ML IJ SOLN
INTRAMUSCULAR | Status: AC
  Filled 2016-07-01: qty 1

## 2016-07-01 MED ORDER — PROPOFOL 10 MG/ML IV BOLUS
INTRAVENOUS | Status: DC | PRN
  Administered 2016-07-01: 200 mg via INTRAVENOUS

## 2016-07-01 SURGICAL SUPPLY — 102 items
ADH SKN CLS APL DERMABOND .7 (GAUZE/BANDAGES/DRESSINGS) ×1
ALLODERM 4X12 THICK (Tissue) ×1 IMPLANT
APL SKNCLS STERI-STRIP NONHPOA (GAUZE/BANDAGES/DRESSINGS)
APPLIER CLIP 11 MED OPEN (CLIP)
APR CLP MED 11 20 MLT OPN (CLIP)
BAG DECANTER FOR FLEXI CONT (MISCELLANEOUS) ×3 IMPLANT
BENZOIN TINCTURE PRP APPL 2/3 (GAUZE/BANDAGES/DRESSINGS) IMPLANT
BINDER BREAST LRG (GAUZE/BANDAGES/DRESSINGS) ×3 IMPLANT
BINDER BREAST MEDIUM (GAUZE/BANDAGES/DRESSINGS) IMPLANT
BINDER BREAST XLRG (GAUZE/BANDAGES/DRESSINGS) IMPLANT
BINDER BREAST XXLRG (GAUZE/BANDAGES/DRESSINGS) IMPLANT
BIOPATCH RED 1 DISK 7.0 (GAUZE/BANDAGES/DRESSINGS) ×1 IMPLANT
BLADE CLIPPER SURG (BLADE) IMPLANT
BLADE HEX COATED 2.75 (ELECTRODE) ×2 IMPLANT
BLADE SURG 10 STRL SS (BLADE) ×3 IMPLANT
BLADE SURG 15 STRL LF DISP TIS (BLADE) ×1 IMPLANT
BLADE SURG 15 STRL SS (BLADE) ×2
BNDG GAUZE ELAST 4 BULKY (GAUZE/BANDAGES/DRESSINGS) ×4 IMPLANT
CANISTER SUCT 1200ML W/VALVE (MISCELLANEOUS) ×3 IMPLANT
CHLORAPREP W/TINT 26ML (MISCELLANEOUS) ×4 IMPLANT
CLIP APPLIE 11 MED OPEN (CLIP) ×1 IMPLANT
CLIP TI WIDE RED SMALL 6 (CLIP) IMPLANT
COVER BACK TABLE 60X90IN (DRAPES) ×2 IMPLANT
COVER MAYO STAND STRL (DRAPES) ×3 IMPLANT
COVER PROBE W GEL 5X96 (DRAPES) ×1 IMPLANT
DECANTER SPIKE VIAL GLASS SM (MISCELLANEOUS) IMPLANT
DERMABOND ADVANCED (GAUZE/BANDAGES/DRESSINGS) ×1
DERMABOND ADVANCED .7 DNX12 (GAUZE/BANDAGES/DRESSINGS) ×2 IMPLANT
DRAIN CHANNEL 15F RND FF W/TCR (WOUND CARE) ×1 IMPLANT
DRAIN CHANNEL 19F RND (DRAIN) ×1 IMPLANT
DRAPE LAPAROSCOPIC ABDOMINAL (DRAPES) IMPLANT
DRAPE TOP ARMCOVERS (MISCELLANEOUS) ×2 IMPLANT
DRAPE U-SHAPE 76X120 STRL (DRAPES) ×1 IMPLANT
DRAPE UTILITY XL STRL (DRAPES) ×2 IMPLANT
DRSG PAD ABDOMINAL 8X10 ST (GAUZE/BANDAGES/DRESSINGS) ×4 IMPLANT
DRSG TEGADERM 4X10 (GAUZE/BANDAGES/DRESSINGS) ×1 IMPLANT
DRSG TEGADERM 4X4.75 (GAUZE/BANDAGES/DRESSINGS) ×1 IMPLANT
ELECT BLADE 4.0 EZ CLEAN MEGAD (MISCELLANEOUS) ×2
ELECT BLADE 6.5 .24CM SHAFT (ELECTRODE) ×1 IMPLANT
ELECT COATED BLADE 2.86 ST (ELECTRODE) ×2 IMPLANT
ELECT REM PT RETURN 9FT ADLT (ELECTROSURGICAL) ×2
ELECTRODE BLDE 4.0 EZ CLN MEGD (MISCELLANEOUS) ×1 IMPLANT
ELECTRODE REM PT RTRN 9FT ADLT (ELECTROSURGICAL) ×2 IMPLANT
EVACUATOR SILICONE 100CC (DRAIN) ×3 IMPLANT
EXPANDER TISSUE MX 400CC (Breast) IMPLANT
GAUZE SPONGE 4X4 12PLY STRL (GAUZE/BANDAGES/DRESSINGS) ×1 IMPLANT
GLOVE BIO SURGEON STRL SZ 6 (GLOVE) ×5 IMPLANT
GLOVE BIO SURGEON STRL SZ 6.5 (GLOVE) IMPLANT
GLOVE BIO SURGEON STRL SZ7 (GLOVE) ×2 IMPLANT
GLOVE BIOGEL PI IND STRL 7.5 (GLOVE) ×1 IMPLANT
GLOVE BIOGEL PI INDICATOR 7.5 (GLOVE) ×1
GOWN STRL REUS W/ TWL LRG LVL3 (GOWN DISPOSABLE) ×5 IMPLANT
GOWN STRL REUS W/TWL LRG LVL3 (GOWN DISPOSABLE) ×8
ILLUMINATOR WAVEGUIDE N/F (MISCELLANEOUS) IMPLANT
IV NS 500ML (IV SOLUTION) ×2
IV NS 500ML BAXH (IV SOLUTION) ×2 IMPLANT
KIT FILL SYSTEM UNIVERSAL (SET/KITS/TRAYS/PACK) ×3 IMPLANT
KIT MARKER MARGIN INK (KITS) IMPLANT
LIGHT WAVEGUIDE WIDE FLAT (MISCELLANEOUS) ×1 IMPLANT
LIQUID BAND (GAUZE/BANDAGES/DRESSINGS) ×2 IMPLANT
NDL HYPO 25X1 1.5 SAFETY (NEEDLE) IMPLANT
NDL SAFETY ECLIPSE 18X1.5 (NEEDLE) IMPLANT
NEEDLE HYPO 18GX1.5 SHARP (NEEDLE)
NEEDLE HYPO 25X1 1.5 SAFETY (NEEDLE) ×2 IMPLANT
NS IRRIG 1000ML POUR BTL (IV SOLUTION) ×1 IMPLANT
PACK BASIN DAY SURGERY FS (CUSTOM PROCEDURE TRAY) ×3 IMPLANT
PACK UNIVERSAL I (CUSTOM PROCEDURE TRAY) ×1 IMPLANT
PENCIL BUTTON HOLSTER BLD 10FT (ELECTRODE) ×4 IMPLANT
PIN SAFETY STERILE (MISCELLANEOUS) ×2 IMPLANT
SHEET MEDIUM DRAPE 40X70 STRL (DRAPES) ×1 IMPLANT
SLEEVE SCD COMPRESS KNEE MED (MISCELLANEOUS) ×4 IMPLANT
SPONGE LAP 18X18 X RAY DECT (DISPOSABLE) ×7 IMPLANT
STAPLER VISISTAT 35W (STAPLE) ×1 IMPLANT
STRIP CLOSURE SKIN 1/2X4 (GAUZE/BANDAGES/DRESSINGS) IMPLANT
SUT ETHIBOND 2-0 V-5 NDL (SUTURE) IMPLANT
SUT ETHIBOND 2-0 V-5 NEEDLE (SUTURE) IMPLANT
SUT ETHILON 2 0 FS 18 (SUTURE) ×1 IMPLANT
SUT ETHILON 3 0 PS 1 (SUTURE) ×1 IMPLANT
SUT MNCRL AB 3-0 PS2 18 (SUTURE) ×1 IMPLANT
SUT MNCRL AB 4-0 PS2 18 (SUTURE) ×2 IMPLANT
SUT MON AB 5-0 PS2 18 (SUTURE) IMPLANT
SUT PDS AB 2-0 CT2 27 (SUTURE) IMPLANT
SUT SILK 2 0 SH (SUTURE) ×1 IMPLANT
SUT SILK 3 0 PS 1 (SUTURE) ×1 IMPLANT
SUT VIC AB 0 CT1 27 (SUTURE)
SUT VIC AB 0 CT1 27XBRD ANBCTR (SUTURE) IMPLANT
SUT VIC AB 0 SH 27 (SUTURE) ×2 IMPLANT
SUT VIC AB 3-0 SH 27 (SUTURE) ×12
SUT VIC AB 3-0 SH 27X BRD (SUTURE) ×1 IMPLANT
SUT VICRYL 4-0 PS2 18IN ABS (SUTURE) ×2 IMPLANT
SUT VLOC 180 0 24IN GS25 (SUTURE) IMPLANT
SYR 50ML LL SCALE MARK (SYRINGE) IMPLANT
SYR BULB IRRIGATION 50ML (SYRINGE) ×5 IMPLANT
SYR CONTROL 10ML LL (SYRINGE) IMPLANT
TAPE MEASURE VINYL STERILE (MISCELLANEOUS) ×1 IMPLANT
TISSUE EXPANDER MX 400CC (Breast) ×2 IMPLANT
TOWEL OR 17X24 6PK STRL BLUE (TOWEL DISPOSABLE) ×7 IMPLANT
TRAY FOLEY BAG SILVER LF 14FR (SET/KITS/TRAYS/PACK) IMPLANT
TRAY FOLEY BAG SILVER LF 16FR (SET/KITS/TRAYS/PACK) IMPLANT
TUBE CONNECTING 20X1/4 (TUBING) ×3 IMPLANT
UNDERPAD 30X30 (UNDERPADS AND DIAPERS) ×3 IMPLANT
YANKAUER SUCT BULB TIP NO VENT (SUCTIONS) ×3 IMPLANT

## 2016-07-01 NOTE — Discharge Instructions (Signed)

## 2016-07-01 NOTE — Progress Notes (Signed)
Assisted Dr. Ossey with left, ultrasound guided, pectoralis block. Side rails up, monitors on throughout procedure. See vital signs in flow sheet. Tolerated Procedure well. 

## 2016-07-01 NOTE — Anesthesia Procedure Notes (Signed)
Procedure Name: LMA Insertion Date/Time: 07/01/2016 1:53 PM Performed by: Toula Moos L Pre-anesthesia Checklist: Patient identified, Emergency Drugs available, Suction available, Patient being monitored and Timeout performed Patient Re-evaluated:Patient Re-evaluated prior to inductionOxygen Delivery Method: Circle system utilized Preoxygenation: Pre-oxygenation with 100% oxygen Intubation Type: IV induction Ventilation: Mask ventilation without difficulty LMA: LMA inserted LMA Size: 4.0 Number of attempts: 1 Airway Equipment and Method: Bite block Placement Confirmation: positive ETCO2 Tube secured with: Tape Dental Injury: Teeth and Oropharynx as per pre-operative assessment

## 2016-07-01 NOTE — Anesthesia Postprocedure Evaluation (Signed)
Anesthesia Post Note  Patient: Aoife HRISTINA STRIANO  Procedure(s) Performed: Procedure(s) (LRB): LEFT NIPPLE SPARING MASTECTOMY (Left) BREAST RECONSTRUCTION WITH PLACEMENT OF TISSUE EXPANDER AND  ALLODERM (Left)  Patient location during evaluation: PACU Anesthesia Type: General Level of consciousness: awake and alert Pain management: pain level controlled Vital Signs Assessment: post-procedure vital signs reviewed and stable Respiratory status: spontaneous breathing, nonlabored ventilation, respiratory function stable and patient connected to nasal cannula oxygen Cardiovascular status: blood pressure returned to baseline and stable Postop Assessment: no signs of nausea or vomiting Anesthetic complications: no    Last Vitals:  Vitals:   07/01/16 1700 07/01/16 1715  BP: 139/80 135/76  Pulse: 84 88  Resp: 18 (!) 21  Temp:      Last Pain:  Vitals:   07/01/16 1715  PainSc: 2                  Cael Worth DAVID

## 2016-07-01 NOTE — H&P (Signed)
Sabrina Tapia is an 65 y.o. female.   Chief Complaint: left breast cancer HPI:  27 yof returns to discuss possible surgery. I recommended mastectomy and she was interested in nsm. she has seen plastics to discuss reconstruction and would like to talk to them again.she has no new issues  Past Medical History:  Diagnosis Date  . Anxiety   . Arthritis   . Asthma    allergy induced  . Cancer ([place]) 2017   bil breast cancer  . Cervical polyp   . Detrusor instability   . GERD (gastroesophageal reflux disease)   . Heart burn   . Hemorrhoids   . Hypertonicity of bladder   . Joint pain   . LGSIL (low grade squamous intraepithelial dysplasia) 2001   colpo biopsy negative, negative paps since  . Osteopenia 02/2016   T score -1.3 FRAX 8.3%/0.7% stable from prior DEXA    Past Surgical History:  Procedure Laterality Date  . INCISION AND DRAINAGE Left 06/02/2016   Procedure: DRAINAGE of left axilla seroma;  Surgeon: Rolm Bookbinder, MD;  Location: [facility];  Service: General;  Laterality: Left;  . RADIOACTIVE SEED GUIDED MASTECTOMY WITH AXILLARY SENTINEL LYMPH NODE BIOPSY Right 03/26/2016   Procedure: RIGHT BREAST LUMPECTOMY WITH RADIOACTIVE SEED AND SENTINEL LYMPH NODE BIOPSY;  Surgeon: Rolm Bookbinder, MD;  Location: [facility];  Service: General;  Laterality: Right;  . RADIOACTIVE SEED GUIDED MASTECTOMY WITH AXILLARY SENTINEL LYMPH NODE BIOPSY Left 05/20/2016   Procedure: LEFT BREAST RADIOACTIVE SEED (two seeds) GUIDED LUMPECTOMY WITH LEFT AXILLARY SENTINEL LYMPH NODE BIOPSY AND LEFT SEED TARGETED AXILLARY LYMPH NODE EXCISION;  Surgeon: Rolm Bookbinder, MD;  Location: [facility];  Service: General;  Laterality: Left;  . RE-EXCISION OF BREAST CANCER,SUPERIOR MARGINS Right 05/20/2016   Procedure: RE-EXCISION OF RIGHT BREAST MARGIN;  Surgeon: Rolm Bookbinder, MD;  Location: [facility];  Service: General;  Laterality: Right;  .  RE-EXCISION OF BREAST CANCER,SUPERIOR MARGINS Left 06/02/2016   Procedure: RE-EXCISION OF BREAST CANCER,SUPERIOR MARGINS;  Surgeon: Rolm Bookbinder, MD;  Location: [facility];  Service: General;  Laterality: Left;    Family History  Problem Relation Age of Onset  . Hypertension Mother   . Heart disease Mother   . Lung disease Mother   . Hypertension Father   . Cancer Father     colon  . Heart disease Father   . Lung cancer Father   . Breast cancer Paternal Grandmother     Age 11's  . Hypertension Brother   . Hyperlipidemia Brother   . Diabetes Maternal Grandfather    Social History:  reports that she quit smoking about 31 years ago. She has never used smokeless tobacco. She reports that she drinks about 7.2 oz of alcohol per week . She reports that she does not use drugs.  Allergies:  Allergies  Allergen Reactions  . Adhesive [Tape] Rash    Medications Prior to Admission  Medication Sig Dispense Refill  . ALPRAZolam (XANAX) 0.25 MG tablet Take 1 tablet (0.25 mg total) by mouth at bedtime as needed. for sleep 30 tablet 1  . beclomethasone (QVAR) 40 MCG/ACT inhaler INHALE 2 PUFFS BY MOUTH TWICE DAILY TO PREVENT COUGH OR WHEEZING. RINSE, GARGLE AND SPIT AFTER USE 8.7 g 4  . Calcium Carbonate-Vitamin D (CALCIUM + D PO) Take by mouth.    . cetirizine (ZYRTEC) 10 MG tablet Take 10 mg by mouth.    . docusate sodium (COLACE) 50  MG capsule Take 50 mg by mouth.    . fluticasone (FLONASE) 50 MCG/ACT nasal spray Place 2 sprays into both nostrils daily. 16 g 5  . PRILOSEC OTC 20 MG tablet Reported on 12/14/2015    . Probiotic Product (PROBIOTIC DAILY PO) Take by mouth.    . solifenacin (VESICARE) 5 MG tablet Take 1 tablet (5 mg total) by mouth daily. 30 tablet 12  . tamoxifen (NOLVADEX) 20 MG tablet Take 20 mg by mouth daily.    . Turmeric 450 MG CAPS Take by mouth.    . venlafaxine XR (EFFEXOR-XR) 75 MG 24 hr capsule Take 1 capsule (75 mg total) by mouth daily with  breakfast. 90 capsule 12  . acetaminophen (TYLENOL) 500 MG tablet Take 1,000 mg by mouth every 6 (six) hours as needed.    Marland Kitchen albuterol (PROAIR HFA) 108 (90 Base) MCG/ACT inhaler Inhale 2 puffs into the lungs every 4 (four) hours as needed for wheezing or shortness of breath. 1 Inhaler 1  . oxyCODONE-acetaminophen (PERCOCET) 10-325 MG tablet Take 1 tablet by mouth every 6 (six) hours as needed for pain. (Patient not taking: Reported on 06/11/2016) 20 tablet 0    Results for orders placed or performed during the hospital encounter of 07/01/16 (from the past 48 hour(s))  Hemoglobin-hemacue, POC     Status: None   Collection Time: 07/01/16 12:40 PM  Result Value Ref Range   Hemoglobin 14.4 12.0 - 15.0 g/dL   No results found.  ROS Negative  Blood pressure 126/79, pulse 77, resp. rate 13, height 5\' 4"  (1.626 m), weight 64.6 kg (142 lb 5 oz), SpO2 97 %. Physical Exam  cv rrr pulm clear bilaterally Right breast and left breast healing well  Assessment & Plan  POSTOPERATIVE STATE [phone]) Story: Recommended again left nsm with reconstruction. I think we can do with im incision. she would like to talk to plastics again prior to scheduling. I think she should see rad onc at this point as well. will discuss timing of surgery  Sharla Tankard, MD 07/01/2016, 1:44 PM

## 2016-07-01 NOTE — Op Note (Signed)
Preoperative diagnosis: left breast cancer stage II, s/p lumpectomy with continued positive margins Postoperative diagnosis: same as above Procedure: left nipple sparing mastectomy Surgeon: Dr Serita Grammes Asst: Dr Irene Limbo Estimated blood loss: 50 mL Drains: Per plastic surgery Specimens: #1 left nipple sparing mastectomy marked short stitch superior, long stitch lateral, double stitch at nipple areola #2 left nipple and retroareolar specimen marked short superior, long lateral, double Marks nipple areola margin #3 superior lumpectomy cavity excision marked short superior, long lateral, double deep Complications: None Sponge and needle count was correct 2 at end of operation Disposition case turned over to plastic surgery for reconstruction  Indications: This is a 65 year old female who had a right breast cancer that was treated. During this evaluation she also was noted to have a left breast cancer by MRI as well as an additional cancer by ultrasound. I have attempted to do breast conservation therapy on the left side with continued positive margins with lumpectomy. Due to the concern for following this side as well as the continued positive margins I discussed with her performing a nipple sparing mastectomy along with reconstruction with plastic surgery. She understands that she will get radiation on this side due to the positive lymph node. We discussed options for the lymph node to include axillary lymph node dissection and radiation and elected to proceed without any further surgery.  Procedure: After informed consent was obtained the patient was taken to the operative room. She had been given Ancef. SCDs were in place. She was in place under general anesthesia with an LMA. Her left breast was then prepped and draped in the standard sterile surgical fashion. A surgical timeout was then performed.  I made an inframammary incision that was 8 cm from the sternum. The incision was 11  cm in length and placed in a position to hide it. I then released the posterior breast including the pectoralis fascia on the pectoralis muscle. This was somewhat difficult in the region of the old scar. This was done to the upper parasternal region, clavicle, and the latissimus laterally. I then released the anterior portion of the breast from the skin. I used scissors to release this from the nipple areolar complex as well as at the scar. This was done in the same area. There were still some small areas of tissue that were present on the skin. This was very difficult to remove initially because of the scarring. I then removed the silk that there was only skin and some subcutaneous tissue remaining. Hemostasis was observed. The flaps and the nipple areolar both viable. I then turned the case over to plastic surgery for reconstruction.

## 2016-07-01 NOTE — Op Note (Signed)
Operative Note   DATE OF OPERATION: 12.5.17  LOCATION: [facility]-observation  SURGICAL DIVISION: Plastic Surgery  PREOPERATIVE DIAGNOSES:  1. Left breast cancer upper outer quadrant  POSTOPERATIVE DIAGNOSES:  same  PROCEDURE:  1. Left breast reconstruction with tissue expander 2. Left breast reconstruction with acellular dermis (Alloderm) 45 cm2  SURGEON: Irene Limbo MD MBA  ASSISTANT: none  ANESTHESIA:  General.   EBL: 50 ml for entire case  COMPLICATIONS: None immediate.   INDICATIONS FOR PROCEDURE:  The patient, Sabrina Tapia, is a 65 y.o. female born on 09/22/1950, is here for left nipple sparing mastectomy with immediate expander based reconstruction. She has undergone prior lumpectomy with reexcision with continued positive margins on this side.    FINDINGS: Natrelle 133MX-12-T 400 ml tissue expander placed, initial fill volume 200 ml. SN [id number]  DESCRIPTION OF PROCEDURE:  The patient was marked with the patient in the preoperative area to mark sternal notch, chest midline, anterior axillary lines and inframammary folds. The patient was taken to the operating room. SCDs were placed and IV antibiotics were given. The patient's operative site was prepped and draped in a sterile fashion. A time out was performed and all information was confirmed to be correct. Following completion of mastectomy, the inferior insertions of pectoralis major muscle were divided taking care to preserve most medial and sternal insertions. Submuscular dissection completed toward clavicle.The cavity was irrigated with solution containing Ancef, gentamicin, and bacitracin. Hemostasis was ensured. A 19 Fr drain was placed and secured to skin with 2-0 nylon. Cavity irrigated with Betadine. The tissue expander was prepared and placed in submuscular position. The expander was secured to chest wall with a 3-0 vicryl. The serratus muscle was elevated laterally, but was deemed insufficient for  coverage lower pole expander. Acellular dermiswas perforated and sewn to inferior border of pectoralis major with 3-0 vicryl. The inferior border of the acellular dermis was inset to chest wall and inframammary fold fascia . Laterally the acellular dermis was secured to elevated serratus. 0- vicryl suture was used to advance the posterior axillary line soft tissue anteriorly and secured to serratus and acellular dermis. The axillary soft tissue of mastectomy flap was advanced inferiorly and secured to pectoralis muscle with 0-vicryl.The incision was closed with 3-0 vicryl in fascial layer and 4-0 vicryl in dermis. Skin closure completed with 4-0 monocryl subcuticular and tissue adhesive. The port was accessed and filled to 200 ml bilaterally. The patient was brought to upright position and the skin flaps were redraped so that NAC was symmetric from the sternal notch and midline. Transparent, adherent dressings applied. Dry dressing and breast binder applied.  The patient was allowed to wake from anesthesia, extubated and taken to the recovery room in satisfactory condition.   SPECIMENS: none  DRAINS: 19 Fr JP in left breast reconstruction  Irene Limbo, MD [facility] Plastic & Reconstructive Surgery [phone], pin [phone]

## 2016-07-01 NOTE — Interval H&P Note (Signed)
History and Physical Interval Note:  07/01/2016 1:46 PM  Sabrina Tapia  has presented today for surgery, with the diagnosis of LEFT BREAST CANCER  The various methods of treatment have been discussed with the patient and family. After consideration of risks, benefits and other options for treatment, the patient has consented to  Procedure(s): LEFT NIPPLE SPARING MASTECTOMY (Left) BREAST RECONSTRUCTION WITH PLACEMENT OF TISSUE EXPANDER AND  POSSIBLE ALLODERM (Left) as a surgical intervention .  The patient's history has been reviewed, patient examined, no change in status, stable for surgery.  I have reviewed the patient's chart and labs.  Questions were answered to the patient's satisfaction.     Emaree Chiu

## 2016-07-01 NOTE — Anesthesia Preprocedure Evaluation (Signed)
Anesthesia Evaluation  Patient identified by MRN, date of birth, ID band Patient awake    Reviewed: Allergy & Precautions, NPO status , Patient's Chart, lab work & pertinent test results  Airway Mallampati: I  TM Distance: >3 FB Neck ROM: Full    Dental   Pulmonary asthma , former smoker,    Pulmonary exam normal        Cardiovascular Normal cardiovascular exam     Neuro/Psych    GI/Hepatic GERD  Medicated and Controlled,  Endo/Other    Renal/GU      Musculoskeletal   Abdominal   Peds  Hematology   Anesthesia Other Findings   Reproductive/Obstetrics                             Anesthesia Physical Anesthesia Plan  ASA: II  Anesthesia Plan: General   Post-op Pain Management:    Induction: Intravenous  Airway Management Planned: Oral ETT  Additional Equipment:   Intra-op Plan:   Post-operative Plan: Extubation in OR  Informed Consent: I have reviewed the patients History and Physical, chart, labs and discussed the procedure including the risks, benefits and alternatives for the proposed anesthesia with the patient or authorized representative who has indicated his/her understanding and acceptance.     Plan Discussed with: CRNA and Surgeon  Anesthesia Plan Comments:         Anesthesia Quick Evaluation

## 2016-07-01 NOTE — Interval H&P Note (Signed)
History and Physical Interval Note:  07/01/2016 12:25 PM  Sabrina Tapia  has presented today for surgery, with the diagnosis of LEFT BREAST CANCER  The various methods of treatment have been discussed with the patient and family. After consideration of risks, benefits and other options for treatment, the patient has consented to  Procedure(s): LEFT NIPPLE SPARING MASTECTOMY (Left) BREAST RECONSTRUCTION WITH PLACEMENT OF TISSUE EXPANDER AND  POSSIBLE ALLODERM (Left) as a surgical intervention .  The patient's history has been reviewed, patient examined, no change in status, stable for surgery.  I have reviewed the patient's chart and labs.  Questions were answered to the patient's satisfaction.     [name]

## 2016-07-01 NOTE — Transfer of Care (Signed)
Immediate Anesthesia Transfer of Care Note  Patient: Sabrina Tapia  Procedure(s) Performed: Procedure(s): LEFT NIPPLE SPARING MASTECTOMY (Left) BREAST RECONSTRUCTION WITH PLACEMENT OF TISSUE EXPANDER AND  ALLODERM (Left)  Patient Location: PACU  Anesthesia Type:GA combined with regional for post-op pain  Level of Consciousness: sedated  Airway & Oxygen Therapy: Patient Spontanous Breathing and Patient connected to face mask oxygen  Post-op Assessment: Report given to RN and Post -op Vital signs reviewed and stable  Post vital signs: Reviewed and stable  Last Vitals:  Vitals:   07/01/16 1322 07/01/16 1323  BP:    Pulse: 79 77  Resp: 10 13    Last Pain: There were no vitals filed for this visit.       Complications: No apparent anesthesia complications

## 2016-07-01 NOTE — Anesthesia Procedure Notes (Signed)
Anesthesia Regional Block:  Pectoralis block  Pre-Anesthetic Checklist: ,, timeout performed, Correct Patient, Correct Site, Correct Laterality, Correct Procedure, Correct Position, site marked, Risks and benefits discussed,  Surgical consent,  Pre-op evaluation,  At surgeon's request and post-op pain management  Laterality: Left  Prep: chloraprep       Needles:  Injection technique: Single-shot     Needle Length: 9cm 9 cm Needle Gauge: 21 and 21 G    Additional Needles:  Procedures: ultrasound guided (picture in chart) Pectoralis block Narrative:  Start time: 07/01/2016 1:15 PM End time: 07/01/2016 2:25 PM Injection made incrementally with aspirations every 5 mL.  Performed by: Personally  Anesthesiologist: Lillia Abed  Additional Notes: Monitors applied. Patient sedated. Sterile prep and drape,hand hygiene and sterile gloves were used. Relevant anatomy identified.Needle position confirmed.Local anesthetic injected incrementally after negative aspiration. Local anesthetic spread visualized. Vascular puncture avoided. No complications. Image printed for medical record.The patient tolerated the procedure well.

## 2016-07-02 ENCOUNTER — Encounter: Payer: Self-pay | Admitting: Physical Therapy

## 2016-07-02 ENCOUNTER — Encounter (HOSPITAL_BASED_OUTPATIENT_CLINIC_OR_DEPARTMENT_OTHER): Payer: Self-pay | Admitting: General Surgery

## 2016-07-02 DIAGNOSIS — C50412 Malignant neoplasm of upper-outer quadrant of left female breast: Secondary | ICD-10-CM | POA: Diagnosis not present

## 2016-07-03 ENCOUNTER — Other Ambulatory Visit: Payer: Self-pay

## 2016-07-03 MED ORDER — FLUTICASONE PROPIONATE 50 MCG/ACT NA SUSP: 2.0000 | Freq: Every day | NASAL | 5 refills | Status: DC

## 2016-07-04 ENCOUNTER — Ambulatory Visit: Payer: BC Managed Care – PPO

## 2016-07-04 ENCOUNTER — Ambulatory Visit: Payer: BC Managed Care – PPO | Admitting: Radiation Oncology

## 2016-07-07 ENCOUNTER — Encounter: Payer: Self-pay | Admitting: Physical Therapy

## 2016-07-08 ENCOUNTER — Other Ambulatory Visit: Payer: Self-pay | Admitting: Oncology

## 2016-07-08 NOTE — Progress Notes (Signed)
I reviewed the pathology on Sabrina Tapia. Even though the pathology says there is a margin that is broadly positive, actually this is a negative margin as noted in a node at the bottom of the report. I discussed this with Dr. Donne Hazel and he tells me this is the top portion of a prior biopsy and that is why it is "positive" on that reading. Margins are in fact negative.  I'm recommending capecitabine sensitization to be given with the radiation given her high risk of local recurrence. Given her Mammaprint results I do not think adjuvant radiation would add much at all to her risk reduction.

## 2016-07-09 ENCOUNTER — Encounter: Payer: Self-pay | Admitting: Physical Therapy

## 2016-07-09 NOTE — Progress Notes (Signed)
error 

## 2016-07-09 NOTE — Progress Notes (Signed)
Location of Breast Cancer: Right Breast, Left Breast  Histology per Pathology Report:  03/06/16 Diagnosis Breast, right, needle core biopsy INVASIVE DUCTAL CARCINOMA WITH CALCIFICATIONS, GRADE 1 DUCTAL CARCINOMA IN SITU IS PRESENT  Receptor Status: ER(95%), PR (95%), Her2-neu (Neg), Ki-(12%)  03/26/16 Diagnosis 1. Breast, lumpectomy, Right - INVASIVE AND IN SITU DUCTAL CARCINOMA, 1.4 CM. - MARGINS NOT INVOLVED. - PREVIOUS BIOPSY SITE. - FIBROCYSTIC CHANGES WITH CALCIFICATIONS. 2. Lymph node, sentinel, biopsy, Right Axillary - METASTATIC DUCTAL CARCINOMA IN ONE NODE (1/1). 3. Breast, excision, Right additional Medial Margin - INVASIVE LOBULAR CARCINOMA, 0.6 CM. - LOBULAR CARCINOMA IN SITU - INVASIVE LOBULAR CARCINOMA FOCALLY INVOLVES FINAL MEDIAL MARGIN. - DUCTAL CARCINOMA IN SITU, 0.2 CM. - FIBROCYSTIC CHANGES WITH CALCIFICATIONS. 4. Breast, excision, Right additional Inferior Margin - FIBROCYSTIC CHANGES WITH CALCIFICATIONS. - FINAL INFERIOR MARGIN CLEAR. 5. Breast, excision, Right additional Superior Margin - FOCAL LOBULAR NEOPLASIA (ATYPICAL LOBULAR HYPERPLASIA). - FIBROCYSTIC CHANGES WITH CALCIFICATIONS. - FINAL SUPERIOR MARGIN CLEAR.  Receptor status: ER (100%), PR (100%), Her2 (NEG), Ki67 (12%)  04/09/16 Diagnosis 1. Breast, left, needle core biopsy - INVASIVE DUCTAL CARCINOMA. - DUCTAL CARCINOMA IN SITU. - SEE COMMENT. 2. Lymph node, needle/core biopsy, right breast - ONE BENIGN LYMPH NODE WITH NO TUMOR SEEN (0/1).  04/11/16 Diagnosis Breast, left, needle core biopsy, UOQ - INVASIVE DUCTAL CARCINOMA. - DUCTAL CARCINOMA IN SITU WITH ASSOCIATED CALCIFICATIONS.  Receptor Status: ER (10%), PR (NEG), Ki67 (2%), Her2 (NEG)  04/14/16 Diagnosis Lymph node, needle/core biopsy, left axillary - POSITIVE FOR METASTATIC DUCTAL CARCINOMA.  05/20/16 Diagnosis 1. Breast, excision, Right additional Medial Margin - FIBROCYSTIC CHANGES WITH CALCIFICATIONS. - LOBULAR  NEOPLASIA (ATYPICAL LOBULAR HYPERPLASIA). - PREVIOUS BIOPSY SITE REACTION. - ONE BENIGN LYMPH NODE (0/1). - FINAL MEDIAL MARGIN CLEAR. 2. Lymph nodes, regional resection, Left Axillary - METASTATIC CARCINOMA IN TWO OF FOUR LYMPH NODES (2/4). 3. Breast, lumpectomy, Left Inferior - INVASIVE AND IN SITU DUCTAL CARCINOMA, 1.5 CM - INVASIVE CARCINOMA 0.1 CM FROM POSTERIOR, SUPERIOR AND MEDIAL MARGINS. - PREVIOUS BIOPSY SITE. 4. Breast, lumpectomy, Left Superior - INVASIVE AND IN SITU DUCTAL CARCINOMA. - INVASIVE CARCINOMA FOCALLY LESS THAN 0.1 CM FROM THE INFERIOR MARGIN AND 0.1 CM FROM POSTERIOR MARGIN. - PREVIOUS BIOPSY SITE REACTION. 5. Breast, excision, Left additional Superior Margin - INVASIVE AND IN SITU DUCTAL CARCINOMA, 1.5 CM. - INVASIVE CARCINOMA FOCALLY INVOLVES FINAL SUPERIOR MARGIN. 6. Breast, excision, Left additional Lateral Margin - MICROSCOPIC FOCI OF INVASIVE AND IN SITU DUCTAL CARCINOMA. - INVASIVE AND IN SITU DUCTAL CARCINOMA 0.4 CM FROM FINAL LATERAL MARGIN.  06/02/16 Diagnosis Breast, excision, Additional Left Superior Margin - INVASIVE DUCTAL CARCINOMA. - DUCTAL CARCINOMA IN SITU WITH CALCIFICATIONS. - INVASIVE CARCINOMA IS FOCALLY PRESENT AT THE NEW MARGIN IN SEVERAL AREAS.  07/01/16 Diagnosis 1. Breast, simple mastectomy, Left Nipple Sparing - INVASIVE DUCTAL CARCINOMA, MULTIPLE FOCI, GRADE II/III, SPANNING AT LEAST 0.6 CM. - DUCTAL CARCINOMA IN SITU, LOW GRADE. - LYMPHOVASCULAR INVASION IS IDENTIFIED. - THE SURGICAL RESECTION MARGINS OF SPECIMEN #1 ARE NEGATIVE FOR CARCINOMA. - THERE IS NO EVIDENCE OF CARCINOMA IN ONE OF ONE LYMPH NODE (0/1). 2. Nipple Biopsy, Left - INVASIVE DUCTAL CARCINOMA GRADE II/III, MULTIPLE FOCI, THE LARGEST SPANS AT LEAST 0.7 CM. - INVASIVE CARCINOMA IS FOCALLY 0.1 CM TO THE ANTERIOR MARGIN OF SPECIMEN # 2. - LOBULAR NEOPLASIA (ATYPICAL LOBULAR HYPERPLASIA). 3. Breast, excision, Left Superior Margin - INVASIVE DUCTAL CARCINOMA,  MULTIPLE FOCI, GRADE II/III, SPANNING AT LEAST 0.8 CM. - DUCTAL CARCINOMA IN SITU, LOW GRADE. - LYMPHOVASCULAR INVASION  IS IDENTIFIED. - INVASIVE CARCINOMA IS BROADLY PRESENT AT THE INFERIOR MARGIN OF SPECIMEN # 3. - LOBULAR NEOPLASIA (LOBULAR CARCINOMA IN SITU).  Did patient present with symptoms or was this found on screening mammography?: The Right Breast cancer was found on a screening mammogram. The Left Breast Cancer was found on a post op MRI ordered by Dr. Donne Hazel after her Right Lumpectomy.   Past/Anticipated interventions by surgeon, if any: 03/26/16 Procedure: 1. Right breast seed guided lumpectomy  2. Rightaxillary sentinel nodebiopsy Surgeon Dr Serita Grammes  05/20/16 Procedure: 1. Right breast re-excision lumpectomy of medial margin 2. Left breast lumpectomy seed guided times two 3. Left targeted axillary dissection Surgeon Dr Serita Grammes  06/02/16 Procedure: excision of superior margin left breast Surgeon Dr Serita Grammes  07/01/16 Procedure: left nipple sparing mastectomy Surgeon: Dr Serita Grammes Asst: Dr Irene Limbo  07/01/16 PROCEDURE:  1. Left breast reconstruction with tissue expander 2. Left breast reconstruction with acellular dermis (Alloderm) 45 cm2 SURGEON: Irene Limbo MD MBA  Past/Anticipated interventions by medical oncology, if any: Dr. Jana Hakim last saw 06/06/16 - Radiation therapy to follow scheduled masectomy 07/01/16  -Tamoxifen started 04/18/2016 - Genetics consultation being scheduled  Lymphedema issues, if any:  She denies lymphedema to bilateral Breasts. She has good arm mobility in both arms.   Pain issues, if any:  She reports discomfort to her Left surgery area.   SAFETY ISSUES:  Prior radiation? No  Pacemaker/ICD? No  Possible current pregnancy? No  Is the patient on methotrexate? No  Current Complaints / other details:    BP 138/72   Pulse 73   Temp 98.5 F (36.9 C)   Ht 5' 4"  (1.626 m)   Wt 147  lb 3.2 oz (66.8 kg)   SpO2 100% Comment: room air  BMI 25.27 kg/m    Wt Readings from Last 3 Encounters:  07/15/16 147 lb 3.2 oz (66.8 kg)  07/01/16 142 lb 5 oz (64.6 kg)  06/06/16 146 lb (66.2 kg)      Janeil Schexnayder, Stephani Police, RN 07/09/2016,2:56 PM

## 2016-07-14 ENCOUNTER — Encounter: Payer: Self-pay | Admitting: Physical Therapy

## 2016-07-15 ENCOUNTER — Encounter: Payer: Self-pay | Admitting: Radiation Oncology

## 2016-07-15 ENCOUNTER — Ambulatory Visit
Admission: RE | Admit: 2016-07-15 | Discharge: 2016-07-15 | Disposition: A | Discharge status: Home / Self Care | Payer: BC Managed Care – PPO | Source: Ambulatory Visit | Attending: Radiation Oncology | Admitting: Radiation Oncology

## 2016-07-15 VITALS — BP 138/72 | HR 73 | Temp 98.5°F | Ht 64.0 in | Wt 147.2 lb

## 2016-07-15 DIAGNOSIS — C50412 Malignant neoplasm of upper-outer quadrant of left female breast: Secondary | ICD-10-CM | POA: Diagnosis not present

## 2016-07-15 DIAGNOSIS — Z79899 Other long term (current) drug therapy: Secondary | ICD-10-CM | POA: Diagnosis not present

## 2016-07-15 DIAGNOSIS — Z170 Estrogen receptor positive status [ER+]: Secondary | ICD-10-CM

## 2016-07-15 DIAGNOSIS — Z79810 Long term (current) use of selective estrogen receptor modulators (SERMs): Secondary | ICD-10-CM | POA: Diagnosis not present

## 2016-07-15 DIAGNOSIS — Z803 Family history of malignant neoplasm of breast: Secondary | ICD-10-CM | POA: Diagnosis not present

## 2016-07-15 DIAGNOSIS — Z87891 Personal history of nicotine dependence: Secondary | ICD-10-CM | POA: Diagnosis not present

## 2016-07-15 DIAGNOSIS — C773 Secondary and unspecified malignant neoplasm of axilla and upper limb lymph nodes: Secondary | ICD-10-CM | POA: Diagnosis not present

## 2016-07-15 DIAGNOSIS — C50411 Malignant neoplasm of upper-outer quadrant of right female breast: Secondary | ICD-10-CM

## 2016-07-15 DIAGNOSIS — Z510 Encounter for antineoplastic radiation therapy: Secondary | ICD-10-CM | POA: Diagnosis not present

## 2016-07-15 DIAGNOSIS — M8580 Other specified disorders of bone density and structure, unspecified site: Secondary | ICD-10-CM | POA: Diagnosis not present

## 2016-07-15 DIAGNOSIS — Z7951 Long term (current) use of inhaled steroids: Secondary | ICD-10-CM | POA: Diagnosis not present

## 2016-07-15 DIAGNOSIS — Z801 Family history of malignant neoplasm of trachea, bronchus and lung: Secondary | ICD-10-CM | POA: Diagnosis not present

## 2016-07-15 DIAGNOSIS — J45909 Unspecified asthma, uncomplicated: Secondary | ICD-10-CM | POA: Diagnosis not present

## 2016-07-15 DIAGNOSIS — M1990 Unspecified osteoarthritis, unspecified site: Secondary | ICD-10-CM | POA: Diagnosis not present

## 2016-07-15 DIAGNOSIS — Z9013 Acquired absence of bilateral breasts and nipples: Secondary | ICD-10-CM | POA: Diagnosis not present

## 2016-07-15 DIAGNOSIS — Z8249 Family history of ischemic heart disease and other diseases of the circulatory system: Secondary | ICD-10-CM | POA: Diagnosis not present

## 2016-07-15 DIAGNOSIS — K219 Gastro-esophageal reflux disease without esophagitis: Secondary | ICD-10-CM | POA: Diagnosis not present

## 2016-07-15 NOTE — Progress Notes (Addendum)
Radiation Oncology         [phone] ________________________________  Initial Outpatient Consultation  Name: Sabrina Tapia MRN: [id number]  Date: 07/15/2016  DOB: 11/29/1950  XT:GGYIR,SWNIOE NEVILL, MD  Rolm Bookbinder, MD   REFERRING PHYSICIAN: Rolm Bookbinder, MD  DIAGNOSIS:    ICD-9-CM ICD-10-CM   1. Malignant neoplasm of upper-outer quadrant of left breast in female, estrogen receptor positive ([place]) 174.4 C50.412    V86.0 Z17.0    Stage IIA pT1c, pN1a, M0 Right Breast UOQ Invasive Ductal Carcinoma In Situ with Calcifications, ER 95% / PR 95% / Her2-, Grade 1  Stage IIB Left Breast pT2, pN1a, M0 UOQ Invasive Ductal Carcinoma with Calcifications In Situ, ER 10% / PR- / Her2-, Grade 2  CHIEF COMPLAINT: Here to discuss management of bilateral breast cancers.  HISTORY OF PRESENT ILLNESS::Sabrina Tapia is a 66 y.o. female who presented with architectural distortion in the UOQ of right breast on screening mammogram in August 2017. Ultrasound measured a 0.5 cm mass in the UOQ and right axilla was sonographically benign.  Biopsy showed invasive ductal carcinoma with characteristics as described above in the diagnosis.  Patient underwent a right lumpectomy with sentinel lymph node biopsy on 03/26/16. This revealed a 1.4 cm mass with no margin involvement. There was also fibrocystic changes with calcifications. Sentinel lymph node biopsy was positive for metastatic ductal carcinoma (1/1). Medial margin on right breast excision revealed 0.6 cm invasive lobular carcinoma. The margin is positive per path report.  Post lumpectomy MRI revealed 2 enhancing masses posterior to the lumpectomy cavity spanning 1 cm. Biopsy revealed benign  tissue. MRI also showed 1.3 cm enhancing mass on the central left breast. Biopsy of this mass on 04/09/16 revealed invasive ductal carcinoma. Additional biopsy was performed on a second left breast mass that was not seen on the MRI. This revealed  invasive ductal carcinoma with associated calcifications ER +/ PR- / Her2 -. Biopsy on 04/14/16 of left axilla lymph node revealed positive for metastatic ductal carcinoma.  Patient underwent an excision of superior margin of left breast on 06/02/16. This revealed invasive carcinoma focally present at new margin in several areas with calcifications.  Ultimately after multiple attempts at breast conserving surgery patient underwent a left nipple sparing mastectomy with sentinel lymph node biopsy on 07/01/16. This revealed additional foci positive for malignancy. She has had 2 of 5 lymph nodes positive over her left sided surgeries.  After mastectomy there are still positive margins. Following the mastectomy, the patient had a left breast reconstruction with tissue expander and left breast reconstruction with acellular dermis (alloderm) 45 cm2.  Of note, mammaprint bilateral is low risk, and genetics is pending. Patient last saw Dr. Jana Hakim on 06/06/16. Tamoxifen was started on 04/18/16.  Patient reports TMJ with associated headaches, seasonal allergies, occasional constipation, and occasional diarrhea. Patient denies lymphedema to bilateral breasts and good mobility in both arms. She reports discomfort to her left surgery area.  PREVIOUS RADIATION THERAPY: No  PAST MEDICAL HISTORY:  has a past medical history of Arthritis; Asthma; Cancer ([place]) (2017); Cervical polyp; Detrusor instability; GERD (gastroesophageal reflux disease); Heart burn; Hemorrhoids; Hypertonicity of bladder; Joint pain; LGSIL (low grade squamous intraepithelial dysplasia) (2001); and Osteopenia (02/2016).    PAST SURGICAL HISTORY: Past Surgical History:  Procedure Laterality Date  . BREAST RECONSTRUCTION WITH PLACEMENT OF TISSUE EXPANDER AND FLEX HD (ACELLULAR HYDRATED DERMIS) Left 07/01/2016   Procedure: BREAST RECONSTRUCTION WITH PLACEMENT OF TISSUE EXPANDER AND  ALLODERM;  Surgeon: Irene Limbo, MD;  Location: [place];  Service: Plastics;  Laterality: Left;  . INCISION AND DRAINAGE Left 06/02/2016   Procedure: DRAINAGE of left axilla seroma;  Surgeon: Rolm Bookbinder, MD;  Location: [place];  Service: General;  Laterality: Left;  . NIPPLE SPARING MASTECTOMY Left 07/01/2016   Procedure: LEFT NIPPLE SPARING MASTECTOMY;  Surgeon: Rolm Bookbinder, MD;  Location: [place];  Service: General;  Laterality: Left;  . RADIOACTIVE SEED GUIDED MASTECTOMY WITH AXILLARY SENTINEL LYMPH NODE BIOPSY Right 03/26/2016   Procedure: RIGHT BREAST LUMPECTOMY WITH RADIOACTIVE SEED AND SENTINEL LYMPH NODE BIOPSY;  Surgeon: Rolm Bookbinder, MD;  Location: [place];  Service: General;  Laterality: Right;  . RADIOACTIVE SEED GUIDED MASTECTOMY WITH AXILLARY SENTINEL LYMPH NODE BIOPSY Left 05/20/2016   Procedure: LEFT BREAST RADIOACTIVE SEED (two seeds) GUIDED LUMPECTOMY WITH LEFT AXILLARY SENTINEL LYMPH NODE BIOPSY AND LEFT SEED TARGETED AXILLARY LYMPH NODE EXCISION;  Surgeon: Rolm Bookbinder, MD;  Location: [place];  Service: General;  Laterality: Left;  . RE-EXCISION OF BREAST CANCER,SUPERIOR MARGINS Right 05/20/2016   Procedure: RE-EXCISION OF RIGHT BREAST MARGIN;  Surgeon: Rolm Bookbinder, MD;  Location: [place];  Service: General;  Laterality: Right;  . RE-EXCISION OF BREAST CANCER,SUPERIOR MARGINS Left 06/02/2016   Procedure: RE-EXCISION OF BREAST CANCER,SUPERIOR MARGINS;  Surgeon: Rolm Bookbinder, MD;  Location: [place];  Service: General;  Laterality: Left;    FAMILY HISTORY: family history includes Breast cancer in her paternal grandmother; Cancer in her father; Diabetes in her maternal grandfather; Heart disease in her father and mother; Hyperlipidemia in her brother; Hypertension in her brother, father, and mother; Lung cancer in her father; Lung disease in her mother.  SOCIAL HISTORY:  reports that she  quit smoking about 31 years ago. She has never used smokeless tobacco. She reports that she drinks about 7.2 oz of alcohol per week . She reports that she does not use drugs.  ALLERGIES: Adhesive [tape]  MEDICATIONS:  Current Outpatient Prescriptions  Medication Sig Dispense Refill  . acetaminophen (TYLENOL) 500 MG tablet Take 1,000 mg by mouth every 6 (six) hours as needed.    Marland Kitchen albuterol (PROAIR HFA) 108 (90 Base) MCG/ACT inhaler Inhale 2 puffs into the lungs every 4 (four) hours as needed for wheezing or shortness of breath. 1 Inhaler 1  . ALPRAZolam (XANAX) 0.25 MG tablet Take 1 tablet (0.25 mg total) by mouth at bedtime as needed. for sleep 30 tablet 1  . beclomethasone (QVAR) 40 MCG/ACT inhaler INHALE 2 PUFFS BY MOUTH TWICE DAILY TO PREVENT COUGH OR WHEEZING. RINSE, GARGLE AND SPIT AFTER USE 8.7 g 4  . Calcium Carbonate-Vitamin D (CALCIUM + D PO) Take by mouth.    . cetirizine (ZYRTEC) 10 MG tablet Take 10 mg by mouth.    . docusate sodium (COLACE) 50 MG capsule Take 50 mg by mouth.    . fluticasone (FLONASE) 50 MCG/ACT nasal spray Place 2 sprays into both nostrils daily. 16 g 5  . methocarbamol (ROBAXIN) 500 MG tablet Take 1 tablet (500 mg total) by mouth every 8 (eight) hours as needed for muscle spasms. 30 tablet 0  . oxyCODONE (ROXICODONE) 5 MG immediate release tablet Take 1-2 tablets (5-10 mg total) by mouth every 4 (four) hours as needed for severe pain. 30 tablet 0  . PRILOSEC OTC 20 MG tablet Reported on 12/14/2015    . Probiotic Product (PROBIOTIC DAILY PO) Take by mouth.    . solifenacin (VESICARE)  5 MG tablet Take 1 tablet (5 mg total) by mouth daily. 30 tablet 12  . tamoxifen (NOLVADEX) 20 MG tablet Take 20 mg by mouth daily.    . Turmeric 450 MG CAPS Take by mouth.    . venlafaxine XR (EFFEXOR-XR) 75 MG 24 hr capsule Take 1 capsule (75 mg total) by mouth daily with breakfast. 90 capsule 12   No current facility-administered medications for this encounter.     REVIEW OF  SYSTEMS: A 10+ POINT REVIEW OF SYSTEMS WAS OBTAINED including neurology, dermatology, psychiatry, cardiac, respiratory, lymph, extremities, GI, GU, Musculoskeletal, constitutional, breasts, reproductive, HEENT.  All pertinent positives are noted in the HPI.  All others are negative.   PHYSICAL EXAM:  height is _0  (1.626 m) and weight is 147 lb 3.2 oz (66.8 kg). Her temperature is 98.5 F (36.9 C). Her blood pressure is 138/72 and her pulse is 73. Her oxygen saturation is 100%.   General: Alert and oriented, in no acute distress HEENT: Head is normocephalic. Extraocular movements are intact. Oropharynx is clear. Neck: Neck is supple, no palpable cervical or supraclavicular lymphadenopathy. Heart: Regular in rate and rhythm with no murmurs, rubs, or gallops. Chest: Clear to auscultation bilaterally, with no rhonchi, wheezes, or rales. Abdomen: Soft, nontender, nondistended, with no rigidity or guarding. Extremities: No cyanosis or edema. Lymphatics: see Neck Exam Skin: No concerning lesions. Musculoskeletal: symmetric strength and muscle tone throughout. Neurologic: Cranial nerves II through XII are grossly intact. No obvious focalities. Speech is fluent. Coordination is intact. Psychiatric: Judgment and insight are intact. Affect is appropriate. Breasts: Axillary and lumpectomy scar in right breast has healed well. Undergoing tissue expansion on the left side status post nipple sparing mastectomy. Scars are still healing, JP drain is still in place. No other palpable masses appreciated in the breasts or axillae.    ECOG = 0  0 - Asymptomatic (Fully active, able to carry on all predisease activities without restriction)  1 - Symptomatic but completely ambulatory (Restricted in physically strenuous activity but ambulatory and able to carry out work of a light or sedentary nature. For example, light housework, office work)  2 - Symptomatic, <50% in bed during the day (Ambulatory and capable  of all self care but unable to carry out any work activities. Up and about more than 50% of waking hours)  3 - Symptomatic, >50% in bed, but not bedbound (Capable of only limited self-care, confined to bed or chair 50% or more of waking hours)  4 - Bedbound (Completely disabled. Cannot carry on any self-care. Totally confined to bed or chair)  5 - Death   Eustace Pen MM, Creech RH, Tormey DC, et al. [phone]). "Toxicity and response criteria of the [facility]". [place] Oncol. 5 (6): 649-55   LABORATORY DATA:  Lab Results  Component Value Date   WBC 7.6 04/01/2016   HGB 14.4 07/01/2016   HCT 42.2 04/01/2016   MCV 91.3 04/01/2016   PLT 376 04/01/2016   CMP     Component Value Date/Time   NA 137 04/01/2016 1612   K 3.9 04/01/2016 1612   CO2 24 04/01/2016 1612   GLUCOSE 108 04/01/2016 1612   BUN 12.0 04/01/2016 1612   CREATININE 0.8 04/01/2016 1612   CALCIUM 9.3 04/01/2016 1612   PROT 6.9 04/01/2016 1612   ALBUMIN 3.5 04/01/2016 1612   AST 53 (H) 04/01/2016 1612   ALT 92 (H) 04/01/2016 1612   ALKPHOS 84 04/01/2016 1612   BILITOT  0.34 04/01/2016 1612         RADIOGRAPHY:  As above    IMPRESSION/PLAN: Bilateral breast cancers, both node +   It was a pleasure meeting the patient today. We discussed the risks, benefits, and side effects of radiotherapy. I recommend radiotherapy to the bilateral breasts and regional lymph nodes to reduce her risk of locoregional recurrence by 2/3.  We discussed that radiation would take approximately 7 weeks to complete and that I would give the patient a few weeks to heal following surgery before starting treatment planning. I believe that Dr. Jana Hakim may be administering concurrent Xeloda which I will take into consideration during the treatment process. We spoke about acute effects including skin irritation and fatigue as well as much less common late effects including internal organ injury or irritation. We discussed the  risk of capsular contracture and scar tissue in a reconstructed breast.  We spoke about the latest technology that is used to minimize the risk of late effects for patients undergoing radiotherapy to the breast or chest wall. No guarantees of treatment were given. The patient is enthusiastic about proceeding with treatment. I look forward to participating in the patient's care.  A consent form was signed and a copy was placed in the patient's chart.  I would like to start radiation by the first week of February at the latest, hopefully by end of January. I will tentatively schedule treatment planning for the middle of January. I will send a note to her oncologic team, including her surgeons.  If she needs this appointment for RT planning moved back due to healing or need for more reexpansion of the reconstructed breast, then may contact me.  Addendum: Patient discussed at tumor board. Pathology clarified that all margins are cleared. I also spoke with Dr. Donne Hazel personally about her case.  I will not boost the chest wall because there is not a specific area at highest risk of recurrence - margins are diffusely close, but negative, and boosting the skin diffusely to 60.4Gy would raise concern for skin/nipple necrosis. __________________________________________   Eppie Gibson, MD  This document serves as a record of services personally performed by Eppie Gibson, MD. It was created on her behalf by Bethann Humble, a trained medical scribe. The creation of this record is based on the scribe's personal observations and the provider's statements to them. This document has been checked and approved by the attending provider.

## 2016-07-16 DIAGNOSIS — Z9012 Acquired absence of left breast and nipple: Secondary | ICD-10-CM | POA: Insufficient documentation

## 2016-07-16 NOTE — Addendum Note (Signed)
Encounter addended by: Eppie Gibson, MD on: 07/16/2016  8:54 AM<BR>    Actions taken: Sign clinical note

## 2016-07-17 ENCOUNTER — Ambulatory Visit (HOSPITAL_BASED_OUTPATIENT_CLINIC_OR_DEPARTMENT_OTHER): Payer: BC Managed Care – PPO | Admitting: Oncology

## 2016-07-17 VITALS — BP 140/77 | HR 70 | Temp 97.9°F | Resp 18 | Ht 64.0 in | Wt 147.9 lb

## 2016-07-17 DIAGNOSIS — C50411 Malignant neoplasm of upper-outer quadrant of right female breast: Secondary | ICD-10-CM

## 2016-07-17 DIAGNOSIS — Z171 Estrogen receptor negative status [ER-]: Secondary | ICD-10-CM

## 2016-07-17 DIAGNOSIS — C50112 Malignant neoplasm of central portion of left female breast: Secondary | ICD-10-CM

## 2016-07-17 DIAGNOSIS — C50412 Malignant neoplasm of upper-outer quadrant of left female breast: Secondary | ICD-10-CM | POA: Diagnosis not present

## 2016-07-17 DIAGNOSIS — Z170 Estrogen receptor positive status [ER+]: Secondary | ICD-10-CM

## 2016-07-17 DIAGNOSIS — C773 Secondary and unspecified malignant neoplasm of axilla and upper limb lymph nodes: Secondary | ICD-10-CM

## 2016-07-17 MED ORDER — CAPECITABINE 500 MG PO TABS: 1000.0000 mg | ORAL_TABLET | Freq: Two times a day (BID) | ORAL | 0 refills | Status: DC

## 2016-07-17 NOTE — Progress Notes (Signed)
[name]  [phone] Fax:([phone]     ID: Sabrina Tapia DOB: 23-Nov-1950  MR#: [id number]  PTW#:[id number]  Patient Care Team: Josetta Huddle, MD as PCP - General (Internal Medicine) Rolm Bookbinder, MD as Consulting Physician (General Surgery) Chauncey Cruel, MD as Consulting Physician (Oncology) Anastasio Auerbach, MD as Consulting Physician (Gynecology) OTHER MD:  CHIEF COMPLAINT: Estrogen receptor positive breast cancer  CURRENT TREATMENT: Awaiting adjuvant radiation   BREAST CANCER HISTORY: From the earlier summary note:  "Sabrina Tapia" had screening mammography at her gynecologist's office suggestive of a change in the upper-outer quadrant of the right breast. She was referred to [facility] where on 03/04/2016 she underwent bilateral diagnostic mammography and right breast ultrasonography. The breast density was category C. In the upper outer quadrant of the right breast there was a new area of architectural distortion. By ultrasonography this measured 0.5 cm. The right axilla was reported as sonographically benign.  On 03/06/2016 she underwent core needle biopsy of the right breast mass in question, and this showed ([id number]) an invasive ductal carcinoma, grade 1, estrogen receptor 95% positive, progesterone receptor 95% positive, both with strong staining intensity, with an MIB-1 of 12%, and no HER-2 amplification, the signals ratio being 1.54 and the number per cell 2.15.  Her case was presented in the multidisciplinary breast cancer conference 03/12/2016.Marland Kitchen At that time a preliminary plan was proposed: Breast conserving surgery with sentinel lymph node sampling, no Oncotype if the tumor was no larger than 5 mm, and consideration of genetics testing.  On 03/26/2016 Sabrina Tapia underwent right lumpectomy and sentinel lymph node sampling. This showed ([id number]) an invasive ductal carcinoma measuring 1.4 cm, grade 1, with negative margins, and a  macrometastatic deposit of ductal carcinoma in the single sentinel lymph node. Mammaprint from this tumor was read as "low risk".  However there was a separate invasive lobular carcinoma measuring 0.6 cm,  focally involving the final right medial margin. Because of the lobular breast cancer bilateral breast MRI was obtained 04/04/2016. This showed in the right breast a 7.4 x 4.5 cm seroma with 2 enhancing masses posterior to the lumpectomy cavity, the larger measuring 1.0 cm. These were felt possibly to be reactive lymph nodes. One of these lymph nodes was biopsied 04/09/2016 and this showed ([id number]) benign lymph node tissue. The MRI showed no evidence of additional disease in the right breast.  However in the left breast centrally there was a 1.3 cm enhancing mass. Biopsy of this left breast mass 04/09/2016 ([id number]) showed an invasive ductal carcinoma, grade 1 or 2, with insufficient tissue for a prognostic panel. On 04/11/2016 biopsy of a second left breast mass, upper outer quadrant, 4.6 cm a way from the other left breast mass, showed ([id number]) an invasive ductal carcinoma, grade 2, estrogen receptor 10% positive with strong staining intensity, progesterone receptor negative, with an MIB-1 of 2%, and no HER-2 amplification, the signals ratio being 1.50 and the number per cell 2.10. The proliferation fraction was 2%.  On 04/14/2016 Sabrina Tapia underwent biopsy of a suspicious left axillary lymph node and this ([id number]) was positive for invasive ductal carcinoma, estrogen and progesterone receptor negative, with an MIB-1 of 10%. I do not find that HER-2 was repeated. Mammaprint from this tumor also came back low risk.  Her subsequent history is as detailed below.  INTERVAL HISTORY: Sabrina Tapia returns today for follow-up of her estrogen receptor positive breast cancer accompanied by a friend. Since her last visit here  Sabrina Tapia underwent a left nipple sparing mastectomy, on 07/01/2016. The final  pathology ([id number]) showed multiple foci of grade 2 invasive ductal carcinoma, and what turns out to be a multiple close margins but despite the ambiguous reading on the pathology report margins were actually negative (we have requested an addendum from pathology to make this crystal clear). This was discussed extensively at the multidisciplinary breast cancer conference 07/16/2016. I have also discussed this personally with Dr. Donne Hazel and Dr. Isidore Moos.  More specifically questions were raised whether the nipple should be removed or boosted. If boosted it would probably necrose. For that reason the thought was that perhaps it should be removed, but again after further discussion with surgery and radiation the plan will be to leave the nipple in place and not to do a boost, instead we will try to help the radiation work a little bit better by adding capecitabine sensitization.  She continues on tamoxifen, with good tolerance. Hot flashes are not a major concern. She doesn't have any vaginal discharge or similar problems. She obtains a drug at a good price.   REVIEW OF SYSTEMS: Sabrina Tapia did well with her mastectomy, and has not had significant problems with fever, bleeding, erythema, swelling, or pain. She tells me she is sleeping poorly and taking Xanax maybe twice a week to help with that. She complains of hearing loss, and joint pains here and there which are not more intense or persistent than before. A detailed review of systems today was otherwise stable  PAST MEDICAL HISTORY: Past Medical History:  Diagnosis Date  . Arthritis   . Asthma    allergy induced  . Cancer ([place]) 2017   bil breast cancer  . Cervical polyp   . Detrusor instability   . GERD (gastroesophageal reflux disease)   . Heart burn   . Hemorrhoids   . Hypertonicity of bladder   . Joint pain   . LGSIL (low grade squamous intraepithelial dysplasia) 2001   colpo biopsy negative, negative paps since  . Osteopenia 02/2016    T score -1.3 FRAX 8.3%/0.7% stable from prior DEXA    PAST SURGICAL HISTORY: Past Surgical History:  Procedure Laterality Date  . BREAST RECONSTRUCTION WITH PLACEMENT OF TISSUE EXPANDER AND FLEX HD (ACELLULAR HYDRATED DERMIS) Left 07/01/2016   Procedure: BREAST RECONSTRUCTION WITH PLACEMENT OF TISSUE EXPANDER AND  ALLODERM;  Surgeon: Irene Limbo, MD;  Location: [place];  Service: Plastics;  Laterality: Left;  . INCISION AND DRAINAGE Left 06/02/2016   Procedure: DRAINAGE of left axilla seroma;  Surgeon: Rolm Bookbinder, MD;  Location: [place];  Service: General;  Laterality: Left;  . NIPPLE SPARING MASTECTOMY Left 07/01/2016   Procedure: LEFT NIPPLE SPARING MASTECTOMY;  Surgeon: Rolm Bookbinder, MD;  Location: [place];  Service: General;  Laterality: Left;  . RADIOACTIVE SEED GUIDED MASTECTOMY WITH AXILLARY SENTINEL LYMPH NODE BIOPSY Right 03/26/2016   Procedure: RIGHT BREAST LUMPECTOMY WITH RADIOACTIVE SEED AND SENTINEL LYMPH NODE BIOPSY;  Surgeon: Rolm Bookbinder, MD;  Location: [place];  Service: General;  Laterality: Right;  . RADIOACTIVE SEED GUIDED MASTECTOMY WITH AXILLARY SENTINEL LYMPH NODE BIOPSY Left 05/20/2016   Procedure: LEFT BREAST RADIOACTIVE SEED (two seeds) GUIDED LUMPECTOMY WITH LEFT AXILLARY SENTINEL LYMPH NODE BIOPSY AND LEFT SEED TARGETED AXILLARY LYMPH NODE EXCISION;  Surgeon: Rolm Bookbinder, MD;  Location: [place];  Service: General;  Laterality: Left;  . RE-EXCISION OF BREAST CANCER,SUPERIOR MARGINS Right 05/20/2016   Procedure: RE-EXCISION OF  RIGHT BREAST MARGIN;  Surgeon: Rolm Bookbinder, MD;  Location: [place];  Service: General;  Laterality: Right;  . RE-EXCISION OF BREAST CANCER,SUPERIOR MARGINS Left 06/02/2016   Procedure: RE-EXCISION OF BREAST CANCER,SUPERIOR MARGINS;  Surgeon: Rolm Bookbinder, MD;  Location: [place];  Service:  General;  Laterality: Left;    FAMILY HISTORY Family History  Problem Relation Age of Onset  . Hypertension Mother   . Heart disease Mother   . Lung disease Mother   . Hypertension Father   . Cancer Father     colon  . Heart disease Father   . Lung cancer Father   . Breast cancer Paternal Grandmother     Age 85's  . Hypertension Brother   . Hyperlipidemia Brother   . Diabetes Maternal Grandfather   The patient's father died from lung cancer at the age of 13. He also had a remote history of colon cancer. The patient's mother died at age 11. The patient has one brother, no sisters.  GYNECOLOGIC HISTORY:  No LMP recorded. Patient is postmenopausal. Menarche age 48, the patient is GX P0. 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 She lives alone, with no pets.    ADVANCED DIRECTIVES: In place. She has named her brother Sabrina Tapia as her healthcare part of attorney. He can be reached at [phone]   HEALTH MAINTENANCE: Social History  Substance Use Topics  . Smoking status: Former Smoker    Quit date: 12/20/1984  . Smokeless tobacco: Never Used  . Alcohol use 7.2 oz/week    12 Cans of beer per week     Comment: daily beer     Colonoscopy:February 2016  PAP:  Bone density: 03/04/2016/osteopenia   Allergies  Allergen Reactions  . Adhesive [Tape] Rash    Current Outpatient Prescriptions  Medication Sig Dispense Refill  . acetaminophen (TYLENOL) 500 MG tablet Take 1,000 mg by mouth every 6 (six) hours as needed.    Marland Kitchen albuterol (PROAIR HFA) 108 (90 Base) MCG/ACT inhaler Inhale 2 puffs into the lungs every 4 (four) hours as needed for wheezing or shortness of breath. 1 Inhaler 1  . ALPRAZolam (XANAX) 0.25 MG tablet Take 1 tablet (0.25 mg total) by mouth at bedtime as needed. for sleep 30 tablet 1   . beclomethasone (QVAR) 40 MCG/ACT inhaler INHALE 2 PUFFS BY MOUTH TWICE DAILY TO PREVENT COUGH OR WHEEZING. RINSE, GARGLE AND SPIT AFTER USE 8.7 g 4  . Calcium Carbonate-Vitamin D (CALCIUM + D PO) Take by mouth.    . capecitabine (XELODA) 500 MG tablet Take 2 tablets (1,000 mg total) by mouth 2 (two) times daily after a meal. 120 tablet 0  . cetirizine (ZYRTEC) 10 MG tablet Take 10 mg by mouth.    . docusate sodium (COLACE) 50 MG capsule Take 50 mg by mouth.    . fluticasone (FLONASE) 50 MCG/ACT nasal spray Place 2 sprays into both nostrils daily. 16 g 5  . methocarbamol (ROBAXIN) 500 MG tablet Take 1 tablet (500 mg total) by mouth every 8 (eight) hours as needed for muscle spasms. 30 tablet 0  . oxyCODONE (ROXICODONE) 5 MG immediate release tablet Take 1-2 tablets (5-10 mg total) by mouth every 4 (four) hours as needed for severe pain. 30 tablet 0  . PRILOSEC OTC 20 MG tablet Reported  on 12/14/2015    . Probiotic Product (PROBIOTIC DAILY PO) Take by mouth.    . solifenacin (VESICARE) 5 MG tablet Take 1 tablet (5 mg total) by mouth daily. 30 tablet 12  . tamoxifen (NOLVADEX) 20 MG tablet Take 20 mg by mouth daily.    . Turmeric 450 MG CAPS Take by mouth.    . venlafaxine XR (EFFEXOR-XR) 75 MG 24 hr capsule Take 1 capsule (75 mg total) by mouth daily with breakfast. 90 capsule 12   No current facility-administered medications for this visit.     OBJECTIVE: Middle-aged white woman Who appears well Vitals:   07/17/16 1052  BP: 140/77  Pulse: 70  Resp: 18  Temp: 97.9 F (36.6 C)     Body mass index is 25.39 kg/m.    ECOG FS:1 - Symptomatic but completely ambulatory  Sclerae unicteric, pupils round and equal Oropharynx clear and moist-- no thrush or other lesions No cervical or supraclavicular adenopathy Lungs no rales or rhonchi Heart regular rate and rhythm Abd soft, nontender, positive bowel sounds MSK no focal spinal tenderness, no upper extremity lymphedema Neuro: nonfocal, well  oriented, appropriate affect Breasts: The right breast is status post lumpectomy. There is no evidence of local recurrence. The right axilla is benign. The left breast is status post recent mastectomy. The cosmetic result is excellent. There is no erythema, dehiscence, or evidence of local recurrence or residual disease. The left axilla is benign.   AB RESULTS:  CMP     Component Value Date/Time   NA 137 04/01/2016 1612   K 3.9 04/01/2016 1612   CO2 24 04/01/2016 1612   GLUCOSE 108 04/01/2016 1612   BUN 12.0 04/01/2016 1612   CREATININE 0.8 04/01/2016 1612   CALCIUM 9.3 04/01/2016 1612   PROT 6.9 04/01/2016 1612   ALBUMIN 3.5 04/01/2016 1612   AST 53 (H) 04/01/2016 1612   ALT 92 (H) 04/01/2016 1612   ALKPHOS 84 04/01/2016 1612   BILITOT 0.34 04/01/2016 1612    INo results found for: SPEP, UPEP  Lab Results  Component Value Date   WBC 7.6 04/01/2016   NEUTROABS 4.6 04/01/2016   HGB 14.4 07/01/2016   HCT 42.2 04/01/2016   MCV 91.3 04/01/2016   PLT 376 04/01/2016      Chemistry      Component Value Date/Time   NA 137 04/01/2016 1612   K 3.9 04/01/2016 1612   CO2 24 04/01/2016 1612   BUN 12.0 04/01/2016 1612   CREATININE 0.8 04/01/2016 1612      Component Value Date/Time   CALCIUM 9.3 04/01/2016 1612   ALKPHOS 84 04/01/2016 1612   AST 53 (H) 04/01/2016 1612   ALT 92 (H) 04/01/2016 1612   BILITOT 0.34 04/01/2016 1612       No results found for: LABCA2  No components found for: LABCA125  No results for input(s): INR in the last 168 hours.  Urinalysis    Component Value Date/Time   COLORURINE YELLOW 01/31/2016 1526   APPEARANCEUR CLEAR 01/31/2016 1526   LABSPEC 1.006 01/31/2016 1526   PHURINE 6.5 01/31/2016 1526   GLUCOSEU NEGATIVE 01/31/2016 1526   HGBUR NEGATIVE 01/31/2016 1526   BILIRUBINUR NEGATIVE 01/31/2016 1526   KETONESUR NEGATIVE 01/31/2016 1526   PROTEINUR NEGATIVE 01/31/2016 1526   UROBILINOGEN 0.2 12/11/2014 1506   NITRITE NEGATIVE  01/31/2016 1526   LEUKOCYTESUR TRACE (A) 01/31/2016 1526     STUDIES: No results found.  ELIGIBLE FOR AVAILABLE RESEARCH PROTOCOL: no  ASSESSMENT: 65  y.o. [place] woman status post right breast upper outer quadrant lumpectomy 03/26/2016 for a pT1c pN1, stage IIA invasive ductal carcinoma, grade 1, estrogen and progesterone receptor positive, HER-2 nonamplified, with an MIB-1 of 12%   (a) mammaprint from this tumor was read as "low risk".    (1) a second right breast cancer, invasive lobular, grade 1, measuring 0.6 cm, focally involved the final right medial margin from the 03/26/2016 procedure; this tumor also was estrogen and progesterone receptor positive, HER-2 negative, with an MIB-1 of 3%  (a) medial margin cleared with additional surgery 05/20/2016.  (2) two biopsies from the left breast 04/09/2016 and 04/11/2016, 4.6 cm apart, showed  (a) centrally, an invasive ductal carcinoma, grade 1 or 2, with no prognostic panel available  (b) in the upper outer quadrant, invasive ductal carcinoma, grade 2, estrogen receptor 10% positive, progesterone receptor and HER-2 negative, with an MIB-1 of 2%  (c) left axillary lymph node biopsy 04/14/2016 showed invasive ductal carcinoma, estrogen and progesterone receptor negative  (d) mammaprint from (2)(c) was also "low risk"  (3) left lumpectomy 05/20/2016 showed a  pT1c pN1 stage IIA  invasive ductal carcinoma, grade 2, estrogen receptor positive, progesterone receptor negative, HER-2 nonamplified, with an MIB-1 of 2%; margins were positive   (4) status post left sided nipple sparing mastectomy with immediate expander placement 07/01/2016 showing multiple foci of residual grade 2 invasive ductal carcinoma, the largest measuring 0.7 cm, with evidence of lymphovascular invasion and multiple close but negative margins  (5) radiation therapy to follow   (6) tamoxifen started 04/18/2016--to be continued and minimum of 5 years  (7) genetics  consultation pending   PLAN; I met with Sabrina Tapia approximately 30 minutes today to review her complex situation. She was initially confused (as was pretty much everyone else) by the pathology report, but has been reassured that the margins are indeed negative. We also discussed the nipple issue and the conclusion is that we are not going to remove the nipple and were also not going to boosted.  Instead what we are going to do is set capecitabine sensitization to her radiation treatments. We discussed this at length today and she understands the possible toxicities, side effects and complications of this agent. In this setting and at lower doses as we plan to use, capecitabine is generally very well tolerated. I am not planning to give her a total of 6 months of full dose treatment. She will only receive capecitabine on the day of radiation and only at 1 g twice a day.  She will meet with Dr. Isidore Moos in January and she will see me again later that month. We will review the capecitabine issue again at that time but I went ahead and placed the order today so she will have this medication on hand.  She knows to call for any problems that may develop before her next visit  :Chauncey Cruel, MD   07/17/2016 1:42 PM Medical Oncology and Hematology [facility] [place], [place] [address] Tel. [phone]    Fax. [phone]

## 2016-07-18 ENCOUNTER — Other Ambulatory Visit: Payer: Self-pay | Admitting: *Deleted

## 2016-07-18 ENCOUNTER — Telehealth: Payer: Self-pay | Admitting: Pharmacist

## 2016-07-18 MED ORDER — CAPECITABINE 500 MG PO TABS: 1000.0000 mg | ORAL_TABLET | Freq: Two times a day (BID) | ORAL | 0 refills | Status: DC

## 2016-07-18 NOTE — Telephone Encounter (Signed)
Oral Chemotherapy Pharmacist Encounter  Received notification from [facility] that patient's Xeloda prescription would require prior authorization. We did not receive the original Rx prior to it being sent to the pharmacy.  Last labs on Epic from 04/01/16, noted LFTs 2x ULN, OK for treatment  Current medication list in Epic assessed, some DDIs with Xeloda identified:  Xeloda and omeprazole, Category C theoretic interaction due to acid suppression and decreased absorption of the Xeloda, no change to therapy indicated at this time  Xeloda and solifenacin and venlafaxin: Category B interaction with possible risk of QTc prolongation, no EKG on file. No change to therapy indicated at this time.  Noted patient has [place] provided health plan managed through [facility]. Due to this I will ask Dr. Virgie Dad collaborative RN to e-scribe Xeloda prescription to [facility] ([facility]) in [place], [place] for further benefits analysis and medication dispensing.  Oral Oncology Clinic will continue to follow.  Johny Drilling, PharmD, BCPS, BCOP 07/18/2016  10:12 AM Oral Oncology Clinic [phone]

## 2016-07-18 NOTE — Telephone Encounter (Signed)
Prescription for Xeloda e-scribed to [facility] in [place] per oral chemo pharmacist request.

## 2016-07-25 ENCOUNTER — Ambulatory Visit: Payer: BC Managed Care – PPO | Admitting: Physical Therapy

## 2016-07-25 DIAGNOSIS — M25612 Stiffness of left shoulder, not elsewhere classified: Secondary | ICD-10-CM

## 2016-07-25 DIAGNOSIS — Z483 Aftercare following surgery for neoplasm: Secondary | ICD-10-CM

## 2016-07-25 DIAGNOSIS — M25512 Pain in left shoulder: Secondary | ICD-10-CM

## 2016-07-25 NOTE — Telephone Encounter (Signed)
Oral Chemotherapy Pharmacist Encounter  I called [facility] at [phone] to follow up on status of Xeloda Rx sent on 07/18/16 (prescription sent there due to patient's insurance - [facility]).  I was informed PA is required.  No one from their pharmacy has reached out to anyone at [facility] with this information. I expressed dissatisfaction with this process. I called [phone] to initiate prior authorization.  PA Case# [id number] Prior authorization was DENIED as [employer] only approves PAs for Xeloda for breast cancer if it is recurrent or metastatic. Since patient has stage IIA breast cancer, they can not approve PA without an appeal.  We will receive denial letter at [facility] today or tomorrow (12/29 or 07/26/16). Appeal packet will be prepared once we receive the denial letter. [employer]BS appeal dept fax: [phone], phone: [phone]  Oral Oncology Clinic will continue to follow.  Johny Drilling, PharmD, BCPS, BCOP 07/25/2016  1:03 PM Oral Oncology Clinic [phone]

## 2016-07-25 NOTE — Therapy (Signed)
[place], [place], [address] Phone: [phone]   Fax:  [phone]  Physical Therapy Evaluation  Patient Details  Name: Sabrina Tapia MRN: [id number] Date of Birth: April 26, 1951 Referring Provider: Dr. Irene Limbo  Encounter Date: 07/25/2016      PT End of Session - 07/25/16 1158    Visit Number 1   Number of Visits 9   Date for PT Re-Evaluation 08/27/16   PT Start Time 1110   PT Stop Time 1150   PT Time Calculation (min) 40 min   Activity Tolerance Patient limited by lethargy;Patient limited by pain   Behavior During Therapy [facility] for tasks assessed/performed      Past Medical History:  Diagnosis Date  . Arthritis   . Asthma    allergy induced  . Cancer ([place]) 2017   bil breast cancer  . Cervical polyp   . Detrusor instability   . GERD (gastroesophageal reflux disease)   . Heart burn   . Hemorrhoids   . Hypertonicity of bladder   . Joint pain   . LGSIL (low grade squamous intraepithelial dysplasia) 2001   colpo biopsy negative, negative paps since  . Osteopenia 02/2016   T score -1.3 FRAX 8.3%/0.7% stable from prior DEXA    Past Surgical History:  Procedure Laterality Date  . BREAST RECONSTRUCTION WITH PLACEMENT OF TISSUE EXPANDER AND FLEX HD (ACELLULAR HYDRATED DERMIS) Left 07/01/2016   Procedure: BREAST RECONSTRUCTION WITH PLACEMENT OF TISSUE EXPANDER AND  ALLODERM;  Surgeon: Irene Limbo, MD;  Location: [place];  Service: Plastics;  Laterality: Left;  . INCISION AND DRAINAGE Left 06/02/2016   Procedure: DRAINAGE of left axilla seroma;  Surgeon: Rolm Bookbinder, MD;  Location: [place];  Service: General;  Laterality: Left;  . NIPPLE SPARING MASTECTOMY Left 07/01/2016   Procedure: LEFT NIPPLE SPARING MASTECTOMY;  Surgeon: Rolm Bookbinder, MD;  Location: [place];  Service: General;  Laterality: Left;  . RADIOACTIVE SEED  GUIDED MASTECTOMY WITH AXILLARY SENTINEL LYMPH NODE BIOPSY Right 03/26/2016   Procedure: RIGHT BREAST LUMPECTOMY WITH RADIOACTIVE SEED AND SENTINEL LYMPH NODE BIOPSY;  Surgeon: Rolm Bookbinder, MD;  Location: [place];  Service: General;  Laterality: Right;  . RADIOACTIVE SEED GUIDED MASTECTOMY WITH AXILLARY SENTINEL LYMPH NODE BIOPSY Left 05/20/2016   Procedure: LEFT BREAST RADIOACTIVE SEED (two seeds) GUIDED LUMPECTOMY WITH LEFT AXILLARY SENTINEL LYMPH NODE BIOPSY AND LEFT SEED TARGETED AXILLARY LYMPH NODE EXCISION;  Surgeon: Rolm Bookbinder, MD;  Location: [place];  Service: General;  Laterality: Left;  . RE-EXCISION OF BREAST CANCER,SUPERIOR MARGINS Right 05/20/2016   Procedure: RE-EXCISION OF RIGHT BREAST MARGIN;  Surgeon: Rolm Bookbinder, MD;  Location: [place];  Service: General;  Laterality: Right;  . RE-EXCISION OF BREAST CANCER,SUPERIOR MARGINS Left 06/02/2016   Procedure: RE-EXCISION OF BREAST CANCER,SUPERIOR MARGINS;  Surgeon: Rolm Bookbinder, MD;  Location: [facility];  Service: General;  Laterality: Left;    There were no vitals filed for this visit.       Subjective Assessment - 07/25/16 1112    Subjective "I felt like the shoulder was getting a little stiff, so Dr. Iran Planas wrote for physical therapy." Notes stiffness with reaching left arm across toward right shoulder.   Pertinent History Diagnosed 03/04/16; first surgery end of August for right breast lumpectomy but needed re-excision; left breast had two tumors and one irregular lymph node.  Had left breast lumpectomies 05/20/16 but margins  weren't clear; had re-excision to get clear margins and "scattered cancer cells" were found (06/02/16), so had left mastectomy with immediate expander placement 07/01/16. One lymph node removed on right and it had microscopic cancer cells; 4 removed on left, 2 positive, so she expects to have radiation to both areas.  Has  XRT simulation on 08/11/16.  Will take Xeloda to enhance radiation.  Has GI issues, overactive bladder.   Patient Stated Goals be able to use the arm normally and get rid of pins and needles feeling   Currently in Pain? Yes   Pain Score 2    Pain Location Breast   Pain Orientation Left;Upper;Lateral   Pain Descriptors / Indicators Tingling   Aggravating Factors  moving left arm   Pain Relieving Factors at rest            [facility] PT Assessment - 07/25/16 0001      Assessment   Medical Diagnosis bilateral breast cancer with recent left mastectomy and expander placed   Referring Provider Dr. Irene Limbo   Onset Date/Surgical Date 07/01/16   Hand Dominance Right   Prior Therapy here, up until recent surgery     Precautions   Precautions Other (comment)   Precaution Comments cancer precautions; okay to increase activities as tolerated per Dr. Para Skeans note     Restrictions   Weight Bearing Restrictions No     Balance Screen   Has the patient fallen in the past 6 months No   Has the patient had a decrease in activity level because of a fear of falling?  No   Is the patient reluctant to leave their home because of a fear of falling?  No     Home Environment   Living Environment Private residence   Living Arrangements Alone   Type of Home Other(Comment)  townhome   Home Layout Two level  doesn't need to go upstairs     Prior Function   Level of Independence Independent   Vocation Retired   Leisure plays golf 3-4x/week; does Pilates, yoga, and cardio each once a week normally     Cognition   Overall Cognitive Status Within Functional Limits for tasks assessed     Observation/Other Assessments   Skin Integrity Healing well at all incision areas   Quick DASH  68.18     AROM   Right Shoulder Flexion 152 Degrees   Right Shoulder ABduction 185 Degrees   Right Shoulder Horizontal ABduction 33 Degrees   Left Shoulder Flexion 133 Degrees   Left Shoulder ABduction 98  Degrees   Left Shoulder Internal Rotation --  WFL in supine   Left Shoulder External Rotation 81 Degrees  in supine   Left Shoulder Horizontal ABduction 11 Degrees     PROM   Left Shoulder Flexion 137 Degrees   Left Shoulder ABduction 102 Degrees     Palpation   Patella mobility very limited soft tissue mobility at inferior breast incision area and drain sites     Ambulation/Gait   Ambulation/Gait Yes              Quick Dash - 07/25/16 0001    Open a tight or new jar Moderate difficulty   Do heavy household chores (wash walls, wash floors) Unable   Carry a shopping bag or briefcase Moderate difficulty   Wash your back Unable   Use a knife to cut food Mild difficulty   Recreational activities in which you take some force or impact through your  arm, shoulder, or hand (golf, hammering, tennis) Unable   During the past week, to what extent has your arm, shoulder or hand problem interfered with your normal social activities with family, friends, neighbors, or groups? Extremely   During the past week, to what extent has your arm, shoulder or hand problem limited your work or other regular daily activities Extremely   Arm, shoulder, or hand pain. Severe   Tingling (pins and needles) in your arm, shoulder, or hand None   Difficulty Sleeping Moderate difficulty   DASH Score 68.18 %             [facility] Adult PT Treatment/Exercise - 07/25/16 0001      Shoulder Exercises: Supine   Horizontal ABduction Both  1 rep x 60 seconds, supine over towel roll     Manual Therapy   Passive ROM in supine to left shoulder for ER, abduction, and flexion to patient tolerance                PT Education - 07/25/16 1137    Education provided Yes   Education Details advised patient to start with breast MDC stretches and lying supine over a towel roll with arms outstretched for now   Person(s) Educated Patient   Methods Explanation   Comprehension Verbalized understanding                 [facility] Goals - 07/25/16 1202      CC Long Term Goal  #1   Title left shoulder active flexion 145 degrees for improved overhead reach   Baseline 133 degrees at re-eval on 07/25/16   Time 4   Period Weeks   Status New     CC Long Term Goal  #2   Title left shoulder active abduction 140 degrees for improved ADLs and to be able to get into position for XRT   Baseline 98 degrees at re-eval on 07/25/16   Time 4   Period Weeks   Status New     CC Long Term Goal  #3   Title pain decreased at least 50% when reaching with left arm   Time 4   Period Weeks   Status New     CC Long Term Goal  #4   Title quick DASH score reduced to 20 or less   Baseline 68.18 at re-eval on 07/25/16   Time 4   Period Weeks   Status New            Plan - 07/25/16 1159    Clinical Impression Statement Patient returns now some 24 days after left mastectomy with immediate expander placement; has had one fill and will have one more. Her left shoulder AROM has decreased compared to measurements taken before surgery and after the lumpectomy she had had previously.  Her left shoulder motion is limited by pain.  She expects to have simulation for radiation 08/11/16; currently she is not elevating her arm enough to get into position.   Rehab Potential Excellent   Clinical Impairments Affecting Rehab Potential recent mastectomy with expander placement and with one additional fill scheduled 07/31/16; also to start radiation mid-January   PT Frequency 2x / week   PT Duration 4 weeks   PT Treatment/Interventions ADLs/Self Care Home Management;Therapeutic exercise;Patient/family education;Manual techniques;Scar mobilization;Passive range of motion   PT Next Visit Plan Continue P/AA/A/ROM and work with patient to progress her HEP for left shoulder ROM.   PT Home Exercise Plan BMDC exercises and  lying supine over towel roll   Consulted and Agree with Plan of Care Patient      Patient  will benefit from skilled therapeutic intervention in order to improve the following deficits and impairments:  Decreased knowledge of precautions, Decreased knowledge of use of DME, Decreased range of motion, Decreased strength, Impaired UE functional use, Pain  Visit Diagnosis: Stiffness of left shoulder, not elsewhere classified - Plan: PT plan of care cert/re-cert  Acute pain of left shoulder - Plan: PT plan of care cert/re-cert  Aftercare following surgery for neoplasm - Plan: PT plan of care cert/re-cert      G-Codes - [id number] 1204    Functional Limitation Carrying, moving and handling objects   Carrying, Moving and Handling Objects Current Status [id number]) At least 72 percent but less than 80 percent impaired, limited or restricted   Carrying, Moving and Handling Objects Goal Status [id number]) At least 1 percent but less than 20 percent impaired, limited or restricted       Problem List Patient Active Problem List   Diagnosis Date Noted  . Breast cancer, left ([place]) 07/01/2016  . Breast cancer of upper-outer quadrant of left female breast ([place]) 04/19/2016  . Breast cancer of upper-outer quadrant of right female breast ([place]) 03/31/2016  . Acid reflux 11/20/2015  . Bunion of great toe of right foot 04/04/2015  . Hemorrhoid 11/23/2014  . Anal skin tag 11/23/2014  . Anal burning 11/23/2014  . Plantar fasciitis, right 05/11/2014  . Retrocalcaneal bursitis 05/11/2014  . Pain in joint, ankle and foot 05/11/2014  . Heart burn   . Joint pain   . Abnormal Pap smear   . Osteopenia     [place] 07/25/2016, 12:09 PM  [place], [place], [address] Phone: [phone]   Fax:  [phone]  Name: RAEVYNN MULDOWNEY MRN: [id number] Date of Birth: 10/01/1950  Serafina Royals, PT 07/25/16 12:10 PM

## 2016-07-27 NOTE — Telephone Encounter (Signed)
Sabrina Tapia, please refer to [facility] 2017 Jun 1;   [id number]): [id number]  In your appeal-- this is the established basis for ADJUVANT capecitabine after NEOadjuvant chemo  Thanks!

## 2016-07-29 ENCOUNTER — Ambulatory Visit: Payer: BC Managed Care – PPO | Attending: Plastic Surgery | Admitting: Physical Therapy

## 2016-07-29 DIAGNOSIS — Z483 Aftercare following surgery for neoplasm: Secondary | ICD-10-CM | POA: Insufficient documentation

## 2016-07-29 DIAGNOSIS — M25512 Pain in left shoulder: Secondary | ICD-10-CM | POA: Diagnosis present

## 2016-07-29 DIAGNOSIS — M25612 Stiffness of left shoulder, not elsewhere classified: Secondary | ICD-10-CM | POA: Insufficient documentation

## 2016-07-29 NOTE — Therapy (Signed)
[place], [place], [address] Phone: [phone]   Fax:  [phone]  Physical Therapy Treatment  Patient Details  Name: Sabrina Tapia MRN: [id number] Date of Birth: 10/31/1950 Referring Provider: Dr. Irene Limbo  Encounter Date: 07/29/2016      PT End of Session - 07/29/16 1659    Visit Number 2   Number of Visits 9   Date for PT Re-Evaluation 08/27/16   PT Start Time [id number]   PT Stop Time 1655   PT Time Calculation (min) 45 min   Activity Tolerance Patient tolerated treatment well   Behavior During Therapy [facility] for tasks assessed/performed      Past Medical History:  Diagnosis Date  . Arthritis   . Asthma    allergy induced  . Cancer ([place]) 2017   bil breast cancer  . Cervical polyp   . Detrusor instability   . GERD (gastroesophageal reflux disease)   . Heart burn   . Hemorrhoids   . Hypertonicity of bladder   . Joint pain   . LGSIL (low grade squamous intraepithelial dysplasia) 2001   colpo biopsy negative, negative paps since  . Osteopenia 02/2016   T score -1.3 FRAX 8.3%/0.7% stable from prior DEXA    Past Surgical History:  Procedure Laterality Date  . BREAST RECONSTRUCTION WITH PLACEMENT OF TISSUE EXPANDER AND FLEX HD (ACELLULAR HYDRATED DERMIS) Left 07/01/2016   Procedure: BREAST RECONSTRUCTION WITH PLACEMENT OF TISSUE EXPANDER AND  ALLODERM;  Surgeon: Irene Limbo, MD;  Location: [place];  Service: Plastics;  Laterality: Left;  . INCISION AND DRAINAGE Left 06/02/2016   Procedure: DRAINAGE of left axilla seroma;  Surgeon: Rolm Bookbinder, MD;  Location: [place];  Service: General;  Laterality: Left;  . NIPPLE SPARING MASTECTOMY Left 07/01/2016   Procedure: LEFT NIPPLE SPARING MASTECTOMY;  Surgeon: Rolm Bookbinder, MD;  Location: [place];  Service: General;  Laterality: Left;  . RADIOACTIVE SEED GUIDED MASTECTOMY WITH  AXILLARY SENTINEL LYMPH NODE BIOPSY Right 03/26/2016   Procedure: RIGHT BREAST LUMPECTOMY WITH RADIOACTIVE SEED AND SENTINEL LYMPH NODE BIOPSY;  Surgeon: Rolm Bookbinder, MD;  Location: [place];  Service: General;  Laterality: Right;  . RADIOACTIVE SEED GUIDED MASTECTOMY WITH AXILLARY SENTINEL LYMPH NODE BIOPSY Left 05/20/2016   Procedure: LEFT BREAST RADIOACTIVE SEED (two seeds) GUIDED LUMPECTOMY WITH LEFT AXILLARY SENTINEL LYMPH NODE BIOPSY AND LEFT SEED TARGETED AXILLARY LYMPH NODE EXCISION;  Surgeon: Rolm Bookbinder, MD;  Location: [place];  Service: General;  Laterality: Left;  . RE-EXCISION OF BREAST CANCER,SUPERIOR MARGINS Right 05/20/2016   Procedure: RE-EXCISION OF RIGHT BREAST MARGIN;  Surgeon: Rolm Bookbinder, MD;  Location: [place];  Service: General;  Laterality: Right;  . RE-EXCISION OF BREAST CANCER,SUPERIOR MARGINS Left 06/02/2016   Procedure: RE-EXCISION OF BREAST CANCER,SUPERIOR MARGINS;  Surgeon: Rolm Bookbinder, MD;  Location: [place];  Service: General;  Laterality: Left;    There were no vitals filed for this visit.      Subjective Assessment - 07/29/16 1614    Subjective "It was sore!"  Didn't do anything the day after she was here, but then started up exercises and has been doing okay.   Currently in Pain? Yes   Pain Score 4    Pain Location Breast   Pain Orientation Left   Pain Descriptors / Indicators Other (Comment)  "I'm feeling the expander."   Pain Relieving Factors staying still; rubbing gently  when she feels the pins and needles; tylenol occasionally                         [facility] Adult PT Treatment/Exercise - 07/29/16 0001      Shoulder Exercises: Supine   Horizontal ABduction Both  over pink foam roller, 30 seconds x 2 with break in between   Other Supine Exercises over pink foam roller, isometric shoulder extension and scapular retraction, 5 counts x 5    Other Supine Exercises hands up over head with elbows bent to simulate radiation position, and hold that for 2-3 minutes     Shoulder Exercises: Sidelying   Other Sidelying Exercises on right side, left UE D2 actively x 10     Manual Therapy   Myofascial Release left UE myofascial pulling in supine,moving into left shoulder abduction, then over into right sidelying;    Scapular Mobilization In right sidelying for left scapula for protraction and depression.   Passive ROM in supine to left shoulder for ER, abduction, and flexion to patient tolerance, also for horizontal abduction; in right sidelying, left shoulder abduction; over pink foam roller, pect minor stretches                 PT Education - 07/29/16 1658    Education provided Yes   Education Details okay to add lying supine with hands overhead and elbows bent to simulate XRT position   Person(s) Educated Patient   Methods Explanation   Comprehension Verbalized understanding;Returned demonstration                [facility] Goals - 07/25/16 1202      CC Long Term Goal  #1   Title left shoulder active flexion 145 degrees for improved overhead reach   Baseline 133 degrees at re-eval on 07/25/16   Time 4   Period Weeks   Status New     CC Long Term Goal  #2   Title left shoulder active abduction 140 degrees for improved ADLs and to be able to get into position for XRT   Baseline 98 degrees at re-eval on 07/25/16   Time 4   Period Weeks   Status New     CC Long Term Goal  #3   Title pain decreased at least 50% when reaching with left arm   Time 4   Period Weeks   Status New     CC Long Term Goal  #4   Title quick DASH score reduced to 20 or less   Baseline 68.18 at re-eval on 07/25/16   Time 4   Period Weeks   Status New            Plan - 07/29/16 1659    Clinical Impression Statement Patient had significant soreness after last session for 24 hours or so.  This session, she felt much  looser and tolerated stretching well.  She was able to get into a simulated XRT position.  She will have expander fill later this week, so will not return to therapy until next Monday (08/04/16).   Rehab Potential Excellent   Clinical Impairments Affecting Rehab Potential recent mastectomy with expander placement and with one additional fill scheduled 07/31/16; also to start radiation mid-January   PT Frequency 2x / week   PT Duration 4 weeks   PT Treatment/Interventions ADLs/Self Care Home Management;Therapeutic exercise;Patient/family education;Manual techniques;Scar mobilization;Passive range of motion   PT Next Visit Plan Continue P/AA/A/ROM  and work with patient to progress her HEP for left shoulder ROM.   PT Home Exercise Plan BMDC exercises and lying supine over towel roll; lying supine with hands overhead and elbows bent to simulate radiation positioning   Consulted and Agree with Plan of Care Patient      Patient will benefit from skilled therapeutic intervention in order to improve the following deficits and impairments:  Decreased knowledge of precautions, Decreased knowledge of use of DME, Decreased range of motion, Decreased strength, Impaired UE functional use, Pain  Visit Diagnosis: Stiffness of left shoulder, not elsewhere classified  Acute pain of left shoulder     Problem List Patient Active Problem List   Diagnosis Date Noted  . Breast cancer, left ([place]) 07/01/2016  . Breast cancer of upper-outer quadrant of left female breast ([place]) 04/19/2016  . Breast cancer of upper-outer quadrant of right female breast ([place]) 03/31/2016  . Acid reflux 11/20/2015  . Bunion of great toe of right foot 04/04/2015  . Hemorrhoid 11/23/2014  . Anal skin tag 11/23/2014  . Anal burning 11/23/2014  . Plantar fasciitis, right 05/11/2014  . Retrocalcaneal bursitis 05/11/2014  . Pain in joint, ankle and foot 05/11/2014  . Heart burn   . Joint pain   . Abnormal Pap smear   . Osteopenia      [place],DONNA 07/29/2016, [place], [place], [address] Phone: [phone]   Fax:  [phone]  Name: Sabrina Tapia MRN: [id number] Date of Birth: 1951-05-21   Serafina Royals, PT 07/29/16 5:02 PM

## 2016-07-29 NOTE — Telephone Encounter (Signed)
Oral Chemotherapy Pharmacist Encounter  Appeal packet for Xeloda prior authorization denial sent with letter of medical necessity and a copy of the article provided by Dr. Jana Hakim (NEJM 2017; [phone]): [phone]) as the basis for use of Xeloda in the adjuvant setting for breat cancer.  Appeal packet faxed to [facility] [place] appeals department at [phone].  This encounter will continue to be updated until final determination.  Oral Oncology Clinic will continue to follow.   Johny Drilling, PharmD, BCPS, BCOP 07/29/2016  1:31 PM Oral Oncology Clinic [phone]

## 2016-07-30 ENCOUNTER — Ambulatory Visit: Payer: BC Managed Care – PPO

## 2016-07-30 ENCOUNTER — Telehealth: Payer: Self-pay | Admitting: Pharmacist

## 2016-07-30 NOTE — Telephone Encounter (Signed)
Call to [facility] (ph# [phone]) to follow up on Appeal sent for Capecitabine yesterday (07/29/16). Appeal received and is pending review.   We were instructed to call back this Fri (08/01/16) to follow up on determination.  Kennith Center, Pharm.D., CPP 07/30/2016@12 :[facility]

## 2016-08-01 ENCOUNTER — Telehealth: Payer: Self-pay | Admitting: *Deleted

## 2016-08-01 NOTE — Telephone Encounter (Signed)
"  Sabrina Tapia calling to notify the Xeloda has been approved effective today for one year."

## 2016-08-04 ENCOUNTER — Ambulatory Visit: Payer: BC Managed Care – PPO | Admitting: Physical Therapy

## 2016-08-04 DIAGNOSIS — M25612 Stiffness of left shoulder, not elsewhere classified: Secondary | ICD-10-CM | POA: Diagnosis not present

## 2016-08-04 DIAGNOSIS — Z483 Aftercare following surgery for neoplasm: Secondary | ICD-10-CM

## 2016-08-04 DIAGNOSIS — M25512 Pain in left shoulder: Secondary | ICD-10-CM

## 2016-08-04 NOTE — Telephone Encounter (Signed)
Oral Chemotherapy Pharmacist Encounter  Received forwarded patient call that patient had called [facility] on 08/01/16 and informed the office that denial for Xeloda prior authorization has been overturned and that now the PA is approved.  I called [facility] ([facility]) in [place], [place] at [phone] to alert them of PA approval and to have the prescription re-processed.  They were able to process the prescription. Patient does have a copayment of $100. They will reach out to patient with copay information and will help patient find copayment support if she is eligible.  Oral Oncology Clinic will continue to follow.  Johny Drilling, PharmD, BCPS, BCOP 08/04/2016  1:35 PM Oral Oncology Clinic [phone]

## 2016-08-04 NOTE — Therapy (Signed)
[place], [place], [address] Phone: [phone]   Fax:  [phone]  Physical Therapy Treatment  Patient Details  Name: AROUSH Tapia MRN: [id number] Date of Birth: 1950/12/05 Referring Provider: Dr. Irene Limbo  Encounter Date: 08/04/2016      PT End of Session - 08/04/16 1308    Visit Number 3   Number of Visits 9   Date for PT Re-Evaluation 08/27/16   PT Start Time 0935   PT Stop Time 1015   PT Time Calculation (min) 40 min   Activity Tolerance Patient tolerated treatment well   Behavior During Therapy [facility] for tasks assessed/performed      Past Medical History:  Diagnosis Date  . Arthritis   . Asthma    allergy induced  . Cancer ([place]) 2017   bil breast cancer  . Cervical polyp   . Detrusor instability   . GERD (gastroesophageal reflux disease)   . Heart burn   . Hemorrhoids   . Hypertonicity of bladder   . Joint pain   . LGSIL (low grade squamous intraepithelial dysplasia) 2001   colpo biopsy negative, negative paps since  . Osteopenia 02/2016   T score -1.3 FRAX 8.3%/0.7% stable from prior DEXA    Past Surgical History:  Procedure Laterality Date  . BREAST RECONSTRUCTION WITH PLACEMENT OF TISSUE EXPANDER AND FLEX HD (ACELLULAR HYDRATED DERMIS) Left 07/01/2016   Procedure: BREAST RECONSTRUCTION WITH PLACEMENT OF TISSUE EXPANDER AND  ALLODERM;  Surgeon: Irene Limbo, MD;  Location: [place];  Service: Plastics;  Laterality: Left;  . INCISION AND DRAINAGE Left 06/02/2016   Procedure: DRAINAGE of left axilla seroma;  Surgeon: Rolm Bookbinder, MD;  Location: [place];  Service: General;  Laterality: Left;  . NIPPLE SPARING MASTECTOMY Left 07/01/2016   Procedure: LEFT NIPPLE SPARING MASTECTOMY;  Surgeon: Rolm Bookbinder, MD;  Location: [place];  Service: General;  Laterality: Left;  . RADIOACTIVE SEED GUIDED MASTECTOMY WITH  AXILLARY SENTINEL LYMPH NODE BIOPSY Right 03/26/2016   Procedure: RIGHT BREAST LUMPECTOMY WITH RADIOACTIVE SEED AND SENTINEL LYMPH NODE BIOPSY;  Surgeon: Rolm Bookbinder, MD;  Location: [place];  Service: General;  Laterality: Right;  . RADIOACTIVE SEED GUIDED MASTECTOMY WITH AXILLARY SENTINEL LYMPH NODE BIOPSY Left 05/20/2016   Procedure: LEFT BREAST RADIOACTIVE SEED (two seeds) GUIDED LUMPECTOMY WITH LEFT AXILLARY SENTINEL LYMPH NODE BIOPSY AND LEFT SEED TARGETED AXILLARY LYMPH NODE EXCISION;  Surgeon: Rolm Bookbinder, MD;  Location: [place];  Service: General;  Laterality: Left;  . RE-EXCISION OF BREAST CANCER,SUPERIOR MARGINS Right 05/20/2016   Procedure: RE-EXCISION OF RIGHT BREAST MARGIN;  Surgeon: Rolm Bookbinder, MD;  Location: [place];  Service: General;  Laterality: Right;  . RE-EXCISION OF BREAST CANCER,SUPERIOR MARGINS Left 06/02/2016   Procedure: RE-EXCISION OF BREAST CANCER,SUPERIOR MARGINS;  Surgeon: Rolm Bookbinder, MD;  Location: [place];  Service: General;  Laterality: Left;    There were no vitals filed for this visit.      Subjective Assessment - 08/04/16 0936    Subjective Got another fill and it didn't bother her as much this time.  Feels it more in the breast after doing her stretches than in the shoulder after the fill. Wonders if the tamoxifen is bothing some of her joints.  Did the exercises again starting a day and a half after the fill.   Currently in Pain? No/denies   Pain Descriptors / Indicators Other (  Comment)  "just discomfort"   Aggravating Factors  moving left arm            [facility] PT Assessment - 08/04/16 0001      AROM   Left Shoulder Flexion 147 Degrees   Left Shoulder ABduction 126 Degrees                     [facility] Adult PT Treatment/Exercise - 08/04/16 0001      Shoulder Exercises: Sidelying   Other Sidelying Exercises on right side, left UE D2 actively  x 5     Manual Therapy   Myofascial Release Left UE myofascial pulling in supine with movement into abduction   Scapular Mobilization In right sidelying for left scapula for protraction and depression.   Passive ROM in supine for left shoulder er, abduction, and flexion; also for horizontal abduction and moving toward scaption; left arm in position simulating XRT positioning, with stretch into er; in right sidelying, shoulder abduction (slight scaption) stretch                        Long Term Clinic Goals - 08/04/16 1312      CC Long Term Goal  #1   Title left shoulder active flexion 145 degrees for improved overhead reach   Baseline 133 degrees at re-eval on 07/25/16; 147 on 08/04/16   Status Achieved     CC Long Term Goal  #2   Title left shoulder active abduction 140 degrees for improved ADLs and to be able to get into position for XRT   Baseline 98 degrees at re-eval on 07/25/16; 126 on 08/04/16   Status Partially Met     CC Long Term Goal  #3   Title pain decreased at least 50% when reaching with left arm   Status On-going     CC Long Term Goal  #4   Title quick DASH score reduced to 20 or less   Baseline 68.18 at re-eval on 07/25/16   Status On-going            Plan - 08/04/16 1308    Clinical Impression Statement Patient doing well. She has made nice gains in active flexion and particularly active abduction of left shoulder since recent mastectomy/reconstruction.  She is able to get into position for XRT and will have simulation for that on 08/11/16. She states she does exercises at home but just can't do what we can do here for herself.   Rehab Potential Excellent   Clinical Impairments Affecting Rehab Potential recent mastectomy with expander placement; also to start radiation mid-January   PT Frequency 2x / week   PT Duration 4 weeks   PT Treatment/Interventions ADLs/Self Care Home Management;Therapeutic exercise;Patient/family education;Manual  techniques;Scar mobilization;Passive range of motion   PT Next Visit Plan Continue P/AA/A/ROM and work with patient to progress her HEP for left shoulder ROM.   PT Home Exercise Plan BMDC exercises and lying supine over towel roll; lying supine with hands overhead and elbows bent to simulate radiation positioning   Consulted and Agree with Plan of Care Patient      Patient will benefit from skilled therapeutic intervention in order to improve the following deficits and impairments:  Decreased knowledge of precautions, Decreased knowledge of use of DME, Decreased range of motion, Decreased strength, Impaired UE functional use, Pain  Visit Diagnosis: Stiffness of left shoulder, not elsewhere classified  Acute pain of left shoulder  Aftercare following surgery  for neoplasm     Problem List Patient Active Problem List   Diagnosis Date Noted  . Breast cancer, left ([place]) 07/01/2016  . Breast cancer of upper-outer quadrant of left female breast ([place]) 04/19/2016  . Breast cancer of upper-outer quadrant of right female breast ([place]) 03/31/2016  . Acid reflux 11/20/2015  . Bunion of great toe of right foot 04/04/2015  . Hemorrhoid 11/23/2014  . Anal skin tag 11/23/2014  . Anal burning 11/23/2014  . Plantar fasciitis, right 05/11/2014  . Retrocalcaneal bursitis 05/11/2014  . Pain in joint, ankle and foot 05/11/2014  . Heart burn   . Joint pain   . Abnormal Pap smear   . Osteopenia     Amarys Sliwinski 08/04/2016, 1:15 PM  [place], [place], [address] Phone: [phone]   Fax:  [phone]  Name: Sabrina Tapia MRN: [id number] Date of Birth: 04-Jan-1951  Serafina Royals, PT 08/04/16 1:15 PM

## 2016-08-06 ENCOUNTER — Ambulatory Visit: Payer: BC Managed Care – PPO | Admitting: Physical Therapy

## 2016-08-06 DIAGNOSIS — M25512 Pain in left shoulder: Secondary | ICD-10-CM

## 2016-08-06 DIAGNOSIS — Z483 Aftercare following surgery for neoplasm: Secondary | ICD-10-CM

## 2016-08-06 DIAGNOSIS — M25612 Stiffness of left shoulder, not elsewhere classified: Secondary | ICD-10-CM | POA: Diagnosis not present

## 2016-08-06 NOTE — Therapy (Signed)
[place], [place], [address] Phone: [phone]   Fax:  [phone]  Physical Therapy Treatment  Patient Details  Name: Sabrina Tapia MRN: [id number] Date of Birth: [birth year] Referring Provider: Dr. Irene Limbo  Encounter Date: 08/06/2016      PT End of Session - 08/06/16 1149    Visit Number 4   Number of Visits 9   Date for PT Re-Evaluation 08/27/16   PT Start Time 1102   PT Stop Time 1146   PT Time Calculation (min) 44 min   Activity Tolerance Patient tolerated treatment well   Behavior During Therapy [facility] for tasks assessed/performed      Past Medical History:  Diagnosis Date  . Arthritis   . Asthma    allergy induced  . Cancer ([place]) 2017   bil breast cancer  . Cervical polyp   . Detrusor instability   . GERD (gastroesophageal reflux disease)   . Heart burn   . Hemorrhoids   . Hypertonicity of bladder   . Joint pain   . LGSIL (low grade squamous intraepithelial dysplasia) 2001   colpo biopsy negative, negative paps since  . Osteopenia 02/2016   T score -1.3 FRAX 8.3%/0.7% stable from prior DEXA    Past Surgical History:  Procedure Laterality Date  . BREAST RECONSTRUCTION WITH PLACEMENT OF TISSUE EXPANDER AND FLEX HD (ACELLULAR HYDRATED DERMIS) Left 07/01/2016   Procedure: BREAST RECONSTRUCTION WITH PLACEMENT OF TISSUE EXPANDER AND  ALLODERM;  Surgeon: Irene Limbo, MD;  Location: [place];  Service: Plastics;  Laterality: Left;  . INCISION AND DRAINAGE Left 06/02/2016   Procedure: DRAINAGE of left axilla seroma;  Surgeon: Rolm Bookbinder, MD;  Location: [place];  Service: General;  Laterality: Left;  . NIPPLE SPARING MASTECTOMY Left 07/01/2016   Procedure: LEFT NIPPLE SPARING MASTECTOMY;  Surgeon: Rolm Bookbinder, MD;  Location: [place];  Service: General;  Laterality: Left;  . RADIOACTIVE SEED GUIDED MASTECTOMY  WITH AXILLARY SENTINEL LYMPH NODE BIOPSY Right 03/26/2016   Procedure: RIGHT BREAST LUMPECTOMY WITH RADIOACTIVE SEED AND SENTINEL LYMPH NODE BIOPSY;  Surgeon: Rolm Bookbinder, MD;  Location: [place];  Service: General;  Laterality: Right;  . RADIOACTIVE SEED GUIDED MASTECTOMY WITH AXILLARY SENTINEL LYMPH NODE BIOPSY Left 05/20/2016   Procedure: LEFT BREAST RADIOACTIVE SEED (two seeds) GUIDED LUMPECTOMY WITH LEFT AXILLARY SENTINEL LYMPH NODE BIOPSY AND LEFT SEED TARGETED AXILLARY LYMPH NODE EXCISION;  Surgeon: Rolm Bookbinder, MD;  Location: [place];  Service: General;  Laterality: Left;  . RE-EXCISION OF BREAST CANCER,SUPERIOR MARGINS Right 05/20/2016   Procedure: RE-EXCISION OF RIGHT BREAST MARGIN;  Surgeon: Rolm Bookbinder, MD;  Location: [place];  Service: General;  Laterality: Right;  . RE-EXCISION OF BREAST CANCER,SUPERIOR MARGINS Left 06/02/2016   Procedure: RE-EXCISION OF BREAST CANCER,SUPERIOR MARGINS;  Surgeon: Rolm Bookbinder, MD;  Location: [place];  Service: General;  Laterality: Left;    There were no vitals filed for this visit.      Subjective Assessment - 08/06/16 1104    Subjective Got sore againafter last visit at shoulder, left axilla and left breast (expander).  It didn't stay sore as long.   Currently in Pain? No/denies                         [facility] Adult PT Treatment/Exercise - 08/06/16 0001      Shoulder Exercises: Sidelying  Other Sidelying Exercises on right side, left UE D2 actively x 6     Manual Therapy   Myofascial Release Left UE myofascial pulling in supine with movement into abduction   Scapular Mobilization In right sidelying for left scapula for protraction and depression.   Passive ROM in supine for left shoulder er, abduction, and flexion; also for horizontal abduction and moving toward scaption; left arm in position simulating XRT positioning, with stretch  into er; in right sidelying, shoulder abduction (slight scaption) stretch                        Long Term Clinic Goals - 08/04/16 1312      CC Long Term Goal  #1   Title left shoulder active flexion 145 degrees for improved overhead reach   Baseline 133 degrees at re-eval on 07/25/16; 147 on 08/04/16   Status Achieved     CC Long Term Goal  #2   Title left shoulder active abduction 140 degrees for improved ADLs and to be able to get into position for XRT   Baseline 98 degrees at re-eval on 07/25/16; 126 on 08/04/16   Status Partially Met     CC Long Term Goal  #3   Title pain decreased at least 50% when reaching with left arm   Status On-going     CC Long Term Goal  #4   Title quick DASH score reduced to 20 or less   Baseline 68.18 at re-eval on 07/25/16   Status On-going            Plan - 08/06/16 1149    Clinical Impression Statement Continues to do well tolerating fairly vigorous stretching of left shoulder/chest area.  She should be able to get into position for simulation and XRT.   Rehab Potential Excellent   Clinical Impairments Affecting Rehab Potential recent mastectomy with expander placement; also to start radiation mid-January   PT Frequency 2x / week   PT Duration 4 weeks   PT Treatment/Interventions ADLs/Self Care Home Management;Therapeutic exercise;Patient/family education;Manual techniques;Scar mobilization;Passive range of motion   PT Next Visit Plan Continue P/AA/A/ROM and work with patient to progress her HEP for left shoulder ROM.   PT Home Exercise Plan BMDC exercises and lying supine over towel roll; lying supine with hands overhead and elbows bent to simulate radiation positioning   Consulted and Agree with Plan of Care Patient      Patient will benefit from skilled therapeutic intervention in order to improve the following deficits and impairments:  Decreased knowledge of precautions, Decreased knowledge of use of DME, Decreased range  of motion, Decreased strength, Impaired UE functional use, Pain  Visit Diagnosis: Stiffness of left shoulder, not elsewhere classified  Acute pain of left shoulder  Aftercare following surgery for neoplasm     Problem List Patient Active Problem List   Diagnosis Date Noted  . Breast cancer, left ([place]) 07/01/2016  . Breast cancer of upper-outer quadrant of left female breast ([place]) 04/19/2016  . Breast cancer of upper-outer quadrant of right female breast ([place]) 03/31/2016  . Acid reflux 11/20/2015  . Bunion of great toe of right foot 04/04/2015  . Hemorrhoid 11/23/2014  . Anal skin tag 11/23/2014  . Anal burning 11/23/2014  . Plantar fasciitis, right 05/11/2014  . Retrocalcaneal bursitis 05/11/2014  . Pain in joint, ankle and foot 05/11/2014  . Heart burn   . Joint pain   . Abnormal Pap smear   .  Osteopenia     SALISBURY,DONNA 08/06/2016, 11:57 AM  [place], [place], [address] Phone: [phone]   Fax:  [phone]  Name: YUKTHA KERCHNER MRN: [id number] Date of Birth: 04-Oct-1950   Serafina Royals, PT 08/06/16 11:57 AM

## 2016-08-11 ENCOUNTER — Ambulatory Visit
Admission: RE | Admit: 2016-08-11 | Discharge: 2016-08-11 | Disposition: A | Discharge status: Home / Self Care | Payer: BC Managed Care – PPO | Source: Ambulatory Visit | Attending: Radiation Oncology | Admitting: Radiation Oncology

## 2016-08-11 DIAGNOSIS — C50411 Malignant neoplasm of upper-outer quadrant of right female breast: Secondary | ICD-10-CM

## 2016-08-11 DIAGNOSIS — Z510 Encounter for antineoplastic radiation therapy: Secondary | ICD-10-CM | POA: Diagnosis not present

## 2016-08-11 DIAGNOSIS — Z170 Estrogen receptor positive status [ER+]: Secondary | ICD-10-CM

## 2016-08-11 DIAGNOSIS — C50412 Malignant neoplasm of upper-outer quadrant of left female breast: Secondary | ICD-10-CM

## 2016-08-11 NOTE — Progress Notes (Signed)
  Radiation Oncology         [phone]) [phone] ________________________________  Name: Sabrina Tapia MRN: [id number]  Date: 08/11/2016  DOB: July 06, 1951  SIMULATION AND TREATMENT PLANNING NOTE    Outpatient  DIAGNOSIS:     ICD-9-CM ICD-10-CM   1. Malignant neoplasm of upper-outer quadrant of right breast in female, estrogen receptor positive ([facility]) 174.4 C50.411    V86.0 Z17.0   2. Malignant neoplasm of upper-outer quadrant of left female breast, unspecified estrogen receptor status ([facility]) 174.4 C50.412     NARRATIVE:  The patient was brought to the [facility].  Identity was confirmed.  All relevant records and images related to the planned course of therapy were reviewed.  The patient freely provided informed written consent to proceed with treatment after reviewing the details related to the planned course of therapy. The consent form was witnessed and verified by the simulation staff.    Then, the patient was set-up in a stable reproducible supine position for radiation therapy with her ipsilateral arm over her head, and her upper body secured in a custom-made Vac-lok device.  CT images were obtained.  Surface markings were placed.  The CT images were loaded into the planning software.    TREATMENT PLANNING NOTE: Treatment planning then occurred.  The radiation prescription was entered and confirmed.     A total of 7 medically necessary complex treatment devices were fabricated and supervised by me: 6 fields with MLCs for custom blocks to protect heart, esophagus, cord, and lungs;  and, a Vac-lok. MORE COMPLEX DEVICES MAY BE MADE IN DOSIMETRY FOR FIELD IN FIELD BEAMS FOR DOSE HOMOGENEITY.  I have requested : 3D Simulation which is medically necessary to give adequate dose to at risk tissues while sparing lungs and heart.  I have requested a DVH of the following structures: lungs, heart, esophagus, cord, right lumpectomy cavity.    The patient will receive 50.4 Gy in 28  fractions to the left chest wall and regional nodes with 4 fields and 50 Gy in 25 fractions to the right breast/axilla with 2 high tangential fields.   This will be followed by a boost to the right breast only.   Optical Surface Tracking Plan:  Since intensity modulated radiotherapy (IMRT) and 3D conformal radiation treatment methods are predicated on accurate and precise positioning for treatment, intrafraction motion monitoring is medically necessary to ensure accurate and safe treatment delivery. The ability to quantify intrafraction motion without excessive ionizing radiation dose can only be performed with optical surface tracking. Accordingly, surface imaging offers the opportunity to obtain 3D measurements of patient position throughout IMRT and 3D treatments without excessive radiation exposure. I am ordering optical surface tracking for this patient's upcoming course of radiotherapy.  ________________________________   Reference:  Ursula Alert, J, et al. Surface imaging-based analysis of intrafraction motion for breast radiotherapy patients.Journal of [place], n. 6, nov. 2014. ISSN [id number].  Available at: <http://www.jacmp.org/index.php/jacmp/article/view/4957>.    -----------------------------------  Eppie Gibson, MD

## 2016-08-12 ENCOUNTER — Ambulatory Visit: Payer: BC Managed Care – PPO | Admitting: Physical Therapy

## 2016-08-12 DIAGNOSIS — Z483 Aftercare following surgery for neoplasm: Secondary | ICD-10-CM

## 2016-08-12 DIAGNOSIS — M25612 Stiffness of left shoulder, not elsewhere classified: Secondary | ICD-10-CM

## 2016-08-12 DIAGNOSIS — M25512 Pain in left shoulder: Secondary | ICD-10-CM

## 2016-08-12 NOTE — Therapy (Signed)
[place], [place], [address] Phone: [phone]   Fax:  [phone]  Physical Therapy Treatment  Patient Details  Name: Sabrina Tapia MRN: [id number] Date of Birth: [birth year] Referring Provider: Dr. Irene Limbo  Encounter Date: 08/12/2016      PT End of Session - 08/12/16 1530    Visit Number 5   Number of Visits 9   Date for PT Re-Evaluation 08/27/16   PT Start Time 6712   PT Stop Time 1515   PT Time Calculation (min) 42 min   Activity Tolerance Patient tolerated treatment well   Behavior During Therapy [facility] for tasks assessed/performed      Past Medical History:  Diagnosis Date  . Arthritis   . Asthma    allergy induced  . Cancer ([place]) 2017   bil breast cancer  . Cervical polyp   . Detrusor instability   . GERD (gastroesophageal reflux disease)   . Heart burn   . Hemorrhoids   . Hypertonicity of bladder   . Joint pain   . LGSIL (low grade squamous intraepithelial dysplasia) 2001   colpo biopsy negative, negative paps since  . Osteopenia 02/2016   T score -1.3 FRAX 8.3%/0.7% stable from prior DEXA    Past Surgical History:  Procedure Laterality Date  . BREAST RECONSTRUCTION WITH PLACEMENT OF TISSUE EXPANDER AND FLEX HD (ACELLULAR HYDRATED DERMIS) Left 07/01/2016   Procedure: BREAST RECONSTRUCTION WITH PLACEMENT OF TISSUE EXPANDER AND  ALLODERM;  Surgeon: Irene Limbo, MD;  Location: [facility];  Service: Plastics;  Laterality: Left;  . INCISION AND DRAINAGE Left 06/02/2016   Procedure: DRAINAGE of left axilla seroma;  Surgeon: Rolm Bookbinder, MD;  Location: [facility];  Service: General;  Laterality: Left;  . NIPPLE SPARING MASTECTOMY Left 07/01/2016   Procedure: LEFT NIPPLE SPARING MASTECTOMY;  Surgeon: Rolm Bookbinder, MD;  Location: [facility];  Service: General;  Laterality: Left;  . RADIOACTIVE SEED GUIDED MASTECTOMY  WITH AXILLARY SENTINEL LYMPH NODE BIOPSY Right 03/26/2016   Procedure: RIGHT BREAST LUMPECTOMY WITH RADIOACTIVE SEED AND SENTINEL LYMPH NODE BIOPSY;  Surgeon: Rolm Bookbinder, MD;  Location: [facility];  Service: General;  Laterality: Right;  . RADIOACTIVE SEED GUIDED MASTECTOMY WITH AXILLARY SENTINEL LYMPH NODE BIOPSY Left 05/20/2016   Procedure: LEFT BREAST RADIOACTIVE SEED (two seeds) GUIDED LUMPECTOMY WITH LEFT AXILLARY SENTINEL LYMPH NODE BIOPSY AND LEFT SEED TARGETED AXILLARY LYMPH NODE EXCISION;  Surgeon: Rolm Bookbinder, MD;  Location: [facility];  Service: General;  Laterality: Left;  . RE-EXCISION OF BREAST CANCER,SUPERIOR MARGINS Right 05/20/2016   Procedure: RE-EXCISION OF RIGHT BREAST MARGIN;  Surgeon: Rolm Bookbinder, MD;  Location: [facility];  Service: General;  Laterality: Right;  . RE-EXCISION OF BREAST CANCER,SUPERIOR MARGINS Left 06/02/2016   Procedure: RE-EXCISION OF BREAST CANCER,SUPERIOR MARGINS;  Surgeon: Rolm Bookbinder, MD;  Location: [facility];  Service: General;  Laterality: Left;    There were no vitals filed for this visit.      Subjective Assessment - 08/12/16 1436    Subjective Had radiation simulation yesterday; got tattoos.  They discussed it at conference and they're going to lighten up with treatment to the left breast, but they've got to do the underarm because of positive lymph nodes there.  Will go in Monday for check and starts treatment Tuesday. Met with her yoga instructor and did well with that, so wants today to be discharge day.  I  woke up sometime over the weekend and I just felt like something was different (with her arm motion).   Currently in Pain? No/denies            [facility] PT Assessment - 08/12/16 0001      Observation/Other Assessments   Quick DASH  13.64     AROM   Left Shoulder Flexion 150 Degrees   Left Shoulder ABduction 165 Degrees              Quick Dash  - 08/12/16 0001    Open a tight or new jar No difficulty   Do heavy household chores (wash walls, wash floors) Mild difficulty   Carry a shopping bag or briefcase No difficulty   Wash your back No difficulty   Use a knife to cut food No difficulty   Recreational activities in which you take some force or impact through your arm, shoulder, or hand (golf, hammering, tennis) Moderate difficulty   During the past week, to what extent has your arm, shoulder or hand problem interfered with your normal social activities with family, friends, neighbors, or groups? Not at all   During the past week, to what extent has your arm, shoulder or hand problem limited your work or other regular daily activities Slightly   Arm, shoulder, or hand pain. Moderate   Tingling (pins and needles) in your arm, shoulder, or hand None   Difficulty Sleeping No difficulty   DASH Score 13.64 %               [facility] Adult PT Treatment/Exercise - 08/12/16 0001      Manual Therapy   Myofascial Release Left UE myofascial pulling in supine with movement into abduction   Scapular Mobilization In right sidelying for left scapula for protraction and depression.   Passive ROM in supine for left shoulder er, abduction, and flexion; also for horizontal abduction and scaption; in right sidelying for left shoulder abduction                        Long Term Clinic Goals - 08/12/16 1439      CC Long Term Goal  #1   Title left shoulder active flexion 145 degrees for improved overhead reach   Baseline 133 degrees at re-eval on 07/25/16; 147 on 08/04/16; 150 on 08/12/16   Status Achieved     CC Long Term Goal  #2   Title left shoulder active abduction 140 degrees for improved ADLs and to be able to get into position for XRT   Baseline 98 degrees at re-eval on 07/25/16; 126 on 08/04/16; 165 on 08/12/16   Status Achieved     CC Long Term Goal  #3   Title pain decreased at least 50% when reaching with left arm    Baseline 75-80% improvement reported at this time 06/17/16; then 100% on 08/12/16   Status Achieved     CC Long Term Goal  #4   Title quick DASH score reduced to 20 or less   Baseline 68.18 at re-eval on 07/25/16; 13.64 on 08/12/16   Status Achieved            Plan - 08/12/16 1531    Clinical Impression Statement Pt. feels she turned a corner over the weekend and that today she can be discharged.  Indeed, she has met all of her goals as of today.  She will start XRT next week and knows that she could be  re-referred if she needs further therapy.   Rehab Potential Excellent   Clinical Impairments Affecting Rehab Potential recent mastectomy with expander placement; also to start radiation mid-January   PT Treatment/Interventions ADLs/Self Care Home Management;Therapeutic exercise;Patient/family education;Manual techniques;Scar mobilization;Passive range of motion   PT Next Visit Plan None; discharge today.   PT Home Exercise Plan BMDC exercises and lying supine over towel roll; lying supine with hands overhead and elbows bent to simulate radiation positioning; all other HEP   Consulted and Agree with Plan of Care Patient      Patient will benefit from skilled therapeutic intervention in order to improve the following deficits and impairments:  Decreased knowledge of precautions, Decreased knowledge of use of DME, Decreased range of motion, Decreased strength, Impaired UE functional use, Pain  Visit Diagnosis: Stiffness of left shoulder, not elsewhere classified  Acute pain of left shoulder  Aftercare following surgery for neoplasm     Problem List Patient Active Problem List   Diagnosis Date Noted  . Breast cancer, left ([place]) 07/01/2016  . Breast cancer of upper-outer quadrant of left female breast ([place]) 04/19/2016  . Breast cancer of upper-outer quadrant of right female breast ([place]) 03/31/2016  . Acid reflux 11/20/2015  . Bunion of great toe of right foot 04/04/2015  .  Hemorrhoid 11/23/2014  . Anal skin tag 11/23/2014  . Anal burning 11/23/2014  . Plantar fasciitis, right 05/11/2014  . Retrocalcaneal bursitis 05/11/2014  . Pain in joint, ankle and foot 05/11/2014  . Heart burn   . Joint pain   . Abnormal Pap smear   . Osteopenia     SALISBUR[place],DONNA 08/12/2016, 3:33 PM  [place], [place], [address] Phone: [phone]   Fax:  [phone]  Name: Sabrina Tapia MRN: [id number] Date of Birth: 10/12/1950  PHYSICAL THERAPY DISCHARGE SUMMARY  Visits from Start of Care: 5  Current functional level related to goals / functional outcomes: All goals met as noted above.   Remaining deficits: Mild tightness in left shoulder area.   Education / Equipment: HEP for stretching. Plan: Patient agrees to discharge.  Patient goals were met. Patient is being discharged due to meeting the stated rehab goals.  ?????  She is also satisfied with her current functional level and volunteers to be discharged.  Serafina Royals, PT 08/12/16 3:35 PM

## 2016-08-15 ENCOUNTER — Encounter: Payer: Self-pay | Admitting: Physical Therapy

## 2016-08-15 DIAGNOSIS — Z510 Encounter for antineoplastic radiation therapy: Secondary | ICD-10-CM | POA: Diagnosis not present

## 2016-08-18 ENCOUNTER — Ambulatory Visit
Admission: RE | Admit: 2016-08-18 | Discharge: 2016-08-18 | Disposition: A | Discharge status: Home / Self Care | Payer: BC Managed Care – PPO | Source: Ambulatory Visit | Attending: Radiation Oncology | Admitting: Radiation Oncology

## 2016-08-18 ENCOUNTER — Encounter: Payer: Self-pay | Admitting: Physical Therapy

## 2016-08-18 DIAGNOSIS — Z510 Encounter for antineoplastic radiation therapy: Secondary | ICD-10-CM | POA: Diagnosis not present

## 2016-08-19 ENCOUNTER — Ambulatory Visit
Admission: RE | Admit: 2016-08-19 | Discharge: 2016-08-19 | Disposition: A | Discharge status: Home / Self Care | Payer: BC Managed Care – PPO | Source: Ambulatory Visit | Attending: Radiation Oncology | Admitting: Radiation Oncology

## 2016-08-19 DIAGNOSIS — Z510 Encounter for antineoplastic radiation therapy: Secondary | ICD-10-CM | POA: Diagnosis not present

## 2016-08-20 ENCOUNTER — Ambulatory Visit
Admission: RE | Admit: 2016-08-20 | Discharge: 2016-08-20 | Disposition: A | Discharge status: Home / Self Care | Payer: BC Managed Care – PPO | Source: Ambulatory Visit | Attending: Radiation Oncology | Admitting: Radiation Oncology

## 2016-08-20 ENCOUNTER — Encounter: Payer: Self-pay | Admitting: Physical Therapy

## 2016-08-20 DIAGNOSIS — Z170 Estrogen receptor positive status [ER+]: Secondary | ICD-10-CM | POA: Diagnosis not present

## 2016-08-20 DIAGNOSIS — Z510 Encounter for antineoplastic radiation therapy: Secondary | ICD-10-CM | POA: Diagnosis not present

## 2016-08-20 DIAGNOSIS — C50411 Malignant neoplasm of upper-outer quadrant of right female breast: Secondary | ICD-10-CM

## 2016-08-20 DIAGNOSIS — C50412 Malignant neoplasm of upper-outer quadrant of left female breast: Secondary | ICD-10-CM

## 2016-08-20 MED ORDER — ALRA NON-METALLIC DEODORANT (RAD-ONC)
1.0000 "application " | Freq: Once | TOPICAL | Status: AC
  Administered 2016-08-20: 1 via TOPICAL

## 2016-08-20 MED ORDER — RADIAPLEXRX EX GEL
Freq: Once | CUTANEOUS | Status: AC
  Administered 2016-08-20: 12:00:00 via TOPICAL

## 2016-08-20 NOTE — Progress Notes (Signed)

## 2016-08-21 ENCOUNTER — Ambulatory Visit
Admission: RE | Admit: 2016-08-21 | Discharge: 2016-08-21 | Disposition: A | Discharge status: Home / Self Care | Payer: BC Managed Care – PPO | Source: Ambulatory Visit | Attending: Radiation Oncology | Admitting: Radiation Oncology

## 2016-08-21 DIAGNOSIS — Z510 Encounter for antineoplastic radiation therapy: Secondary | ICD-10-CM | POA: Diagnosis not present

## 2016-08-22 ENCOUNTER — Ambulatory Visit
Admission: RE | Admit: 2016-08-22 | Discharge: 2016-08-22 | Disposition: A | Discharge status: Home / Self Care | Payer: BC Managed Care – PPO | Source: Ambulatory Visit | Attending: Radiation Oncology | Admitting: Radiation Oncology

## 2016-08-22 DIAGNOSIS — Z510 Encounter for antineoplastic radiation therapy: Secondary | ICD-10-CM | POA: Diagnosis not present

## 2016-08-25 ENCOUNTER — Ambulatory Visit
Admission: RE | Admit: 2016-08-25 | Discharge: 2016-08-25 | Disposition: A | Discharge status: Home / Self Care | Payer: BC Managed Care – PPO | Source: Ambulatory Visit | Attending: Radiation Oncology | Admitting: Radiation Oncology

## 2016-08-25 ENCOUNTER — Encounter: Payer: Self-pay | Admitting: Radiation Oncology

## 2016-08-25 VITALS — BP 124/83 | HR 79 | Temp 98.3°F | Ht 64.0 in | Wt 149.2 lb

## 2016-08-25 DIAGNOSIS — Z510 Encounter for antineoplastic radiation therapy: Secondary | ICD-10-CM | POA: Diagnosis not present

## 2016-08-25 DIAGNOSIS — Z170 Estrogen receptor positive status [ER+]: Secondary | ICD-10-CM

## 2016-08-25 DIAGNOSIS — C50412 Malignant neoplasm of upper-outer quadrant of left female breast: Secondary | ICD-10-CM

## 2016-08-25 DIAGNOSIS — C50411 Malignant neoplasm of upper-outer quadrant of right female breast: Secondary | ICD-10-CM

## 2016-08-25 NOTE — Progress Notes (Signed)
Sabrina Tapia presents for her 5th fraction of radiation to her Left Chest wall, and Right Breast. She denies pain. She reports some mild fatigue this past Saturday. She did report some tightness to her Right Axilla this past week, but has improved today. Her Right and Left Chest are normal appearing. She does have a few red bumps to the center of her Chest. These areas do not itch. She is using Radiaplex cream twice daily.   BP 124/83   Pulse 79   Temp 98.3 F (36.8 C)   Ht 5\' 4"  (1.626 m)   Wt 149 lb 3.2 oz (67.7 kg)   SpO2 99% Comment: room air  BMI 25.61 kg/m    Wt Readings from Last 3 Encounters:  08/25/16 149 lb 3.2 oz (67.7 kg)  07/17/16 147 lb 14.4 oz (67.1 kg)  07/15/16 147 lb 3.2 oz (66.8 kg)

## 2016-08-25 NOTE — Progress Notes (Signed)
   Weekly Management Note:  Outpatient    ICD-9-CM ICD-10-CM   1. Malignant neoplasm of upper-outer quadrant of left breast in female, estrogen receptor positive (HCC) 174.4 C50.412    V86.0 Z17.0   2. Malignant neoplasm of upper-outer quadrant of right breast in female, estrogen receptor positive ([place]) 174.4 C50.411    V86.0 Z17.0     Current Dose:  9Gy to left chest wall and nodes / 10Gy to right breast/axilla Projected Dose: 50.4 Gy / 60Gy  Narrative:  The patient presents for routine under treatment assessment.  CBCT/MVCT images/Port film x-rays were reviewed.  The chart was checked. Doing well.  Temporarily right axillary tightness last week.  Physical Findings:  height is 5\' 4"  (1.626 m) and weight is 149 lb 3.2 oz (67.7 kg). Her temperature is 98.3 F (36.8 C). Her blood pressure is 124/83 and her pulse is 79. Her oxygen saturation is 99%.   Wt Readings from Last 3 Encounters:  08/25/16 149 lb 3.2 oz (67.7 kg)  07/17/16 147 lb 14.4 oz (67.1 kg)  07/15/16 147 lb 3.2 oz (66.8 kg)   No obvious skin irritation so far over chest  Impression:  The patient is tolerating radiotherapy.  Plan:  Continue radiotherapy as planned. Patient instructed to apply Radiplex to intact skin in treatment fields.     ________________________________   Eppie Gibson, M.D.

## 2016-08-26 ENCOUNTER — Ambulatory Visit
Admission: RE | Admit: 2016-08-26 | Discharge: 2016-08-26 | Disposition: A | Discharge status: Home / Self Care | Payer: BC Managed Care – PPO | Source: Ambulatory Visit | Attending: Radiation Oncology | Admitting: Radiation Oncology

## 2016-08-26 DIAGNOSIS — Z510 Encounter for antineoplastic radiation therapy: Secondary | ICD-10-CM | POA: Diagnosis not present

## 2016-08-27 ENCOUNTER — Ambulatory Visit
Admission: RE | Admit: 2016-08-27 | Discharge: 2016-08-27 | Disposition: A | Discharge status: Home / Self Care | Payer: BC Managed Care – PPO | Source: Ambulatory Visit | Attending: Radiation Oncology | Admitting: Radiation Oncology

## 2016-08-27 ENCOUNTER — Ambulatory Visit (HOSPITAL_BASED_OUTPATIENT_CLINIC_OR_DEPARTMENT_OTHER): Payer: BC Managed Care – PPO | Admitting: Oncology

## 2016-08-27 VITALS — BP 114/94 | HR 81 | Temp 97.9°F | Resp 20 | Ht 64.0 in | Wt 150.5 lb

## 2016-08-27 DIAGNOSIS — C50112 Malignant neoplasm of central portion of left female breast: Secondary | ICD-10-CM

## 2016-08-27 DIAGNOSIS — C50411 Malignant neoplasm of upper-outer quadrant of right female breast: Secondary | ICD-10-CM | POA: Diagnosis not present

## 2016-08-27 DIAGNOSIS — Z510 Encounter for antineoplastic radiation therapy: Secondary | ICD-10-CM | POA: Diagnosis not present

## 2016-08-27 DIAGNOSIS — Z170 Estrogen receptor positive status [ER+]: Secondary | ICD-10-CM

## 2016-08-27 DIAGNOSIS — C773 Secondary and unspecified malignant neoplasm of axilla and upper limb lymph nodes: Secondary | ICD-10-CM

## 2016-08-27 DIAGNOSIS — C50412 Malignant neoplasm of upper-outer quadrant of left female breast: Secondary | ICD-10-CM | POA: Diagnosis not present

## 2016-08-27 NOTE — Progress Notes (Signed)
Sabrina Tapia  [phone] [phone]     ID: Sabrina Tapia DOB: 1951/01/15  MR#: [id number]  JHE#:[id number]  Patient Care Team: Josetta Huddle, MD as PCP - General (Internal Medicine) Rolm Bookbinder, MD as Consulting Physician (General Surgery) Chauncey Cruel, MD as Consulting Physician (Oncology) Anastasio Auerbach, MD as Consulting Physician (Gynecology) OTHER MD:  CHIEF COMPLAINT: Estrogen receptor positive breast cancer  CURRENT TREATMENT: Undergoing adjuvant radiation with Xeloda as sensitization   BREAST CANCER HISTORY: From the earlier summary note:  "Sabrina Tapia" had screening mammography at her gynecologist's office suggestive of a change in the upper-outer quadrant of the right breast. She was referred to [facility] where on 03/04/2016 she underwent bilateral diagnostic mammography and right breast ultrasonography. The breast density was category C. In the upper outer quadrant of the right breast there was a new area of architectural distortion. By ultrasonography this measured 0.5 cm. The right axilla was reported as sonographically benign.  On 03/06/2016 she underwent core needle biopsy of the right breast mass in question, and this showed ([id number]) an invasive ductal carcinoma, grade 1, estrogen receptor 95% positive, progesterone receptor 95% positive, both with strong staining intensity, with an MIB-1 of 12%, and no HER-2 amplification, the signals ratio being 1.54 and the number per cell 2.15.  Her case was presented in the multidisciplinary breast cancer conference 03/12/2016.Marland Kitchen At that time a preliminary plan was proposed: Breast conserving surgery with sentinel lymph node sampling, no Oncotype if the tumor was no larger than 5 mm, and consideration of genetics testing.  On 03/26/2016 Sabrina Tapia underwent right lumpectomy and sentinel lymph node sampling. This showed ([id number]) an invasive ductal carcinoma measuring 1.4 cm, grade 1, with  negative margins, and a macrometastatic deposit of ductal carcinoma in the single sentinel lymph node. Mammaprint from this tumor was read as "low risk".  However there was a separate invasive lobular carcinoma measuring 0.6 cm,  focally involving the final right medial margin. Because of the lobular breast cancer bilateral breast MRI was obtained 04/04/2016. This showed in the right breast a 7.4 x 4.5 cm seroma with 2 enhancing masses posterior to the lumpectomy cavity, the larger measuring 1.0 cm. These were felt possibly to be reactive lymph nodes. One of these lymph nodes was biopsied 04/09/2016 and this showed ([id number]) benign lymph node tissue. The MRI showed no evidence of additional disease in the right breast.  However in the left breast centrally there was a 1.3 cm enhancing mass. Biopsy of this left breast mass 04/09/2016 ([id number]) showed an invasive ductal carcinoma, grade 1 or 2, with insufficient tissue for a prognostic panel. On 04/11/2016 biopsy of a second left breast mass, upper outer quadrant, 4.6 cm a way from the other left breast mass, showed ([id number]) an invasive ductal carcinoma, grade 2, estrogen receptor 10% positive with strong staining intensity, progesterone receptor negative, with an MIB-1 of 2%, and no HER-2 amplification, the signals ratio being 1.50 and the number per cell 2.10. The proliferation fraction was 2%.  On 04/14/2016 Sabrina Tapia underwent biopsy of a suspicious left axillary lymph node and this ([id number]) was positive for invasive ductal carcinoma, estrogen and progesterone receptor negative, with an MIB-1 of 10%. I do not find that HER-2 was repeated. Mammaprint from this tumor also came back low risk.  Her subsequent history is as detailed below.  INTERVAL HISTORY: Sabrina Tapia returns today for follow-up of her estrogen receptor positive breast cancer. She is doing "fine" with  radiation so far, having had the first week of treatment. She is also  receiving capecitabine on treatment days, at sensitization doses, namely 1 g by mouth twice a day. She is tolerating this well, with no mouth sores, diarrhea, or palmar plantar erythrodysesthesia  REVIEW OF SYSTEMS: Sabrina Tapia feels achy sometimes. This is not more intense or persistent than before. She went off Vesicare and that seemed to help area she has hot flashes which are moderate. She has no problems with vaginal dryness or vaginal wetness. She is normally active in a detailed review of systems today was otherwise noncontributory  PAST MEDICAL HISTORY: Past Medical History:  Diagnosis Date  . Arthritis   . Asthma    allergy induced  . Cancer ([place]) 2017   bil breast cancer  . Cervical polyp   . Detrusor instability   . GERD (gastroesophageal reflux disease)   . Heart burn   . Hemorrhoids   . Hypertonicity of bladder   . Joint pain   . LGSIL (low grade squamous intraepithelial dysplasia) 2001   colpo biopsy negative, negative paps since  . Osteopenia 02/2016   T score -1.3 FRAX 8.3%/0.7% stable from prior DEXA    PAST SURGICAL HISTORY: Past Surgical History:  Procedure Laterality Date  . BREAST RECONSTRUCTION WITH PLACEMENT OF TISSUE EXPANDER AND FLEX HD (ACELLULAR HYDRATED DERMIS) Left 07/01/2016   Procedure: BREAST RECONSTRUCTION WITH PLACEMENT OF TISSUE EXPANDER AND  ALLODERM;  Surgeon: Irene Limbo, MD;  Location: [place];  Service: Plastics;  Laterality: Left;  . INCISION AND DRAINAGE Left 06/02/2016   Procedure: DRAINAGE of left axilla seroma;  Surgeon: Rolm Bookbinder, MD;  Location: [place];  Service: General;  Laterality: Left;  . NIPPLE SPARING MASTECTOMY Left 07/01/2016   Procedure: LEFT NIPPLE SPARING MASTECTOMY;  Surgeon: Rolm Bookbinder, MD;  Location: [place];  Service: General;  Laterality: Left;  . RADIOACTIVE SEED GUIDED MASTECTOMY WITH AXILLARY SENTINEL LYMPH NODE BIOPSY Right 03/26/2016   Procedure:  RIGHT BREAST LUMPECTOMY WITH RADIOACTIVE SEED AND SENTINEL LYMPH NODE BIOPSY;  Surgeon: Rolm Bookbinder, MD;  Location: [place];  Service: General;  Laterality: Right;  . RADIOACTIVE SEED GUIDED MASTECTOMY WITH AXILLARY SENTINEL LYMPH NODE BIOPSY Left 05/20/2016   Procedure: LEFT BREAST RADIOACTIVE SEED (two seeds) GUIDED LUMPECTOMY WITH LEFT AXILLARY SENTINEL LYMPH NODE BIOPSY AND LEFT SEED TARGETED AXILLARY LYMPH NODE EXCISION;  Surgeon: Rolm Bookbinder, MD;  Location: [place];  Service: General;  Laterality: Left;  . RE-EXCISION OF BREAST CANCER,SUPERIOR MARGINS Right 05/20/2016   Procedure: RE-EXCISION OF RIGHT BREAST MARGIN;  Surgeon: Rolm Bookbinder, MD;  Location: [place];  Service: General;  Laterality: Right;  . RE-EXCISION OF BREAST CANCER,SUPERIOR MARGINS Left 06/02/2016   Procedure: RE-EXCISION OF BREAST CANCER,SUPERIOR MARGINS;  Surgeon: Rolm Bookbinder, MD;  Location: [place];  Service: General;  Laterality: Left;    FAMILY HISTORY Family History  Problem Relation Age of Onset  . Hypertension Mother   . Heart disease Mother   . Lung disease Mother   . Hypertension Father   . Cancer Father     colon  . Heart disease Father   . Lung cancer Father   . Breast cancer Paternal Grandmother     Age 36's  . Hypertension Brother   . Hyperlipidemia Brother   . Diabetes Maternal Grandfather   The patient's father died from lung cancer at the age of 30. He also had a remote history  of colon cancer. The patient's mother died at age 87. The patient has one brother, no sisters.  GYNECOLOGIC HISTORY:  No LMP recorded. Patient is postmenopausal. Menarche age 20, the patient is GX P0. 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 She lives  alone, with no pets.    ADVANCED DIRECTIVES: In place. She has named her brother Marya Amsler as her healthcare part of attorney. He can be reached at [phone]   HEALTH MAINTENANCE: Social History  Substance Use Topics  . Smoking status: Former Smoker    Quit date: 12/20/1984  . Smokeless tobacco: Never Used  . Alcohol use 7.2 oz/week    12 Cans of beer per week     Comment: daily beer     Colonoscopy:February 2016  PAP:  Bone density: 03/04/2016/osteopenia   Allergies  Allergen Reactions  . Adhesive [Tape] Rash    Current Outpatient Prescriptions  Medication Sig Dispense Refill  . acetaminophen (TYLENOL) 500 MG tablet Take 1,000 mg by mouth every 6 (six) hours as needed.    Marland Kitchen albuterol (PROAIR HFA) 108 (90 Base) MCG/ACT inhaler Inhale 2 puffs into the lungs every 4 (four) hours as needed for wheezing or shortness of breath. 1 Inhaler 1  . ALPRAZolam (XANAX) 0.25 MG tablet Take 1 tablet (0.25 mg total) by mouth at bedtime as needed. for sleep 30 tablet 1  . beclomethasone (QVAR) 40 MCG/ACT inhaler INHALE 2 PUFFS BY MOUTH TWICE DAILY TO PREVENT COUGH OR WHEEZING. RINSE, GARGLE AND SPIT AFTER USE 8.7 g 4  . Calcium Carbonate-Vitamin D (CALCIUM + D PO) Take by mouth.    . capecitabine (XELODA) 500 MG tablet Take 2 tablets (1,000 mg total) by mouth 2 (two) times daily after a meal. 120 tablet 0  . cetirizine (ZYRTEC) 10 MG tablet Take 10 mg by mouth.    . docusate sodium (COLACE) 50 MG capsule Take 50 mg by mouth.    . fluticasone (FLONASE) 50 MCG/ACT nasal spray Place 2 sprays into both nostrils daily. 16 g 5  . methocarbamol (ROBAXIN) 500 MG tablet Take 1 tablet (500 mg total) by mouth every 8 (eight) hours as needed for muscle spasms. 30 tablet 0  . oxyCODONE (ROXICODONE) 5 MG immediate release tablet Take 1-2 tablets (5-10 mg total) by mouth every 4 (four) hours as needed for severe pain. (Patient not taking: Reported on 07/25/2016) 30 tablet 0  . PRILOSEC OTC 20 MG tablet  Reported on 12/14/2015    . Probiotic Product (PROBIOTIC DAILY PO) Take by mouth.    . tamoxifen (NOLVADEX) 20 MG tablet Take 20 mg by mouth daily.    . Turmeric 450 MG CAPS Take by mouth.    . venlafaxine XR (EFFEXOR-XR) 75 MG 24 hr capsule Take 1 capsule (75 mg total) by mouth daily with breakfast. 90 capsule 12   No current facility-administered medications for this visit.     OBJECTIVE: Middle-aged white woman In no acute distress Vitals:   08/27/16 1437  BP: (!) 114/94  Pulse: 81  Resp: 20  Temp: 97.9 F (36.6 C)     Body mass index is 25.83 kg/m.    ECOG FS:1 - Symptomatic but completely ambulatory  Sclerae unicteric, EOMs intact Oropharynx clear and moist No cervical or supraclavicular adenopathy Lungs no rales or rhonchi Heart regular rate and  rhythm Abd soft, nontender, positive bowel sounds MSK no focal spinal tenderness, no upper extremity lymphedema Neuro: nonfocal, well oriented, appropriate affect Breasts: The right breast is status post lumpectomy with no evidence of local recurrence. The left breast is status post mastectomy with expander in place. There is no evidence of local recurrence. Both axillae are benign.   LAB RESULTS:  CMP     Component Value Date/Time   NA 137 04/01/2016 1612   K 3.9 04/01/2016 1612   CO2 24 04/01/2016 1612   GLUCOSE 108 04/01/2016 1612   BUN 12.0 04/01/2016 1612   CREATININE 0.8 04/01/2016 1612   CALCIUM 9.3 04/01/2016 1612   PROT 6.9 04/01/2016 1612   ALBUMIN 3.5 04/01/2016 1612   AST 53 (H) 04/01/2016 1612   ALT 92 (H) 04/01/2016 1612   ALKPHOS 84 04/01/2016 1612   BILITOT 0.34 04/01/2016 1612    INo results found for: SPEP, UPEP  Lab Results  Component Value Date   WBC 7.6 04/01/2016   NEUTROABS 4.6 04/01/2016   HGB 14.4 07/01/2016   HCT 42.2 04/01/2016   MCV 91.3 04/01/2016   PLT 376 04/01/2016      Chemistry      Component Value Date/Time   NA 137 04/01/2016 1612   K 3.9 04/01/2016 1612   CO2 24  04/01/2016 1612   BUN 12.0 04/01/2016 1612   CREATININE 0.8 04/01/2016 1612      Component Value Date/Time   CALCIUM 9.3 04/01/2016 1612   ALKPHOS 84 04/01/2016 1612   AST 53 (H) 04/01/2016 1612   ALT 92 (H) 04/01/2016 1612   BILITOT 0.34 04/01/2016 1612       No results found for: LABCA2  No components found for: LABCA125  No results for input(s): INR in the last 168 hours.  Urinalysis    Component Value Date/Time   COLORURINE YELLOW 01/31/2016 1526   APPEARANCEUR CLEAR 01/31/2016 1526   LABSPEC 1.006 01/31/2016 1526   PHURINE 6.5 01/31/2016 1526   GLUCOSEU NEGATIVE 01/31/2016 1526   HGBUR NEGATIVE 01/31/2016 1526   BILIRUBINUR NEGATIVE 01/31/2016 1526   KETONESUR NEGATIVE 01/31/2016 1526   PROTEINUR NEGATIVE 01/31/2016 1526   UROBILINOGEN 0.2 12/11/2014 1506   NITRITE NEGATIVE 01/31/2016 1526   LEUKOCYTESUR TRACE (A) 01/31/2016 1526     STUDIES: No results found.  ELIGIBLE FOR AVAILABLE RESEARCH PROTOCOL: no  ASSESSMENT: 66 y.o. [place] woman status post right breast upper outer quadrant lumpectomy 03/26/2016 for a pT1c pN1, stage IIA invasive ductal carcinoma, grade 1, estrogen and progesterone receptor positive, HER-2 nonamplified, with an MIB-1 of 12%   (a) mammaprint from this tumor was read as "low risk".   (1) a second right breast cancer, invasive lobular, grade 1, measuring 0.6 cm, focally involved the final right medial margin from the 03/26/2016 procedure; this tumor also was estrogen and progesterone receptor positive, HER-2 negative, with an MIB-1 of 3%  (a) medial margin cleared with additional surgery 05/20/2016.  (2) two biopsies from the left breast 04/09/2016 and 04/11/2016, 4.6 cm apart, showed  (a) centrally, an invasive ductal carcinoma, grade 1 or 2, with no prognostic panel available  (b) in the upper outer quadrant, invasive ductal carcinoma, grade 2, estrogen receptor 10% positive, progesterone receptor and HER-2 negative, with an MIB-1  of 2%  (c) left axillary lymph node biopsy 04/14/2016 showed invasive ductal carcinoma, estrogen and progesterone receptor negative  (d) mammaprint from (2)(c) was also "low risk"  (3) left lumpectomy 05/20/2016 showed a  pT1c  pN1 stage IIA  invasive ductal carcinoma, grade 2, estrogen receptor positive, progesterone receptor negative, HER-2 nonamplified, with an MIB-1 of 2%; margins were positive   (4) status post left sided nipple sparing mastectomy with immediate expander placement 07/01/2016 showing multiple foci of residual grade 2 invasive ductal carcinoma, the largest measuring 0.7 cm, with evidence of lymphovascular invasion and multiple close but negative margins  (5) radiation therapy ongong  (a) capecitabine started 07/17/2016 at sensitization doses  (6) tamoxifen started 04/18/2016--to be continued and minimum of 5 years  (7) genetics consultation pending   PLAN; I spent approximately 30 minutes with Ardra with most of that time spent discussing her complex problems. She is tolerating radiation well so far, but of course she just started. She is tolerating the capecitabine without any side effects that she is aware of.  I particularly urged her to call us if she develops any mouth sores, diarrhea, or changes in her palms or soles, as that would indicate a need for interruption of capecitabine or at least a drop in the dose.  I anticipate she will have significant skin toxicity from her radiation given the sensitization. She will discuss this with her radiation oncologist as it develops.  On the plus side she is tolerating tamoxifen well and the plan will be to continue that for at least 5 years.  The bill from her Mammaprint has still not been cleared and I will ask our navigator to see if we can make any further headway on that. They have offered her a settlement of $500 on the initial [phone] bill.  We discussed the article she brought from the [employer]. I felt the  article was poorly written because it did not clarify the role of Mammaprint an Oncotype reason rose he did not receive chemotherapy is not that she didn't want or I did want to give it but because we had level I evidence that he would do her and that she has a good prognosis without.  She will return to see me in late March, but she will be in this building [place] during radiation and I have asked her to simply stop by my office if any symptoms develop relating to the capecitabine  She knows to call for any other problems that may develop before then.   :Chauncey Cruel, MD   08/27/2016 2:54 PM Medical Oncology and Hematology [facility] [place], [place] [address] Tel. [phone]    Fax. [phone]

## 2016-08-28 ENCOUNTER — Ambulatory Visit
Admission: RE | Admit: 2016-08-28 | Discharge: 2016-08-28 | Disposition: A | Discharge status: Home / Self Care | Payer: BC Managed Care – PPO | Source: Ambulatory Visit | Attending: Radiation Oncology | Admitting: Radiation Oncology

## 2016-08-28 DIAGNOSIS — Z510 Encounter for antineoplastic radiation therapy: Secondary | ICD-10-CM | POA: Diagnosis not present

## 2016-08-29 ENCOUNTER — Ambulatory Visit
Admission: RE | Admit: 2016-08-29 | Discharge: 2016-08-29 | Disposition: A | Discharge status: Home / Self Care | Payer: BC Managed Care – PPO | Source: Ambulatory Visit | Attending: Radiation Oncology | Admitting: Radiation Oncology

## 2016-08-29 DIAGNOSIS — Z510 Encounter for antineoplastic radiation therapy: Secondary | ICD-10-CM | POA: Diagnosis not present

## 2016-09-01 ENCOUNTER — Encounter: Payer: Self-pay | Admitting: Nurse Practitioner

## 2016-09-01 ENCOUNTER — Ambulatory Visit (HOSPITAL_BASED_OUTPATIENT_CLINIC_OR_DEPARTMENT_OTHER): Payer: BC Managed Care – PPO | Admitting: Nurse Practitioner

## 2016-09-01 ENCOUNTER — Ambulatory Visit
Admission: RE | Admit: 2016-09-01 | Discharge: 2016-09-01 | Disposition: A | Discharge status: Home / Self Care | Payer: BC Managed Care – PPO | Source: Ambulatory Visit | Attending: Radiation Oncology | Admitting: Radiation Oncology

## 2016-09-01 ENCOUNTER — Encounter: Payer: Self-pay | Admitting: Radiation Oncology

## 2016-09-01 ENCOUNTER — Other Ambulatory Visit: Payer: Self-pay | Admitting: *Deleted

## 2016-09-01 ENCOUNTER — Ambulatory Visit (HOSPITAL_BASED_OUTPATIENT_CLINIC_OR_DEPARTMENT_OTHER): Payer: BC Managed Care – PPO

## 2016-09-01 VITALS — BP 120/81 | HR 82 | Temp 98.9°F | Resp 18 | Ht 64.0 in | Wt 150.3 lb

## 2016-09-01 VITALS — BP 120/77 | HR 85 | Temp 98.5°F | Ht 64.0 in | Wt 149.8 lb

## 2016-09-01 DIAGNOSIS — R070 Pain in throat: Secondary | ICD-10-CM

## 2016-09-01 DIAGNOSIS — Z170 Estrogen receptor positive status [ER+]: Secondary | ICD-10-CM

## 2016-09-01 DIAGNOSIS — C50412 Malignant neoplasm of upper-outer quadrant of left female breast: Secondary | ICD-10-CM

## 2016-09-01 DIAGNOSIS — Z510 Encounter for antineoplastic radiation therapy: Secondary | ICD-10-CM | POA: Diagnosis not present

## 2016-09-01 DIAGNOSIS — E871 Hypo-osmolality and hyponatremia: Secondary | ICD-10-CM

## 2016-09-01 DIAGNOSIS — R05 Cough: Secondary | ICD-10-CM

## 2016-09-01 DIAGNOSIS — C50411 Malignant neoplasm of upper-outer quadrant of right female breast: Secondary | ICD-10-CM

## 2016-09-01 DIAGNOSIS — J0100 Acute maxillary sinusitis, unspecified: Secondary | ICD-10-CM

## 2016-09-01 DIAGNOSIS — R51 Headache: Secondary | ICD-10-CM

## 2016-09-01 DIAGNOSIS — J329 Chronic sinusitis, unspecified: Secondary | ICD-10-CM | POA: Insufficient documentation

## 2016-09-01 LAB — COMPREHENSIVE METABOLIC PANEL
ALK PHOS: 65 U/L (ref 40–150)
ALT: 44 U/L (ref 0–55)
ANION GAP: 8 meq/L (ref 3–11)
AST: 40 U/L — ABNORMAL HIGH (ref 5–34)
Albumin: 3.8 g/dL (ref 3.5–5.0)
BUN: 10.4 mg/dL (ref 7.0–26.0)
CO2: 25 meq/L (ref 22–29)
Calcium: 9.3 mg/dL (ref 8.4–10.4)
Chloride: 100 mEq/L (ref 98–109)
Creatinine: 0.8 mg/dL (ref 0.6–1.1)
EGFR: 83 mL/min/{1.73_m2} — AB (ref >90)
Glucose: 91 mg/dl (ref 70–140)
POTASSIUM: 4.6 meq/L (ref 3.5–5.1)
Sodium: 133 mEq/L — ABNORMAL LOW (ref 136–145)
TOTAL PROTEIN: 6.9 g/dL (ref 6.4–8.3)

## 2016-09-01 LAB — CBC WITH DIFFERENTIAL/PLATELET
BASO%: 0.5 % (ref 0.0–2.0)
BASOS ABS: 0 10*3/uL (ref 0.0–0.1)
EOS ABS: 0 10*3/uL (ref 0.0–0.5)
EOS%: 0 % (ref 0.0–7.0)
HCT: 43.6 % (ref 34.8–46.6)
HGB: 14.4 g/dL (ref 11.6–15.9)
LYMPH%: 12.3 % — AB (ref 14.0–49.7)
MCH: 30.4 pg (ref 25.1–34.0)
MCHC: 33 g/dL (ref 31.5–36.0)
MCV: 92 fL (ref 79.5–101.0)
MONO#: 0.8 10*3/uL (ref 0.1–0.9)
MONO%: 17.9 % — AB (ref 0.0–14.0)
NEUT%: 69.3 % (ref 38.4–76.8)
NEUTROS ABS: 3 10*3/uL (ref 1.5–6.5)
PLATELETS: 251 10*3/uL (ref 145–400)
RBC: 4.74 10*6/uL (ref 3.70–5.45)
RDW: 13.7 % (ref 11.2–14.5)
WBC: 4.3 10*3/uL (ref 3.9–10.3)
lymph#: 0.5 10*3/uL — ABNORMAL LOW (ref 0.9–3.3)

## 2016-09-01 MED ORDER — AMOXICILLIN-POT CLAVULANATE 875-125 MG PO TABS: 1.0000 | ORAL_TABLET | Freq: Two times a day (BID) | ORAL | 0 refills | Status: DC

## 2016-09-01 NOTE — Progress Notes (Signed)
Ms. Ditsworth presents for her 10th fraction of radiation to her Left Chest wall. She denies pain to her Breast. She does report sinus pain which she believes is related to a sinus infection. She has developed a virus with symptoms including chest congestion, and sinus congestion, sore throat. She reports pain over her face which are symptoms of her sinus infection. She has had a headache for several days. Her Left Chest is red. She has redness underneath her Left Breast, and reports itching to this area. There are several raised red bumps to this area as well. She is using Radiaplex twice daily.   BP 120/77   Pulse 85   Temp 98.5 F (36.9 C)   Ht 5\' 4"  (1.626 m)   Wt 149 lb 12.8 oz (67.9 kg)   SpO2 98% Comment: room air  BMI 25.71 kg/m    Wt Readings from Last 3 Encounters:  09/01/16 149 lb 12.8 oz (67.9 kg)  08/27/16 150 lb 8 oz (68.3 kg)  08/25/16 149 lb 3.2 oz (67.7 kg)

## 2016-09-01 NOTE — Assessment & Plan Note (Signed)
Patient states that she began having some URI symptoms this past Thursday, 08/28/2016.  She complained of sinus congestion, mild sore throat, a congested cough, and headache.  She states that she had some chills; but no obvious noted fever.  She says that she felt a little achy; but no full body discomfort.  Patient states that she had a flu shot earlier this year.    Today patient complains of a chronic headache with some facial tenderness that occasionally radiates to her bilateral jaws.  Exam today reveals lungs clear bilaterally; with no wheezes.  Patient has an occasional dry cough only.  No wheezing or shortness of breath noted.  Patient does have nasal congestion and some questionable tenderness to the maxillary sinuses only.  Jaws with full range of motion.  Vital signs were stable and patient was afebrile today.  Long discussion with patient regarding the possibility that she had developed a viral infection late last week; that could now have evolved into a sinusitis infection.  Patient will be prescribed Augmentin antibiotics for treatment of sinusitis.  Patient was encouraged to continue taking the Sudafed, Mucinex, and Tylenol as she previously was directed.  She was advised to call/return directly to the emergency department for any worsening symptoms whatsoever.

## 2016-09-01 NOTE — Progress Notes (Signed)
SYMPTOM MANAGEMENT CLINIC    Chief Complaint: Sinusitis  HPI:  Sabrina Tapia 66 y.o. female diagnosed with breast cancer.  Currently undergoing tamoxifen and Xeloda oral therapy.  Also receives radiation treatment.    No history exists.    Review of Systems  Constitutional: Positive for chills and malaise/fatigue.  HENT: Positive for congestion.   Respiratory: Positive for cough.   Neurological: Positive for headaches.  All other systems reviewed and are negative.   Past Medical History:  Diagnosis Date  . Arthritis   . Asthma    allergy induced  . Cancer ([place]) 2017   bil breast cancer  . Cervical polyp   . Detrusor instability   . GERD (gastroesophageal reflux disease)   . Heart burn   . Hemorrhoids   . Hypertonicity of bladder   . Joint pain   . LGSIL (low grade squamous intraepithelial dysplasia) 2001   colpo biopsy negative, negative paps since  . Osteopenia 02/2016   T score -1.3 FRAX 8.3%/0.7% stable from prior DEXA    Past Surgical History:  Procedure Laterality Date  . BREAST RECONSTRUCTION WITH PLACEMENT OF TISSUE EXPANDER AND FLEX HD (ACELLULAR HYDRATED DERMIS) Left 07/01/2016   Procedure: BREAST RECONSTRUCTION WITH PLACEMENT OF TISSUE EXPANDER AND  ALLODERM;  Surgeon: Irene Limbo, MD;  Location: [place];  Service: Plastics;  Laterality: Left;  . INCISION AND DRAINAGE Left 06/02/2016   Procedure: DRAINAGE of left axilla seroma;  Surgeon: Rolm Bookbinder, MD;  Location: [place];  Service: General;  Laterality: Left;  . NIPPLE SPARING MASTECTOMY Left 07/01/2016   Procedure: LEFT NIPPLE SPARING MASTECTOMY;  Surgeon: Rolm Bookbinder, MD;  Location: [place];  Service: General;  Laterality: Left;  . RADIOACTIVE SEED GUIDED MASTECTOMY WITH AXILLARY SENTINEL LYMPH NODE BIOPSY Right 03/26/2016   Procedure: RIGHT BREAST LUMPECTOMY WITH RADIOACTIVE SEED AND SENTINEL LYMPH NODE BIOPSY;  Surgeon: Rolm Bookbinder, MD;  Location: [place];  Service: General;  Laterality: Right;  . RADIOACTIVE SEED GUIDED MASTECTOMY WITH AXILLARY SENTINEL LYMPH NODE BIOPSY Left 05/20/2016   Procedure: LEFT BREAST RADIOACTIVE SEED (two seeds) GUIDED LUMPECTOMY WITH LEFT AXILLARY SENTINEL LYMPH NODE BIOPSY AND LEFT SEED TARGETED AXILLARY LYMPH NODE EXCISION;  Surgeon: Rolm Bookbinder, MD;  Location: [place];  Service: General;  Laterality: Left;  . RE-EXCISION OF BREAST CANCER,SUPERIOR MARGINS Right 05/20/2016   Procedure: RE-EXCISION OF RIGHT BREAST MARGIN;  Surgeon: Rolm Bookbinder, MD;  Location: [place];  Service: General;  Laterality: Right;  . RE-EXCISION OF BREAST CANCER,SUPERIOR MARGINS Left 06/02/2016   Procedure: RE-EXCISION OF BREAST CANCER,SUPERIOR MARGINS;  Surgeon: Rolm Bookbinder, MD;  Location: [place];  Service: General;  Laterality: Left;    has Joint pain; Abnormal Pap smear; Osteopenia; Heart burn; Plantar fasciitis, right; Retrocalcaneal bursitis; Pain in joint, ankle and foot; Bunion of great toe of right foot; Acid reflux; Hemorrhoid; Anal skin tag; Anal burning; Breast cancer of upper-outer quadrant of right female breast ([place]); Breast cancer of upper-outer quadrant of left female breast ([place]); Sinusitis; and Hyponatremia on her problem list.    is allergic to adhesive [tape].  Allergies as of 09/01/2016      Reactions   Adhesive [tape] Rash      Medication List       Accurate as of 09/01/16 11:57 AM. Always use your most recent med list.          acetaminophen 500 MG tablet Commonly known  as:  TYLENOL Take 1,000 mg by mouth every 6 (six) hours as needed.   albuterol 108 (90 Base) MCG/ACT inhaler Commonly known as:  PROAIR HFA Inhale 2 puffs into the lungs every 4 (four) hours as needed for wheezing or shortness of breath.   ALPRAZolam 0.25 MG tablet Commonly known as:  XANAX Take 1 tablet (0.25 mg total)  by mouth at bedtime as needed. for sleep   amoxicillin-clavulanate 875-125 MG tablet Commonly known as:  AUGMENTIN Take 1 tablet by mouth 2 (two) times daily.   beclomethasone 40 MCG/ACT inhaler Commonly known as:  QVAR INHALE 2 PUFFS BY MOUTH TWICE DAILY TO PREVENT COUGH OR WHEEZING. RINSE, GARGLE AND SPIT AFTER USE   CALCIUM + D PO Take by mouth.   capecitabine 500 MG tablet Commonly known as:  XELODA Take 2 tablets (1,000 mg total) by mouth 2 (two) times daily after a meal.   cetirizine 10 MG tablet Commonly known as:  ZYRTEC Take 10 mg by mouth.   docusate sodium 50 MG capsule Commonly known as:  COLACE Take 50 mg by mouth.   fluticasone 50 MCG/ACT nasal spray Commonly known as:  FLONASE Place 2 sprays into both nostrils daily.   gabapentin 300 MG capsule Commonly known as:  NEURONTIN Take 300 mg by mouth at bedtime as needed and may repeat dose one time if needed.   PRILOSEC OTC 20 MG tablet Generic drug:  omeprazole Reported on 12/14/2015   tamoxifen 20 MG tablet Commonly known as:  NOLVADEX Take 20 mg by mouth daily.   Turmeric 450 MG Caps Take by mouth.   venlafaxine XR 75 MG 24 hr capsule Commonly known as:  EFFEXOR-XR Take 1 capsule (75 mg total) by mouth daily with breakfast.   Vitamin D 2000 units tablet Take 1 tablet (2,000 Units total) by mouth daily.        PHYSICAL EXAMINATION  Oncology Vitals 09/01/2016 09/01/2016  Height 163 cm 163 cm  Weight 68.176 kg 67.949 kg  Weight (lbs) 150 lbs 5 oz 149 lbs 13 oz  BMI (kg/m2) 25.8 kg/m2 25.71 kg/m2  Temp 98.9 98.5  Pulse 82 85  Resp 18 -  SpO2 100 98  BSA (m2) 1.75 m2 1.75 m2   BP Readings from Last 2 Encounters:  09/01/16 120/81  09/01/16 120/77    Physical Exam  Constitutional: She is oriented to person, place, and time. She appears unhealthy.  HENT:  Head: Normocephalic and atraumatic.  Mild erythema to posterior oropharynx; but no exudate.  Patient has nasal congestion and some trace  tenderness to the bilateral maxillary sinuses with palpation.  Eyes: Conjunctivae and EOM are normal. Pupils are equal, round, and reactive to light. Right eye exhibits no discharge. Left eye exhibits no discharge. No scleral icterus.  Neck: Normal range of motion. Neck supple. No JVD present. No tracheal deviation present. No thyromegaly present.  Cardiovascular: Normal rate, regular rhythm, normal heart sounds and intact distal pulses.   Pulmonary/Chest: Effort normal and breath sounds normal. No respiratory distress. She has no wheezes. She has no rales. She exhibits no tenderness.  Occasional dry cough only.  Abdominal: Soft. Bowel sounds are normal. She exhibits no distension and no mass. There is no tenderness. There is no rebound and no guarding.  Musculoskeletal: Normal range of motion. She exhibits no edema or tenderness.  Lymphadenopathy:    She has no cervical adenopathy.  Neurological: She is alert and oriented to person, place, and time. Gait normal.  Skin:  Skin is warm and dry. No rash noted. No erythema. No pallor.  Psychiatric: Affect normal.  Nursing note and vitals reviewed.   LABORATORY DATA:. Appointment on 09/01/2016  Component Date Value Ref Range Status  . WBC 09/01/2016 4.3  3.9 - 10.3 10e3/uL Final  . NEUT# 09/01/2016 3.0  1.5 - 6.5 10e3/uL Final  . HGB 09/01/2016 14.4  11.6 - 15.9 g/dL Final  . HCT 09/01/2016 43.6  34.8 - 46.6 % Final  . Platelets 09/01/2016 251  145 - 400 10e3/uL Final  . MCV 09/01/2016 92.0  79.5 - 101.0 fL Final  . MCH 09/01/2016 30.4  25.1 - 34.0 pg Final  . MCHC 09/01/2016 33.0  31.5 - 36.0 g/dL Final  . RBC 09/01/2016 4.74  3.70 - 5.45 10e6/uL Final  . RDW 09/01/2016 13.7  11.2 - 14.5 % Final  . lymph# 09/01/2016 0.5* 0.9 - 3.3 10e3/uL Final  . MONO# 09/01/2016 0.8  0.1 - 0.9 10e3/uL Final  . Eosinophils Absolute 09/01/2016 0.0  0.0 - 0.5 10e3/uL Final  . Basophils Absolute 09/01/2016 0.0  0.0 - 0.1 10e3/uL Final  . NEUT% 09/01/2016  69.3  38.4 - 76.8 % Final  . LYMPH% 09/01/2016 12.3* 14.0 - 49.7 % Final  . MONO% 09/01/2016 17.9* 0.0 - 14.0 % Final  . EOS% 09/01/2016 0.0  0.0 - 7.0 % Final  . BASO% 09/01/2016 0.5  0.0 - 2.0 % Final  . Sodium 09/01/2016 133* 136 - 145 mEq/L Final  . Potassium 09/01/2016 4.6  3.5 - 5.1 mEq/L Final  . Chloride 09/01/2016 100  98 - 109 mEq/L Final  . CO2 09/01/2016 25  22 - 29 mEq/L Final  . Glucose 09/01/2016 91  70 - 140 mg/dl Final  . BUN 09/01/2016 10.4  7.0 - 26.0 mg/dL Final  . Creatinine 09/01/2016 0.8  0.6 - 1.1 mg/dL Final  . Total Bilirubin 09/01/2016 <0.22  0.20 - 1.20 mg/dL Final  . Alkaline Phosphatase 09/01/2016 65  40 - 150 U/L Final  . AST 09/01/2016 40* 5 - 34 U/L Final  . ALT 09/01/2016 44  0 - 55 U/L Final  . Total Protein 09/01/2016 6.9  6.4 - 8.3 g/dL Final  . Albumin 09/01/2016 3.8  3.5 - 5.0 g/dL Final  . Calcium 09/01/2016 9.3  8.4 - 10.4 mg/dL Final  . Anion Gap 09/01/2016 8  3 - 11 mEq/L Final  . EGFR 09/01/2016 83* >90 ml/min/1.73 m2 Final    RADIOGRAPHIC STUDIES: No results found.  ASSESSMENT/PLAN:    Sinusitis Patient states that she began having some URI symptoms this past Thursday, 08/28/2016.  She complained of sinus congestion, mild sore throat, a congested cough, and headache.  She states that she had some chills; but no obvious noted fever.  She says that she felt a little achy; but no full body discomfort.  Patient states that she had a flu shot earlier this year.    Today patient complains of a chronic headache with some facial tenderness that occasionally radiates to her bilateral jaws.  Exam today reveals lungs clear bilaterally; with no wheezes.  Patient has an occasional dry cough only.  No wheezing or shortness of breath noted.  Patient does have nasal congestion and some questionable tenderness to the maxillary sinuses only.  Jaws with full range of motion.  Vital signs were stable and patient was afebrile today.  Long discussion with  patient regarding the possibility that she had developed a viral infection late last week; that  could now have evolved into a sinusitis infection.  Patient will be prescribed Augmentin antibiotics for treatment of sinusitis.  Patient was encouraged to continue taking the Sudafed, Mucinex, and Tylenol as she previously was directed.  She was advised to call/return directly to the emergency department for any worsening symptoms whatsoever.  Hyponatremia Sodium was down to 133 today.  Patient states she's been both drinking and eating with no difficulties.  Patient was encouraged to push fluids and increase her oral intake is much as possible.  We'll continue to monitor.  Breast cancer of upper-outer quadrant of right female breast [facility]) Patient continues to take tamoxifen oral therapy; as well as Xeloda oral therapy as directed.  She also continues with her breast radiation treatments as well.  She received #10 out of 30 of her radiation treatments today.  She is scheduled to return on 10/27/2016 for her next labs in follow-up visit here at the cancer center.   Patient stated understanding of all instructions; and was in agreement with this plan of care. The patient knows to call the clinic with any problems, questions or concerns.   Total time spent with patient was 25 minutes;  with greater than 75 percent of that time spent in face to face counseling regarding patient's symptoms,  and coordination of care and follow up.  Disclaimer:This dictation was prepared with Dragon/digital dictation along with [employer]. Any transcriptional errors that result from this process are unintentional.  Drue Second, NP 09/01/2016

## 2016-09-01 NOTE — Assessment & Plan Note (Signed)
Patient continues to take tamoxifen oral therapy; as well as Xeloda oral therapy as directed.  She also continues with her breast radiation treatments as well.  She received #10 out of 30 of her radiation treatments today.  She is scheduled to return on 10/27/2016 for her next labs in follow-up visit here at the cancer center.

## 2016-09-01 NOTE — Assessment & Plan Note (Signed)
Sodium was down to 133 today.  Patient states she's been both drinking and eating with no difficulties.  Patient was encouraged to push fluids and increase her oral intake is much as possible.  We'll continue to monitor.

## 2016-09-01 NOTE — Progress Notes (Signed)
Main c/o today is sinus headache/sinus pressure and cough. Denies fever. 1 episode of chills over the weekend.  Non-productive cough.

## 2016-09-01 NOTE — Progress Notes (Signed)
   Weekly Management Note:  Outpatient    ICD-9-CM ICD-10-CM   1. Malignant neoplasm of upper-outer quadrant of left breast in female, estrogen receptor positive (HCC) 174.4 C50.412    V86.0 Z17.0   2. Malignant neoplasm of upper-outer quadrant of right breast in female, estrogen receptor positive ([place]) 174.4 C50.411    V86.0 Z17.0     Current Dose:  18 Gy to left chest wall and nodes / 20Gy to right breast/axilla Projected Dose: 50.4 Gy / 60Gy  Narrative:  The patient presents for routine under treatment assessment.  CBCT/MVCT images/Port film x-rays were reviewed.  The chart was checked. URI symptoms, wonders if she has a sinus infection.  Physical Findings:  height is 5\' 4"  (1.626 m) and weight is 149 lb 12.8 oz (67.9 kg). Her temperature is 98.5 F (36.9 C). Her blood pressure is 120/77 and her pulse is 85. Her oxygen saturation is 98%.   Wt Readings from Last 3 Encounters:  09/01/16 150 lb 4.8 oz (68.2 kg)  09/01/16 149 lb 12.8 oz (67.9 kg)  08/27/16 150 lb 8 oz (68.3 kg)   Mild erythema so far over b/l chest  Impression:  The patient is tolerating radiotherapy.  Plan:  Continue radiotherapy as planned. Patient instructed to apply Radiplex to intact skin in treatment fields. She will see Selena Lesser today for sinus symptoms.   ________________________________   Eppie Gibson, M.D.

## 2016-09-02 ENCOUNTER — Ambulatory Visit
Admission: RE | Admit: 2016-09-02 | Discharge: 2016-09-02 | Disposition: A | Discharge status: Home / Self Care | Payer: BC Managed Care – PPO | Source: Ambulatory Visit | Attending: Radiation Oncology | Admitting: Radiation Oncology

## 2016-09-02 DIAGNOSIS — Z510 Encounter for antineoplastic radiation therapy: Secondary | ICD-10-CM | POA: Diagnosis not present

## 2016-09-03 ENCOUNTER — Other Ambulatory Visit: Payer: Self-pay | Admitting: Nurse Practitioner

## 2016-09-03 ENCOUNTER — Telehealth: Payer: Self-pay | Admitting: *Deleted

## 2016-09-03 ENCOUNTER — Ambulatory Visit
Admission: RE | Admit: 2016-09-03 | Discharge: 2016-09-03 | Disposition: A | Discharge status: Home / Self Care | Payer: BC Managed Care – PPO | Source: Ambulatory Visit | Attending: Radiation Oncology | Admitting: Radiation Oncology

## 2016-09-03 DIAGNOSIS — Z510 Encounter for antineoplastic radiation therapy: Secondary | ICD-10-CM | POA: Diagnosis not present

## 2016-09-03 DIAGNOSIS — J0100 Acute maxillary sinusitis, unspecified: Secondary | ICD-10-CM

## 2016-09-03 MED ORDER — CEFUROXIME AXETIL 250 MG PO TABS: 250.0000 mg | ORAL_TABLET | Freq: Two times a day (BID) | ORAL | 0 refills | Status: DC

## 2016-09-03 NOTE — Telephone Encounter (Signed)
Selena Lesser NP ordered Cefuroxime instructed this nurse to call patient.  Called reaching voicemail.  Message left with this order sent to [facility] on [address]. has been ordered.

## 2016-09-03 NOTE — Telephone Encounter (Signed)
"  I've been taking a high dose penicillin since Monday for sinusitis.  I stopped this yesterday after starting to itch to my hands and belly along with an upset stomach.  I need something different ordered that's not as strong sent to [facility] at [address]  The last time I had this happen I used Cefuroxime 500 mg with no problems."

## 2016-09-04 ENCOUNTER — Ambulatory Visit
Admission: RE | Admit: 2016-09-04 | Discharge: 2016-09-04 | Disposition: A | Discharge status: Home / Self Care | Payer: BC Managed Care – PPO | Source: Ambulatory Visit | Attending: Radiation Oncology | Admitting: Radiation Oncology

## 2016-09-04 DIAGNOSIS — Z510 Encounter for antineoplastic radiation therapy: Secondary | ICD-10-CM | POA: Diagnosis not present

## 2016-09-05 ENCOUNTER — Ambulatory Visit
Admission: RE | Admit: 2016-09-05 | Discharge: 2016-09-05 | Disposition: A | Discharge status: Home / Self Care | Payer: BC Managed Care – PPO | Source: Ambulatory Visit | Attending: Radiation Oncology | Admitting: Radiation Oncology

## 2016-09-05 DIAGNOSIS — C50412 Malignant neoplasm of upper-outer quadrant of left female breast: Secondary | ICD-10-CM

## 2016-09-05 DIAGNOSIS — Z510 Encounter for antineoplastic radiation therapy: Secondary | ICD-10-CM | POA: Diagnosis not present

## 2016-09-05 MED ORDER — RADIAPLEXRX EX GEL
Freq: Once | CUTANEOUS | Status: AC
  Administered 2016-09-05: 10:00:00 via TOPICAL

## 2016-09-08 ENCOUNTER — Ambulatory Visit
Admission: RE | Admit: 2016-09-08 | Discharge: 2016-09-08 | Disposition: A | Discharge status: Home / Self Care | Payer: BC Managed Care – PPO | Source: Ambulatory Visit | Attending: Radiation Oncology | Admitting: Radiation Oncology

## 2016-09-08 ENCOUNTER — Encounter: Payer: Self-pay | Admitting: Radiation Oncology

## 2016-09-08 VITALS — BP 116/81 | HR 78 | Temp 98.4°F | Wt 147.8 lb

## 2016-09-08 DIAGNOSIS — C50411 Malignant neoplasm of upper-outer quadrant of right female breast: Secondary | ICD-10-CM

## 2016-09-08 DIAGNOSIS — C50412 Malignant neoplasm of upper-outer quadrant of left female breast: Secondary | ICD-10-CM

## 2016-09-08 DIAGNOSIS — Z170 Estrogen receptor positive status [ER+]: Secondary | ICD-10-CM

## 2016-09-08 DIAGNOSIS — Z510 Encounter for antineoplastic radiation therapy: Secondary | ICD-10-CM | POA: Diagnosis not present

## 2016-09-08 NOTE — Progress Notes (Signed)
Sabrina Tapia presents for her 15th fraction of radiation to her Left Chest wall and Right Breast. She denies pain. She reports some mild fatigue, especially in the afternoon. Her Right and Left Chest are slightly red. She reports itching to the upper half of her Bilateral Chest. She is using hydrocortisone cream to this area, and radiaplex to her entire Bilateral Chest area.   BP 116/81   Pulse 78   Temp 98.4 F (36.9 C)   Wt 147 lb 12.8 oz (67 kg)   SpO2 99% Comment: room air  BMI 25.37 kg/m    Wt Readings from Last 3 Encounters:  09/08/16 147 lb 12.8 oz (67 kg)  09/01/16 149 lb 12.8 oz (67.9 kg)  09/01/16 150 lb 4.8 oz (68.2 kg)

## 2016-09-08 NOTE — Progress Notes (Signed)
   Weekly Management Note:  Outpatient    ICD-9-CM ICD-10-CM   1. Malignant neoplasm of upper-outer quadrant of left breast in female, estrogen receptor positive (HCC) 174.4 C50.412    V86.0 Z17.0   2. Malignant neoplasm of upper-outer quadrant of right breast in female, estrogen receptor positive ([place]) 174.4 C50.411    V86.0 Z17.0     Current Dose:  27 Gy to left chest wall and nodes / 30Gy to right breast/axilla Projected Dose: 50.4 Gy / 60Gy  Narrative:  The patient presents for routine under treatment assessment.  CBCT/MVCT images/Port film x-rays were reviewed.  The chart was checked. Doing well, taking Xeloda.  Physical Findings:  weight is 147 lb 12.8 oz (67 kg). Her temperature is 98.4 F (36.9 C). Her blood pressure is 116/81 and her pulse is 78. Her oxygen saturation is 99%.   Wt Readings from Last 3 Encounters:  09/08/16 147 lb 12.8 oz (67 kg)  09/01/16 149 lb 12.8 oz (67.9 kg)  09/01/16 150 lb 4.8 oz (68.2 kg)   Mild erythema so far over b/l chest - skin intact  CBC    Component Value Date/Time   WBC 4.3 09/01/2016 1031   WBC 8.7 10/21/2011 0852   RBC 4.74 09/01/2016 1031   RBC 4.77 10/21/2011 0852   HGB 14.4 09/01/2016 1031   HCT 43.6 09/01/2016 1031   PLT 251 09/01/2016 1031   MCV 92.0 09/01/2016 1031   MCH 30.4 09/01/2016 1031   MCH 29.8 10/21/2011 0852   MCHC 33.0 09/01/2016 1031   MCHC 31.8 10/21/2011 0852   RDW 13.7 09/01/2016 1031   LYMPHSABS 0.5 (L) 09/01/2016 1031   MONOABS 0.8 09/01/2016 1031   EOSABS 0.0 09/01/2016 1031   BASOSABS 0.0 09/01/2016 1031   CMP     Component Value Date/Time   NA 133 (L) 09/01/2016 1031   K 4.6 09/01/2016 1031   CO2 25 09/01/2016 1031   GLUCOSE 91 09/01/2016 1031   BUN 10.4 09/01/2016 1031   CREATININE 0.8 09/01/2016 1031   CALCIUM 9.3 09/01/2016 1031   PROT 6.9 09/01/2016 1031   ALBUMIN 3.8 09/01/2016 1031   AST 40 (H) 09/01/2016 1031   ALT 44 09/01/2016 1031   ALKPHOS 65 09/01/2016 1031   BILITOT <0.22  09/01/2016 1031     Impression:  The patient is tolerating radiotherapy.  Plan:  Continue radiotherapy as planned. Patient instructed to apply Radiplex to intact skin in treatment fields and hydrocortisone 1% cream prn itching.    ________________________________   Eppie Gibson, M.D.

## 2016-09-09 ENCOUNTER — Ambulatory Visit
Admission: RE | Admit: 2016-09-09 | Discharge: 2016-09-09 | Disposition: A | Discharge status: Home / Self Care | Payer: BC Managed Care – PPO | Source: Ambulatory Visit | Attending: Radiation Oncology | Admitting: Radiation Oncology

## 2016-09-09 ENCOUNTER — Encounter: Payer: Self-pay | Admitting: Radiation Oncology

## 2016-09-09 DIAGNOSIS — C50411 Malignant neoplasm of upper-outer quadrant of right female breast: Secondary | ICD-10-CM | POA: Insufficient documentation

## 2016-09-09 DIAGNOSIS — Z510 Encounter for antineoplastic radiation therapy: Secondary | ICD-10-CM | POA: Insufficient documentation

## 2016-09-09 DIAGNOSIS — C50412 Malignant neoplasm of upper-outer quadrant of left female breast: Secondary | ICD-10-CM | POA: Insufficient documentation

## 2016-09-09 DIAGNOSIS — Z170 Estrogen receptor positive status [ER+]: Secondary | ICD-10-CM | POA: Insufficient documentation

## 2016-09-10 ENCOUNTER — Ambulatory Visit
Admission: RE | Admit: 2016-09-10 | Discharge: 2016-09-10 | Disposition: A | Discharge status: Home / Self Care | Payer: BC Managed Care – PPO | Source: Ambulatory Visit | Attending: Radiation Oncology | Admitting: Radiation Oncology

## 2016-09-10 DIAGNOSIS — Z510 Encounter for antineoplastic radiation therapy: Secondary | ICD-10-CM | POA: Diagnosis not present

## 2016-09-11 ENCOUNTER — Ambulatory Visit
Admission: RE | Admit: 2016-09-11 | Discharge: 2016-09-11 | Disposition: A | Discharge status: Home / Self Care | Payer: BC Managed Care – PPO | Source: Ambulatory Visit | Attending: Radiation Oncology | Admitting: Radiation Oncology

## 2016-09-11 DIAGNOSIS — Z510 Encounter for antineoplastic radiation therapy: Secondary | ICD-10-CM | POA: Diagnosis not present

## 2016-09-12 ENCOUNTER — Ambulatory Visit
Admission: RE | Admit: 2016-09-12 | Discharge: 2016-09-12 | Disposition: A | Discharge status: Home / Self Care | Payer: BC Managed Care – PPO | Source: Ambulatory Visit | Attending: Radiation Oncology | Admitting: Radiation Oncology

## 2016-09-12 DIAGNOSIS — Z510 Encounter for antineoplastic radiation therapy: Secondary | ICD-10-CM | POA: Diagnosis not present

## 2016-09-15 ENCOUNTER — Encounter: Payer: Self-pay | Admitting: Radiation Oncology

## 2016-09-15 ENCOUNTER — Ambulatory Visit
Admission: RE | Admit: 2016-09-15 | Discharge: 2016-09-15 | Disposition: A | Discharge status: Home / Self Care | Payer: BC Managed Care – PPO | Source: Ambulatory Visit | Attending: Radiation Oncology | Admitting: Radiation Oncology

## 2016-09-15 VITALS — BP 131/80 | HR 76 | Temp 98.0°F | Ht 64.0 in | Wt 150.0 lb

## 2016-09-15 DIAGNOSIS — Z170 Estrogen receptor positive status [ER+]: Secondary | ICD-10-CM

## 2016-09-15 DIAGNOSIS — C50411 Malignant neoplasm of upper-outer quadrant of right female breast: Secondary | ICD-10-CM

## 2016-09-15 DIAGNOSIS — C50412 Malignant neoplasm of upper-outer quadrant of left female breast: Secondary | ICD-10-CM

## 2016-09-15 DIAGNOSIS — Z510 Encounter for antineoplastic radiation therapy: Secondary | ICD-10-CM | POA: Diagnosis not present

## 2016-09-15 NOTE — Progress Notes (Signed)
Ms. Mulnix presents for her 20th fraction of radiation to her Left Chest Wall and Right Breast. She denies pain. She does report soreness to her Bilateral Axilla's. She had some pain under her Right Breast over the weekend. She reports some fatigue over the weekend especially on Saturday. She has some redness to her RIght Axilla. She also has redness over her Left Chest. She is using Radiaplex twice daily to her Bilateral Chest.   BP 131/80   Pulse 76   Temp 98 F (36.7 C)   Ht 5\' 4"  (1.626 m)   Wt 150 lb (68 kg)   SpO2 100% Comment: room air  BMI 25.75 kg/m    Wt Readings from Last 3 Encounters:  09/15/16 150 lb (68 kg)  09/08/16 147 lb 12.8 oz (67 kg)  09/01/16 149 lb 12.8 oz (67.9 kg)

## 2016-09-15 NOTE — Progress Notes (Signed)
  Radiation Oncology         [phone] ________________________________  Name: Sabrina Tapia MRN: [id number]  Date: 09/15/2016  DOB: 07-Aug-1950  Weekly Radiation Therapy Management    ICD-9-CM ICD-10-CM   1. Malignant neoplasm of upper-outer quadrant of left breast in female, estrogen receptor positive ([place]) 174.4 C50.412    V86.0 Z17.0   2. Malignant neoplasm of upper-outer quadrant of right breast in female, estrogen receptor positive ([place]) 174.4 C50.411    V86.0 Z17.0      Current Dose: 36 Gy left chest wall / 40 Gy right breast     Planned Dose:  50.4 Gy left chest wall / 50.4 Gy right breast  Narrative . . . . . . . . The patient presents for routine under treatment assessment.                                    Sabrina Tapia presents for her 20th fraction of radiation to her Left Chest Wall and Right Breast. She denies pain at this time, but reports soreness to her bilateral axillae. She had some pain under her Right Breast over the weekend. She reports some fatigue over the weekend, especially on Saturday. She has some redness to her RIght Axilla and Left Chest. She is using Radiaplex twice daily to her Bilateral Chest.                                  Set-up films were reviewed.                                 The chart was checked. Physical Findings. . .  height is 5\' 4"  (1.626 m) and weight is 150 lb (68 kg). Her temperature is 98 F (36.7 C). Her blood pressure is 131/80 and her pulse is 76. Her oxygen saturation is 100%.   Lungs are clear to auscultation bilaterally. Heart has regular rate and rhythm. Erythema throughout both treatment areas, no skin breakdown or moist desqamation Impression . . . . . . . The patient is tolerating radiation. Plan . . . . . . . . . . . . Continue treatment as planned. ________________________________   Blair Promise, PhD, MD  This document serves as a record of services personally performed by Gery Pray, MD. It was created on his  behalf by Darcus Austin, a trained medical scribe. The creation of this record is based on the scribe's personal observations and the provider's statements to them. This document has been checked and approved by the attending provider.

## 2016-09-16 ENCOUNTER — Encounter: Payer: Self-pay | Admitting: Radiation Oncology

## 2016-09-16 ENCOUNTER — Ambulatory Visit
Admission: RE | Admit: 2016-09-16 | Discharge: 2016-09-16 | Disposition: A | Discharge status: Home / Self Care | Payer: BC Managed Care – PPO | Source: Ambulatory Visit | Attending: Radiation Oncology | Admitting: Radiation Oncology

## 2016-09-16 DIAGNOSIS — Z510 Encounter for antineoplastic radiation therapy: Secondary | ICD-10-CM | POA: Diagnosis not present

## 2016-09-17 ENCOUNTER — Ambulatory Visit
Admission: RE | Admit: 2016-09-17 | Discharge: 2016-09-17 | Disposition: A | Discharge status: Home / Self Care | Payer: BC Managed Care – PPO | Source: Ambulatory Visit | Attending: Radiation Oncology | Admitting: Radiation Oncology

## 2016-09-17 DIAGNOSIS — Z510 Encounter for antineoplastic radiation therapy: Secondary | ICD-10-CM | POA: Diagnosis not present

## 2016-09-18 ENCOUNTER — Ambulatory Visit
Admission: RE | Admit: 2016-09-18 | Discharge: 2016-09-18 | Disposition: A | Discharge status: Home / Self Care | Payer: BC Managed Care – PPO | Source: Ambulatory Visit | Attending: Radiation Oncology | Admitting: Radiation Oncology

## 2016-09-18 DIAGNOSIS — Z510 Encounter for antineoplastic radiation therapy: Secondary | ICD-10-CM | POA: Diagnosis not present

## 2016-09-19 ENCOUNTER — Ambulatory Visit
Admission: RE | Admit: 2016-09-19 | Discharge: 2016-09-19 | Disposition: A | Discharge status: Home / Self Care | Payer: BC Managed Care – PPO | Source: Ambulatory Visit | Attending: Radiation Oncology | Admitting: Radiation Oncology

## 2016-09-19 DIAGNOSIS — Z510 Encounter for antineoplastic radiation therapy: Secondary | ICD-10-CM | POA: Diagnosis not present

## 2016-09-22 ENCOUNTER — Ambulatory Visit
Admission: RE | Admit: 2016-09-22 | Discharge: 2016-09-22 | Disposition: A | Discharge status: Home / Self Care | Payer: BC Managed Care – PPO | Source: Ambulatory Visit | Attending: Radiation Oncology | Admitting: Radiation Oncology

## 2016-09-22 ENCOUNTER — Encounter: Payer: Self-pay | Admitting: Radiation Oncology

## 2016-09-22 ENCOUNTER — Ambulatory Visit: Payer: BC Managed Care – PPO | Admitting: Radiation Oncology

## 2016-09-22 VITALS — BP 117/76 | HR 71 | Temp 98.0°F | Wt 148.4 lb

## 2016-09-22 DIAGNOSIS — C50412 Malignant neoplasm of upper-outer quadrant of left female breast: Secondary | ICD-10-CM

## 2016-09-22 DIAGNOSIS — C50411 Malignant neoplasm of upper-outer quadrant of right female breast: Secondary | ICD-10-CM

## 2016-09-22 DIAGNOSIS — Z170 Estrogen receptor positive status [ER+]: Secondary | ICD-10-CM

## 2016-09-22 DIAGNOSIS — Z510 Encounter for antineoplastic radiation therapy: Secondary | ICD-10-CM | POA: Diagnosis not present

## 2016-09-22 NOTE — Progress Notes (Signed)
Sabrina Tapia presents for her 25th fraction of radiation to her Left Chest Wall and Right Breast. She reports soreness to her Bilateral Breasts. She has some fatigue and rested over the weekend. Her Right and Left Chest is red. She also has some mild redness to her Left clavicle area, and upper Left Shoulder. She reports itching to the center area of her Chest. She is using Radiaplex twice daily, and was given a second tube today. She believes she is completing March 5th after having a Boost.   BP 117/76   Pulse 71   Temp 98 F (36.7 C)   Wt 148 lb 6.4 oz (67.3 kg)   SpO2 99% Comment: room air  BMI 25.47 kg/m    Wt Readings from Last 3 Encounters:  09/22/16 148 lb 6.4 oz (67.3 kg)  09/15/16 150 lb (68 kg)  09/08/16 147 lb 12.8 oz (67 kg)

## 2016-09-22 NOTE — Progress Notes (Signed)
   Weekly Management Note:  Outpatient    ICD-9-CM ICD-10-CM   1. Malignant neoplasm of upper-outer quadrant of right breast in female, estrogen receptor positive (HCC) 174.4 C50.411    V86.0 Z17.0   2. Malignant neoplasm of upper-outer quadrant of left breast in female, estrogen receptor positive (HCC) 174.4 C50.412    V86.0 Z17.0     Current Dose:  45 Gy to left chest wall and nodes / 50 Gy to right breast/axilla Projected Dose: 50.4 Gy / 60Gy  Narrative:  The patient presents for routine under treatment assessment.  CBCT/MVCT images/Port film x-rays were reviewed.  The chart was checked. Doing well, taking Xeloda. Skin more irritated.  Physical Findings:  weight is 148 lb 6.4 oz (67.3 kg). Her temperature is 98 F (36.7 C). Her blood pressure is 117/76 and her pulse is 71. Her oxygen saturation is 99%.   Wt Readings from Last 3 Encounters:  09/22/16 148 lb 6.4 oz (67.3 kg)  09/15/16 150 lb (68 kg)  09/08/16 147 lb 12.8 oz (67 kg)   Moderate erythema so far over b/l chest , right more than left - skin intact  CBC    Component Value Date/Time   WBC 4.3 09/01/2016 1031   WBC 8.7 10/21/2011 0852   RBC 4.74 09/01/2016 1031   RBC 4.77 10/21/2011 0852   HGB 14.4 09/01/2016 1031   HCT 43.6 09/01/2016 1031   PLT 251 09/01/2016 1031   MCV 92.0 09/01/2016 1031   MCH 30.4 09/01/2016 1031   MCH 29.8 10/21/2011 0852   MCHC 33.0 09/01/2016 1031   MCHC 31.8 10/21/2011 0852   RDW 13.7 09/01/2016 1031   LYMPHSABS 0.5 (L) 09/01/2016 1031   MONOABS 0.8 09/01/2016 1031   EOSABS 0.0 09/01/2016 1031   BASOSABS 0.0 09/01/2016 1031   CMP     Component Value Date/Time   NA 133 (L) 09/01/2016 1031   K 4.6 09/01/2016 1031   CO2 25 09/01/2016 1031   GLUCOSE 91 09/01/2016 1031   BUN 10.4 09/01/2016 1031   CREATININE 0.8 09/01/2016 1031   CALCIUM 9.3 09/01/2016 1031   PROT 6.9 09/01/2016 1031   ALBUMIN 3.8 09/01/2016 1031   AST 40 (H) 09/01/2016 1031   ALT 44 09/01/2016 1031   ALKPHOS  65 09/01/2016 1031   BILITOT <0.22 09/01/2016 1031     Impression:  The patient is tolerating radiotherapy.  Plan:  Continue radiotherapy as planned. Patient instructed to apply Radiplex to intact skin in treatment fields and hydrocortisone 1% cream prn itching.    ________________________________   Eppie Gibson, M.D.

## 2016-09-23 ENCOUNTER — Ambulatory Visit
Admission: RE | Admit: 2016-09-23 | Discharge: 2016-09-23 | Disposition: A | Discharge status: Home / Self Care | Payer: BC Managed Care – PPO | Source: Ambulatory Visit | Attending: Radiation Oncology | Admitting: Radiation Oncology

## 2016-09-23 ENCOUNTER — Ambulatory Visit: Payer: BC Managed Care – PPO

## 2016-09-23 DIAGNOSIS — Z510 Encounter for antineoplastic radiation therapy: Secondary | ICD-10-CM | POA: Diagnosis not present

## 2016-09-24 ENCOUNTER — Ambulatory Visit
Admission: RE | Admit: 2016-09-24 | Discharge: 2016-09-24 | Disposition: A | Discharge status: Home / Self Care | Payer: BC Managed Care – PPO | Source: Ambulatory Visit | Attending: Radiation Oncology | Admitting: Radiation Oncology

## 2016-09-24 ENCOUNTER — Ambulatory Visit: Payer: BC Managed Care – PPO

## 2016-09-24 DIAGNOSIS — Z510 Encounter for antineoplastic radiation therapy: Secondary | ICD-10-CM | POA: Diagnosis not present

## 2016-09-25 ENCOUNTER — Ambulatory Visit
Admission: RE | Admit: 2016-09-25 | Discharge: 2016-09-25 | Disposition: A | Discharge status: Home / Self Care | Payer: BC Managed Care – PPO | Source: Ambulatory Visit | Attending: Radiation Oncology | Admitting: Radiation Oncology

## 2016-09-25 ENCOUNTER — Ambulatory Visit: Payer: BC Managed Care – PPO

## 2016-09-25 DIAGNOSIS — Z510 Encounter for antineoplastic radiation therapy: Secondary | ICD-10-CM | POA: Diagnosis not present

## 2016-09-26 ENCOUNTER — Ambulatory Visit
Admission: RE | Admit: 2016-09-26 | Discharge: 2016-09-26 | Disposition: A | Discharge status: Home / Self Care | Payer: BC Managed Care – PPO | Source: Ambulatory Visit | Attending: Radiation Oncology | Admitting: Radiation Oncology

## 2016-09-26 ENCOUNTER — Ambulatory Visit: Payer: BC Managed Care – PPO

## 2016-09-26 DIAGNOSIS — Z510 Encounter for antineoplastic radiation therapy: Secondary | ICD-10-CM | POA: Diagnosis not present

## 2016-09-29 ENCOUNTER — Encounter: Payer: Self-pay | Admitting: *Deleted

## 2016-09-29 ENCOUNTER — Ambulatory Visit: Payer: BC Managed Care – PPO

## 2016-09-29 ENCOUNTER — Ambulatory Visit
Admission: RE | Admit: 2016-09-29 | Discharge: 2016-09-29 | Disposition: A | Discharge status: Home / Self Care | Payer: BC Managed Care – PPO | Source: Ambulatory Visit | Attending: Radiation Oncology | Admitting: Radiation Oncology

## 2016-09-29 VITALS — BP 133/95 | HR 76 | Temp 97.7°F | Resp 20 | Wt 150.4 lb

## 2016-09-29 DIAGNOSIS — C50411 Malignant neoplasm of upper-outer quadrant of right female breast: Secondary | ICD-10-CM | POA: Diagnosis not present

## 2016-09-29 DIAGNOSIS — Z510 Encounter for antineoplastic radiation therapy: Secondary | ICD-10-CM | POA: Diagnosis not present

## 2016-09-29 DIAGNOSIS — Z170 Estrogen receptor positive status [ER+]: Secondary | ICD-10-CM | POA: Diagnosis not present

## 2016-09-29 DIAGNOSIS — C50412 Malignant neoplasm of upper-outer quadrant of left female breast: Secondary | ICD-10-CM | POA: Insufficient documentation

## 2016-09-29 MED ORDER — RADIAPLEXRX EX GEL
Freq: Once | CUTANEOUS | Status: AC
  Administered 2016-09-29: 11:00:00 via TOPICAL

## 2016-09-29 NOTE — Progress Notes (Signed)
Sabrina Tapia completed 30th and final fraction to left chest wall, right breast with boost treatment.  Patient complains of pain to bilateral axillary areas with movement is a 7 out of 10.  Redness with some peeling present in both axillary areas.  30 day follow up scheduled for April 20th @ 11:20  Gave patient tube of Radiaplex.  BP (!) 133/95 (BP Location: Right Arm, Patient Position: Sitting)   Pulse 76   Temp 97.7 F (36.5 C) (Oral)   Resp 20   Wt 150 lb 6.4 oz (68.2 kg)   SpO2 97%   BMI 25.82 kg/m    Wt Readings from Last 3 Encounters:  09/29/16 150 lb 6.4 oz (68.2 kg)  09/22/16 148 lb 6.4 oz (67.3 kg)  09/15/16 150 lb (68 kg)

## 2016-09-29 NOTE — Progress Notes (Signed)
   Weekly Management Note:  Outpatient    ICD-9-CM ICD-10-CM   1. Malignant neoplasm of upper-outer quadrant of right breast in female, estrogen receptor positive (HCC) 174.4 C50.411    V86.0 Z17.0   2. Malignant neoplasm of upper-outer quadrant of left breast in female, estrogen receptor positive (HCC) 174.4 C50.412    V86.0 Z17.0     Current Dose:  50.4 Gy to left chest wall and nodes / 60 Gy to right breast/axilla Projected Dose: 50.4 Gy / 60Gy  Narrative:  The patient presents for routine under treatment assessment.  CBCT/MVCT images/Port film x-rays were reviewed.  The chart was checked. Doing well,  finished Xeloda. Skin more irritated.  Physical Findings:  weight is 150 lb 6.4 oz (68.2 kg). Her oral temperature is 97.7 F (36.5 C). Her blood pressure is 133/95 (abnormal) and her pulse is 76. Her respiration is 20 and oxygen saturation is 97%.   Wt Readings from Last 3 Encounters:  09/29/16 150 lb 6.4 oz (68.2 kg)  09/22/16 148 lb 6.4 oz (67.3 kg)  09/15/16 150 lb (68 kg)   bright erythema so far over b/l chest , dry peeling with borderline moistness in axilla bilaterally   CBC    Component Value Date/Time   WBC 4.3 09/01/2016 1031   WBC 8.7 10/21/2011 0852   RBC 4.74 09/01/2016 1031   RBC 4.77 10/21/2011 0852   HGB 14.4 09/01/2016 1031   HCT 43.6 09/01/2016 1031   PLT 251 09/01/2016 1031   MCV 92.0 09/01/2016 1031   MCH 30.4 09/01/2016 1031   MCH 29.8 10/21/2011 0852   MCHC 33.0 09/01/2016 1031   MCHC 31.8 10/21/2011 0852   RDW 13.7 09/01/2016 1031   LYMPHSABS 0.5 (L) 09/01/2016 1031   MONOABS 0.8 09/01/2016 1031   EOSABS 0.0 09/01/2016 1031   BASOSABS 0.0 09/01/2016 1031   CMP     Component Value Date/Time   NA 133 (L) 09/01/2016 1031   K 4.6 09/01/2016 1031   CO2 25 09/01/2016 1031   GLUCOSE 91 09/01/2016 1031   BUN 10.4 09/01/2016 1031   CREATININE 0.8 09/01/2016 1031   CALCIUM 9.3 09/01/2016 1031   PROT 6.9 09/01/2016 1031   ALBUMIN 3.8 09/01/2016  1031   AST 40 (H) 09/01/2016 1031   ALT 44 09/01/2016 1031   ALKPHOS 65 09/01/2016 1031   BILITOT <0.22 09/01/2016 1031     Impression:  The patient is tolerating radiotherapy.  Plan:  Continue radiotherapy as planned. Patient instructed to apply Radiplex to intact skin in treatment fields and hydrocortisone 1% cream prn itching.    Try teabags seeping with cool tea juices over skin for soothing effect. F/u in 96mo.  ________________________________   Eppie Gibson, M.D.

## 2016-09-29 NOTE — Addendum Note (Signed)
Encounter addended by: Ernst Spell, RN on: 09/29/2016 10:47 AM<BR>    Actions taken: Order list changed, Diagnosis association updated, MAR administration accepted

## 2016-09-30 ENCOUNTER — Ambulatory Visit: Payer: BC Managed Care – PPO

## 2016-10-01 ENCOUNTER — Ambulatory Visit: Payer: BC Managed Care – PPO

## 2016-10-02 ENCOUNTER — Ambulatory Visit: Payer: BC Managed Care – PPO

## 2016-10-03 ENCOUNTER — Other Ambulatory Visit: Payer: Self-pay

## 2016-10-03 ENCOUNTER — Encounter: Payer: Self-pay | Admitting: Radiation Oncology

## 2016-10-03 MED ORDER — FLUTICASONE PROPIONATE HFA 110 MCG/ACT IN AERO: 1.0000 | INHALATION_SPRAY | Freq: Two times a day (BID) | RESPIRATORY_TRACT | 0 refills | Status: DC

## 2016-10-03 NOTE — Progress Notes (Signed)
  Radiation Oncology         [phone] ________________________________  Name: Sabrina Tapia MRN: [id number]  Date: 10/03/2016  DOB: 10-18-1950  End of Treatment Note  Diagnosis:    Indication for treatment:  Curative       Radiation treatment dates:  08/19/16-09/29/16  Site/dose:   1) Right breast and axilla/ 50Gy in 25 fractions   2) Right breast boost/ 10 Gy in 5 fractions   3) Left chestwall SCV and axilla / 50.4 Gy in 28 fractions  Beams/energy:  1) 3D / 10X, 6X    2) Isodose plan/ 15X, 6X    3) 3D /10X, 15X, 6X  Narrative: The patient tolerated radiation treatment relatively well. During treatment she experienced worsening erythema over bilateral chest with dry desquamation.  Plan: The patient has completed radiation treatment. The patient will return to radiation oncology clinic for routine followup in one month. I advised them to call or return sooner if they have any questions or concerns related to their recovery or treatment.  -----------------------------------  Eppie Gibson, MD  This document serves as a record of services personally performed by Eppie Gibson, MD. It was created on her behalf by Bethann Humble, a trained medical scribe. The creation of this record is based on the scribe's personal observations and the provider's statements to them. This document has been checked and approved by the attending provider.

## 2016-10-24 ENCOUNTER — Ambulatory Visit: Payer: Self-pay | Admitting: Radiation Oncology

## 2016-10-24 NOTE — Progress Notes (Signed)
Photon Boost Complex Emergency planning/management officer Note Outpatient  Diagnosis:   2. Malignant neoplasm of upper-outer quadrant of right breast in female, estrogen receptor positive ([place]) 174.4 C50.411    V86.0 Z17.0    The patient's CT images from her simulation were reviewed to plan her boost treatment to her right breast  lumpectomy cavity.  The boost to the lumpectomy cavity will be delivered with 2 photon fields using MLCs for custom blocks again heart and lungs, with 6 and 15 MV photon energy.  This constitutes 2 complex treatment devices. Isodose plan was reviewed and approved. 10 Gy in 5 fractions prescribed.  -----------------------------------  Eppie Gibson, MD

## 2016-10-27 ENCOUNTER — Ambulatory Visit (HOSPITAL_BASED_OUTPATIENT_CLINIC_OR_DEPARTMENT_OTHER): Payer: BC Managed Care – PPO | Admitting: Oncology

## 2016-10-27 ENCOUNTER — Telehealth: Payer: Self-pay | Admitting: Oncology

## 2016-10-27 ENCOUNTER — Other Ambulatory Visit (HOSPITAL_BASED_OUTPATIENT_CLINIC_OR_DEPARTMENT_OTHER): Payer: BC Managed Care – PPO

## 2016-10-27 VITALS — BP 136/85 | HR 82 | Temp 98.3°F | Resp 20 | Ht 64.0 in | Wt 153.2 lb

## 2016-10-27 DIAGNOSIS — C50812 Malignant neoplasm of overlapping sites of left female breast: Secondary | ICD-10-CM

## 2016-10-27 DIAGNOSIS — C50411 Malignant neoplasm of upper-outer quadrant of right female breast: Secondary | ICD-10-CM

## 2016-10-27 DIAGNOSIS — N951 Menopausal and female climacteric states: Secondary | ICD-10-CM

## 2016-10-27 DIAGNOSIS — C773 Secondary and unspecified malignant neoplasm of axilla and upper limb lymph nodes: Secondary | ICD-10-CM | POA: Diagnosis not present

## 2016-10-27 DIAGNOSIS — Z170 Estrogen receptor positive status [ER+]: Secondary | ICD-10-CM

## 2016-10-27 DIAGNOSIS — M2550 Pain in unspecified joint: Secondary | ICD-10-CM

## 2016-10-27 DIAGNOSIS — C50112 Malignant neoplasm of central portion of left female breast: Secondary | ICD-10-CM

## 2016-10-27 DIAGNOSIS — C50412 Malignant neoplasm of upper-outer quadrant of left female breast: Secondary | ICD-10-CM

## 2016-10-27 DIAGNOSIS — M8580 Other specified disorders of bone density and structure, unspecified site: Secondary | ICD-10-CM

## 2016-10-27 DIAGNOSIS — M791 Myalgia: Secondary | ICD-10-CM

## 2016-10-27 LAB — CBC WITH DIFFERENTIAL/PLATELET
BASO%: 0.7 % (ref 0.0–2.0)
Basophils Absolute: 0 10*3/uL (ref 0.0–0.1)
EOS%: 0.1 % (ref 0.0–7.0)
Eosinophils Absolute: 0 10*3/uL (ref 0.0–0.5)
HEMATOCRIT: 41.6 % (ref 34.8–46.6)
HEMOGLOBIN: 14 g/dL (ref 11.6–15.9)
LYMPH#: 0.9 10*3/uL (ref 0.9–3.3)
LYMPH%: 17.3 % (ref 14.0–49.7)
MCH: 31.7 pg (ref 25.1–34.0)
MCHC: 33.8 g/dL (ref 31.5–36.0)
MCV: 93.7 fL (ref 79.5–101.0)
MONO#: 0.6 10*3/uL (ref 0.1–0.9)
MONO%: 11.8 % (ref 0.0–14.0)
NEUT%: 70.1 % (ref 38.4–76.8)
NEUTROS ABS: 3.7 10*3/uL (ref 1.5–6.5)
PLATELETS: 309 10*3/uL (ref 145–400)
RBC: 4.44 10*6/uL (ref 3.70–5.45)
RDW: 16.8 % — AB (ref 11.2–14.5)
WBC: 5.3 10*3/uL (ref 3.9–10.3)

## 2016-10-27 LAB — COMPREHENSIVE METABOLIC PANEL
ALT: 36 U/L (ref 0–55)
ANION GAP: 9 meq/L (ref 3–11)
AST: 35 U/L — AB (ref 5–34)
Albumin: 4 g/dL (ref 3.5–5.0)
Alkaline Phosphatase: 52 U/L (ref 40–150)
BILIRUBIN TOTAL: 0.39 mg/dL (ref 0.20–1.20)
BUN: 13.1 mg/dL (ref 7.0–26.0)
CALCIUM: 9.4 mg/dL (ref 8.4–10.4)
CHLORIDE: 101 meq/L (ref 98–109)
CO2: 23 mEq/L (ref 22–29)
CREATININE: 0.8 mg/dL (ref 0.6–1.1)
EGFR: 84 mL/min/{1.73_m2} — ABNORMAL LOW (ref >90)
Glucose: 88 mg/dl (ref 70–140)
Potassium: 4.1 mEq/L (ref 3.5–5.1)
Sodium: 133 mEq/L — ABNORMAL LOW (ref 136–145)
Total Protein: 6.7 g/dL (ref 6.4–8.3)

## 2016-10-27 NOTE — Telephone Encounter (Signed)
Gave patient avs report and appointments for July (SCP visit) and August.

## 2016-10-27 NOTE — Progress Notes (Signed)
[facility]  [phone] [phone]     ID: VENICE MARCUCCI DOB: 05/27/1951  MR#: [id number]  ULA#:[id number]  Patient Care Team: Josetta Huddle, MD as PCP - General (Internal Medicine) Rolm Bookbinder, MD as Consulting Physician (General Surgery) Chauncey Cruel, MD as Consulting Physician (Oncology) Anastasio Auerbach, MD as Consulting Physician (Gynecology) OTHER MD:  CHIEF COMPLAINT: Estrogen receptor positive breast cancer  CURRENT TREATMENT: Undergoing adjuvant radiation with Xeloda as sensitization   BREAST CANCER HISTORY: From the earlier summary note:  "[name]" had screening mammography at her gynecologist's office suggestive of a change in the upper-outer quadrant of the right breast. She was referred to [facility] where on 03/04/2016 she underwent bilateral diagnostic mammography and right breast ultrasonography. The breast density was category C. In the upper outer quadrant of the right breast there was a new area of architectural distortion. By ultrasonography this measured 0.5 cm. The right axilla was reported as sonographically benign.  On 03/06/2016 she underwent core needle biopsy of the right breast mass in question, and this showed ([id number]) an invasive ductal carcinoma, grade 1, estrogen receptor 95% positive, progesterone receptor 95% positive, both with strong staining intensity, with an MIB-1 of 12%, and no HER-2 amplification, the signals ratio being 1.54 and the number per cell 2.15.  Her case was presented in the multidisciplinary breast cancer conference 03/12/2016.Marland Kitchen At that time a preliminary plan was proposed: Breast conserving surgery with sentinel lymph node sampling, no Oncotype if the tumor was no larger than 5 mm, and consideration of genetics testing.  On 03/26/2016 [name] underwent right lumpectomy and sentinel lymph node sampling. This showed ([id number]) an invasive ductal carcinoma measuring 1.4 cm, grade 1, with  negative margins, and a macrometastatic deposit of ductal carcinoma in the single sentinel lymph node. Mammaprint from this tumor was read as "low risk".  However there was a separate invasive lobular carcinoma measuring 0.6 cm,  focally involving the final right medial margin. Because of the lobular breast cancer bilateral breast MRI was obtained 04/04/2016. This showed in the right breast a 7.4 x 4.5 cm seroma with 2 enhancing masses posterior to the lumpectomy cavity, the larger measuring 1.0 cm. These were felt possibly to be reactive lymph nodes. One of these lymph nodes was biopsied 04/09/2016 and this showed ([id number]) benign lymph node tissue. The MRI showed no evidence of additional disease in the right breast.  However in the left breast centrally there was a 1.3 cm enhancing mass. Biopsy of this left breast mass 04/09/2016 ([id number]) showed an invasive ductal carcinoma, grade 1 or 2, with insufficient tissue for a prognostic panel. On 04/11/2016 biopsy of a second left breast mass, upper outer quadrant, 4.6 cm a way from the other left breast mass, showed ([id number]) an invasive ductal carcinoma, grade 2, estrogen receptor 10% positive with strong staining intensity, progesterone receptor negative, with an MIB-1 of 2%, and no HER-2 amplification, the signals ratio being 1.50 and the number per cell 2.10. The proliferation fraction was 2%.  On 04/14/2016 [name] underwent biopsy of a suspicious left axillary lymph node and this ([id number]) was positive for invasive ductal carcinoma, estrogen and progesterone receptor negative, with an MIB-1 of 10%. I do not find that HER-2 was repeated. Mammaprint from this tumor also came back low risk.  Her subsequent history is as detailed below.  INTERVAL HISTORY: Sabrina Tapia returns today for follow-up of her estrogen receptor positive breast cancer. She completed her radiation treatments  the first week in March. She generally did "fine". After the  fifth week she became dehydrated and this led to constipation A she still taking MiraLAX but plans to transition to Metamucil, which was working well for her before. She also noted some fatigue towards the ends of treatment but this has not slowed her down much and she of course is probably NEDs, and walking.  She did have minimal peeling particularly in the armpits, left more than right. This is healing very nicely, she tells me.  She tolerated the capecitabine sensitization without any side effects that she is aware of  She continues on tamoxifen, with hot flashes as her only significant side effect. This makes her "red like to be" when it occurs. She doesn't sweats. It doesn't wake her up at night. Vaginal wetness is not an issue. She obtains the drug at a good price.  REVIEW OF SYSTEMS: A detailed review of systems today was otherwise stable PAST MEDICAL HISTORY: Past Medical History:  Diagnosis Date  . Arthritis   . Asthma    allergy induced  . Cancer ([place]) 2017   bil breast cancer  . Cervical polyp   . Detrusor instability   . GERD (gastroesophageal reflux disease)   . Heart burn   . Hemorrhoids   . Hypertonicity of bladder   . Joint pain   . LGSIL (low grade squamous intraepithelial dysplasia) 2001   colpo biopsy negative, negative paps since  . Osteopenia 02/2016   T score -1.3 FRAX 8.3%/0.7% stable from prior DEXA    PAST SURGICAL HISTORY: Past Surgical History:  Procedure Laterality Date  . BREAST RECONSTRUCTION WITH PLACEMENT OF TISSUE EXPANDER AND FLEX HD (ACELLULAR HYDRATED DERMIS) Left 07/01/2016   Procedure: BREAST RECONSTRUCTION WITH PLACEMENT OF TISSUE EXPANDER AND  ALLODERM;  Surgeon: Irene Limbo, MD;  Location: [facility];  Service: Plastics;  Laterality: Left;  . INCISION AND DRAINAGE Left 06/02/2016   Procedure: DRAINAGE of left axilla seroma;  Surgeon: Rolm Bookbinder, MD;  Location: [facility];  Service: General;   Laterality: Left;  . NIPPLE SPARING MASTECTOMY Left 07/01/2016   Procedure: LEFT NIPPLE SPARING MASTECTOMY;  Surgeon: Rolm Bookbinder, MD;  Location: [facility];  Service: General;  Laterality: Left;  . RADIOACTIVE SEED GUIDED MASTECTOMY WITH AXILLARY SENTINEL LYMPH NODE BIOPSY Right 03/26/2016   Procedure: RIGHT BREAST LUMPECTOMY WITH RADIOACTIVE SEED AND SENTINEL LYMPH NODE BIOPSY;  Surgeon: Rolm Bookbinder, MD;  Location: [facility] [place];  Service: General;  Laterality: Right;  . RADIOACTIVE SEED GUIDED MASTECTOMY WITH AXILLARY SENTINEL LYMPH NODE BIOPSY Left 05/20/2016   Procedure: LEFT BREAST RADIOACTIVE SEED (two seeds) GUIDED LUMPECTOMY WITH LEFT AXILLARY SENTINEL LYMPH NODE BIOPSY AND LEFT SEED TARGETED AXILLARY LYMPH NODE EXCISION;  Surgeon: Rolm Bookbinder, MD;  Location: [facility];  Service: General;  Laterality: Left;  . RE-EXCISION OF BREAST CANCER,SUPERIOR MARGINS Right 05/20/2016   Procedure: RE-EXCISION OF RIGHT BREAST MARGIN;  Surgeon: Rolm Bookbinder, MD;  Location: [place];  Service: General;  Laterality: Right;  . RE-EXCISION OF BREAST CANCER,SUPERIOR MARGINS Left 06/02/2016   Procedure: RE-EXCISION OF BREAST CANCER,SUPERIOR MARGINS;  Surgeon: Rolm Bookbinder, MD;  Location: [facility];  Service: General;  Laterality: Left;    FAMILY HISTORY Family History  Problem Relation Age of Onset  . Hypertension Mother   . Heart disease Mother   . Lung disease Mother   . Hypertension Father   . Cancer Father  colon  . Heart disease Father   . Lung cancer Father   . Breast cancer Paternal Grandmother     Age 110's  . Hypertension Brother   . Hyperlipidemia Brother   . Diabetes Maternal Grandfather   The patient's father died from lung cancer at the age of 60. He also had a remote history of colon cancer. His mother had a history of breast cancer in her 49s. The patient's mother died at age 26. The  patient has one brother, no sisters.  GYNECOLOGIC HISTORY:  No LMP recorded. Patient is postmenopausal. Menarche age 72, the patient is GX P0. 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 She lives alone, with no pets.    ADVANCED DIRECTIVES: In place. She has named her brother Marya Amsler as her healthcare part of attorney. He can be reached at [phone]/29/2006   HEALTH MAINTENANCE: Social History  Substance Use Topics  . Smoking status: Former Smoker    Quit date: 12/20/1984  . Smokeless tobacco: Never Used  . Alcohol use 7.2 oz/week    12 Cans of beer per week     Comment: daily beer     Colonoscopy:February 2016  PAP:  Bone density: 03/04/2016/osteopenia   Allergies  Allergen Reactions  . Augmentin [Amoxicillin-Pot Clavulanate] Itching and Nausea Only  . Adhesive [Tape] Rash    Current Outpatient Prescriptions  Medication Sig Dispense Refill  . acetaminophen (TYLENOL) 500 MG tablet Take 1,000 mg by mouth every 6 (six) hours as needed.    Marland Kitchen albuterol (PROAIR HFA) 108 (90 Base) MCG/ACT inhaler Inhale 2 puffs into the lungs every 4 (four) hours as needed for wheezing or shortness of breath. 1 Inhaler 1  . ALPRAZolam (XANAX) 0.25 MG tablet Take 1 tablet (0.25 mg total) by mouth at bedtime as needed. for sleep 30 tablet 1  . beclomethasone (QVAR) 40 MCG/ACT inhaler INHALE 2 PUFFS BY MOUTH TWICE DAILY TO PREVENT COUGH OR WHEEZING. RINSE, GARGLE AND SPIT AFTER USE 8.7 g 4  . Calcium Carbonate-Vitamin D (CALCIUM + D PO) Take by mouth.    . capecitabine (XELODA) 500 MG tablet Take 2 tablets (1,000 mg total) by mouth 2 (two) times daily after a meal. 120 tablet 0  . cefUROXime (CEFTIN) 250 MG tablet Take 1 tablet (250 mg total) by mouth 2 (two) times daily with a meal. 14 tablet 0  . cetirizine (ZYRTEC) 10 MG  tablet Take 10 mg by mouth.    . Cholecalciferol (VITAMIN D) 2000 units tablet Take 1 tablet (2,000 Units total) by mouth daily.    Marland Kitchen docusate sodium (COLACE) 50 MG capsule Take 50 mg by mouth.    . fluticasone (FLONASE) 50 MCG/ACT nasal spray Place 2 sprays into both nostrils daily. 16 g 5  . fluticasone (FLOVENT HFA) 110 MCG/ACT inhaler Inhale 1 puff into the lungs 2 (two) times daily. 1 Inhaler 0  . gabapentin (NEURONTIN) 300 MG capsule Take 300 mg by mouth at bedtime as needed and may repeat dose one time if needed.    . hyaluronate sodium (RADIAPLEXRX) GEL Apply 1 application topically once.    Marland Kitchen PRILOSEC OTC 20 MG tablet Reported on 12/14/2015    . tamoxifen (NOLVADEX) 20 MG tablet Take 20 mg by mouth daily.    . Turmeric 450 MG CAPS Take by mouth.    Marland Kitchen  venlafaxine XR (EFFEXOR-XR) 75 MG 24 hr capsule Take 1 capsule (75 mg total) by mouth daily with breakfast. 90 capsule 12   No current facility-administered medications for this visit.     OBJECTIVE: Middle-aged white woman   Vitals:   10/27/16 1439  BP: 136/85  Pulse: 82  Resp: 20  Temp: 98.3 F (36.8 C)     Body mass index is 26.3 kg/m.    ECOG FS:1 - Symptomatic but completely ambulatory  Sclerae unicteric, pupils round and equal Oropharynx clear and moist No cervical or supraclavicular adenopathy Lungs no rales or rhonchi Heart regular rate and rhythm Abd soft, nontender, positive bowel sounds MSK no focal spinal tenderness, no upper extremity lymphedema Neuro: nonfocal, well oriented, appropriate affect Breasts: She is status post bilateral lumpectomies followed by bilateral radiation treatments. There is minimal residual erythema. The cosmetic result is excellent. There is no evidence of local recurrence. Both axillae are benign.  LAB RESULTS:  CMP     Component Value Date/Time   NA 133 (L) 10/27/2016 1426   K 4.1 10/27/2016 1426   CO2 23 10/27/2016 1426   GLUCOSE 88 10/27/2016 1426   BUN 13.1 10/27/2016 1426     CREATININE 0.8 10/27/2016 1426   CALCIUM 9.4 10/27/2016 1426   PROT 6.7 10/27/2016 1426   ALBUMIN 4.0 10/27/2016 1426   AST 35 (H) 10/27/2016 1426   ALT 36 10/27/2016 1426   ALKPHOS 52 10/27/2016 1426   BILITOT 0.39 10/27/2016 1426    INo results found for: SPEP, UPEP  Lab Results  Component Value Date   WBC 5.3 10/27/2016   NEUTROABS 3.7 10/27/2016   HGB 14.0 10/27/2016   HCT 41.6 10/27/2016   MCV 93.7 10/27/2016   PLT 309 10/27/2016      Chemistry      Component Value Date/Time   NA 133 (L) 10/27/2016 1426   K 4.1 10/27/2016 1426   CO2 23 10/27/2016 1426   BUN 13.1 10/27/2016 1426   CREATININE 0.8 10/27/2016 1426      Component Value Date/Time   CALCIUM 9.4 10/27/2016 1426   ALKPHOS 52 10/27/2016 1426   AST 35 (H) 10/27/2016 1426   ALT 36 10/27/2016 1426   BILITOT 0.39 10/27/2016 1426       No results found for: LABCA2  No components found for: LABCA125  No results for input(s): INR in the last 168 hours.  Urinalysis    Component Value Date/Time   COLORURINE YELLOW 01/31/2016 1526   APPEARANCEUR CLEAR 01/31/2016 1526   LABSPEC 1.006 01/31/2016 1526   PHURINE 6.5 01/31/2016 1526   GLUCOSEU NEGATIVE 01/31/2016 1526   HGBUR NEGATIVE 01/31/2016 1526   BILIRUBINUR NEGATIVE 01/31/2016 1526   KETONESUR NEGATIVE 01/31/2016 1526   PROTEINUR NEGATIVE 01/31/2016 1526   UROBILINOGEN 0.2 12/11/2014 1506   NITRITE NEGATIVE 01/31/2016 1526   LEUKOCYTESUR TRACE (A) 01/31/2016 1526     STUDIES: No results found.  ELIGIBLE FOR AVAILABLE RESEARCH PROTOCOL: no  ASSESSMENT: 66 y.o. [place] woman status post right breast upper outer quadrant lumpectomy 03/26/2016 for a pT1c pN1, stage IIA invasive ductal carcinoma, grade 1, estrogen and progesterone receptor positive, HER-2 nonamplified, with an MIB-1 of 12%   (a) mammaprint from this tumor was read as "low risk".   (1) a second right breast cancer, invasive lobular, grade 1, measuring 0.6 cm, focally  involved the final right medial margin from the 03/26/2016 procedure; this tumor also was estrogen and progesterone receptor positive, HER-2 negative, with an MIB-1  of 3%  (a) medial margin cleared with additional surgery 05/20/2016.  (2) two biopsies from the left breast 04/09/2016 and 04/11/2016, 4.6 cm apart, showed  (a) centrally, an invasive ductal carcinoma, grade 1 or 2, with no prognostic panel available  (b) in the upper outer quadrant, invasive ductal carcinoma, grade 2, estrogen receptor 10% positive, progesterone receptor and HER-2 negative, with an MIB-1 of 2%  (c) left axillary lymph node biopsy 04/14/2016 showed invasive ductal carcinoma, estrogen and progesterone receptor negative  (d) mammaprint from (2)(c) was also "low risk"  (3) left lumpectomy 05/20/2016 showed a  pT1c pN1 stage IIA  invasive ductal carcinoma, grade 2, estrogen receptor positive, progesterone receptor negative, HER-2 nonamplified, with an MIB-1 of 2%; margins were positive   (4) status post left sided nipple sparing mastectomy with immediate expander placement 07/01/2016 showing multiple foci of residual grade 2 invasive ductal carcinoma, the largest measuring 0.7 cm, with evidence of lymphovascular invasion and multiple close but negative margins  (5) radiation therapy 08/19/16-03/05/18with capecitabine sensitization Site/dose:   1) Right breast/ 50Gy in 25 fractions                         2) Right breast boost/ 10 Gy in 5 fractions                         3) Left breast 50.4 Gy in 28 fractions   (6) tamoxifen started 04/18/2016--to be continued a minimum of 5 years, likely followed by anastrozole x 2 years   PLAN; Sabrina Tapia has now completed all her local treatments. She has done remarkably well throughout. She is having a little bit of "chemo brain" even though she never did receive chemotherapy and today we discussed issues related to posttraumatic stress.  She is tolerating tamoxifen well. The plan  will be to continue that for a total of 5 years, after which we very likely will switch her to aromatase inhibitor for 2 additional years.  I don't think the arthralgias and myalgias she is experiencing are related to tamoxifen. More related to stress and to the fact that she could not exercise 4.. She is now getting back into her usual routine and I expect many of these problems will fade away on their own.  Hopefully her hot flashes also will slowly decrease. Studies documented a measurable decrease around 9 months after the start of anastrozole. Of course this is very variable from patient to patient  She is planning to give a big party on May 4 to celebrate her coming through this complicated experience intact.  She will see me again in August. She knows to call for any problems that may develop before that visit. :Chauncey Cruel, MD   10/27/2016 3:50 PM Medical Oncology and Hematology [facility] [place], [place] [address] Tel. [phone]    Fax. [phone]

## 2016-11-07 ENCOUNTER — Ambulatory Visit: Payer: Self-pay | Admitting: Radiation Oncology

## 2016-11-12 ENCOUNTER — Encounter: Payer: Self-pay | Admitting: Radiation Oncology

## 2016-11-14 ENCOUNTER — Encounter: Payer: Self-pay | Admitting: Radiation Oncology

## 2016-11-14 ENCOUNTER — Ambulatory Visit
Admission: RE | Admit: 2016-11-14 | Discharge: 2016-11-14 | Disposition: A | Discharge status: Home / Self Care | Payer: BC Managed Care – PPO | Source: Ambulatory Visit | Attending: Radiation Oncology | Admitting: Radiation Oncology

## 2016-11-14 VITALS — BP 136/89 | HR 69 | Temp 98.6°F | Ht 64.0 in | Wt 150.6 lb

## 2016-11-14 DIAGNOSIS — Z853 Personal history of malignant neoplasm of breast: Secondary | ICD-10-CM | POA: Diagnosis not present

## 2016-11-14 DIAGNOSIS — Z79810 Long term (current) use of selective estrogen receptor modulators (SERMs): Secondary | ICD-10-CM | POA: Insufficient documentation

## 2016-11-14 DIAGNOSIS — C50812 Malignant neoplasm of overlapping sites of left female breast: Secondary | ICD-10-CM

## 2016-11-14 DIAGNOSIS — Z08 Encounter for follow-up examination after completed treatment for malignant neoplasm: Secondary | ICD-10-CM | POA: Insufficient documentation

## 2016-11-14 DIAGNOSIS — C50411 Malignant neoplasm of upper-outer quadrant of right female breast: Secondary | ICD-10-CM

## 2016-11-14 DIAGNOSIS — Z170 Estrogen receptor positive status [ER+]: Secondary | ICD-10-CM

## 2016-11-14 HISTORY — DX: Personal history of irradiation: Z92.3

## 2016-11-14 NOTE — Progress Notes (Signed)
Radiation Oncology         [phone]) [phone] ________________________________  Name: Sabrina Tapia MRN: [id number]  Date: 11/14/2016  DOB: 03/12/1951  Follow-Up Visit Note  Outpatient  CC: Henrine Screws, MD  Rolm Bookbinder, MD  Diagnosis and Prior Radiotherapy:    ICD-9-CM ICD-10-CM   1. Malignant neoplasm of overlapping sites of left breast in female, estrogen receptor positive ([place]) 174.8 C50.812    V86.0 Z17.0   2. Malignant neoplasm of upper-outer quadrant of right breast in female, estrogen receptor positive ([place]) 174.4 C50.411    V86.0 Z17.0      Stage IIA pT1c, pN1a, M0 Right Breast UOQ Invasive Ductal Carcinoma In Situ with Calcifications, ER 95% / PR 95% / Her2-, Grade 1  Stage IIB Left Breast pT2, pN1a, M0 UOQ Invasive Ductal Carcinoma with Calcifications In Situ, ER 10% / PR- / Her2-, Grade 2  Radiation treatment dates:  08/19/16-09/29/16 Site/dose: 1) Right breast and axilla/ 50Gy in 25 fractions 2) Right breast boost/ 10 Gy in 5 fractions 3) Left chestwall SCV and axilla / 50.4 Gy in 28 fractions  CHIEF COMPLAINT: Here for follow-up and surveillance of left and right breast cancer  Narrative:  The patient returns today for routine follow-up. The patient has been on Tamoxifen since 04/18/16. The patient followed up with Dr. Jana Hakim on 10/27/16. The patient reports tightness of her left breast and hyperpigmentation of her bilateral axillae. She has an appointment for survivorship on 02/13/17.  ALLERGIES:  is allergic to augmentin [amoxicillin-pot clavulanate] and adhesive [tape].  Meds: Current Outpatient Prescriptions  Medication Sig Dispense Refill  . acetaminophen (TYLENOL) 500 MG tablet Take 1,000 mg by mouth every 6 (six) hours as needed.    Marland Kitchen albuterol (PROAIR HFA) 108 (90 Base) MCG/ACT inhaler Inhale 2 puffs into the lungs every 4 (four) hours as needed for wheezing or shortness of breath. 1 Inhaler 1  . ALPRAZolam (XANAX) 0.25 MG tablet Take 1 tablet  (0.25 mg total) by mouth at bedtime as needed. for sleep 30 tablet 1  . Calcium Carbonate-Vitamin D (CALCIUM + D PO) Take by mouth.    . cetirizine (ZYRTEC) 10 MG tablet Take 10 mg by mouth.    . Cholecalciferol (VITAMIN D) 2000 units tablet Take 1 tablet (2,000 Units total) by mouth daily.    Marland Kitchen docusate sodium (COLACE) 50 MG capsule Take 50 mg by mouth.    . fluticasone (FLOVENT HFA) 110 MCG/ACT inhaler Inhale 1 puff into the lungs 2 (two) times daily. 1 Inhaler 0  . gabapentin (NEURONTIN) 300 MG capsule Take 300 mg by mouth at bedtime as needed and may repeat dose one time if needed.    Marland Kitchen PRILOSEC OTC 20 MG tablet Reported on 12/14/2015    . tamoxifen (NOLVADEX) 20 MG tablet Take 20 mg by mouth daily.    . Turmeric 450 MG CAPS Take by mouth.    . venlafaxine XR (EFFEXOR-XR) 75 MG 24 hr capsule Take 1 capsule (75 mg total) by mouth daily with breakfast. 90 capsule 12  . hyaluronate sodium (RADIAPLEXRX) GEL Apply 1 application topically once.     No current facility-administered medications for this encounter.     Physical Findings: The patient is in no acute distress. Patient is alert and oriented.  height is _0  (1.626 m) and weight is 150 lb 9.6 oz (68.3 kg). Her temperature is 98.6 F (37 C). Her blood pressure is 136/89 and her pulse is 69. Her oxygen saturation is 98%. Marland Kitchen  General: Alert and oriented, in no acute distress Heart: Regular in rate and rhythm with no murmurs, rubs, or gallops. Chest: Clear to auscultation bilaterally, with no rhonchi, wheezes, or rales. Psychiatric: Judgment and insight are intact. Affect is appropriate. BREAST: No palpable or visible sign of recurrence over the right breast or the left reconstructed breast. No palpable masses in the axillae bilaterally. Tiny white heads/plugged hair follicles over the left reconstructed breast, no sign of recurrence.  Lab Findings: Lab Results  Component Value Date   WBC 5.3 10/27/2016   HGB 14.0 10/27/2016    HCT 41.6 10/27/2016   MCV 93.7 10/27/2016   PLT 309 10/27/2016    Radiographic Findings: No results found.  Impression/Plan:  She has healed well from radiotherapy. She was advised to continue follow ups with Med/Onc and continue the use of Tamoxifen. She was advised to continue applying cetaphil cream to her right breast. I will see her on a PRN basis.  The patient is scheduled for survivorship on 02/13/17 and a follow up with Dr. Jana Hakim on 03/12/17.   _____________________________________   Eppie Gibson, MD  This document serves as a record of services personally performed by Eppie Gibson, MD. It was created on her behalf by Darcus Austin, a trained medical scribe. The creation of this record is based on the scribe's personal observations and the provider's statements to them. This document has been checked and approved by the attending provider.

## 2016-11-14 NOTE — Progress Notes (Signed)
Sabrina Tapia presents for follow up of radiation completed 09/29/16 to her Right and Left Breast. She reports tightness to her Left Breast. She has hyperpigmentation to her Bilateral axillas. She is taking Tamoxifen daily. She has an appointment for survivorship on 02/13/17. Her next appointment with Dr. Jana Hakim is 03/12/17.   BP 136/89   Pulse 69   Temp 98.6 F (37 C)   Ht 5\' 4"  (1.626 m)   Wt 150 lb 9.6 oz (68.3 kg)   SpO2 98% Comment: room air  BMI 25.85 kg/m    Wt Readings from Last 3 Encounters:  11/14/16 150 lb 9.6 oz (68.3 kg)  10/27/16 153 lb 3.2 oz (69.5 kg)  09/29/16 150 lb 6.4 oz (68.2 kg)

## 2016-11-14 NOTE — Addendum Note (Signed)
Encounter addended by: Ernst Spell, RN on: 11/14/2016  1:40 PM<BR>    Actions taken: Charge Capture section accepted

## 2016-11-20 ENCOUNTER — Encounter: Payer: Self-pay | Admitting: *Deleted

## 2016-11-25 ENCOUNTER — Other Ambulatory Visit: Payer: Self-pay | Admitting: Allergy & Immunology

## 2016-11-26 NOTE — Telephone Encounter (Signed)
I gave 1 refill with no extra refills. Patient was last seen 12/14/15 and was asked to follow up in 4 months. Patient needs office visit for further refills.

## 2016-12-10 ENCOUNTER — Ambulatory Visit (INDEPENDENT_AMBULATORY_CARE_PROVIDER_SITE_OTHER): Payer: BC Managed Care – PPO | Admitting: Obstetrics and Gynecology

## 2016-12-10 ENCOUNTER — Encounter: Payer: Self-pay | Admitting: Obstetrics and Gynecology

## 2016-12-10 VITALS — BP 110/64 | HR 80 | Resp 14 | Ht 63.5 in | Wt 149.6 lb

## 2016-12-10 DIAGNOSIS — Z170 Estrogen receptor positive status [ER+]: Secondary | ICD-10-CM

## 2016-12-10 DIAGNOSIS — C50912 Malignant neoplasm of unspecified site of left female breast: Secondary | ICD-10-CM

## 2016-12-10 DIAGNOSIS — Z113 Encounter for screening for infections with a predominantly sexual mode of transmission: Secondary | ICD-10-CM | POA: Diagnosis not present

## 2016-12-10 DIAGNOSIS — N951 Menopausal and female climacteric states: Secondary | ICD-10-CM | POA: Diagnosis not present

## 2016-12-10 DIAGNOSIS — M8580 Other specified disorders of bone density and structure, unspecified site: Secondary | ICD-10-CM

## 2016-12-10 DIAGNOSIS — N3281 Overactive bladder: Secondary | ICD-10-CM | POA: Diagnosis not present

## 2016-12-10 NOTE — Patient Instructions (Signed)
   It was a pleasure to meet you today.  

## 2016-12-10 NOTE — Progress Notes (Signed)
66 y.o. G0P0 Single Caucasian female here for establish care.  Annual exam in due in July.   Patient Dx'd 03-06-16 with Rt.Breast carcinoma -- estrogen receptor 95% positive, progesterone receptor 95% positive and Cx'd 03-26-16 with Lt.Breast invasive ductal carcinoma.  Having breast reconstruction in the Fall with [facility].  On Tamoxifen.   She did take HRT.  Taking Valtrex for HSV I. Also had shingles. Will do the new recombinant DNA vaccine.  Rx from Dermatologist.   Osteopenia dx last year.   Urinary incontinence. Has overactive bladder.  Hd urodynamics twice.  Did PT for the bladder which was helpful. Vesicare cause bowel problems. Stopped this.  Avoiding dietary irritants.  No leak with cough, laugh, sneeze, or exercise.   Labs with PCP.   PCP:   R.Mertha Finders, MD  Patient's last menstrual period was 07/28/1996 (approximate).           Sexually active: No. female The current method of family planning is post menopausal status.    Exercising: Yes.    Pilates, yoga,cardio and gold Smoker:  no  Health Maintenance: Pap: 12-11-14 Neg.   History of abnormal Pap:  Yes, patient thinks she had abnormal pap 2008 and possibly cryotherapy?? MMG: 03-04-16 dxd with bilateral breast cancer--Had Lt.mastectomy and Rt.lumpectomy.  [employer].  Colonoscopy: 08/2014 polyps with Dr.John Christoper Fabian in [place] due 08/2019. BMD:  03-04-16  Result :Osteopenia bilateral hips:GGA.   TDaP: PCP Gardasil:   no HIV: Will do today. Hep C :  Will do today. Screening Labs:  Hb today: PCP, Urine today: not done   reports that she quit smoking about 31 years ago. She has never used smokeless tobacco. She reports that she drinks about 4.8 - 6.0 oz of alcohol per week . She reports that she does not use drugs.  Past Medical History:  Diagnosis Date  . Arthritis   . Asthma    allergy induced  . Cancer ([place]) 2017   bil breast cancer  . Cervical polyp   . Detrusor instability   . Fibroid   .  GERD (gastroesophageal reflux disease)   . Heart burn   . Hemorrhoids   . History of radiation therapy    08/19/16- 09/29/16, Right Breast 50 Gy in 25 fractions, Right Breast boost 10 Gy in 5 fractions, Left Breast 50.4 Gy in 28 fractions  . Hypertonicity of bladder   . Joint pain   . LGSIL (low grade squamous intraepithelial dysplasia) 2001   colpo biopsy negative, negative paps since  . Osteopenia 02/2016   T score -1.3 FRAX 8.3%/0.7% stable from prior DEXA  . Urinary incontinence     Past Surgical History:  Procedure Laterality Date  . BREAST RECONSTRUCTION WITH PLACEMENT OF TISSUE EXPANDER AND FLEX HD (ACELLULAR HYDRATED DERMIS) Left 07/01/2016   Procedure: BREAST RECONSTRUCTION WITH PLACEMENT OF TISSUE EXPANDER AND  ALLODERM;  Surgeon: Irene Limbo, MD;  Location: [facility];  Service: Plastics;  Laterality: Left;  . INCISION AND DRAINAGE Left 06/02/2016   Procedure: DRAINAGE of left axilla seroma;  Surgeon: Rolm Bookbinder, MD;  Location: [facility];  Service: General;  Laterality: Left;  . NIPPLE SPARING MASTECTOMY Left 07/01/2016   Procedure: LEFT NIPPLE SPARING MASTECTOMY;  Surgeon: Rolm Bookbinder, MD;  Location: [facility];  Service: General;  Laterality: Left;  . RADIOACTIVE SEED GUIDED MASTECTOMY WITH AXILLARY SENTINEL LYMPH NODE BIOPSY Right 03/26/2016   Procedure: RIGHT BREAST LUMPECTOMY WITH RADIOACTIVE SEED AND SENTINEL LYMPH NODE BIOPSY;  Surgeon: Rolm Bookbinder, MD;  Location: [facility];  Service: General;  Laterality: Right;  . RADIOACTIVE SEED GUIDED MASTECTOMY WITH AXILLARY SENTINEL LYMPH NODE BIOPSY Left 05/20/2016   Procedure: LEFT BREAST RADIOACTIVE SEED (two seeds) GUIDED LUMPECTOMY WITH LEFT AXILLARY SENTINEL LYMPH NODE BIOPSY AND LEFT SEED TARGETED AXILLARY LYMPH NODE EXCISION;  Surgeon: Rolm Bookbinder, MD;  Location: [facility];  Service: General;  Laterality: Left;  . RE-EXCISION  OF BREAST CANCER,SUPERIOR MARGINS Right 05/20/2016   Procedure: RE-EXCISION OF RIGHT BREAST MARGIN;  Surgeon: Rolm Bookbinder, MD;  Location: [facility];  Service: General;  Laterality: Right;  . RE-EXCISION OF BREAST CANCER,SUPERIOR MARGINS Left 06/02/2016   Procedure: RE-EXCISION OF BREAST CANCER,SUPERIOR MARGINS;  Surgeon: Rolm Bookbinder, MD;  Location: [facility];  Service: General;  Laterality: Left;    Current Outpatient Prescriptions  Medication Sig Dispense Refill  . albuterol (PROAIR HFA) 108 (90 Base) MCG/ACT inhaler Inhale 2 puffs into the lungs every 4 (four) hours as needed for wheezing or shortness of breath. 1 Inhaler 1  . Calcium Carbonate-Vitamin D (CALCIUM + D PO) Take by mouth.    . cetirizine (ZYRTEC) 10 MG tablet Take 10 mg by mouth.    . Cholecalciferol (VITAMIN D) 2000 units tablet Take 1 tablet (2,000 Units total) by mouth daily.    Marland Kitchen docusate sodium (COLACE) 50 MG capsule Take 50 mg by mouth.    Cristy Friedlander HFA 110 MCG/ACT inhaler INHALE 1 PUFF BY MOUTH INTO THE LUNGS TWICE DAILY 12 g 0  . fluticasone (FLONASE) 50 MCG/ACT nasal spray Place 1 spray into both nostrils daily.    . hydrocortisone (ANUSOL-HC) 2.5 % rectal cream Place 1 application rectally as needed for hemorrhoids or itching.    . hydrocortisone (ANUSOL-HC) 25 MG suppository Place 25 mg rectally 2 (two) times daily as needed for hemorrhoids or itching.    Marland Kitchen ibuprofen (ADVIL,MOTRIN) 200 MG tablet Take 200 mg by mouth every 6 (six) hours as needed.    Marland Kitchen omega-3 acid ethyl esters (LOVAZA) 1 g capsule Take by mouth 2 (two) times daily.    Marland Kitchen PRILOSEC OTC 20 MG tablet Reported on 12/14/2015    . psyllium (METAMUCIL) 58.6 % packet Take 1 packet by mouth daily.    . tamoxifen (NOLVADEX) 20 MG tablet Take 20 mg by mouth daily.    . Turmeric 450 MG CAPS Take by mouth.    . valACYclovir (VALTREX) 1000 MG tablet Take 1,000 mg by mouth at bedtime as needed.    . venlafaxine XR  (EFFEXOR-XR) 75 MG 24 hr capsule Take 1 capsule (75 mg total) by mouth daily with breakfast. 90 capsule 12   No current facility-administered medications for this visit.     Family History  Problem Relation Age of Onset  . Hypertension Mother   . Heart disease Mother   . Lung disease Mother   . Hypertension Father   . Cancer Father        colon  . Heart disease Father   . Lung cancer Father   . Breast cancer Paternal Grandmother        Age 66's  . Hypertension Brother   . Hyperlipidemia Brother   . Diabetes Maternal Grandfather     ROS:  Pertinent items are noted in HPI.  Otherwise, a comprehensive ROS was negative.  Exam:   BP 110/64 (BP Location: Right Arm, Patient Position: Sitting, Cuff Size: Normal)   Pulse 80   Resp  14   Ht 5' 3.5" (1.613 m)   Wt 149 lb 9.6 oz (67.9 kg)   LMP 07/28/1996 (Approximate)   BMI 26.08 kg/m     General appearance: alert, cooperative and appears stated age Head: Normocephalic, without obvious abnormality, atraumatic Neck: no adenopathy, supple, symmetrical, trachea midline and thyroid normal to inspection and palpation Lungs: clear to auscultation bilaterally Heart: regular rate and rhythm Abdomen: soft, non-tender; no masses, no organomegaly Extremities: extremities normal, atraumatic, no cyanosis or edema Skin: Skin color, texture, turgor normal. No rashes or lesions.  No abnormal inguinal nodes palpated Neurologic: Grossly normal  Pelvic: External genitalia:  no lesions              Urethra:  normal appearing urethra with no masses, tenderness or lesions              Bartholins and Skenes: normal                 Vagina: normal appearing vagina with normal color and discharge, no lesions              Cervix:  No lesions.  Sessile polyp. Bimanual Exam:  Uterus:  normal size, contour, position, consistency, mobility, non-tender              Adnexa: no mass, fullness, tenderness         Chaperone was present for exam.  Assessment:    Hx bilateral breast cancer.  On Tamoxifen.  Menopausal symptoms.  On Effexor. Osteopenia.  Overactive bladder.  Cervical polyp that is sessile. FH of breast and colon cancer.  Desire for HIV and hep C testing.  Oral HSV. Recent shingles.   Plan:   We discussed breast reconstruction.   She understands the importance of calling for any vaginal bleeding due to the small risk of endometrial cancer from Tamoxifen use.  OK to continue Effexor.   We reviewed Gabapentin as an alternative to Effexor.  Calcium, vit D, and weight bearing exercise with BMD every 2 years. Bladder irritants discussed to control overactive bladder.  Will do pap at annual exam in July 2018.  Rare risk of cancer in cervical polyp discussed.  We reviewed he possibility of genetic testing if desired.  Currently declined. Will do hep C and HIV testing today.   Follow up for annual exam in July 2018.     ___45____ minutes face to face time of which over 50% was spent in counseling.    After visit summary provided.

## 2016-12-11 LAB — HEPATITIS C ANTIBODY: HCV AB: NEGATIVE

## 2016-12-11 LAB — HIV ANTIBODY (ROUTINE TESTING W REFLEX): HIV 1&2 Ab, 4th Generation: NONREACTIVE

## 2016-12-25 ENCOUNTER — Encounter: Payer: Self-pay | Admitting: Allergy & Immunology

## 2016-12-25 ENCOUNTER — Ambulatory Visit (INDEPENDENT_AMBULATORY_CARE_PROVIDER_SITE_OTHER): Payer: BC Managed Care – PPO | Admitting: Allergy & Immunology

## 2016-12-25 ENCOUNTER — Telehealth: Payer: Self-pay

## 2016-12-25 VITALS — BP 108/66 | HR 64 | Resp 16

## 2016-12-25 DIAGNOSIS — J302 Other seasonal allergic rhinitis: Secondary | ICD-10-CM

## 2016-12-25 DIAGNOSIS — J4530 Mild persistent asthma, uncomplicated: Secondary | ICD-10-CM

## 2016-12-25 MED ORDER — BECLOMETHASONE DIPROPIONATE 40 MCG/ACT IN AERS: 2.0000 | INHALATION_SPRAY | Freq: Two times a day (BID) | RESPIRATORY_TRACT | 2 refills | Status: DC

## 2016-12-25 MED ORDER — AZELASTINE-FLUTICASONE 137-50 MCG/ACT NA SUSP: NASAL | 2 refills | Status: DC

## 2016-12-25 MED ORDER — ALBUTEROL SULFATE HFA 108 (90 BASE) MCG/ACT IN AERS: 2.0000 | INHALATION_SPRAY | RESPIRATORY_TRACT | 1 refills | Status: AC | PRN

## 2016-12-25 NOTE — Telephone Encounter (Signed)
Qvar redihaler 40 mcg replaced with Flovent per office policy.

## 2016-12-25 NOTE — Progress Notes (Signed)
FOLLOW UP  Date of Service/Encounter:  12/25/16   Assessment:    Mild persistent asthma, uncomplicated  Seasonal allergic rhinitis - not well controlled   Plan/Recommendations:   1. Mild persistent asthma, uncomplicated - Lung testing looked great today.  - We will change you back to Qvar.  - Daily controller medication(s): Qvar 26mcg two puffs twice daily - Rescue medications: albuterol 4 puffs every 4-6 hours as needed - Changes during respiratory infections or worsening symptoms: increase Qvar 59mcg to 4 puffs twice daily for TWO WEEKS. - Asthma control goals:  * Full participation in all desired activities (may need albuterol before activity) * Albuterol use two time or less a week on average (not counting use with activity) * Cough interfering with sleep two time or less a month * Oral steroids no more than once a year * No hospitalizations  2. Seasonal allergic rhinitis - Stop the Flonase and start Dymista 2 sprayts per nostril 1-2 times daily.  - Start prednisone pack provided. - Continue with cetirizine 10mg  daily and nasal saline rinses 1-2 times daily. - She may have some underlying sinus disease, but her sinus pressure has improved and she is on a sulfa antibiotic which would provide some antibacterial coverage for her sinuses anyway.   3. Return in about 6 months (around 06/26/2017).   Subjective:   Sabrina Tapia is a 66 y.o. female presenting today for follow up of  Chief Complaint  Patient presents with  . Cough  . Nasal Congestion    Sabrina Tapia has a history of the following: Patient Active Problem List   Diagnosis Date Noted  . Malignant neoplasm of overlapping sites of left breast in female, estrogen receptor positive ([place]) 10/27/2016  . Sinusitis 09/01/2016  . Hyponatremia 09/01/2016  . Malignant neoplasm of upper-outer quadrant of right breast in female, estrogen receptor positive ([place]) 04/19/2016  . Acid reflux 11/20/2015  .  Bunion of great toe of right foot 04/04/2015  . Hemorrhoid 11/23/2014  . Anal skin tag 11/23/2014  . Anal burning 11/23/2014  . Plantar fasciitis, right 05/11/2014  . Retrocalcaneal bursitis 05/11/2014  . Pain in joint, ankle and foot 05/11/2014  . Heart burn   . Joint pain   . Abnormal Pap smear   . Osteopenia     History obtained from: chart review and patient.  Sabrina Tapia was referred by Josetta Huddle, MD.     Sabrina Tapia is a 66 y.o. female presenting for a follow up visit. She was last seen in May 2017 by Dr. Ishmael Holter, who has since left the practice. At that time, she was continued on Qvar 80 g 1 inhalation twice daily. She was also continued on fluticasone and cetirizine. Allergy shots were again discussed. She was asked to follow up in 4-6 months.  Since last visit, she has mostly done well. [name]'s asthma has been well controlled. She has not required rescue medication, experienced nocturnal awakenings due to lower respiratory symptoms, nor have activities of daily living been limited until recently. She did have some trouble with the emergence of the tree pollen this past spring. It did improve and then started back up again recently. With the change in the Qvar device, her insurance stopped covering it. She was automatically changed to Flovent. She does have a spacer and has been using the Flovent 2 puffs in the morning and 2 puffs at night. However, she felt that the Flovent was not working well. She had some Qvar  left at home, and restarted this 4 days ago. She has one of the old devices and uses 2 puffs in the morning and 2 puffs at night. Her cough has continued despite this, but she does feel better on the Qvar. Over the last 2-4 weeks she has been using her rescue inhaler on a fairly regular basis.   For her allergic rhinitis, she is on Flonase 2 sprays per nostril daily as well as Zyrtec. Even with this, she continues to have breakthrough symptoms. She has never tried any  nasal antihistamines. She is not interested and allergy shots today. Of note, she was diagnosed with bilateral breast cancer within the last year via a routine mammography. She underwent radiation on both sides for 30 days, Monday through Friday area she was also taking a chemotherapy drug orally during this time. She will have her fifth surgery this fall for breast replacement. She is unsure whether the radiation caused any lung damage. She was a previous smoker and smoked for 25 years, quitting almost 30 years ago.  Otherwise, there have been no changes to her past medical history, surgical history, family history, or social history. She is currently on Bactrim for a UTI and has two days left of this.     Review of Systems: a 14-point review of systems is pertinent for what is mentioned in HPI.  Otherwise, all other systems were negative. Constitutional: negative other than that listed in the HPI Eyes: negative other than that listed in the HPI Ears, nose, mouth, throat, and face: negative other than that listed in the HPI Respiratory: negative other than that listed in the HPI Cardiovascular: negative other than that listed in the HPI Gastrointestinal: negative other than that listed in the HPI Genitourinary: negative other than that listed in the HPI Integument: negative other than that listed in the HPI Hematologic: negative other than that listed in the HPI Musculoskeletal: negative other than that listed in the HPI Neurological: negative other than that listed in the HPI Allergy/Immunologic: negative other than that listed in the HPI    Objective:   Blood pressure 108/66, pulse 64, resp. rate 16, last menstrual period 07/28/1996. There is no height or weight on file to calculate BMI.   Physical Exam:  General: Alert, interactive, in no acute distress. Pleasant female. Very talkative.  Eyes: No conjunctival injection present on the right, No conjunctival injection present on the  left, PERRL bilaterally, No discharge on the right, No discharge on the left and No Horner-Trantas dots present Ears: Right TM pearly gray with normal light reflex, Left TM pearly gray with normal light reflex, Right TM intact without perforation and Left TM intact without perforation.  Nose/Throat: External nose within normal limits and septum midline, turbinates markedly edematous and pale with clear discharge, post-pharynx erythematous with cobblestoning in the posterior oropharynx. Tonsils 2+ without exudates Neck: Supple without thyromegaly. Lungs: Clear to auscultation without wheezing, rhonchi or rales. No increased work of breathing. CV: Normal S1/S2, no murmurs. Capillary refill <2 seconds.  Skin: Warm and dry, without lesions or rashes. Neuro:   Grossly intact. No focal deficits appreciated. Responsive to questions.   Diagnostic studies:   Spirometry: results normal (FEV1: 2.16/92%, FVC: 2.67/91%, FEV1/FVC: 80%).    Spirometry consistent with normal pattern.    Allergy Studies: none     Salvatore Marvel, MD [facility]

## 2016-12-25 NOTE — Addendum Note (Signed)
Addended by: Gara Kroner L on: 12/25/2016 02:59 PM   Modules accepted: Orders

## 2016-12-25 NOTE — Patient Instructions (Addendum)
1. Mild persistent asthma, uncomplicated - Lung testing looked great today.  - We will change you back to Qvar.  - Daily controller medication(s): Qvar 71mcg two puffs twice daily - Rescue medications: albuterol 4 puffs every 4-6 hours as needed - Changes during respiratory infections or worsening symptoms: increase Qvar 62mcg to 4 puffs twice daily for TWO WEEKS. - Asthma control goals:  * Full participation in all desired activities (may need albuterol before activity) * Albuterol use two time or less a week on average (not counting use with activity) * Cough interfering with sleep two time or less a month * Oral steroids no more than once a year * No hospitalizations  2. Seasonal allergic rhinitis - Stop the Flonase and start Dymista 2 sprayts per nostril 1-2 times daily.  - Start prednisone pack provided. - Continue with cetirizine 10mg  daily and nasal saline rinses 1-2 times daily.  3. Return in about 6 months (around 06/26/2017).  Please inform us of any Emergency Department visits, hospitalizations, or changes in symptoms. Call us before going to the ED for breathing or allergy symptoms since we might be able to fit you in for a sick visit. Feel free to contact us anytime with any questions, problems, or concerns.  It was a pleasure to meet you today! Happy spring!   Websites that have reliable patient information: 1. American Academy of Asthma, Allergy, and Immunology: www.aaaai.org 2. Food Allergy Research and Education (FARE): foodallergy.org 3. Mothers of Asthmatics: http://www.asthmacommunitynetwork.org 4. American College of Allergy, Asthma, and Immunology: www.acaai.org

## 2017-01-09 ENCOUNTER — Other Ambulatory Visit: Payer: Self-pay

## 2017-01-09 MED ORDER — FLUTICASONE PROPIONATE HFA 44 MCG/ACT IN AERO: 2.0000 | INHALATION_SPRAY | Freq: Two times a day (BID) | RESPIRATORY_TRACT | 5 refills | Status: DC

## 2017-01-09 NOTE — Telephone Encounter (Signed)
Qvar 40 redihaler is not covered. Sent in Flovent 44 mcg per standing orders.

## 2017-01-21 ENCOUNTER — Telehealth: Payer: Self-pay | Admitting: Allergy & Immunology

## 2017-01-21 MED ORDER — FLUTICASONE FUROATE-VILANTEROL 100-25 MCG/INH IN AEPB
1.0000 | INHALATION_SPRAY | Freq: Every day | RESPIRATORY_TRACT | 3 refills | Status: DC
Start: 2017-01-21 — End: 2017-04-10

## 2017-01-21 NOTE — Telephone Encounter (Signed)
Called patient. I informed patient that we sent in a Breo 100/25 to take 1 puff once daily. I also informed patient to keep [place] updated on how the Memory Dance is working. I sent script to [place] on [address].

## 2017-01-21 NOTE — Telephone Encounter (Signed)
Patient saw Dr. Ernst Bowler on 12-25-16. She said she was put on medicine and does not believe it is working and would like to know what can be done.

## 2017-01-21 NOTE — Telephone Encounter (Signed)
Called patient tofind out what medication wasn't working . Patient informed me that she was using the Qvar redihaler and it caused thrush despite rinsing her mouth out after every use; that was from the sample that was given to her. Insurance will not cover the Qvar. Then we put her on Flovent and patient states that it is not working for her. She has been using her rescue more frequently.   Please advise

## 2017-01-21 NOTE — Telephone Encounter (Signed)
Let's change to Breo 100/25 one puff once daily. Please send in the script.   Salvatore Marvel, MD [facility]

## 2017-01-30 ENCOUNTER — Other Ambulatory Visit (HOSPITAL_COMMUNITY)
Admission: RE | Admit: 2017-01-30 | Discharge: 2017-01-30 | Disposition: A | Discharge status: Home / Self Care | Payer: Medicare Other | Source: Ambulatory Visit | Attending: Obstetrics and Gynecology | Admitting: Obstetrics and Gynecology

## 2017-01-30 ENCOUNTER — Encounter: Payer: Self-pay | Admitting: Obstetrics and Gynecology

## 2017-01-30 ENCOUNTER — Ambulatory Visit (INDEPENDENT_AMBULATORY_CARE_PROVIDER_SITE_OTHER): Payer: Medicare Other | Admitting: Obstetrics and Gynecology

## 2017-01-30 VITALS — BP 130/80 | HR 68 | Resp 16 | Ht 63.5 in | Wt 149.0 lb

## 2017-01-30 DIAGNOSIS — Z01419 Encounter for gynecological examination (general) (routine) without abnormal findings: Secondary | ICD-10-CM | POA: Insufficient documentation

## 2017-01-30 NOTE — Patient Instructions (Signed)

## 2017-01-30 NOTE — Progress Notes (Signed)
66 y.o. G0P0 Single Caucasian female here for annual exam.    Had a urinary tract infection.  Self treated with an antibiotic she had at home.  Follow up urine testing was normal.   Noting increased urgency.  Used Vesicare in the past.  Had constipation.  Tried Myrbetriq and this did not work as well.    Can leak with sneeze and cough.   Hx hemorrhoids.  Has a sessile cervical polyp noted at her last visit.   Having breast reconstruction done in August.   Needs her dx mammogram for August - Sees. Dr. Christene Slates.  Oncologist is Dr. Jana Hakim.  PCP: Josetta Huddle  Patient's last menstrual period was 07/28/1996 (approximate).           Sexually active: No.  The current method of family planning is post menopausal status.    Exercising: Yes.    pilates, yoga, cardio Smoker:  no  Health Maintenance: Pap:  12/11/14 Neg  History of abnormal Pap:  Yes, ~2008  MMG:  03-04-16 bilateral breast cancer--Had Lt. mastectomy and Rt. lumpectomy. Colonoscopy:  08/2014 polyps. F/u 5 years  BMD:   02/2016  Result  Osteopenia  TDaP:  Current with PCP HIV: 12/10/16 Neg Hep C: 12/10/16 Neg  Screening Labs: PCP   reports that she quit smoking about 32 years ago. She has never used smokeless tobacco. She reports that she drinks about 4.8 - 6.0 oz of alcohol per week . She reports that she does not use drugs.  Past Medical History:  Diagnosis Date  . Arthritis   . Asthma    allergy induced  . Cancer ([place]) 2017   bil breast cancer  . Cervical polyp   . Detrusor instability   . Fibroid   . GERD (gastroesophageal reflux disease)   . Heart burn   . Hemorrhoids   . History of radiation therapy    08/19/16- 09/29/16, Right Breast 50 Gy in 25 fractions, Right Breast boost 10 Gy in 5 fractions, Left Breast 50.4 Gy in 28 fractions  . Hypertonicity of bladder   . Joint pain   . LGSIL (low grade squamous intraepithelial dysplasia) 2001   colpo biopsy negative, negative paps since  . Osteopenia  02/2016   T score -1.3 FRAX 8.3%/0.7% stable from prior DEXA  . Urinary incontinence     Past Surgical History:  Procedure Laterality Date  . BREAST RECONSTRUCTION WITH PLACEMENT OF TISSUE EXPANDER AND FLEX HD (ACELLULAR HYDRATED DERMIS) Left 07/01/2016   Procedure: BREAST RECONSTRUCTION WITH PLACEMENT OF TISSUE EXPANDER AND  ALLODERM;  Surgeon: Irene Limbo, MD;  Location: [facility];  Service: Plastics;  Laterality: Left;  . INCISION AND DRAINAGE Left 06/02/2016   Procedure: DRAINAGE of left axilla seroma;  Surgeon: Rolm Bookbinder, MD;  Location: [facility];  Service: General;  Laterality: Left;  . NIPPLE SPARING MASTECTOMY Left 07/01/2016   Procedure: LEFT NIPPLE SPARING MASTECTOMY;  Surgeon: Rolm Bookbinder, MD;  Location: [facility];  Service: General;  Laterality: Left;  . RADIOACTIVE SEED GUIDED MASTECTOMY WITH AXILLARY SENTINEL LYMPH NODE BIOPSY Right 03/26/2016   Procedure: RIGHT BREAST LUMPECTOMY WITH RADIOACTIVE SEED AND SENTINEL LYMPH NODE BIOPSY;  Surgeon: Rolm Bookbinder, MD;  Location: [facility];  Service: General;  Laterality: Right;  . RADIOACTIVE SEED GUIDED MASTECTOMY WITH AXILLARY SENTINEL LYMPH NODE BIOPSY Left 05/20/2016   Procedure: LEFT BREAST RADIOACTIVE SEED (two seeds) GUIDED LUMPECTOMY WITH LEFT AXILLARY SENTINEL LYMPH NODE BIOPSY AND LEFT SEED  TARGETED AXILLARY LYMPH NODE EXCISION;  Surgeon: Rolm Bookbinder, MD;  Location: [facility];  Service: General;  Laterality: Left;  . RE-EXCISION OF BREAST CANCER,SUPERIOR MARGINS Right 05/20/2016   Procedure: RE-EXCISION OF RIGHT BREAST MARGIN;  Surgeon: Rolm Bookbinder, MD;  Location: [facility];  Service: General;  Laterality: Right;  . RE-EXCISION OF BREAST CANCER,SUPERIOR MARGINS Left 06/02/2016   Procedure: RE-EXCISION OF BREAST CANCER,SUPERIOR MARGINS;  Surgeon: Rolm Bookbinder, MD;  Location: [facility];   Service: General;  Laterality: Left;    Current Outpatient Prescriptions  Medication Sig Dispense Refill  . albuterol (PROAIR HFA) 108 (90 Base) MCG/ACT inhaler Inhale 2 puffs into the lungs every 4 (four) hours as needed for wheezing or shortness of breath. 1 Inhaler 1  . Azelastine-Fluticasone 137-50 MCG/ACT SUSP 2 sprays in each nostril 1-2 times daily. 23 g 2  . Calcium Carbonate-Vitamin D (CALCIUM + D PO) Take by mouth.    . cetirizine (ZYRTEC) 10 MG tablet Take 10 mg by mouth.    . Cholecalciferol (VITAMIN D) 2000 units tablet Take 1 tablet (2,000 Units total) by mouth daily.    Marland Kitchen docusate sodium (COLACE) 50 MG capsule Take 50 mg by mouth.    . fluticasone furoate-vilanterol (BREO ELLIPTA) 100-25 MCG/INH AEPB Inhale 1 puff into the lungs daily. 60 each 3  . hydrocortisone (ANUSOL-HC) 2.5 % rectal cream Place 1 application rectally as needed for hemorrhoids or itching.    . hydrocortisone (ANUSOL-HC) 25 MG suppository Place 25 mg rectally 2 (two) times daily as needed for hemorrhoids or itching.    Marland Kitchen ibuprofen (ADVIL,MOTRIN) 200 MG tablet Take 200 mg by mouth every 6 (six) hours as needed.    Marland Kitchen omega-3 acid ethyl esters (LOVAZA) 1 g capsule Take by mouth 2 (two) times daily.    Marland Kitchen PRILOSEC OTC 20 MG tablet Reported on 12/14/2015    . psyllium (METAMUCIL) 58.6 % packet Take 1 packet by mouth daily.    . tamoxifen (NOLVADEX) 20 MG tablet Take 20 mg by mouth daily.    . Turmeric 450 MG CAPS Take by mouth.    . valACYclovir (VALTREX) 1000 MG tablet Take 1,000 mg by mouth at bedtime as needed.    . venlafaxine XR (EFFEXOR-XR) 75 MG 24 hr capsule Take 1 capsule (75 mg total) by mouth daily with breakfast. 90 capsule 12   No current facility-administered medications for this visit.     Family History  Problem Relation Age of Onset  . Hypertension Mother   . Heart disease Mother   . Lung disease Mother   . Hypertension Father   . Cancer Father        colon  . Heart disease Father   .  Lung cancer Father   . Breast cancer Paternal Grandmother        Age 34's  . Hypertension Brother   . Hyperlipidemia Brother   . Diabetes Maternal Grandfather     ROS:  Pertinent items are noted in HPI.  Otherwise, a comprehensive ROS was negative.  Exam:   BP 130/80 (BP Location: Right Arm, Patient Position: Sitting, Cuff Size: Normal)   Pulse 68   Resp 16   Ht 5' 3.5" (1.613 m)   Wt 149 lb (67.6 kg)   LMP 07/28/1996 (Approximate)   BMI 25.98 kg/m     General appearance: alert, cooperative and appears stated age Head: Normocephalic, without obvious abnormality, atraumatic Neck: no adenopathy, supple, symmetrical, trachea midline and thyroid  normal to inspection and palpation Lungs: clear to auscultation bilaterally Breasts: left breast with implant, fixed and firm.  Right breast with normal appearance, no masses or tenderness, No nipple retraction or dimpling, No nipple discharge or bleeding, No axillary or supraclavicular adenopathy Heart: regular rate and rhythm Abdomen: soft, non-tender; no masses, no organomegaly Extremities: extremities normal, atraumatic, no cyanosis or edema Skin: Skin color, texture, turgor normal. No rashes or lesions Lymph nodes: Cervical, supraclavicular, and axillary nodes normal. No abnormal inguinal nodes palpated Neurologic: Grossly normal  Pelvic: External genitalia:  no lesions              Urethra:  normal appearing urethra with no masses, tenderness or lesions              Bartholins and Skenes: normal                 Vagina: normal appearing vagina with normal color and discharge, no lesions              Cervix: no lesions.  No polyp seen today.              Pap taken: Yes.   Bimanual Exam:  Uterus:  normal size, contour, position, consistency, mobility, non-tender              Adnexa: no mass, fullness, tenderness              Rectal exam: Yes.  .  Confirms.              Anus:  normal sphincter tone, no lesions  Chaperone was present  for exam.  Assessment:   Well woman visit with normal exam. Bilateral breast cancer.  Status post left mastectomy.  On tamoxifen.  Overactive bladder.  Declines medication.  Not candidate for Myrbetriq due to Tamoxifen use.  Menopausal symptoms.  On Effexor.  Osteopenia.  FH breast and colon cancer.  Hx oral HSV.  Plan: Will have Dr. Jana Hakim order her next mammogram as he is following her breast cancer and upcoming reconstruction.  Surgery scheduled for the Fall 2018.  Pap and HR HPV as above. Guidelines for Calcium, Vitamin D, regular exercise program including cardiovascular and weight bearing exercise. BMD in 2019. Call for any vaginal spotting or bleeding.   She understands the importance of this.  Follow up annually and prn.   After visit summary provided.

## 2017-02-03 LAB — CYTOLOGY - PAP
Diagnosis: NEGATIVE
HPV: NOT DETECTED

## 2017-02-04 ENCOUNTER — Ambulatory Visit: Payer: BC Managed Care – PPO | Admitting: Obstetrics and Gynecology

## 2017-02-06 ENCOUNTER — Telehealth: Payer: Self-pay | Admitting: Obstetrics and Gynecology

## 2017-02-06 NOTE — Telephone Encounter (Signed)
Patient called and said she thinks she may have a urinary tract infection and she is getting ready to fly out of town at 4:30. She says she has some Cipro at home but wants to speak with the nurse first.   Pharmacy on file confirmed for now, if needed.

## 2017-02-06 NOTE — Telephone Encounter (Signed)
Spoke with patient. Reports she had one cup of water this morning, went to Pilates and has been experiencing increased urinary frequency throughout the day with burning discomfort at the end of voiding. Reports urine is "a touch cloudier than usual". Denies fever, chills, flank/lower back pain. Recommended OV for further evaluation, patient declined. Patient states she is leaving on a plane to go out of town in a few minutes. Patient asking if OK to self treat with "Cipro" that she has been prescribed previously. RN recommended OV for evaluation to ensure appropriate treatment,  recommended patient to seek care at local Urgent Care/ER where she will be traveling to. Advised can take OTC Azo for symptom relief in meantime, this will not treat UTI, would still need further evaluation. Patient then states she plans to take Cipro, this has helped her in the past. Advised patient if symptoms do not resolve, return call office to schedule OV. Advised patient Dr. Quincy Simmonds would review, will return call with any additional recommendations. Patient verbalizes understanding.  Routing to provider for final review. Patient is agreeable to disposition. Will close encounter.

## 2017-02-07 ENCOUNTER — Encounter: Payer: Self-pay | Admitting: Allergy & Immunology

## 2017-02-09 ENCOUNTER — Telehealth: Payer: Self-pay | Admitting: Allergy & Immunology

## 2017-02-09 ENCOUNTER — Other Ambulatory Visit: Payer: Self-pay | Admitting: Allergy & Immunology

## 2017-02-09 NOTE — Telephone Encounter (Signed)
Patient is calling about thrush Patient has had it twice now - due to her inhaler?? Patient was on QVAR - got thrust Went to seen an NP, gave her meds dont know if the thrush fully went away Patient is now on BREO - loves it But has had the thrush come back  What can she do??

## 2017-02-09 NOTE — Telephone Encounter (Signed)
Dr Ernst Bowler please advise also sent a patient advise encounter

## 2017-02-09 NOTE — Telephone Encounter (Signed)
I replied to her message. We need to work on her asthma regimen, so I told her that we would be calling tomorrow to schedule an appointment with me in the next couple of weeks.   Salvatore Marvel, MD [facility] [place]

## 2017-02-10 NOTE — Telephone Encounter (Signed)
Patient has been scheduled for this thursday

## 2017-02-11 DIAGNOSIS — Z923 Personal history of irradiation: Secondary | ICD-10-CM | POA: Insufficient documentation

## 2017-02-12 ENCOUNTER — Ambulatory Visit (INDEPENDENT_AMBULATORY_CARE_PROVIDER_SITE_OTHER): Payer: Medicare Other | Admitting: Allergy & Immunology

## 2017-02-12 ENCOUNTER — Encounter: Payer: Self-pay | Admitting: Allergy & Immunology

## 2017-02-12 VITALS — BP 110/80 | HR 78 | Temp 98.0°F | Resp 16

## 2017-02-12 DIAGNOSIS — J302 Other seasonal allergic rhinitis: Secondary | ICD-10-CM

## 2017-02-12 DIAGNOSIS — Z23 Encounter for immunization: Secondary | ICD-10-CM

## 2017-02-12 DIAGNOSIS — B370 Candidal stomatitis: Secondary | ICD-10-CM

## 2017-02-12 DIAGNOSIS — J4530 Mild persistent asthma, uncomplicated: Secondary | ICD-10-CM | POA: Insufficient documentation

## 2017-02-12 MED ORDER — AZELASTINE-FLUTICASONE 137-50 MCG/ACT NA SUSP: NASAL | 1 refills | Status: DC

## 2017-02-12 MED ORDER — FLUCONAZOLE 100 MG PO TABS: 100.0000 mg | ORAL_TABLET | Freq: Every day | ORAL | 4 refills | Status: AC

## 2017-02-12 NOTE — Patient Instructions (Addendum)
1. Mild persistent asthma, uncomplicated - Lung testing looked great today.  - We will change you to Symbicort 160/4.5 two puffs twice daily. - Daily controller medication(s): Symbicort 160/4.5 two puffs twice daily with spacer - Rescue medications: albuterol 4 puffs every 4-6 hours as needed - Asthma control goals:  * Full participation in all desired activities (may need albuterol before activity) * Albuterol use two time or less a week on average (not counting use with activity) * Cough interfering with sleep two time or less a month * Oral steroids no more than once a year * No hospitalizations  2. Seasonal allergic rhinitis - Continue with Dymista 2 sprays per nostril 1-2 times daily.  - Continue with cetirizine 10mg  daily and nasal saline rinses 1-2 times daily.  3. Return in about 3 months (around 05/15/2017).  Please inform us of any Emergency Department visits, hospitalizations, or changes in symptoms. Call us before going to the ED for breathing or allergy symptoms since we might be able to fit you in for a sick visit. Feel free to contact us anytime with any questions, problems, or concerns.  It was a pleasure to see you again today! Have a great summer!    Websites that have reliable patient information: 1. American Academy of Asthma, Allergy, and Immunology: www.aaaai.org 2. Food Allergy Research and Education (FARE): foodallergy.org 3. Mothers of Asthmatics: http://www.asthmacommunitynetwork.org 4. American College of Allergy, Asthma, and Immunology: www.acaai.org

## 2017-02-12 NOTE — Progress Notes (Signed)
FOLLOW UP  Date of Service/Encounter:  02/12/17   Assessment:   Mild persistent asthma - not well controlled   Seasonal allergic rhinitis  Need for prophylactic vaccination against Streptococcus pneumoniae   Oral thrush - partially treated    Asthma Reportables:  Severity: mild persistent  Risk: low Control: not well controlled    Plan/Recommendations:   1. Mild persistent asthma, uncomplicated - Lung testing looked great today.  - We will change you to Symbicort 160/4.5 two puffs twice daily. - Daily controller medication(s): Symbicort 160/4.5 two puffs twice daily with spacer - Rescue medications: albuterol 4 puffs every 4-6 hours as needed - Asthma control goals:  * Full participation in all desired activities (may need albuterol before activity) * Albuterol use two time or less a week on average (not counting use with activity) * Cough interfering with sleep two time or less a month * Oral steroids no more than once a year * No hospitalizations  2. Seasonal allergic rhinitis - Continue with Dymista 2 sprays per nostril 1-2 times daily.  - Continue with cetirizine 10mg  daily and nasal saline rinses 1-2 times daily.  3. Oral candidiasis - partially treated with nystatin - We will send in a prescription for fluconazole 100mg  x 1 to complete the treatment. - This all seems to be triggered by ICS and with the lack of adequate treatment.  - She does have a history of recent recurrent UTIs, however the remainder of her infectious history is rather unremarkable.  4. Need for prophylactic vaccination against Streptococcus pneumoniae - We administered Pneumovax today since she will be traveling before she was scheduled to get it at her next regular appointment with her PCP.  - She has a history of contracting pneumonia during her visits to [place], therefore hopefully with this prevent this from happening again.   5. Return in about 3 months (around  05/15/2017).   Subjective:   Melvina CHASE KNEBEL is a 66 y.o. female presenting today for follow up of  Chief Complaint  Patient presents with  . Asthma    [place] SHARRI LOYA has a history of the following: Patient Active Problem List   Diagnosis Date Noted  . Mild persistent asthma, uncomplicated 16/04/9603  . Other seasonal allergic rhinitis 02/12/2017  . Malignant neoplasm of overlapping sites of left breast in female, estrogen receptor positive ([place]) 10/27/2016  . Sinusitis 09/01/2016  . Hyponatremia 09/01/2016  . Malignant neoplasm of upper-outer quadrant of right breast in female, estrogen receptor positive ([place]) 04/19/2016  . Acid reflux 11/20/2015  . Bunion of great toe of right foot 04/04/2015  . Hemorrhoid 11/23/2014  . Anal skin tag 11/23/2014  . Anal burning 11/23/2014  . Plantar fasciitis, right 05/11/2014  . Retrocalcaneal bursitis 05/11/2014  . Pain in joint, ankle and foot 05/11/2014  . Heart burn   . Joint pain   . Abnormal Pap smear   . Osteopenia     History obtained from: chart review and patient.  Jesalyn Jose Persia was referred by Josetta Huddle, MD.     [place] is a 66 y.o. female presenting for a follow up visit. She was last seen in May 2018. At that time, her lung testing looked great. However, she was having recurrent thrush with the Flovent. She had previously done very well on the Qvar, so we filled out a prior authorization to get this approved once again. For her allergic rhinitis, we started on this to 2 sprays per nostril 1-2 times daily.  We also gave her prednisone, to help control her rhinitis symptoms. We continued her on cetirizine 10 mg daily along with nasal saline rinses.  Since the last visit, she has continued to have problems with recurrent thrush with her inhalers - specifically Qvar was well as Breo. She loved the [facility] and felt that it was providing excellent control of her symptoms, however she started to develop thrush around 17 days  after starting it. She wet to an Urgent Care and was treated with nystatin but but she continues to have a couple of lesions in her mouth. Since the [facility] was off, she restarted the Flovent instead, which resulted in coughing at night.   Overall, she felt that she did not do well with the dry powder inhalers. She truly did well with the old version of Qvar, but with the change in the device, she has had increased incidence of thrush. We changed her to [facility] in the interim, which provided good relief of her symptoms but then resulted in thrush. The Flovent and other MDI type inhalers do not seem to cause any problems because of the spacer, per the patient. However, her symptoms are not well controlled with this regimen.   Allergic rhinitis is well controlled with the Dymista, which she is quite happy with. She is also on cetirizine 10mg  daily as well as nasal saline rinses 1-2 times daily as needed. Aside from the oral thrush, which is a recent occurrence, she does not have any problems with recurrent infections. She does have a couple of episodes of UTIs. She also had an episode of pneumonia in 2011 when she last went to [place]. She is heading to [place] again in a few days. She has not received her Pneumovax yet and is scheduled to get it at her next PCP appointment in late August.   Otherwise, there have been no changes to her past medical history, surgical history, family history, or social history.    Review of Systems: a 14-point review of systems is pertinent for what is mentioned in HPI.  Otherwise, all other systems were negative. Constitutional: negative other than that listed in the HPI Eyes: negative other than that listed in the HPI Ears, nose, mouth, throat, and face: negative other than that listed in the HPI Respiratory: negative other than that listed in the HPI Cardiovascular: negative other than that listed in the HPI Gastrointestinal: negative other than that listed in the  HPI Genitourinary: negative other than that listed in the HPI Integument: negative other than that listed in the HPI Hematologic: negative other than that listed in the HPI Musculoskeletal: negative other than that listed in the HPI Neurological: negative other than that listed in the HPI Allergy/Immunologic: negative other than that listed in the HPI    Objective:   Blood pressure 110/80, pulse 78, temperature 98 F (36.7 C), temperature source Oral, resp. rate 16, last menstrual period 07/28/1996, SpO2 98 %. There is no height or weight on file to calculate BMI.   Physical Exam:  General: Alert, interactive, in no acute distress. Very pleasant.  Eyes: No conjunctival injection present on the right, No conjunctival injection present on the left, PERRL bilaterally, No discharge on the right, No discharge on the left and No Horner-Trantas dots present Ears: Right TM pearly gray with normal light reflex, Left TM pearly gray with normal light reflex, Right TM intact without perforation and Left TM intact without perforation.  Nose/Throat: External nose within normal limits and septum midline,  turbinates edematous and pale with clear discharge, post-pharynx erythematous without cobblestoning in the posterior oropharynx. Tonsils 2+ without exudates Neck: Supple without thyromegaly. Lungs: Clear to auscultation without wheezing, rhonchi or rales. No increased work of breathing. CV: Normal S1/S2, no murmurs. Capillary refill <2 seconds.  Skin: Warm and dry, without lesions or rashes. Neuro:   Grossly intact. No focal deficits appreciated. Responsive to questions.   Diagnostic studies: none    Salvatore Marvel, MD [facility]

## 2017-02-13 ENCOUNTER — Ambulatory Visit (HOSPITAL_BASED_OUTPATIENT_CLINIC_OR_DEPARTMENT_OTHER): Payer: Medicare Other | Admitting: Adult Health

## 2017-02-13 VITALS — BP 144/77 | HR 73 | Temp 98.2°F | Resp 20 | Ht 63.5 in | Wt 151.1 lb

## 2017-02-13 DIAGNOSIS — Z9012 Acquired absence of left breast and nipple: Secondary | ICD-10-CM

## 2017-02-13 DIAGNOSIS — C50412 Malignant neoplasm of upper-outer quadrant of left female breast: Secondary | ICD-10-CM

## 2017-02-13 DIAGNOSIS — C50112 Malignant neoplasm of central portion of left female breast: Secondary | ICD-10-CM | POA: Diagnosis not present

## 2017-02-13 DIAGNOSIS — Z923 Personal history of irradiation: Secondary | ICD-10-CM | POA: Diagnosis not present

## 2017-02-13 DIAGNOSIS — C50812 Malignant neoplasm of overlapping sites of left female breast: Secondary | ICD-10-CM

## 2017-02-13 DIAGNOSIS — C773 Secondary and unspecified malignant neoplasm of axilla and upper limb lymph nodes: Secondary | ICD-10-CM | POA: Diagnosis not present

## 2017-02-13 DIAGNOSIS — C50411 Malignant neoplasm of upper-outer quadrant of right female breast: Secondary | ICD-10-CM

## 2017-02-13 DIAGNOSIS — Z79810 Long term (current) use of selective estrogen receptor modulators (SERMs): Secondary | ICD-10-CM | POA: Diagnosis not present

## 2017-02-13 DIAGNOSIS — Z170 Estrogen receptor positive status [ER+]: Secondary | ICD-10-CM | POA: Diagnosis not present

## 2017-02-13 NOTE — Progress Notes (Signed)
CLINIC:  Survivorship   REASON FOR VISIT:  Routine follow-up post-treatment for a recent history of breast cancer.  BRIEF ONCOLOGIC HISTORY:    Malignant neoplasm of upper-outer quadrant of right breast in female, estrogen receptor positive ([place])   03/26/2016 Surgery    right breast upper outer quadrant lumpectomy 03/26/2016 for a pT1c pN1, stage IIA invasive ductal carcinoma, grade 1, estrogen and progesterone receptor positive, HER-2 nonamplified, with an MIB-1 of 12%              (a) mammaprint from this tumor was read as "low risk".             02/2016 Initial Diagnosis    Malignant neoplasm of upper-outer quadrant of right breast in female, estrogen receptor positive ([place])      03/26/2016 Surgery    a second right breast cancer, invasive lobular, grade 1, measuring 0.6 cm, focally involved the final right medial margin from the 03/26/2016 procedure; this tumor also was estrogen and progesterone receptor positive, HER-2 negative, with an MIB-1 of 3%             (a) medial margin cleared with additional surgery 05/20/2016.      03/2016 -  Anti-estrogen oral therapy    tamoxifen started 04/18/2016--to be continued a minimum of 5 years, likely followed by anastrozole x 2 years      08/19/2016 - 09/29/2016 Radiation Therapy    radiation therapy 08/19/16-03/05/18with capecitabine sensitization Site/dose:1) Right breast/ 50Gy in 25 fractions 2) Right breast boost/ 10 Gy in 5 fractions 3) Left breast 50.4 Gy in 28 fractions       Malignant neoplasm of overlapping sites of left breast in female, estrogen receptor positive ([place])   04/09/2016 Initial Biopsy    two biopsies from the left breast 04/09/2016 and 04/11/2016, 4.6 cm apart, showed             (a) centrally, an invasive ductal carcinoma, grade 1 or 2, with no prognostic panel available             (b) in the upper outer quadrant, invasive ductal carcinoma, grade 2, estrogen  receptor 10% positive, progesterone receptor and HER-2 negative, with an MIB-1 of 2%             (c) left axillary lymph node biopsy 04/14/2016 showed invasive ductal carcinoma, estrogen and progesterone receptor negative             (d) mammaprint from (2)(c) was also "low risk"      03/2016 Initial Diagnosis    Malignant neoplasm of overlapping sites of left breast in female, estrogen receptor positive ([place])      03/2016 -  Anti-estrogen oral therapy    tamoxifen started 04/18/2016--to be continued a minimum of 5 years, likely followed by anastrozole x 2 years      05/20/2016 Surgery    left lumpectomy 05/20/2016 showed a  pT1c pN1 stage IIA  invasive ductal carcinoma, grade 2, estrogen receptor positive, progesterone receptor negative, HER-2 nonamplified, with an MIB-1 of 2%; margins were positive       07/01/2016 Surgery    status post left sided nipple sparing mastectomy with immediate expander placement 07/01/2016 showing multiple foci of residual grade 2 invasive ductal carcinoma, the largest measuring 0.7 cm, with evidence of lymphovascular invasion and multiple close but negative margins      08/19/2016 - 09/29/2016 Radiation Therapy    radiation therapy 08/19/16-03/05/18with capecitabine sensitization Site/dose:1) Right breast/ 50Gy in 25  fractions 2) Right breast boost/ 10 Gy in 5 fractions 3) Left breast 50.4 Gy in 28 fractions       INTERVAL HISTORY:  Sabrina Tapia presents to the [facility] today for our initial meeting to review her survivorship care plan detailing her treatment course for breast cancer, as well as monitoring long-term side effects of that treatment, education regarding health maintenance, screening, and overall wellness and health promotion.     Overall, Sabrina Tapia reports feeling quite well.  She is taking Tamoxifen daily and tolerating it well for the most part.  She is experiencing some mild hot  flashes.      REVIEW OF SYSTEMS:  Review of Systems  Constitutional: Negative for appetite change, chills, fatigue, fever and unexpected weight change.  HENT:   Negative for hearing loss and lump/mass.   Eyes: Negative for eye problems and icterus.  Respiratory: Negative for chest tightness, cough and shortness of breath.   Cardiovascular: Negative for chest pain, leg swelling and palpitations.  Gastrointestinal: Negative for abdominal distention, constipation, diarrhea, nausea and vomiting.  Endocrine: Positive for hot flashes.  Musculoskeletal: Negative for arthralgias.  Skin: Negative for itching and rash.  Neurological: Negative for dizziness, extremity weakness, headaches and numbness.  Hematological: Negative for adenopathy. Does not bruise/bleed easily.  Psychiatric/Behavioral: Negative for depression. The patient is not nervous/anxious.   Breast: Denies any new nodularity, masses, tenderness, nipple changes, or nipple discharge.      ONCOLOGY TREATMENT TEAM:  1. Surgeon:  Dr. Donne Hazel at [facility] 2. Medical Oncologist: Dr. Jana Hakim     PAST MEDICAL/SURGICAL HISTORY:  Past Medical History:  Diagnosis Date  . Arthritis   . Asthma    allergy induced  . Cancer ([place]) 2017   bil breast cancer  . Cervical polyp   . Detrusor instability   . Fibroid   . GERD (gastroesophageal reflux disease)   . Heart burn   . Hemorrhoids   . History of radiation therapy    08/19/16- 09/29/16, Right Breast 50 Gy in 25 fractions, Right Breast boost 10 Gy in 5 fractions, Left Breast 50.4 Gy in 28 fractions  . Hypertonicity of bladder   . Joint pain   . LGSIL (low grade squamous intraepithelial dysplasia) 2001   colpo biopsy negative, negative paps since  . Osteopenia 02/2016   T score -1.3 FRAX 8.3%/0.7% stable from prior DEXA  . Urinary incontinence    Past Surgical History:  Procedure Laterality Date  . BREAST RECONSTRUCTION WITH PLACEMENT OF TISSUE EXPANDER AND FLEX  HD (ACELLULAR HYDRATED DERMIS) Left 07/01/2016   Procedure: BREAST RECONSTRUCTION WITH PLACEMENT OF TISSUE EXPANDER AND  ALLODERM;  Surgeon: Irene Limbo, MD;  Location: [place];  Service: Plastics;  Laterality: Left;  . INCISION AND DRAINAGE Left 06/02/2016   Procedure: DRAINAGE of left axilla seroma;  Surgeon: Rolm Bookbinder, MD;  Location: [place];  Service: General;  Laterality: Left;  . NIPPLE SPARING MASTECTOMY Left 07/01/2016   Procedure: LEFT NIPPLE SPARING MASTECTOMY;  Surgeon: Rolm Bookbinder, MD;  Location: [place];  Service: General;  Laterality: Left;  . RADIOACTIVE SEED GUIDED MASTECTOMY WITH AXILLARY SENTINEL LYMPH NODE BIOPSY Right 03/26/2016   Procedure: RIGHT BREAST LUMPECTOMY WITH RADIOACTIVE SEED AND SENTINEL LYMPH NODE BIOPSY;  Surgeon: Rolm Bookbinder, MD;  Location: [place] [facility];  Service: General;  Laterality: Right;  . RADIOACTIVE SEED GUIDED MASTECTOMY WITH AXILLARY SENTINEL LYMPH NODE BIOPSY Left 05/20/2016   Procedure: LEFT BREAST RADIOACTIVE  SEED (two seeds) GUIDED LUMPECTOMY WITH LEFT AXILLARY SENTINEL LYMPH NODE BIOPSY AND LEFT SEED TARGETED AXILLARY LYMPH NODE EXCISION;  Surgeon: Rolm Bookbinder, MD;  Location: [facility];  Service: General;  Laterality: Left;  . RE-EXCISION OF BREAST CANCER,SUPERIOR MARGINS Right 05/20/2016   Procedure: RE-EXCISION OF RIGHT BREAST MARGIN;  Surgeon: Rolm Bookbinder, MD;  Location: [facility];  Service: General;  Laterality: Right;  . RE-EXCISION OF BREAST CANCER,SUPERIOR MARGINS Left 06/02/2016   Procedure: RE-EXCISION OF BREAST CANCER,SUPERIOR MARGINS;  Surgeon: Rolm Bookbinder, MD;  Location: [place];  Service: General;  Laterality: Left;     ALLERGIES:  Allergies  Allergen Reactions  . Augmentin [Amoxicillin-Pot Clavulanate] Itching and Nausea Only  . Adhesive [Tape] Rash     CURRENT MEDICATIONS:    Outpatient Encounter Prescriptions as of 02/13/2017  Medication Sig Note  . albuterol (PROAIR HFA) 108 (90 Base) MCG/ACT inhaler Inhale 2 puffs into the lungs every 4 (four) hours as needed for wheezing or shortness of breath.   . Azelastine-Fluticasone (DYMISTA) 137-50 MCG/ACT SUSP Use 2 sprays into each nostril 1-2 times daily.   . Calcium Carbonate-Vitamin D (CALCIUM + D PO) Take by mouth.   . cetirizine (ZYRTEC) 10 MG tablet Take 10 mg by mouth. 11/21/2014: Received from: [facility]  . Cholecalciferol (VITAMIN D) 2000 units tablet Take 1 tablet (2,000 Units total) by mouth daily.   Marland Kitchen docusate sodium (COLACE) 50 MG capsule Take 50 mg by mouth. 11/21/2014: Received from: [facility]  . hydrocortisone (ANUSOL-HC) 2.5 % rectal cream Place 1 application rectally as needed for hemorrhoids or itching.   . hydrocortisone (ANUSOL-HC) 25 MG suppository Place 25 mg rectally 2 (two) times daily as needed for hemorrhoids or itching.   Marland Kitchen ibuprofen (ADVIL,MOTRIN) 200 MG tablet Take 200 mg by mouth every 6 (six) hours as needed.   Marland Kitchen omega-3 acid ethyl esters (LOVAZA) 1 g capsule Take by mouth 2 (two) times daily.   Marland Kitchen PRILOSEC OTC 20 MG tablet Reported on 12/14/2015 06/14/2014: Received from: External Pharmacy  . psyllium (METAMUCIL) 58.6 % packet Take 1 packet by mouth daily.   . tamoxifen (NOLVADEX) 20 MG tablet Take 20 mg by mouth daily.   . Turmeric 450 MG CAPS Take by mouth. 11/21/2014: Received from: [facility]  . valACYclovir (VALTREX) 1000 MG tablet Take 1,000 mg by mouth at bedtime as needed.   . venlafaxine XR (EFFEXOR-XR) 75 MG 24 hr capsule Take 1 capsule (75 mg total) by mouth daily with breakfast.   . [EXPIRED] fluconazole (DIFLUCAN) 100 MG tablet Take 1 tablet (100 mg total) by mouth daily. (Patient not taking: Reported on 02/13/2017)   . fluticasone furoate-vilanterol (BREO ELLIPTA) 100-25 MCG/INH AEPB Inhale 1 puff into the lungs daily. (Patient not taking: Reported on 02/12/2017)    No  facility-administered encounter medications on file as of 02/13/2017.      ONCOLOGIC FAMILY HISTORY:  Family History  Problem Relation Age of Onset  . Hypertension Mother   . Heart disease Mother   . Lung disease Mother   . Hypertension Father   . Cancer Father        colon  . Heart disease Father   . Lung cancer Father   . Breast cancer Paternal Grandmother        Age 76's  . Hypertension Brother   . Hyperlipidemia Brother   . Diabetes Maternal Grandfather      SOCIAL HISTORY:  TAVARIA MACKINS is single and  lives alone in [place], [place].  She has clsoe friends who live nearby, and family who live in [place], [place].  Ms. Wilcher is currently retired.  She denies any current or history of tobacco, alcohol, or illicit drug use.     PHYSICAL EXAMINATION:  Vital Signs:   Vitals:   02/13/17 1035  BP: (!) 144/77  Pulse: 73  Resp: 20  Temp: 98.2 F (36.8 C)   Filed Weights   02/13/17 1035  Weight: 151 lb 1.6 oz (68.5 kg)   General: Well-nourished, well-appearing female in no acute distress.  She is unaccompanied today.   HEENT: Head is normocephalic.  Pupils equal and reactive to light. Conjunctivae clear without exudate.  Sclerae anicteric. Oral mucosa is pink, moist.  Oropharynx is pink without lesions or erythema.  Lymph: No cervical, supraclavicular, or infraclavicular lymphadenopathy noted on palpation.  Cardiovascular: Regular rate and rhythm.Marland Kitchen Respiratory: Clear to auscultation bilaterally. Chest expansion symmetric; breathing non-labored.  GI: Abdomen soft and round; non-tender, non-distended. Bowel sounds normoactive.  GU: Deferred.  Neuro: No focal deficits. Steady gait.  Psych: Mood and affect normal and appropriate for situation.  Extremities: No edema. MSK: No focal spinal tenderness to palpation.  Full range of motion in bilateral upper extremities Skin: Warm and dry.  LABORATORY DATA:  None for this visit.  DIAGNOSTIC IMAGING:  None for  this visit.      ASSESSMENT AND PLAN:  Ms.. Tapia is a pleasant 66 y.o. female with Stage IIA bilateral breast invasive ductal carcinoma/invasive lobular carcin, ER+/PR+/HER2-, diagnosed in 02/2016, treated with multiple lumpectomies, re-excision and finally mastectomy, adjuvant radiation therapy, and anti-estrogen therapy with Tamoxifen beginning in 03/2016.  She presents to the Survivorship Clinic for our initial meeting and routine follow-up post-completion of treatment for breast cancer.    1. Stage IIA bilateral breast cancer:  Ms. Wampole is continuing to recover from definitive treatment for breast cancer. She will follow-up with her medical oncologist, Dr. Jana Hakim in 02/2017 with history and physical exam per surveillance protocol.  She will continue her anti-estrogen therapy with Tamoxifen. Thus far, she is tolerating the Tamoxifen well, with minimal side effects. Today, a comprehensive survivorship care plan and treatment summary was reviewed with the patient today detailing her breast cancer diagnosis, treatment course, potential late/long-term effects of treatment, appropriate follow-up care with recommendations for the future, and patient education resources.  A copy of this summary, along with a letter will be sent to the patient's primary care provider via mail/fax/In Basket message after today's visit.    2. Bone health:  Given Ms. Vig's age/history of breast cancer, she is at risk for bone demineralization. She has regular bone density testing with her PCP.  I counseled her that Tamoxifen will help increase her bone density.  She was given education on specific activities to promote bone health.  3. Cancer screening:  Due to Ms. Grater's history and her age, she should receive screening for skin cancers, colon cancer, and gynecologic cancers.  The information and recommendations are listed on the patient's comprehensive care plan/treatment summary and were reviewed in detail with the  patient.    4. Health maintenance and wellness promotion: Ms. Pendell was encouraged to consume 5-7 servings of fruits and vegetables per day. We reviewed the "Nutrition Rainbow" handout, as well as the handout "Take Control of Your Health and Reduce Your Cancer Risk" from the [place].  She was also encouraged to engage in moderate to vigorous exercise for 30 minutes per  day most days of the week. We discussed the [employer] fitness program, which is designed for cancer survivors to help them become more physically fit after cancer treatments.  She was instructed to limit her alcohol consumption and continue to abstain from tobacco use.     5. Support services/counseling: It is not uncommon for this period of the patient's cancer care trajectory to be one of many emotions and stressors.  We discussed an opportunity for her to participate in the next session of [facility] ("Finding Your New Normal") support group series designed for patients after they have completed treatment.   Ms. Ouderkirk was encouraged to take advantage of our many other support services programs, support groups, and/or counseling in coping with her new life as a cancer survivor after completing anti-cancer treatment.  She was offered support today through active listening and expressive supportive counseling.  She was given information regarding our available services and encouraged to contact me with any questions or for help enrolling in any of our support group/programs.    Dispo:   -Return to cancer center 02/2017 -Mammogram due in 02/2017 -Follow up with Dr. Donne Hazel at [facility], 07/2017 -She is welcome to return back to the Survivorship Clinic at any time; no additional follow-up needed at this time.  -Consider referral back to survivorship as a long-term survivor for continued surveillance  A total of (30) minutes of face-to-face time was spent with this patient with greater than 50% of that time  in counseling and care-coordination.   Gardenia Phlegm, NP Survivorship Program [place] [phone]   Note: PRIMARY CARE PROVIDER Josetta Huddle, [place] [phone]

## 2017-02-14 ENCOUNTER — Encounter: Payer: Self-pay | Admitting: Adult Health

## 2017-03-12 ENCOUNTER — Ambulatory Visit (HOSPITAL_BASED_OUTPATIENT_CLINIC_OR_DEPARTMENT_OTHER): Payer: Medicare Other | Admitting: Oncology

## 2017-03-12 ENCOUNTER — Other Ambulatory Visit (HOSPITAL_BASED_OUTPATIENT_CLINIC_OR_DEPARTMENT_OTHER): Payer: Medicare Other

## 2017-03-12 VITALS — BP 129/81 | HR 65 | Temp 98.5°F | Ht 63.5 in | Wt 155.9 lb

## 2017-03-12 DIAGNOSIS — Z170 Estrogen receptor positive status [ER+]: Secondary | ICD-10-CM

## 2017-03-12 DIAGNOSIS — C773 Secondary and unspecified malignant neoplasm of axilla and upper limb lymph nodes: Secondary | ICD-10-CM

## 2017-03-12 DIAGNOSIS — C50412 Malignant neoplasm of upper-outer quadrant of left female breast: Secondary | ICD-10-CM

## 2017-03-12 DIAGNOSIS — C50411 Malignant neoplasm of upper-outer quadrant of right female breast: Secondary | ICD-10-CM | POA: Diagnosis not present

## 2017-03-12 DIAGNOSIS — C50812 Malignant neoplasm of overlapping sites of left female breast: Secondary | ICD-10-CM

## 2017-03-12 DIAGNOSIS — C50112 Malignant neoplasm of central portion of left female breast: Secondary | ICD-10-CM | POA: Diagnosis not present

## 2017-03-12 DIAGNOSIS — M8580 Other specified disorders of bone density and structure, unspecified site: Secondary | ICD-10-CM | POA: Diagnosis not present

## 2017-03-12 DIAGNOSIS — T148XXA Other injury of unspecified body region, initial encounter: Secondary | ICD-10-CM

## 2017-03-12 LAB — CBC WITH DIFFERENTIAL/PLATELET
BASO%: 1.1 % (ref 0.0–2.0)
BASOS ABS: 0.1 10*3/uL (ref 0.0–0.1)
EOS ABS: 0 10*3/uL (ref 0.0–0.5)
EOS%: 0 % (ref 0.0–7.0)
HEMATOCRIT: 41.9 % (ref 34.8–46.6)
HEMOGLOBIN: 14.1 g/dL (ref 11.6–15.9)
LYMPH%: 15.3 % (ref 14.0–49.7)
MCH: 31.2 pg (ref 25.1–34.0)
MCHC: 33.7 g/dL (ref 31.5–36.0)
MCV: 92.6 fL (ref 79.5–101.0)
MONO#: 0.7 10*3/uL (ref 0.1–0.9)
MONO%: 11.8 % (ref 0.0–14.0)
NEUT#: 4.2 10*3/uL (ref 1.5–6.5)
NEUT%: 71.8 % (ref 38.4–76.8)
PLATELETS: 333 10*3/uL (ref 145–400)
RBC: 4.52 10*6/uL (ref 3.70–5.45)
RDW: 15.5 % — ABNORMAL HIGH (ref 11.2–14.5)
WBC: 5.8 10*3/uL (ref 3.9–10.3)
lymph#: 0.9 10*3/uL (ref 0.9–3.3)

## 2017-03-12 LAB — COMPREHENSIVE METABOLIC PANEL
ALBUMIN: 3.6 g/dL (ref 3.5–5.0)
ALK PHOS: 57 U/L (ref 40–150)
ALT: 33 U/L (ref 0–55)
ANION GAP: 9 meq/L (ref 3–11)
AST: 34 U/L (ref 5–34)
BUN: 12.4 mg/dL (ref 7.0–26.0)
CALCIUM: 9.1 mg/dL (ref 8.4–10.4)
CO2: 25 mEq/L (ref 22–29)
Chloride: 103 mEq/L (ref 98–109)
Creatinine: 0.7 mg/dL (ref 0.6–1.1)
Glucose: 78 mg/dl (ref 70–140)
POTASSIUM: 4 meq/L (ref 3.5–5.1)
SODIUM: 136 meq/L (ref 136–145)
Total Bilirubin: 0.41 mg/dL (ref 0.20–1.20)
Total Protein: 6.6 g/dL (ref 6.4–8.3)

## 2017-03-12 NOTE — Progress Notes (Signed)
[place]  [phone] [phone]     ID: Sabrina Tapia DOB: [birth year]  MR#: [id number]  OMV#:[id number]  Patient Care Team: Josetta Huddle, MD as PCP - General (Internal Medicine) Rolm Bookbinder, MD as Consulting Physician (General Surgery) Vincent Ehrler, Virgie Dad, MD as Consulting Physician (Oncology) Phineas Real, Belinda Block, MD as Consulting Physician (Gynecology) Delice Bison, Charlestine Massed, NP as Nurse Practitioner (Hematology and Oncology) OTHER MD:  CHIEF COMPLAINT: Estrogen receptor positive breast cancer  CURRENT TREATMENT: Tamoxifen   BREAST CANCER HISTORY: From the earlier summary note:  "Sabrina Tapia" had screening mammography at her gynecologist's office suggestive of a change in the upper-outer quadrant of the right breast. She was referred to [facility] where on 03/04/2016 she underwent bilateral diagnostic mammography and right breast ultrasonography. The breast density was category C. In the upper outer quadrant of the right breast there was a new area of architectural distortion. By ultrasonography this measured 0.5 cm. The right axilla was reported as sonographically benign.  On 03/06/2016 she underwent core needle biopsy of the right breast mass in question, and this showed ([id number]) an invasive ductal carcinoma, grade 1, estrogen receptor 95% positive, progesterone receptor 95% positive, both with strong staining intensity, with an MIB-1 of 12%, and no HER-2 amplification, the signals ratio being 1.54 and the number per cell 2.15.  Her case was presented in the multidisciplinary breast cancer conference 03/12/2016.Marland Kitchen At that time a preliminary plan was proposed: Breast conserving surgery with sentinel lymph node sampling, no Oncotype if the tumor was no larger than 5 mm, and consideration of genetics testing.  On 03/26/2016 Sabrina Tapia underwent right lumpectomy and sentinel lymph node sampling. This showed ([id number]) an invasive ductal carcinoma  measuring 1.4 cm, grade 1, with negative margins, and a macrometastatic deposit of ductal carcinoma in the single sentinel lymph node. Mammaprint from this tumor was read as "low risk".  However there was a separate invasive lobular carcinoma measuring 0.6 cm,  focally involving the final right medial margin. Because of the lobular breast cancer bilateral breast MRI was obtained 04/04/2016. This showed in the right breast a 7.4 x 4.5 cm seroma with 2 enhancing masses posterior to the lumpectomy cavity, the larger measuring 1.0 cm. These were felt possibly to be reactive lymph nodes. One of these lymph nodes was biopsied 04/09/2016 and this showed ([id number]) benign lymph node tissue. The MRI showed no evidence of additional disease in the right breast.  However in the left breast centrally there was a 1.3 cm enhancing mass. Biopsy of this left breast mass 04/09/2016 ([id number]) showed an invasive ductal carcinoma, grade 1 or 2, with insufficient tissue for a prognostic panel. On 04/11/2016 biopsy of a second left breast mass, upper outer quadrant, 4.6 cm a way from the other left breast mass, showed ([id number]) an invasive ductal carcinoma, grade 2, estrogen receptor 10% positive with strong staining intensity, progesterone receptor negative, with an MIB-1 of 2%, and no HER-2 amplification, the signals ratio being 1.50 and the number per cell 2.10. The proliferation fraction was 2%.  On 04/14/2016 Sabrina Tapia underwent biopsy of a suspicious left axillary lymph node and this ([id number]) was positive for invasive ductal carcinoma, estrogen and progesterone receptor negative, with an MIB-1 of 10%. I do not find that HER-2 was repeated. Mammaprint from this tumor also came back low risk.  Her subsequent history is as detailed below.  INTERVAL HISTORY: Sabrina Tapia returns today for follow-up and treatment of her estrogen receptor positive breast  cancer. She continues on tamoxifen with good tolerance. Hot  flashes and vaginal wetness are not major issues. She obtains a drug at a good price.  Final implant placement is scheduled for 04/17/2017  REVIEW OF SYSTEMS: She just returned from a trip to [place] for a wedding and had a very good time. She has had 2 urinary tract infections in the past year and she tells me she never had any before. She wonders if tamoxifen might be related to that. She's also had a couple of episodes of thrush. These were treated by her allergist. Finally she has noted some bruising, particularly in the dorsal aspect of her left forearm. She understands she has hit that side in some way in but she feels these were minor bumps and they should not have produced such extensive bruising. Aside from these issues a detailed review of systems today was stable  PAST MEDICAL HISTORY: Past Medical History:  Diagnosis Date  . Arthritis   . Asthma    allergy induced  . Cancer ([place]) 2017   bil breast cancer  . Cervical polyp   . Detrusor instability   . Fibroid   . GERD (gastroesophageal reflux disease)   . Heart burn   . Hemorrhoids   . History of radiation therapy    08/19/16- 09/29/16, Right Breast 50 Gy in 25 fractions, Right Breast boost 10 Gy in 5 fractions, Left Breast 50.4 Gy in 28 fractions  . Hypertonicity of bladder   . Joint pain   . LGSIL (low grade squamous intraepithelial dysplasia) 2001   colpo biopsy negative, negative paps since  . Osteopenia 02/2016   T score -1.3 FRAX 8.3%/0.7% stable from prior DEXA  . Urinary incontinence     PAST SURGICAL HISTORY: Past Surgical History:  Procedure Laterality Date  . BREAST RECONSTRUCTION WITH PLACEMENT OF TISSUE EXPANDER AND FLEX HD (ACELLULAR HYDRATED DERMIS) Left 07/01/2016   Procedure: BREAST RECONSTRUCTION WITH PLACEMENT OF TISSUE EXPANDER AND  ALLODERM;  Surgeon: Irene Limbo, MD;  Location: [place];  Service: Plastics;  Laterality: Left;  . INCISION AND DRAINAGE Left 06/02/2016   Procedure:  DRAINAGE of left axilla seroma;  Surgeon: Rolm Bookbinder, MD;  Location: [place];  Service: General;  Laterality: Left;  . NIPPLE SPARING MASTECTOMY Left 07/01/2016   Procedure: LEFT NIPPLE SPARING MASTECTOMY;  Surgeon: Rolm Bookbinder, MD;  Location: [place];  Service: General;  Laterality: Left;  . RADIOACTIVE SEED GUIDED MASTECTOMY WITH AXILLARY SENTINEL LYMPH NODE BIOPSY Right 03/26/2016   Procedure: RIGHT BREAST LUMPECTOMY WITH RADIOACTIVE SEED AND SENTINEL LYMPH NODE BIOPSY;  Surgeon: Rolm Bookbinder, MD;  Location: [place];  Service: General;  Laterality: Right;  . RADIOACTIVE SEED GUIDED MASTECTOMY WITH AXILLARY SENTINEL LYMPH NODE BIOPSY Left 05/20/2016   Procedure: LEFT BREAST RADIOACTIVE SEED (two seeds) GUIDED LUMPECTOMY WITH LEFT AXILLARY SENTINEL LYMPH NODE BIOPSY AND LEFT SEED TARGETED AXILLARY LYMPH NODE EXCISION;  Surgeon: Rolm Bookbinder, MD;  Location: [place];  Service: General;  Laterality: Left;  . RE-EXCISION OF BREAST CANCER,SUPERIOR MARGINS Right 05/20/2016   Procedure: RE-EXCISION OF RIGHT BREAST MARGIN;  Surgeon: Rolm Bookbinder, MD;  Location: [place];  Service: General;  Laterality: Right;  . RE-EXCISION OF BREAST CANCER,SUPERIOR MARGINS Left 06/02/2016   Procedure: RE-EXCISION OF BREAST CANCER,SUPERIOR MARGINS;  Surgeon: Rolm Bookbinder, MD;  Location: [place];  Service: General;  Laterality: Left;    FAMILY HISTORY Family History  Problem Relation  Age of Onset  . Hypertension Mother   . Heart disease Mother   . Lung disease Mother   . Hypertension Father   . Cancer Father        colon  . Heart disease Father   . Lung cancer Father   . Breast cancer Paternal Grandmother        Age 6's  . Hypertension Brother   . Hyperlipidemia Brother   . Diabetes Maternal Grandfather   The patient's father died from lung cancer at the age of 59. He also had  a remote history of colon cancer. His mother had a history of breast cancer in her 31s. The patient's mother died at age 31. The patient has one brother, no sisters.  GYNECOLOGIC HISTORY:  Patient's last menstrual period was 07/28/1996 (approximate). Menarche age 66, the patient is GX P0. 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 She lives alone, with no pets.    ADVANCED DIRECTIVES: In place. She has named her brother Marya Amsler as her healthcare part of attorney. He can be reached at [phone]   HEALTH MAINTENANCE: Social History  Substance Use Topics  . Smoking status: Former Smoker    Quit date: 12/20/1984  . Smokeless tobacco: Never Used  . Alcohol use 4.8 - 6.0 oz/week    8 - 10 Cans of beer per week     Comment: daily beer     Colonoscopy:February 2016  PAP:  Bone density: 03/04/2016/osteopenia   Allergies  Allergen Reactions  . Augmentin [Amoxicillin-Pot Clavulanate] Itching and Nausea Only  . Adhesive [Tape] Rash    Current Outpatient Prescriptions  Medication Sig Dispense Refill  . albuterol (PROAIR HFA) 108 (90 Base) MCG/ACT inhaler Inhale 2 puffs into the lungs every 4 (four) hours as needed for wheezing or shortness of breath. 1 Inhaler 1  . Azelastine-Fluticasone (DYMISTA) 137-50 MCG/ACT SUSP Use 2 sprays into each nostril 1-2 times daily. 3 Bottle 1  . Calcium Carbonate-Vitamin D (CALCIUM + D PO) Take by mouth.    . cetirizine (ZYRTEC) 10 MG tablet Take 10 mg by mouth.    . Cholecalciferol (VITAMIN D) 2000 units tablet Take 1 tablet (2,000 Units total) by mouth daily.    Marland Kitchen docusate sodium (COLACE) 50 MG capsule Take 50 mg by mouth.    . fluticasone furoate-vilanterol (BREO ELLIPTA) 100-25 MCG/INH AEPB Inhale 1 puff into the lungs daily. (Patient not taking: Reported on 02/12/2017) 60  each 3  . hydrocortisone (ANUSOL-HC) 2.5 % rectal cream Place 1 application rectally as needed for hemorrhoids or itching.    . hydrocortisone (ANUSOL-HC) 25 MG suppository Place 25 mg rectally 2 (two) times daily as needed for hemorrhoids or itching.    Marland Kitchen ibuprofen (ADVIL,MOTRIN) 200 MG tablet Take 200 mg by mouth every 6 (six) hours as needed.    Marland Kitchen omega-3 acid ethyl esters (LOVAZA) 1 g capsule Take by mouth 2 (two) times daily.    Marland Kitchen PRILOSEC OTC 20 MG tablet Reported on 12/14/2015    . psyllium (METAMUCIL) 58.6 % packet Take 1 packet by mouth daily.    . tamoxifen (NOLVADEX) 20 MG tablet Take 20 mg by mouth daily.    . Turmeric 450 MG CAPS Take by mouth.    . valACYclovir (VALTREX) 1000 MG tablet Take 1,000 mg by mouth at bedtime  as needed.    . venlafaxine XR (EFFEXOR-XR) 75 MG 24 hr capsule Take 1 capsule (75 mg total) by mouth daily with breakfast. 90 capsule 12   No current facility-administered medications for this visit.     OBJECTIVE: Middle-aged white woman Who appears well  Vitals:   03/12/17 1131  BP: 129/81  Pulse: 65  Temp: 98.5 F (36.9 C)  SpO2: 100%     Body mass index is 27.18 kg/m.    ECOG FS:0 - Asymptomatic  Sclerae unicteric, EOMs intact Oropharynx clear and moist No cervical or supraclavicular adenopathy Lungs no rales or rhonchi Heart regular rate and rhythm Abd soft, nontender, positive bowel sounds MSK no focal spinal tenderness, no upper extremity lymphedema Neuro: nonfocal, well oriented, appropriate affect Breasts: The right breast is status post lumpectomy and radiation with no evidence of disease recurrence. (Breast is status post mastectomy with implant in place. There is no evidence of disease recurrence. Both axillae are benign.  LAB RESULTS:  CMP     Component Value Date/Time   NA 136 03/12/2017 1115   K 4.0 03/12/2017 1115   CO2 25 03/12/2017 1115   GLUCOSE 78 03/12/2017 1115   BUN 12.4 03/12/2017 1115   CREATININE 0.7 03/12/2017 1115    CALCIUM 9.1 03/12/2017 1115   PROT 6.6 03/12/2017 1115   ALBUMIN 3.6 03/12/2017 1115   AST 34 03/12/2017 1115   ALT 33 03/12/2017 1115   ALKPHOS 57 03/12/2017 1115   BILITOT 0.41 03/12/2017 1115    INo results found for: SPEP, UPEP  Lab Results  Component Value Date   WBC 5.8 03/12/2017   NEUTROABS 4.2 03/12/2017   HGB 14.1 03/12/2017   HCT 41.9 03/12/2017   MCV 92.6 03/12/2017   PLT 333 03/12/2017      Chemistry      Component Value Date/Time   NA 136 03/12/2017 1115   K 4.0 03/12/2017 1115   CO2 25 03/12/2017 1115   BUN 12.4 03/12/2017 1115   CREATININE 0.7 03/12/2017 1115      Component Value Date/Time   CALCIUM 9.1 03/12/2017 1115   ALKPHOS 57 03/12/2017 1115   AST 34 03/12/2017 1115   ALT 33 03/12/2017 1115   BILITOT 0.41 03/12/2017 1115       No results found for: LABCA2  No components found for: LABCA125  No results for input(s): INR in the last 168 hours.  Urinalysis    Component Value Date/Time   COLORURINE YELLOW 01/31/2016 1526   APPEARANCEUR CLEAR 01/31/2016 1526   LABSPEC 1.006 01/31/2016 1526   PHURINE 6.5 01/31/2016 1526   GLUCOSEU NEGATIVE 01/31/2016 1526   HGBUR NEGATIVE 01/31/2016 1526   BILIRUBINUR NEGATIVE 01/31/2016 1526   KETONESUR NEGATIVE 01/31/2016 1526   PROTEINUR NEGATIVE 01/31/2016 1526   UROBILINOGEN 0.2 12/11/2014 1506   NITRITE NEGATIVE 01/31/2016 1526   LEUKOCYTESUR TRACE (A) 01/31/2016 1526     STUDIES: Right mammogram is scheduled for next week  ELIGIBLE FOR AVAILABLE RESEARCH PROTOCOL: no  ASSESSMENT: 66 y.o. [place] woman status post right breast upper outer quadrant lumpectomy 03/26/2016 for a pT1c pN1, stage IIA invasive ductal carcinoma, grade 1, estrogen and progesterone receptor positive, HER-2 nonamplified, with an MIB-1 of 12%   (a) mammaprint from this tumor was read as "low risk".   (1) a second right breast cancer, invasive lobular, grade 1, measuring 0.6 cm, focally involved the final right  medial margin from the 03/26/2016 procedure; this tumor also was estrogen and progesterone receptor positive,  HER-2 negative, with an MIB-1 of 3%  (a) medial margin cleared with additional surgery 05/20/2016.  (2) two biopsies from the left breast 04/09/2016 and 04/11/2016, 4.6 cm apart, showed  (a) centrally, an invasive ductal carcinoma, grade 1 or 2, with no prognostic panel available  (b) in the upper outer quadrant, invasive ductal carcinoma, grade 2, estrogen receptor 10% positive, progesterone receptor and HER-2 negative, with an MIB-1 of 2%  (c) left axillary lymph node biopsy 04/14/2016 showed invasive ductal carcinoma, estrogen and progesterone receptor negative  (d) mammaprint from (2)(c) was also "low risk"  (3) left lumpectomy 05/20/2016 showed a  pT1c pN1 stage IIA  invasive ductal carcinoma, grade 2, estrogen receptor positive, progesterone receptor negative, HER-2 nonamplified, with an MIB-1 of 2%; margins were positive   (4) status post left sided nipple sparing mastectomy with immediate expander placement 07/01/2016 showing multiple foci of residual grade 2 invasive ductal carcinoma, the largest measuring 0.7 cm, with evidence of lymphovascular invasion and multiple close but negative margins  (5) radiation therapy 08/19/16-03/05/18with capecitabine sensitization Site/dose:   1) Right breast/ 50Gy in 25 fractions                         2) Right breast boost/ 10 Gy in 5 fractions                         3) Left breast 50.4 Gy in 28 fractions   (6) tamoxifen started 04/18/2016--to be continued a minimum of 5 years, likely followed by anastrozole x 2 years   PLAN; Sabrina Tapia will soon be a year out from definitive surgery for her breast cancer, with no evidence of disease recurrence. This is favorable.  She is tolerating tamoxifen well. I don't think any of the problems that she reports are going to be related to that.  Specifically I think the bruising she is experiencing may  be due to turmeric. I have asked her to stop that and see if that problem improves. Of course a thrush problem it has to do with inhaled steroids. I don't know why she had urinary tract infections 2 in the past year when she never had them before but I associated that more with the aromatase inhibitors then with tamoxifen.  In short I believe she is tolerating tamoxifen well. The plan will be to continue that for total of 5 years.  She is scheduled for right mammography next week.   She is planning to see Dr. Donne Hazel in November. She will return to see me in February. She knows to call for any problems that may develop before her next visit.  :Chauncey Cruel, MD   03/12/2017 1:19 PM Medical Oncology and Hematology [facility] [place], [place] [address] Tel. [phone]    Fax. [phone]

## 2017-03-18 ENCOUNTER — Other Ambulatory Visit: Payer: Self-pay | Admitting: Allergy & Immunology

## 2017-04-10 ENCOUNTER — Encounter (HOSPITAL_BASED_OUTPATIENT_CLINIC_OR_DEPARTMENT_OTHER): Payer: Self-pay | Admitting: *Deleted

## 2017-04-10 NOTE — Pre-Procedure Instructions (Signed)
To come pick up Ensure pre-surgery drink 10 oz. - to drink by 0400 DOS. 

## 2017-04-16 NOTE — Progress Notes (Signed)
Ensure pre surgery drink given with instructions, hibliclens soap given with instructions, pt verbalized understanding.

## 2017-04-17 ENCOUNTER — Ambulatory Visit (HOSPITAL_BASED_OUTPATIENT_CLINIC_OR_DEPARTMENT_OTHER): Payer: Medicare Other | Admitting: Anesthesiology

## 2017-04-17 ENCOUNTER — Encounter (HOSPITAL_BASED_OUTPATIENT_CLINIC_OR_DEPARTMENT_OTHER)
Admission: RE | Disposition: A | Discharge status: Home / Self Care | Payer: Self-pay | Source: Ambulatory Visit | Attending: Plastic Surgery

## 2017-04-17 ENCOUNTER — Ambulatory Visit (HOSPITAL_BASED_OUTPATIENT_CLINIC_OR_DEPARTMENT_OTHER)
Admission: RE | Admit: 2017-04-17 | Discharge: 2017-04-17 | Disposition: A | Discharge status: Home / Self Care | Payer: Medicare Other | Source: Ambulatory Visit | Attending: Plastic Surgery | Admitting: Plastic Surgery

## 2017-04-17 ENCOUNTER — Encounter (HOSPITAL_BASED_OUTPATIENT_CLINIC_OR_DEPARTMENT_OTHER): Payer: Self-pay

## 2017-04-17 DIAGNOSIS — Z87891 Personal history of nicotine dependence: Secondary | ICD-10-CM | POA: Diagnosis not present

## 2017-04-17 DIAGNOSIS — Z9012 Acquired absence of left breast and nipple: Secondary | ICD-10-CM | POA: Diagnosis not present

## 2017-04-17 DIAGNOSIS — L723 Sebaceous cyst: Secondary | ICD-10-CM | POA: Insufficient documentation

## 2017-04-17 DIAGNOSIS — Z853 Personal history of malignant neoplasm of breast: Secondary | ICD-10-CM | POA: Diagnosis not present

## 2017-04-17 DIAGNOSIS — Z421 Encounter for breast reconstruction following mastectomy: Secondary | ICD-10-CM | POA: Diagnosis present

## 2017-04-17 DIAGNOSIS — Z923 Personal history of irradiation: Secondary | ICD-10-CM | POA: Insufficient documentation

## 2017-04-17 DIAGNOSIS — K219 Gastro-esophageal reflux disease without esophagitis: Secondary | ICD-10-CM | POA: Insufficient documentation

## 2017-04-17 DIAGNOSIS — J449 Chronic obstructive pulmonary disease, unspecified: Secondary | ICD-10-CM | POA: Insufficient documentation

## 2017-04-17 HISTORY — PX: MASTOPEXY: SHX5358

## 2017-04-17 HISTORY — DX: Unspecified cataract: H26.9

## 2017-04-17 HISTORY — DX: Overactive bladder: N32.81

## 2017-04-17 HISTORY — DX: Unspecified asthma, uncomplicated: J45.909

## 2017-04-17 HISTORY — DX: Spontaneous ecchymoses: R23.3

## 2017-04-17 HISTORY — DX: Unspecified osteoarthritis, unspecified site: M19.90

## 2017-04-17 HISTORY — DX: Family history of other specified conditions: Z84.89

## 2017-04-17 HISTORY — DX: Other skin changes: R23.8

## 2017-04-17 HISTORY — DX: Dental restoration status: Z98.811

## 2017-04-17 HISTORY — DX: Personal history of malignant neoplasm of breast: Z85.3

## 2017-04-17 HISTORY — PX: REMOVAL OF TISSUE EXPANDER AND PLACEMENT OF IMPLANT: SHX6457

## 2017-04-17 SURGERY — REMOVAL, TISSUE EXPANDER, BREAST, WITH IMPLANT INSERTION
Anesthesia: General | Site: Breast | Laterality: Right

## 2017-04-17 MED ORDER — MIDAZOLAM HCL 2 MG/2ML IJ SOLN: 0.5000 mg | Freq: Once | INTRAMUSCULAR | Status: DC | PRN

## 2017-04-17 MED ORDER — KETOROLAC TROMETHAMINE 30 MG/ML IJ SOLN
INTRAMUSCULAR | Status: AC
  Filled 2017-04-17: qty 1

## 2017-04-17 MED ORDER — ROCURONIUM BROMIDE 10 MG/ML (PF) SYRINGE
PREFILLED_SYRINGE | INTRAVENOUS | Status: AC
  Filled 2017-04-17: qty 5

## 2017-04-17 MED ORDER — GABAPENTIN 300 MG PO CAPS
300.0000 mg | ORAL_CAPSULE | ORAL | Status: AC
  Administered 2017-04-17: 300 mg via ORAL

## 2017-04-17 MED ORDER — MIDAZOLAM HCL 5 MG/5ML IJ SOLN
INTRAMUSCULAR | Status: DC | PRN
  Administered 2017-04-17: 2 mg via INTRAVENOUS

## 2017-04-17 MED ORDER — SUGAMMADEX SODIUM 200 MG/2ML IV SOLN
INTRAVENOUS | Status: DC | PRN
  Administered 2017-04-17: 200 mg via INTRAVENOUS

## 2017-04-17 MED ORDER — FENTANYL CITRATE (PF) 100 MCG/2ML IJ SOLN
INTRAMUSCULAR | Status: AC
  Filled 2017-04-17: qty 2

## 2017-04-17 MED ORDER — SUGAMMADEX SODIUM 200 MG/2ML IV SOLN
INTRAVENOUS | Status: AC
  Filled 2017-04-17: qty 2

## 2017-04-17 MED ORDER — FENTANYL CITRATE (PF) 100 MCG/2ML IJ SOLN
INTRAMUSCULAR | Status: DC | PRN
  Administered 2017-04-17: 25 ug via INTRAVENOUS
  Administered 2017-04-17: 100 ug via INTRAVENOUS
  Administered 2017-04-17: 25 ug via INTRAVENOUS
  Administered 2017-04-17: 50 ug via INTRAVENOUS
  Administered 2017-04-17 (×4): 25 ug via INTRAVENOUS

## 2017-04-17 MED ORDER — GABAPENTIN 300 MG PO CAPS
ORAL_CAPSULE | ORAL | Status: AC
  Filled 2017-04-17: qty 1

## 2017-04-17 MED ORDER — HYDROMORPHONE HCL 1 MG/ML IJ SOLN
0.2500 mg | INTRAMUSCULAR | Status: DC | PRN
  Administered 2017-04-17: 0.5 mg via INTRAVENOUS
  Administered 2017-04-17 (×2): 0.25 mg via INTRAVENOUS

## 2017-04-17 MED ORDER — PROMETHAZINE HCL 25 MG/ML IJ SOLN: 6.2500 mg | INTRAMUSCULAR | Status: DC | PRN

## 2017-04-17 MED ORDER — CELECOXIB 200 MG PO CAPS
ORAL_CAPSULE | ORAL | Status: AC
  Filled 2017-04-17: qty 2

## 2017-04-17 MED ORDER — ACETAMINOPHEN 325 MG PO TABS
ORAL_TABLET | ORAL | Status: AC
  Filled 2017-04-17: qty 3

## 2017-04-17 MED ORDER — DOXYCYCLINE HYCLATE 50 MG PO CAPS: 50.0000 mg | ORAL_CAPSULE | Freq: Two times a day (BID) | ORAL | 0 refills | Status: DC

## 2017-04-17 MED ORDER — MIDAZOLAM HCL 2 MG/2ML IJ SOLN: 1.0000 mg | INTRAMUSCULAR | Status: DC | PRN

## 2017-04-17 MED ORDER — SCOPOLAMINE 1 MG/3DAYS TD PT72
MEDICATED_PATCH | TRANSDERMAL | Status: AC
  Filled 2017-04-17: qty 1

## 2017-04-17 MED ORDER — MIDAZOLAM HCL 2 MG/2ML IJ SOLN
INTRAMUSCULAR | Status: AC
  Filled 2017-04-17: qty 2

## 2017-04-17 MED ORDER — CELECOXIB 400 MG PO CAPS
400.0000 mg | ORAL_CAPSULE | ORAL | Status: AC
  Administered 2017-04-17: 400 mg via ORAL

## 2017-04-17 MED ORDER — HYDROMORPHONE HCL 1 MG/ML IJ SOLN
INTRAMUSCULAR | Status: AC
  Filled 2017-04-17: qty 0.5

## 2017-04-17 MED ORDER — ONDANSETRON HCL 4 MG/2ML IJ SOLN
INTRAMUSCULAR | Status: DC | PRN
  Administered 2017-04-17: 4 mg via INTRAVENOUS

## 2017-04-17 MED ORDER — CHLORHEXIDINE GLUCONATE CLOTH 2 % EX PADS: 6.0000 | MEDICATED_PAD | Freq: Once | CUTANEOUS | Status: DC

## 2017-04-17 MED ORDER — ROCURONIUM BROMIDE 100 MG/10ML IV SOLN
INTRAVENOUS | Status: DC | PRN
  Administered 2017-04-17: 30 mg via INTRAVENOUS
  Administered 2017-04-17: 10 mg via INTRAVENOUS
  Administered 2017-04-17: 20 mg via INTRAVENOUS

## 2017-04-17 MED ORDER — SCOPOLAMINE 1 MG/3DAYS TD PT72: 1.0000 | MEDICATED_PATCH | Freq: Once | TRANSDERMAL | Status: DC

## 2017-04-17 MED ORDER — LIDOCAINE 2% (20 MG/ML) 5 ML SYRINGE
INTRAMUSCULAR | Status: AC
  Filled 2017-04-17: qty 5

## 2017-04-17 MED ORDER — CEFAZOLIN SODIUM-DEXTROSE 2-4 GM/100ML-% IV SOLN
INTRAVENOUS | Status: AC
  Filled 2017-04-17: qty 100

## 2017-04-17 MED ORDER — PROPOFOL 10 MG/ML IV BOLUS
INTRAVENOUS | Status: DC | PRN
  Administered 2017-04-17: 150 mg via INTRAVENOUS

## 2017-04-17 MED ORDER — EPHEDRINE SULFATE 50 MG/ML IJ SOLN
INTRAMUSCULAR | Status: DC | PRN
  Administered 2017-04-17 (×2): 10 mg via INTRAVENOUS

## 2017-04-17 MED ORDER — DEXAMETHASONE SODIUM PHOSPHATE 10 MG/ML IJ SOLN
INTRAMUSCULAR | Status: AC
  Filled 2017-04-17: qty 1

## 2017-04-17 MED ORDER — ACETAMINOPHEN 500 MG PO TABS
1000.0000 mg | ORAL_TABLET | ORAL | Status: AC
  Administered 2017-04-17: 975 mg via ORAL

## 2017-04-17 MED ORDER — SODIUM CHLORIDE 0.9 % IV SOLN
INTRAVENOUS | Status: DC | PRN
  Administered 2017-04-17: 1000 mL

## 2017-04-17 MED ORDER — KETOROLAC TROMETHAMINE 30 MG/ML IJ SOLN
INTRAMUSCULAR | Status: DC | PRN
  Administered 2017-04-17: 30 mg via INTRAVENOUS

## 2017-04-17 MED ORDER — LIDOCAINE HCL (CARDIAC) 20 MG/ML IV SOLN
INTRAVENOUS | Status: DC | PRN
  Administered 2017-04-17: 40 mg via INTRAVENOUS

## 2017-04-17 MED ORDER — PROPOFOL 10 MG/ML IV BOLUS
INTRAVENOUS | Status: AC
  Filled 2017-04-17: qty 40

## 2017-04-17 MED ORDER — FENTANYL CITRATE (PF) 100 MCG/2ML IJ SOLN: 50.0000 ug | INTRAMUSCULAR | Status: DC | PRN

## 2017-04-17 MED ORDER — MEPERIDINE HCL 25 MG/ML IJ SOLN: 6.2500 mg | INTRAMUSCULAR | Status: DC | PRN

## 2017-04-17 MED ORDER — LACTATED RINGERS IV SOLN
INTRAVENOUS | Status: DC
  Administered 2017-04-17 (×2): via INTRAVENOUS

## 2017-04-17 MED ORDER — CEFAZOLIN SODIUM-DEXTROSE 2-4 GM/100ML-% IV SOLN
2.0000 g | INTRAVENOUS | Status: AC
  Administered 2017-04-17: 2 g via INTRAVENOUS

## 2017-04-17 MED ORDER — OXYCODONE HCL 5 MG PO TABS: 5.0000 mg | ORAL_TABLET | ORAL | 0 refills | Status: AC | PRN

## 2017-04-17 MED ORDER — DEXAMETHASONE SODIUM PHOSPHATE 4 MG/ML IJ SOLN
INTRAMUSCULAR | Status: DC | PRN
  Administered 2017-04-17: 10 mg via INTRAVENOUS

## 2017-04-17 MED ORDER — SCOPOLAMINE 1 MG/3DAYS TD PT72
1.0000 | MEDICATED_PATCH | Freq: Once | TRANSDERMAL | Status: AC | PRN
  Administered 2017-04-17: 1 via TRANSDERMAL

## 2017-04-17 MED ORDER — ONDANSETRON HCL 4 MG/2ML IJ SOLN
INTRAMUSCULAR | Status: AC
  Filled 2017-04-17: qty 2

## 2017-04-17 SURGICAL SUPPLY — 75 items
ADH SKN CLS APL DERMABOND .7 (GAUZE/BANDAGES/DRESSINGS) ×8
BAG DECANTER FOR FLEXI CONT (MISCELLANEOUS) ×5 IMPLANT
BINDER BREAST LRG (GAUZE/BANDAGES/DRESSINGS) IMPLANT
BINDER BREAST MEDIUM (GAUZE/BANDAGES/DRESSINGS) IMPLANT
BINDER BREAST XLRG (GAUZE/BANDAGES/DRESSINGS) IMPLANT
BLADE SURG 10 STRL SS (BLADE) ×10 IMPLANT
BLADE SURG 15 STRL LF DISP TIS (BLADE) IMPLANT
BLADE SURG 15 STRL SS (BLADE)
BNDG GAUZE ELAST 4 BULKY (GAUZE/BANDAGES/DRESSINGS) ×10 IMPLANT
CANISTER SUCT 1200ML W/VALVE (MISCELLANEOUS) ×5 IMPLANT
CHLORAPREP W/TINT 26ML (MISCELLANEOUS) ×5 IMPLANT
COVER BACK TABLE 60X90IN (DRAPES) ×5 IMPLANT
COVER MAYO STAND STRL (DRAPES) ×5 IMPLANT
COVER SURGICAL LIGHT HANDLE (MISCELLANEOUS) ×3 IMPLANT
DECANTER SPIKE VIAL GLASS SM (MISCELLANEOUS) IMPLANT
DERMABOND ADVANCED (GAUZE/BANDAGES/DRESSINGS) ×2
DERMABOND ADVANCED .7 DNX12 (GAUZE/BANDAGES/DRESSINGS) ×8 IMPLANT
DRAIN CHANNEL 15F RND FF W/TCR (WOUND CARE) IMPLANT
DRAIN CHANNEL 19F RND (DRAIN) IMPLANT
DRAPE TOP ARMCOVERS (MISCELLANEOUS) ×5 IMPLANT
DRAPE U-SHAPE 76X120 STRL (DRAPES) ×5 IMPLANT
DRAPE UTILITY XL STRL (DRAPES) IMPLANT
DRSG PAD ABDOMINAL 8X10 ST (GAUZE/BANDAGES/DRESSINGS) ×10 IMPLANT
DRSG TEGADERM 2-3/8X2-3/4 SM (GAUZE/BANDAGES/DRESSINGS) ×3 IMPLANT
ELECT BLADE 4.0 EZ CLEAN MEGAD (MISCELLANEOUS) ×5
ELECT COATED BLADE 2.86 ST (ELECTRODE) ×5 IMPLANT
ELECT REM PT RETURN 9FT ADLT (ELECTROSURGICAL) ×5
ELECTRODE BLDE 4.0 EZ CLN MEGD (MISCELLANEOUS) ×4 IMPLANT
ELECTRODE REM PT RTRN 9FT ADLT (ELECTROSURGICAL) ×4 IMPLANT
EVACUATOR SILICONE 100CC (DRAIN) IMPLANT
GAUZE SPONGE 4X4 12PLY STRL (GAUZE/BANDAGES/DRESSINGS) IMPLANT
GAUZE SPONGE 4X4 12PLY STRL LF (GAUZE/BANDAGES/DRESSINGS) IMPLANT
GLOVE BIO SURGEON STRL SZ 6 (GLOVE) ×13 IMPLANT
GLOVE BIOGEL PI IND STRL 7.0 (GLOVE) ×4 IMPLANT
GLOVE BIOGEL PI INDICATOR 7.0 (GLOVE) ×2
GLOVE SURG SS PI 6.5 STRL IVOR (GLOVE) ×3 IMPLANT
GOWN STRL REUS W/ TWL LRG LVL3 (GOWN DISPOSABLE) ×8 IMPLANT
GOWN STRL REUS W/TWL LRG LVL3 (GOWN DISPOSABLE) ×10
IMPL BREAST FULL SHL 485 (Breast) ×2 IMPLANT
IMPL BRST FULL SHL 485CC (Breast) ×4 IMPLANT
IMPLANT BREAST GEL 485CC (Breast) ×5 IMPLANT
IV NS 500ML (IV SOLUTION)
IV NS 500ML BAXH (IV SOLUTION) IMPLANT
KIT FILL SYSTEM UNIVERSAL (SET/KITS/TRAYS/PACK) IMPLANT
MARKER SKIN DUAL TIP RULER LAB (MISCELLANEOUS) IMPLANT
NDL HYPO 25X1 1.5 SAFETY (NEEDLE) IMPLANT
NEEDLE HYPO 25X1 1.5 SAFETY (NEEDLE) IMPLANT
NS IRRIG 1000ML POUR BTL (IV SOLUTION) ×5 IMPLANT
PACK BASIN DAY SURGERY FS (CUSTOM PROCEDURE TRAY) ×5 IMPLANT
PENCIL BUTTON HOLSTER BLD 10FT (ELECTRODE) ×5 IMPLANT
PIN SAFETY STERILE (MISCELLANEOUS) IMPLANT
SHEET MEDIUM DRAPE 40X70 STRL (DRAPES) ×5 IMPLANT
SIZER BREAST REUSE 485CC (SIZER) ×5
SIZER BREAST REUSE 495CC (SIZER) ×5
SIZER BRST REUSE 485CC (SIZER) ×2 IMPLANT
SIZER BRST REUSE 495CC (SIZER) ×2 IMPLANT
SLEEVE SCD COMPRESS KNEE MED (MISCELLANEOUS) ×5 IMPLANT
SPONGE LAP 18X18 X RAY DECT (DISPOSABLE) ×10 IMPLANT
STAPLER VISISTAT 35W (STAPLE) ×5 IMPLANT
STRIP CLOSURE SKIN 1/2X4 (GAUZE/BANDAGES/DRESSINGS) IMPLANT
SUT ETHILON 2 0 FS 18 (SUTURE) IMPLANT
SUT MNCRL AB 4-0 PS2 18 (SUTURE) ×10 IMPLANT
SUT PDS AB 2-0 CT2 27 (SUTURE) IMPLANT
SUT VIC AB 3-0 PS1 18 (SUTURE) ×10
SUT VIC AB 3-0 PS1 18XBRD (SUTURE) ×6 IMPLANT
SUT VIC AB 3-0 SH 27 (SUTURE) ×10
SUT VIC AB 3-0 SH 27X BRD (SUTURE) ×8 IMPLANT
SUT VICRYL 4-0 PS2 18IN ABS (SUTURE) ×10 IMPLANT
SYR BULB IRRIGATION 50ML (SYRINGE) ×10 IMPLANT
SYR CONTROL 10ML LL (SYRINGE) IMPLANT
TAPE MEASURE VINYL STERILE (MISCELLANEOUS) ×5 IMPLANT
TOWEL OR 17X24 6PK STRL BLUE (TOWEL DISPOSABLE) ×10 IMPLANT
TUBE CONNECTING 20X1/4 (TUBING) ×11 IMPLANT
UNDERPAD 30X30 (UNDERPADS AND DIAPERS) ×10 IMPLANT
YANKAUER SUCT BULB TIP NO VENT (SUCTIONS) ×5 IMPLANT

## 2017-04-17 NOTE — Op Note (Signed)
Operative Note   DATE OF OPERATION: 9.21.18  LOCATION: [facility]-outpatient  SURGICAL DIVISION: Plastic Surgery  PREOPERATIVE DIAGNOSES:  1. History bilateral breast cancer 2. Acquired absence left breast 3. History therapeutic radiation 4. Asymmetry native and reconstructed breast  POSTOPERATIVE DIAGNOSES:  same  PROCEDURE:  1. Removal left tissue expander and placement silicone implant 2. Right mastopexy  SURGEON: Sabrina Limbo MD MBA  ASSISTANT: none  ANESTHESIA:  General.   EBL: 50 ml  COMPLICATIONS: None immediate.   INDICATIONS FOR PROCEDURE:  The patient, Sabrina Tapia, is a 66 y.o. female born on 1950-09-13, is here for second stage breast reconstruction. She has a history of bilateral breast cancer and has undergone right lumpectomy followed by left nipple sparing mastectomy with immediate expander acellular dermis reconstruction. She received adjuvant radiation to bilateral chest and is now 6 months post completion radiation.   FINDINGS: [name] Smooth Round Full Projection 485 ml implant placed in left chest, REF [id number]. Right mastopexy skin only, specimen not weighed.  DESCRIPTION OF PROCEDURE:  The patient's operative site was marked with the patient in the preoperative area. The patient was taken to the operating room. SCDs were placed and IV antibiotics were given. The patient's operative site was prepped and draped in a sterile fashion. A time out was performed and all information was confirmed to be correct.  Incision made in left chest inframammary scar and carried through superficial fascia to implant capsule. Expander removed. Examination cavity with well incorporated acellular dermis, thick capsule noted along inframammary fold. Capsulotomies performed medially and scoring of anterior capsule completed. Limited capsulectomy performed along inframammary fold to lower fold to be symmetric with right. Sizer placed. Patient brought to  upright sitting position and right breast mastopexy tailor tacked. An Inspira 485 ml Full projection implant selected. Patient returned to supine position.  Over right breast, Wise pattern resection mastopexy designed with 8 cm vertical limbs and preservation superior dermal glandular pedicle. Remainder area de epithelialized and skin flaps developed until tension free closure achieved. Patient return to sitting position to assess for symmetry. Returned to supine position and both chest cavities irrigated with solution containing Ancef, gentamicin, and bacitracin. Hemostasis ensured. Closure right breast completed with interrupted 3-0 vicrylin dermis along IMF and vertical lime. NAC inset with interrupted 4-0 vicryl. Skin closure completed with 4-0 monocryl subcuticular.  Over left chest, cavity irrigated with Betadine. Implant placed and orientation confirmed. Closure completed with interrupted 3-0 vicryl in superficial fascia followed by 4-0 vicryl in dermis, 4-0 monocryl subcuticular skin closure. Tissue adhesive applied to bilateral chest. Dry dressing and soft compression bra applied.   The patient was allowed to wake from anesthesia, extubated and taken to the recovery room in satisfactory condition.   SPECIMENS: right mastopexy  DRAINS: none  Sabrina Limbo, MD [facility] Plastic & Reconstructive Surgery [phone], pin [phone]

## 2017-04-17 NOTE — Anesthesia Preprocedure Evaluation (Addendum)
Anesthesia Evaluation  Patient identified by MRN, date of birth, ID band Patient awake    Reviewed: Allergy & Precautions, NPO status , Patient's Chart, lab work & pertinent test results  History of Anesthesia Complications Negative for: history of anesthetic complications  Airway Mallampati: I  TM Distance: >3 FB Neck ROM: Full    Dental  (+) Dental Advisory Given, Caps   Pulmonary COPD,  COPD inhaler, former smoker,    breath sounds clear to auscultation       Cardiovascular (-) anginanegative cardio ROS   Rhythm:Regular Rate:Normal     Neuro/Psych Depression negative neurological ROS     GI/Hepatic Neg liver ROS, GERD  Medicated and Controlled,  Endo/Other  negative endocrine ROS  Renal/GU negative Renal ROS     Musculoskeletal  (+) Arthritis ,   Abdominal   Peds  Hematology negative hematology ROS (+)   Anesthesia Other Findings Breast cancer: surgery, XRT  Reproductive/Obstetrics                            Anesthesia Physical Anesthesia Plan  ASA: II  Anesthesia Plan: General   Post-op Pain Management:    Induction: Intravenous  PONV Risk Score and Plan: 4 or greater and Ondansetron, Dexamethasone, Midazolam and Scopolamine patch - Pre-op  Airway Management Planned: Oral ETT  Additional Equipment:   Intra-op Plan:   Post-operative Plan: Extubation in OR  Informed Consent: I have reviewed the patients History and Physical, chart, labs and discussed the procedure including the risks, benefits and alternatives for the proposed anesthesia with the patient or authorized representative who has indicated his/her understanding and acceptance.   Dental advisory given  Plan Discussed with: CRNA and Surgeon  Anesthesia Plan Comments: (Plan routine monitors, GETA)        Anesthesia Quick Evaluation

## 2017-04-17 NOTE — H&P (Addendum)
Subjective:     Patient ID: Sabrina Tapia is a 66 y.o. female.  HPI  9 months post op. Here for implant exchange and right breast symmetry procedure.  Presented following screening MMG 02/2016. In the UOQ of the right breast there was an area of architectural distortion, by [place] this measured 0.5 cm. The right axilla was sonographically benign. Biopsy of the right breast mass showed IDC, ER/PR+, Her2 -. On 03/26/2016 she underwent right lumpectomy and SLN with pathology 1/4 cm IDC, margins clear, and a macrometastatic deposit of ductal carcinoma in the single SLN. A separate 0.6 cm ILC was notedfocally involving the final right medial margin.   Breast MRI obtained post lumpectomy 2 enhancing masses posterior to the lumpectomy cavity, the larger measuring 1.0 cm.  One of these was biopsied and showed benign lymph node tissue. The MRI showed no evidence of additional disease in the RIGHT breast. In the LEFT breast centrally there was a 1.3 cm enhancing mass; biopsy of this was IDC. Biopsy of a second LEFT breast mass not seen on MRI, UOQ showed an IDC ER+/PR- , HER-2 -. Biopsy of LEFT axilla LN with IDC, ER/PR-.  She subsequently underwent left lumpectomy and SLN. Pathology with 1.5 cm IDC,  2/4 lymph nodes +  with positive margins. Underwent reexcision and this showed IDC still present at margins . Mastectomy pathology with multiple foci IDC spanning at least 0.6 cm, LV invasion identified, margins negative. Nipple biopsy specimen with mulitple foci IDC, largest 0.7 cm and invasive disease focally 0.1 cm to anterior margin. Final specimen labeled Left Superior Margin with multiple foci IDC spanning at least 0.8 cm with invasive carcinoma present at inferior margin.- These have been reviewed with Dr. Donne Hazel and inferior margin continuous with mastectomy specimen and no further excision recommended.  Mammaprint bilateral low risk. Completed bilateral XRT with capecitabine sensitization to her  radiation treatments 3.5.18.  Prior to lumpectomies 38 C. Left mastectomy 305 g, weight lumpectomies not available.  Retired from [employer]. Lives alone.     Objective:   Physical Exam  Neck:  Right neck with 0.2 cm nodular area without TTP no erythema  Cardiovascular: Normal rate, regular rhythm and normal heart sounds.   Pulmonary/Chest: Effort normal and breath sounds normal.   Right UOQ small depression in area of lumpectomy Chest:  left lower border expander is appr 1 cm above IMF scar SN to nipple R 23 L 22 cm BW R 17 L 14 cm Nipple to IMF R 7 L 6 (6 cm to lower border expander, 7 cm to IMF scar)  Assessment:     R breast ca UOQ s/p lumpectomy, SLN, reexcision for margins L breast ca UOQ s/p lumpectomy, SLN, reexcision for margins with continued positive margins S/p left NSM, TE/ADM (Alloderm) reconstruction Adjuvant bilateral chest radiation Sebaceous cyst neck    Plan:     She is now 6 months post end XRT.   Plan removal left TE and placement implant. Discussed saline vs silicone, round vs anatomic, smooth vs textured. Discussed incidence ALCL with textured devices, all anatomic are textured. Reviewed silicone capacity filled may offer less rippling but discussed MRI surveillance implants. Reviewed both saline and silicone risks of CC increased given radiation, risks infection requiring removal, risks extrusion, rupture. Plan smooth round silicone.  Plan opposite breast mastopexy. Counseled this will not alleviate her concern depression Right UOQ lumpectomy site. Discussed small implant to achieve more upper pole fullness or if she desires additional volume. Reviewed  again risks contracture increased given radiation. At this time plan mastopexy alone. Patient desires more symmetry- reviewed with unilateral implant reconstruction will always have asymmetry without bra and goal to achieve symmetric volume and cleavage in bra. She is satisfied with right  breast volume and goal to achieve symmetry with this regards to volume. Cannot assure cup size.  Reviewed with XRT 24% risk complications with reconstruction inc contracture and wound healing problems. Could elect for LD flap for coverage implant- she again declines this and we reviewed that this would always be our back up plan if any problems incurred.  Right neck sebaceous cyst has diminished in size since last exam and not bothering her, will defer this today.   Plan OP surgery.  Natrelle 133MX-12-T 400 ml tissue expander placed,   415 ml total fill volume.   Irene Limbo, MD [facility] [phone], pin [phone]

## 2017-04-17 NOTE — Anesthesia Postprocedure Evaluation (Signed)
Anesthesia Post Note  Patient: Sabrina Tapia  Procedure(s) Performed: Procedure(s) (LRB): REMOVAL OF LEFT TISSUE EXPANDER WITH PLACEMENT OF LEFT SILICONE BREAST IMPLANT (Left) RIGHT MASTOPEXY (Right)     Patient location during evaluation: PACU Anesthesia Type: General Level of consciousness: awake and alert, patient cooperative and oriented Pain management: pain level controlled Vital Signs Assessment: post-procedure vital signs reviewed and stable Respiratory status: spontaneous breathing, nonlabored ventilation and respiratory function stable Cardiovascular status: blood pressure returned to baseline and stable Postop Assessment: no apparent nausea or vomiting Anesthetic complications: no    Last Vitals:  Vitals:   04/17/17 1104 04/17/17 1136  BP:  121/66  Pulse: 82 74  Resp: 20 16  Temp:  36.7 C  SpO2: 98% 99%    Last Pain:  Vitals:   04/17/17 1136  TempSrc:   PainSc: 3                  Sabrina Tapia,E. Caitlan Chauca

## 2017-04-17 NOTE — Anesthesia Procedure Notes (Signed)
Procedure Name: Intubation Date/Time: 04/17/2017 7:43 AM Performed by: Justice Rocher Pre-anesthesia Checklist: Patient identified, Emergency Drugs available, Suction available and Patient being monitored Patient Re-evaluated:Patient Re-evaluated prior to induction Oxygen Delivery Method: Circle system utilized Preoxygenation: Pre-oxygenation with 100% oxygen Induction Type: IV induction Ventilation: Mask ventilation without difficulty Laryngoscope Size: Mac and 4 Grade View: Grade I Tube type: Oral Tube size: 7.0 mm Number of attempts: 1 Airway Equipment and Method: Stylet and Oral airway Placement Confirmation: ETT inserted through vocal cords under direct vision,  positive ETCO2 and breath sounds checked- equal and bilateral Secured at: 23 cm Tube secured with: Tape Dental Injury: Teeth and Oropharynx as per pre-operative assessment

## 2017-04-17 NOTE — Transfer of Care (Signed)
Immediate Anesthesia Transfer of Care Note  Patient: Sabrina Tapia  Procedure(s) Performed: Procedure(s) (LRB): REMOVAL OF LEFT TISSUE EXPANDER WITH PLACEMENT OF LEFT SILICONE BREAST IMPLANT (Left) RIGHT MASTOPEXY (Right)  Patient Location: PACU  Anesthesia Type: General  Level of Consciousness: awake, sedated, patient cooperative and responds to stimulation  Airway & Oxygen Therapy: Patient Spontanous Breathing and Patient connected to face mask oxygen  Post-op Assessment: Report given to PACU RN, Post -op Vital signs reviewed and stable and Patient moving all extremities  Post vital signs: Reviewed and stable  Complications: No apparent anesthesia complications

## 2017-04-17 NOTE — Discharge Instructions (Signed)

## 2017-04-20 ENCOUNTER — Encounter (HOSPITAL_BASED_OUTPATIENT_CLINIC_OR_DEPARTMENT_OTHER): Payer: Self-pay | Admitting: Plastic Surgery

## 2017-04-23 DIAGNOSIS — Z853 Personal history of malignant neoplasm of breast: Secondary | ICD-10-CM | POA: Insufficient documentation

## 2017-06-15 ENCOUNTER — Ambulatory Visit: Payer: BC Managed Care – PPO | Admitting: Allergy & Immunology

## 2017-06-15 DIAGNOSIS — M7662 Achilles tendinitis, left leg: Secondary | ICD-10-CM | POA: Insufficient documentation

## 2017-06-15 DIAGNOSIS — M2021 Hallux rigidus, right foot: Secondary | ICD-10-CM | POA: Insufficient documentation

## 2017-06-29 ENCOUNTER — Encounter: Payer: Self-pay | Admitting: Allergy & Immunology

## 2017-06-29 ENCOUNTER — Ambulatory Visit: Payer: Medicare Other | Admitting: Allergy & Immunology

## 2017-06-29 VITALS — BP 128/78 | HR 78 | Ht 65.0 in | Wt 157.8 lb

## 2017-06-29 DIAGNOSIS — J4540 Moderate persistent asthma, uncomplicated: Secondary | ICD-10-CM | POA: Insufficient documentation

## 2017-06-29 DIAGNOSIS — B999 Unspecified infectious disease: Secondary | ICD-10-CM | POA: Diagnosis not present

## 2017-06-29 DIAGNOSIS — J302 Other seasonal allergic rhinitis: Secondary | ICD-10-CM

## 2017-06-29 MED ORDER — MONTELUKAST SODIUM 10 MG PO TABS: 10.0000 mg | ORAL_TABLET | Freq: Every day | ORAL | 2 refills | Status: DC

## 2017-06-29 NOTE — Progress Notes (Signed)
FOLLOW UP  Date of Service/Encounter:  06/29/17   Assessment:   Moderate persistent asthma, uncomplicated  Seasonal allergic rhinitis   Recurrent thrush - but only in the presence of inhaled steroids   Asthma Reportables:  Severity: moderate persistent  Risk: low Control: well controlled  Plan/Recommendations:   1. Mild persistent asthma, uncomplicated - Lung testing looked great today despite being on minimal controller medications.  - We will add on montelukast (Singulair) in February prior to the start of your bad season. - Daily controller medication(s): Singulair 10mg  daily and Spiriva 1.28mcg two puffs once daily (start February 2019) - Prior to physical activity: ProAir 2 puffs 10-15 minutes before physical activity. - Rescue medications: ProAir 4 puffs every 4-6 hours as needed - Asthma control goals:  * Full participation in all desired activities (may need albuterol before activity) * Albuterol use two time or less a week on average (not counting use with activity) * Cough interfering with sleep two time or less a month * Oral steroids no more than once a year * No hospitalizations  2. Seasonal allergic rhinitis - Continue with Dymista 2 sprays per nostril 1-2 times daily as needed.  - Continue with cetirizine 10mg  daily and nasal saline rinses 1-2 times daily.   3. Recurrent infections  - She has had no problems with other infections and her thrush has only been a problem with inhaled corticosteroids. - Therefore we will defer further workup at this time.   4. Return in about 3 months (around 09/27/2017).  Subjective:   Sabrina Tapia is a 66 y.o. female presenting today for follow up of  Chief Complaint  Patient presents with  . Follow-up    Pt presents to follow up on asthma.    Sabrina Tapia has a history of the following: Patient Active Problem List   Diagnosis Date Noted  . Moderate persistent asthma, uncomplicated 44/81/8563  . Mild  persistent asthma, uncomplicated 14/97/0263  . Other seasonal allergic rhinitis 02/12/2017  . Malignant neoplasm of overlapping sites of left breast in female, estrogen receptor positive ([place]) 10/27/2016  . Sinusitis 09/01/2016  . Hyponatremia 09/01/2016  . Malignant neoplasm of upper-outer quadrant of right breast in female, estrogen receptor positive ([place]) 04/19/2016  . Acid reflux 11/20/2015  . Bunion of great toe of right foot 04/04/2015  . Hemorrhoid 11/23/2014  . Anal skin tag 11/23/2014  . Anal burning 11/23/2014  . Plantar fasciitis, right 05/11/2014  . Retrocalcaneal bursitis 05/11/2014  . Pain in joint, ankle and foot 05/11/2014  . Heart burn   . Joint pain   . Abnormal Pap smear   . Osteopenia     History obtained from: chart review and patient.  [place] Primary Care Provider is Josetta Huddle, MD.     Sabrina Tapia is a 66 y.o. female presenting for a follow up visit.  She was last seen in July 2018.  At that time, she was reporting thrush to multiple controller medications.  We instead changed her to Symbicort 160/4.5 mcg 2 puffs twice daily with a spacer.  For her allergic rhinitis, we continued her on Dymista 2 sprays per nostril 1-2 times daily as well as cetirizine 10 mg daily with nasal saline rinses.  At the time, she had oral candidiasis.  We sent in a prescription for fluconazole 100 mg x1 to complete her treatment.  We also gave her a Pneumovax vaccine.  Since the last visit, she has actually done fairly well.  She actually felt much better with the Symbicort, but again developed thrush, which is been a common symptom with all of her controller medications.  This is despite her washing her mouth and brushing her teeth after using the inhalers.  She has had thrush with Qvar, Breo, and now Symbicort.  She tells me today that she has done very well with Flovent, although in my last note it does say that she developed thrush with Flovent as well.  Because of this, she  decided to stop all of her inhalers and has actually done quite well.  She has used Flovent intermittently with increased respiratory symptoms.  It would just be one day at a time, however.  Her worst time of the year is March through June, therefore she is worried which she is going to do in the spring.  She has rarely needed her rescue inhaler since the last visit.  Her ACT score today is 23, indicating excellent asthma control. Review of her previous medications reveal that she has never tried Singulair or Spiriva for her asthma.  For her allergic rhinitis, she remains on cetirizine 10 mg daily.  She does have Dymista, which she uses only as needed.  She never got a prescription for the Dymista and has just been surviving on her samples.  She has had no further problems with thrush since stopping her controller medications. She has no problems with other infections.   Otherwise, there have been no changes to her past medical history, surgical history, family history, or social history.  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     Review of Systems: a 14-point review of systems is pertinent for what is mentioned in HPI.  Otherwise, all other systems were negative. Constitutional: negative other than that listed in the HPI Eyes: negative other than that listed in the HPI Ears, nose, mouth, throat, and face: negative other than that listed in the HPI Respiratory: negative other than that listed in the HPI Cardiovascular: negative other than that listed in the HPI Gastrointestinal: negative other than that listed in the HPI Genitourinary: negative other than that listed in the HPI Integument: negative other than that listed in the HPI Hematologic: negative other than that listed in the HPI Musculoskeletal: negative other than that listed in the HPI Neurological: negative other than that listed in the HPI Allergy/Immunologic: negative other than that listed  in the HPI    Objective:   Blood pressure 128/78, pulse 78, height 5\' 5"  (1.651 m), weight 157 lb 12.8 oz (71.6 kg), last menstrual period 07/28/1996, SpO2 95 %. Body mass index is 26.26 kg/m.   Physical Exam:  General: Alert, interactive, in no acute distress. Very pleasant and interactive.  Eyes: No conjunctival injection bilaterally, no discharge on the right, no discharge on the left and no Horner-Trantas dots present. PERRL bilaterally. EOMI without pain. No photophobia.  Ears: Right TM pearly gray with normal light reflex, Left TM pearly gray with normal light reflex, Right TM intact without perforation and Left TM intact without perforation.  Nose/Throat: External nose within normal limits and septum midline. Turbinates edematous without discharge. Posterior oropharynx mildly erythematous without cobblestoning in the posterior oropharynx. Tonsils 2+ without exudates.  Tongue without thrush. Adenopathy: no enlarged lymph nodes appreciated in the anterior cervical, occipital, axillary, epitrochlear, inguinal, or popliteal regions. Lungs: Clear to auscultation without wheezing, rhonchi or rales. No increased work of breathing. CV: Normal S1/S2. No  murmurs. Capillary refill <2 seconds.  Skin: Warm and dry, without lesions or rashes. Neuro:   Grossly intact. No focal deficits appreciated. Responsive to questions.  Diagnostic studies:  Spirometry: results normal (FEV1: 2.66/114%, FVC: 3.45/115%, FEV1/FVC: 77%).    Spirometry consistent with normal pattern.   Allergy Studies: none    Salvatore Marvel, MD [facility]

## 2017-06-29 NOTE — Patient Instructions (Addendum)
1. Mild persistent asthma, uncomplicated - Lung testing looked great today despite being on minimal controller medications.  - We will add on montelukast (Singulair) in February prior to the start of your bad season. - Daily controller medication(s): Singulair 10mg  daily and Spiriva 1.37mcg two puffs once daily (start February 2019) - Prior to physical activity: ProAir 2 puffs 10-15 minutes before physical activity. - Rescue medications: ProAir 4 puffs every 4-6 hours as needed - Asthma control goals:  * Full participation in all desired activities (may need albuterol before activity) * Albuterol use two time or less a week on average (not counting use with activity) * Cough interfering with sleep two time or less a month * Oral steroids no more than once a year * No hospitalizations  2. Seasonal allergic rhinitis - Continue with Dymista 2 sprays per nostril 1-2 times daily as needed.  - Continue with cetirizine 10mg  daily and nasal saline rinses 1-2 times daily.   3. Return in about 3 months (around 09/27/2017).   Please inform us of any Emergency Department visits, hospitalizations, or changes in symptoms. Call us before going to the ED for breathing or allergy symptoms since we might be able to fit you in for a sick visit. Feel free to contact us anytime with any questions, problems, or concerns.  It was a pleasure to see you again today! Enjoy the holiday season!  Websites that have reliable patient information: 1. American Academy of Asthma, Allergy, and Immunology: www.aaaai.org 2. Food Allergy Research and Education (FARE): foodallergy.org 3. Mothers of Asthmatics: http://www.asthmacommunitynetwork.org 4. American College of Allergy, Asthma, and Immunology: www.acaai.org

## 2017-07-13 ENCOUNTER — Other Ambulatory Visit: Payer: Self-pay | Admitting: Oncology

## 2017-08-07 ENCOUNTER — Other Ambulatory Visit: Payer: Self-pay | Admitting: Oncology

## 2017-08-20 ENCOUNTER — Telehealth: Payer: Self-pay | Admitting: Oncology

## 2017-08-20 NOTE — Telephone Encounter (Signed)
Spoke with patient to R/S 2/6 appointment per voicemail from 1/23

## 2017-09-02 ENCOUNTER — Ambulatory Visit: Payer: Self-pay | Admitting: Oncology

## 2017-09-02 ENCOUNTER — Other Ambulatory Visit: Payer: Self-pay

## 2017-09-03 ENCOUNTER — Ambulatory Visit: Payer: Self-pay | Admitting: Oncology

## 2017-09-03 ENCOUNTER — Other Ambulatory Visit: Payer: Self-pay

## 2017-09-07 NOTE — Progress Notes (Signed)
Sabrina Tapia  [phone] Fax:([phone]     ID: Sabrina Tapia DOB: 09-17-1950  MR#: [id number]  LTJ#:[id number]  Patient Care Team: Josetta Huddle, MD as PCP - General (Internal Medicine) Rolm Bookbinder, MD as Consulting Physician (General Surgery) Gerold Sar, Virgie Dad, MD as Consulting Physician (Oncology) Phineas Real, Belinda Block, MD as Consulting Physician (Gynecology) Delice Bison, Charlestine Massed, NP as Nurse Practitioner (Hematology and Oncology) OTHER MD:  CHIEF COMPLAINT: Estrogen receptor positive breast cancer  CURRENT TREATMENT: Tamoxifen   BREAST CANCER HISTORY: From the earlier summary note:  "Sabrina Tapia" had screening mammography at her gynecologist's office suggestive of a change in the upper-outer quadrant of the right breast. She was referred to [facility] where on 03/04/2016 she underwent bilateral diagnostic mammography and right breast ultrasonography. The breast density was category C. In the upper outer quadrant of the right breast there was a new area of architectural distortion. By ultrasonography this measured 0.5 cm. The right axilla was reported as sonographically benign.  On 03/06/2016 she underwent core needle biopsy of the right breast mass in question, and this showed ([id number]) an invasive ductal carcinoma, grade 1, estrogen receptor 95% positive, progesterone receptor 95% positive, both with strong staining intensity, with an MIB-1 of 12%, and no HER-2 amplification, the signals ratio being 1.54 and the number per cell 2.15.  Her case was presented in the multidisciplinary breast cancer conference 03/12/2016.Marland Kitchen At that time a preliminary plan was proposed: Breast conserving surgery with sentinel lymph node sampling, no Oncotype if the tumor was no larger than 5 mm, and consideration of genetics testing.  On 03/26/2016 Sabrina Tapia underwent right lumpectomy and sentinel lymph node sampling. This showed ([id number]) an invasive ductal carcinoma  measuring 1.4 cm, grade 1, with negative margins, and a macrometastatic deposit of ductal carcinoma in the single sentinel lymph node. Mammaprint from this tumor was read as "low risk".  However there was a separate invasive lobular carcinoma measuring 0.6 cm,  focally involving the final right medial margin. Because of the lobular breast cancer bilateral breast MRI was obtained 04/04/2016. This showed in the right breast a 7.4 x 4.5 cm seroma with 2 enhancing masses posterior to the lumpectomy cavity, the larger measuring 1.0 cm. These were felt possibly to be reactive lymph nodes. One of these lymph nodes was biopsied 04/09/2016 and this showed ([id number]) benign lymph node tissue. The MRI showed no evidence of additional disease in the right breast.  However in the left breast centrally there was a 1.3 cm enhancing mass. Biopsy of this left breast mass 04/09/2016 ([id number]) showed an invasive ductal carcinoma, grade 1 or 2, with insufficient tissue for a prognostic panel. On 04/11/2016 biopsy of a second left breast mass, upper outer quadrant, 4.6 cm a way from the other left breast mass, showed ([id number]) an invasive ductal carcinoma, grade 2, estrogen receptor 10% positive with strong staining intensity, progesterone receptor negative, with an MIB-1 of 2%, and no HER-2 amplification, the signals ratio being 1.50 and the number per cell 2.10. The proliferation fraction was 2%.  On 04/14/2016 Sabrina Tapia underwent biopsy of a suspicious left axillary lymph node and this ([id number]) was positive for invasive ductal carcinoma, estrogen and progesterone receptor negative, with an MIB-1 of 10%. I do not find that HER-2 was repeated. Mammaprint from this tumor also came back low risk.  Her subsequent history is as detailed below.  INTERVAL HISTORY: Sabrina Tapia returns today for follow-up and treatment of her estrogen receptor positive breast  cancer. She continues on tamoxifen, with good tolerance. She  notes that she occasionally has hot flashes. She denies an issue with vaginal wetness.  Her last mammogram was on 04/08/2017 at [facility]. The results are unavailable in epic but were benign per patient  REVIEW OF SYSTEMS: Sabrina Tapia reports that she had breast reconstruction and she is overall cosmetically pleased with the results. She notes that she is not yet comfortable wearing bras. For exercise, she goes to yoga and piliates. She notes that she runs out of breath easily. She also sees a trainer, and she notes that she is sometimes unable to complete some of the cardio exercise. She notes that she also stretches her mucles. She would like to play golf once the weather is nice again. She notes that she has arthritis, and she attributes this to the tamoxifen. She notes that she was sedentary during the summer 2018 due to recovering from reconstruction surgery. She denies unusual headaches, visual changes, nausea, vomiting, or dizziness. There has been no unusual cough, phlegm production, or pleurisy. This been no change in bowel or bladder habits. She denies unexplained fatigue or unexplained weight loss, bleeding, rash, or fever. A detailed review of systems was otherwise stable.    PAST MEDICAL HISTORY: Past Medical History:  Diagnosis Date  . Allergy-induced asthma    prn inhaler  . Asthma    allergy induced  . Bruises easily   . Dental crowns present   . Family history of adverse reaction to anesthesia    pt's father has hx. of being hard to wake up post-op  . GERD (gastroesophageal reflux disease)   . Hemorrhoids   . History of breast cancer 2017   bilateral  . History of radiation therapy    08/19/16- 09/29/16, Right Breast 50 Gy in 25 fractions, Right Breast boost 10 Gy in 5 fractions, Left Breast 50.4 Gy in 28 fractions  . Immature cataract   . Osteoarthritis    hands and feet  . Osteopenia 02/2016   T score -1.3 FRAX 8.3%/0.7% stable from prior DEXA  . Overactive bladder      PAST SURGICAL HISTORY: Past Surgical History:  Procedure Laterality Date  . BREAST RECONSTRUCTION WITH PLACEMENT OF TISSUE EXPANDER AND FLEX HD (ACELLULAR HYDRATED DERMIS) Left 07/01/2016   Procedure: BREAST RECONSTRUCTION WITH PLACEMENT OF TISSUE EXPANDER AND  ALLODERM;  Surgeon: Irene Limbo, MD;  Location: [place];  Service: Plastics;  Laterality: Left;  . INCISION AND DRAINAGE Left 06/02/2016   Procedure: DRAINAGE of left axilla seroma;  Surgeon: Rolm Bookbinder, MD;  Location: [place];  Service: General;  Laterality: Left;  Marland Kitchen MASTOPEXY Right 04/17/2017   Procedure: RIGHT MASTOPEXY;  Surgeon: Irene Limbo, MD;  Location: [place];  Service: Plastics;  Laterality: Right;  . NIPPLE SPARING MASTECTOMY Left 07/01/2016   Procedure: LEFT NIPPLE SPARING MASTECTOMY;  Surgeon: Rolm Bookbinder, MD;  Location: [place];  Service: General;  Laterality: Left;  . RADIOACTIVE SEED GUIDED PARTIAL MASTECTOMY WITH AXILLARY SENTINEL LYMPH NODE BIOPSY Right 03/26/2016   Procedure: RIGHT BREAST LUMPECTOMY WITH RADIOACTIVE SEED AND SENTINEL LYMPH NODE BIOPSY;  Surgeon: Rolm Bookbinder, MD;  Location: [place];  Service: General;  Laterality: Right;  . RADIOACTIVE SEED GUIDED PARTIAL MASTECTOMY WITH AXILLARY SENTINEL LYMPH NODE BIOPSY Left 05/20/2016   Procedure: LEFT BREAST RADIOACTIVE SEED (two seeds) GUIDED LUMPECTOMY WITH LEFT AXILLARY SENTINEL LYMPH NODE BIOPSY AND LEFT SEED TARGETED AXILLARY LYMPH NODE EXCISION;  Surgeon: Rolm Bookbinder, MD;  Location: [place];  Service: General;  Laterality: Left;  . RE-EXCISION OF BREAST CANCER,SUPERIOR MARGINS Right 05/20/2016   Procedure: RE-EXCISION OF RIGHT BREAST MARGIN;  Surgeon: Rolm Bookbinder, MD;  Location: [place];  Service: General;  Laterality: Right;  . RE-EXCISION OF BREAST CANCER,SUPERIOR MARGINS Left 06/02/2016    Procedure: RE-EXCISION OF BREAST CANCER,SUPERIOR MARGINS;  Surgeon: Rolm Bookbinder, MD;  Location: [place];  Service: General;  Laterality: Left;  . REMOVAL OF TISSUE EXPANDER AND PLACEMENT OF IMPLANT Left 04/17/2017   Procedure: REMOVAL OF LEFT TISSUE EXPANDER WITH PLACEMENT OF LEFT SILICONE BREAST IMPLANT;  Surgeon: Irene Limbo, MD;  Location: [place];  Service: Plastics;  Laterality: Left;    FAMILY HISTORY Family History  Problem Relation Age of Onset  . Hypertension Mother   . Heart disease Mother   . Lung disease Mother   . Hypertension Father   . Cancer Father        colon  . Heart disease Father   . Lung cancer Father   . Anesthesia problems Father        hard to wake up post-op  . Breast cancer Paternal Grandmother        Age 35's  . Hypertension Brother   . Hyperlipidemia Brother   . Diabetes Maternal Grandfather   The patient's father died from lung cancer at the age of 16. He also had a remote history of colon cancer. His mother had a history of breast cancer in her 43s. The patient's mother died at age 70. The patient has one brother, no sisters.  GYNECOLOGIC HISTORY:  Patient's last menstrual period was 07/28/1996 (approximate). Menarche age 69, the patient is GX P0. 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 She lives alone, with no pets.    ADVANCED DIRECTIVES: In place. She has named her brother Marya Amsler as her healthcare part of attorney. He can be reached at [phone]   HEALTH MAINTENANCE: Social History   Tobacco Use  . Smoking status: Former Smoker    Last attempt to quit: 07/27/1990    Years since quitting: 27.1  . Smokeless tobacco: Never Used  Substance Use Topics  . Alcohol use: Yes    Comment: 2 beers daily  . Drug use:  No     Colonoscopy:February 2016  PAP:  Bone density: 03/04/2016/osteopenia   Allergies  Allergen Reactions  . Hydrocodone Other (See Comments)    Restlessness, "energy"  . Adhesive [Tape] Other (See Comments)    SKIN IRRITATION  . Augmentin [Amoxicillin-Pot Clavulanate] Itching    Current Outpatient Medications  Medication Sig Dispense Refill  . albuterol (PROAIR HFA) 108 (90 Base) MCG/ACT inhaler Inhale 2 puffs into the lungs every 4 (four) hours as needed for wheezing or shortness of breath. 1 Inhaler 1  . Azelastine-Fluticasone (DYMISTA) 137-50 MCG/ACT SUSP Use 2 sprays into each nostril 1-2 times daily. 3 Bottle 1  . CALCIUM-MAGNESIUM PO Take by mouth.    . cetirizine (ZYRTEC) 10 MG tablet Take 10 mg by mouth.    . Cholecalciferol (VITAMIN D) 2000 units tablet Take 4,000 Units by mouth daily.     Marland Kitchen docusate sodium (COLACE) 50 MG capsule Take 100 mg by mouth daily.     Marland Kitchen doxycycline (VIBRAMYCIN) 50  MG capsule Take 1 capsule (50 mg total) by mouth 2 (two) times daily. (Patient not taking: Reported on 06/29/2017) 14 capsule 0  . FLOVENT HFA 110 MCG/ACT inhaler INHALE 1 PUFF BY MOUTH INTO THE LUNGS TWICE DAILY 12 g 0  . hydrocortisone (ANUSOL-HC) 2.5 % rectal cream Place 1 application rectally as needed for hemorrhoids or itching.    . montelukast (SINGULAIR) 10 MG tablet Take 1 tablet (10 mg total) by mouth at bedtime. 30 tablet 2  . OVER THE COUNTER MEDICATION Take by mouth daily. FLORA RESTORE    . OVER THE COUNTER MEDICATION Take by mouth daily. [place]    . OVER THE COUNTER MEDICATION Take by mouth daily. K-12 - STATES HELPS ABSORPTION OF VIT. D    . oxyCODONE (ROXICODONE) 5 MG immediate release tablet Take 1-2 tablets (5-10 mg total) by mouth every 4 (four) hours as needed. (Patient not taking: Reported on 06/29/2017) 30 tablet 0  . PRILOSEC OTC 20 MG tablet Reported on 12/14/2015    . psyllium (METAMUCIL) 58.6 % packet Take 1 packet by mouth daily.    . tamoxifen  (NOLVADEX) 20 MG tablet Take 20 mg by mouth daily.    . tamoxifen (NOLVADEX) 20 MG tablet TAKE 1 TABLET(20 MG) BY MOUTH DAILY 90 tablet 0  . valACYclovir (VALTREX) 1000 MG tablet Take 1,000 mg by mouth at bedtime as needed.    . venlafaxine XR (EFFEXOR-XR) 75 MG 24 hr capsule TAKE 1 CAPSULE(75 MG) BY MOUTH DAILY WITH BREAKFAST 90 capsule 0   No current facility-administered medications for this visit.     OBJECTIVE: Middle-aged white woman in no acute distress  Vitals:   09/08/17 1119  BP: (!) 154/82  Pulse: 69  Resp: 18  Temp: 97.7 F (36.5 C)  SpO2: 100%     Body mass index is 26.06 kg/m.    ECOG FS:0 - Asymptomatic  Sclerae unicteric, pupils round and equal Oropharynx clear and moist No cervical or supraclavicular adenopathy Lungs no rales or rhonchi Heart regular rate and rhythm Abd soft, nontender, positive bowel sounds MSK no focal spinal tenderness, no upper extremity lymphedema Neuro: nonfocal, well oriented, appropriate affect Breasts: The right breast is status post lumpectomy, radiation, and mammo-pack C.  There is no evidence of local recurrence.  The left breast is status post mastectomy with silicone implant reconstruction.  There is some irregularity in the breast contour.  Overall symmetry is fair.  Both axillae are benign  LAB RESULTS:  CMP     Component Value Date/Time   NA 136 03/12/2017 1115   K 4.0 03/12/2017 1115   CO2 25 03/12/2017 1115   GLUCOSE 78 03/12/2017 1115   BUN 12.4 03/12/2017 1115   CREATININE 0.7 03/12/2017 1115   CALCIUM 9.1 03/12/2017 1115   PROT 6.6 03/12/2017 1115   ALBUMIN 3.6 03/12/2017 1115   AST 34 03/12/2017 1115   ALT 33 03/12/2017 1115   ALKPHOS 57 03/12/2017 1115   BILITOT 0.41 03/12/2017 1115    INo results found for: SPEP, UPEP  Lab Results  Component Value Date   WBC 3.9 09/08/2017   NEUTROABS 2.6 09/08/2017   HGB 13.6 09/08/2017   HCT 40.6 09/08/2017   MCV 90.8 09/08/2017   PLT 323 09/08/2017       Chemistry      Component Value Date/Time   NA 136 03/12/2017 1115   K 4.0 03/12/2017 1115   CO2 25 03/12/2017 1115   BUN 12.4 03/12/2017 1115  CREATININE 0.7 03/12/2017 1115      Component Value Date/Time   CALCIUM 9.1 03/12/2017 1115   ALKPHOS 57 03/12/2017 1115   AST 34 03/12/2017 1115   ALT 33 03/12/2017 1115   BILITOT 0.41 03/12/2017 1115       No results found for: LABCA2  No components found for: LABCA125  No results for input(s): INR in the last 168 hours.  Urinalysis    Component Value Date/Time   COLORURINE YELLOW 01/31/2016 [place] 01/31/2016 1526   LABSPEC 1.006 01/31/2016 1526   PHURINE 6.5 01/31/2016 1526   GLUCOSEU NEGATIVE 01/31/2016 1526   HGBUR NEGATIVE 01/31/2016 [place] 01/31/2016 1526   KETONESUR NEGATIVE 01/31/2016 1526   PROTEINUR NEGATIVE 01/31/2016 1526   UROBILINOGEN 0.2 12/11/2014 1506   NITRITE NEGATIVE 01/31/2016 1526   LEUKOCYTESUR TRACE (A) 01/31/2016 1526     STUDIES: Her last mammogram was on 04/08/2017 at [facility]. The results are unavailable.  ELIGIBLE FOR AVAILABLE RESEARCH PROTOCOL: no  ASSESSMENT: 67 y.o. [place] woman status post right breast upper outer quadrant lumpectomy 03/26/2016 for a pT1c pN1, stage IIA invasive ductal carcinoma, grade 1, estrogen and progesterone receptor positive, HER-2 nonamplified, with an MIB-1 of 12%   (a) mammaprint from this tumor was read as "low risk".   (1) a second right breast cancer, invasive lobular, grade 1, measuring 0.6 cm, focally involved the final right medial margin from the 03/26/2016 procedure; this tumor also was estrogen and progesterone receptor positive, HER-2 negative, with an MIB-1 of 3%  (a) medial margin cleared with additional surgery 05/20/2016.  (2) two biopsies from the left breast 04/09/2016 and 04/11/2016, 4.6 cm apart, showed  (a) centrally, an invasive ductal carcinoma, grade 1 or 2, with no prognostic panel  available  (b) in the upper outer quadrant, invasive ductal carcinoma, grade 2, estrogen receptor 10% positive, progesterone receptor and HER-2 negative, with an MIB-1 of 2%  (c) left axillary lymph node biopsy 04/14/2016 showed invasive ductal carcinoma, estrogen and progesterone receptor negative  (d) mammaprint from (2)(c) was also "low risk"  (3) left lumpectomy 05/20/2016 showed a  pT1c pN1 stage IIA  invasive ductal carcinoma, grade 2, estrogen receptor positive, progesterone receptor negative, HER-2 nonamplified, with an MIB-1 of 2%; margins were positive   (4) status post left sided nipple sparing mastectomy with immediate expander placement 07/01/2016 showing multiple foci of residual grade 2 invasive ductal carcinoma, the largest measuring 0.7 cm, with evidence of lymphovascular invasion and multiple close but negative margins  (a) silicone implant placement on 04/17/2017  (5) radiation therapy 08/19/16-03/05/18with capecitabine sensitization Site/dose:   1) Right breast/ 50Gy in 25 fractions                         2) Right breast boost/ 10 Gy in 5 fractions                         3) Left breast 50.4 Gy in 28 fractions   (6) tamoxifen started 04/18/2016--to be continued a minimum of 5 years, likely followed by anastrozole x 2 years   PLAN; Sabrina Tapia is now a year and a half out from definitive surgery for her breast cancer with no evidence of disease recurrence.  This is very favorable.  She is tolerating tamoxifen well.  The plan will be to continue that a minimum of 5 years.  I think she is  still slightly deconditioned.  It did not help that she could not exercise for several months after the implant placement.  I encouraged and commended her current exercise plan and hopefully within the next few months or so she will be able to improve her functional status  I do not think the aches and pain she is having are going to be due to tamoxifen.  They are much more likely to be related  to her year of birth  She will see me again in October, after her September mammography at [facility].  From that point I will see her on a yearly basis  She knows to call for any problems that may develop before then.   Verlean Allport, Virgie Dad, MD  09/08/17 11:35 AM Medical Oncology and Hematology [facility] [address] [place], [place] [address] Tel. [phone]    Fax. [phone]  This document serves as a record of services personally performed by Lurline Del, MD. It was created on his behalf by Sheron Nightingale, a trained medical scribe. The creation of this record is based on the scribe's personal observations and the provider's statements to them.   I have reviewed the above documentation for accuracy and completeness, and I agree with the above.

## 2017-09-08 ENCOUNTER — Telehealth: Payer: Self-pay | Admitting: Oncology

## 2017-09-08 ENCOUNTER — Inpatient Hospital Stay: Payer: Medicare Other | Attending: Oncology

## 2017-09-08 ENCOUNTER — Inpatient Hospital Stay (HOSPITAL_BASED_OUTPATIENT_CLINIC_OR_DEPARTMENT_OTHER): Payer: Medicare Other | Admitting: Oncology

## 2017-09-08 VITALS — BP 154/82 | HR 69 | Temp 97.7°F | Resp 18 | Ht 65.0 in | Wt 156.6 lb

## 2017-09-08 DIAGNOSIS — M129 Arthropathy, unspecified: Secondary | ICD-10-CM | POA: Insufficient documentation

## 2017-09-08 DIAGNOSIS — Z801 Family history of malignant neoplasm of trachea, bronchus and lung: Secondary | ICD-10-CM | POA: Diagnosis not present

## 2017-09-08 DIAGNOSIS — K219 Gastro-esophageal reflux disease without esophagitis: Secondary | ICD-10-CM

## 2017-09-08 DIAGNOSIS — M1990 Unspecified osteoarthritis, unspecified site: Secondary | ICD-10-CM | POA: Diagnosis not present

## 2017-09-08 DIAGNOSIS — C773 Secondary and unspecified malignant neoplasm of axilla and upper limb lymph nodes: Secondary | ICD-10-CM

## 2017-09-08 DIAGNOSIS — N3281 Overactive bladder: Secondary | ICD-10-CM

## 2017-09-08 DIAGNOSIS — Z79810 Long term (current) use of selective estrogen receptor modulators (SERMs): Secondary | ICD-10-CM | POA: Insufficient documentation

## 2017-09-08 DIAGNOSIS — R232 Flushing: Secondary | ICD-10-CM | POA: Insufficient documentation

## 2017-09-08 DIAGNOSIS — Z803 Family history of malignant neoplasm of breast: Secondary | ICD-10-CM | POA: Diagnosis not present

## 2017-09-08 DIAGNOSIS — C50411 Malignant neoplasm of upper-outer quadrant of right female breast: Secondary | ICD-10-CM

## 2017-09-08 DIAGNOSIS — Z87891 Personal history of nicotine dependence: Secondary | ICD-10-CM | POA: Diagnosis not present

## 2017-09-08 DIAGNOSIS — M8580 Other specified disorders of bone density and structure, unspecified site: Secondary | ICD-10-CM

## 2017-09-08 DIAGNOSIS — Z800 Family history of malignant neoplasm of digestive organs: Secondary | ICD-10-CM

## 2017-09-08 DIAGNOSIS — C50812 Malignant neoplasm of overlapping sites of left female breast: Secondary | ICD-10-CM

## 2017-09-08 DIAGNOSIS — Z170 Estrogen receptor positive status [ER+]: Secondary | ICD-10-CM | POA: Diagnosis not present

## 2017-09-08 LAB — CBC WITH DIFFERENTIAL/PLATELET
BASOS ABS: 0 10*3/uL (ref 0.0–0.1)
Basophils Relative: 1 %
EOS ABS: 0.1 10*3/uL (ref 0.0–0.5)
Eosinophils Relative: 2 %
HCT: 40.6 % (ref 34.8–46.6)
Hemoglobin: 13.6 g/dL (ref 11.6–15.9)
LYMPHS PCT: 18 %
Lymphs Abs: 0.7 10*3/uL — ABNORMAL LOW (ref 0.9–3.3)
MCH: 30.5 pg (ref 25.1–34.0)
MCHC: 33.6 g/dL (ref 31.5–36.0)
MCV: 90.8 fL (ref 79.5–101.0)
MONO ABS: 0.5 10*3/uL (ref 0.1–0.9)
Monocytes Relative: 13 %
Neutro Abs: 2.6 10*3/uL (ref 1.5–6.5)
Neutrophils Relative %: 66 %
Platelets: 323 10*3/uL (ref 145–400)
RBC: 4.47 MIL/uL (ref 3.70–5.45)
RDW: 13.6 % (ref 11.2–14.5)
WBC: 3.9 10*3/uL (ref 3.9–10.3)

## 2017-09-08 LAB — COMPREHENSIVE METABOLIC PANEL
ALBUMIN: 3.6 g/dL (ref 3.5–5.0)
ALK PHOS: 64 U/L (ref 40–150)
ALT: 24 U/L (ref 0–55)
AST: 28 U/L (ref 5–34)
Anion gap: 7 (ref 3–11)
BILIRUBIN TOTAL: 0.4 mg/dL (ref 0.2–1.2)
BUN: 10 mg/dL (ref 7–26)
CO2: 24 mmol/L (ref 22–29)
CREATININE: 0.73 mg/dL (ref 0.60–1.10)
Calcium: 8.6 mg/dL (ref 8.4–10.4)
Chloride: 102 mmol/L (ref 98–109)
GFR calc Af Amer: >60 mL/min (ref >60)
GLUCOSE: 88 mg/dL (ref 70–140)
Potassium: 4 mmol/L (ref 3.5–5.1)
Sodium: 133 mmol/L — ABNORMAL LOW (ref 136–145)
TOTAL PROTEIN: 6.5 g/dL (ref 6.4–8.3)

## 2017-09-08 MED ORDER — TAMOXIFEN CITRATE 20 MG PO TABS: 20.0000 mg | ORAL_TABLET | Freq: Every day | ORAL | 4 refills | Status: DC

## 2017-09-08 NOTE — Telephone Encounter (Signed)
Gave patient AVs and calendar of upcoming October appointments.  °

## 2017-10-11 ENCOUNTER — Other Ambulatory Visit: Payer: Self-pay | Admitting: Oncology

## 2017-11-05 ENCOUNTER — Other Ambulatory Visit: Payer: Self-pay | Admitting: Oncology

## 2018-01-26 ENCOUNTER — Ambulatory Visit: Payer: Medicare Other | Admitting: Sports Medicine

## 2018-01-26 ENCOUNTER — Encounter: Payer: Self-pay | Admitting: Sports Medicine

## 2018-01-26 DIAGNOSIS — M21611 Bunion of right foot: Secondary | ICD-10-CM | POA: Diagnosis not present

## 2018-01-26 MED ORDER — DICLOFENAC SODIUM 1 % TD GEL: 2.0000 g | Freq: Three times a day (TID) | TRANSDERMAL | 0 refills | Status: DC

## 2018-01-26 NOTE — Progress Notes (Signed)
   Subjective:    Patient ID: Sabrina Tapia, female    DOB: 1951-05-07, 67 y.o.   MRN: [id number]  HPI  Patient is 68 yo female with a past medical history significant for hallux rigidus at the first MTP and mild hallux valgus with recent diagnosis of breast cancer s/p R lumpectomy who present here for R foot hallux/1st MTP pain. Patient has a history of hallux valgus and hallux rigidus. Patient is an avid golfer, plays 3 times a week ( 18 holes) and has had worsening pain in R foot 1st toe. Saw an orthopedic surgeon who prescribe rigid plate insole which patient reports is uncomfortable and not helping her pain. She is currently taking 600 mg of ibuprofen every time she plays golf (i.e.3 times a week). Patient has had two pairs of [employer] orthotic she has been using for the past few years and reports has helped with her foot pain secondary to RT bunion.  Review of Systems  Musculoskeletal:       Right foot pain, 1st toe  Skin: Negative.   Neurological: Negative.        Objective:   Physical Exam Right foot exam: Mild hallux valgus with minimal bony deformity at MCP consistent with early bunion. Less than 5 degree flexion and extension at first MCP. Very limited medial and lateral movement. No swelling noted. TTP around 1st MCP. Palpable pulses and warm foot. Sensation and strength intact. Full ROM at the ankle, no joint laxity.  No TTP in mid/hindfoot. Strength and sensation intact.    Assessment & Plan:   #1st MTP pain, chronic, worsening Patient presented with R 1st MTP pain in the setting of hallux rigidus with OA. Patient also has mild bunion for which she had custom orthotic made back in 2015 revised in 2017. pateint has resumed and increase physical activity after completing breast cancer treatment over the past two years. Currently on Tamoxifen. Seen by Ortho surgery and prescribed rigid sole insole for hallux rigidus pain with no change in symptoms. Today L shape pad added to 1st  MTP with reported improvement in her pain. Will also prescribe voltaren gel to be applied to affected areas.  Patient was also given modified achilles stretching exercise given her history of achilles pain and now on Tamoxifen.   I observed and examined the patient with the resident and agree with assessment and plan.  Note reviewed and modified by me. Stefanie Libel, MD

## 2018-01-26 NOTE — Assessment & Plan Note (Signed)
Mild bunion but mostly first MTP OA  Hallux limitus  Topical NSAIDs L Pad to float the first MTP  IF no help we will revise shoe and foot support

## 2018-02-05 ENCOUNTER — Other Ambulatory Visit: Payer: Self-pay | Admitting: Oncology

## 2018-02-15 NOTE — Progress Notes (Signed)
67 y.o. G0P0 Single Caucasian female here for annual exam.    Had left breast reconstruction last year.  Can exercise again.   Lots of joint pain.  Saw Dr. Lenna Gilford and was told it is osteoarthritis.  Effexor working adequately for hot flashes.   Bladder function is manageable.  No incontinence.  Labs with PCP.   PCP:   Josetta Huddle, MD   Patient's last menstrual period was 07/28/1996 (approximate).           Sexually active: No.  The current method of family planning is post menopausal status.    Exercising: Yes.    cardio, pilates, yoga, golfing Smoker:  Former smoker   Health Maintenance: Pap:  01-30-17 negative, HR HPV negative           12-11-14 negative  History of abnormal Pap:  yes MMG:  03-04-16 bilateral breast cancer--Had Lt. mastectomy and Rt. Lumpectomy.  States mammogram was normal in 2018.  Has appointment for August 2019.   Colonoscopy:  08-2014 polyps, f/u 5 years  BMD:   02-2016  Result  Osteopenia  TDaP:  UTD with PCP  HIV: 12-10-16 negative  Hep C: 12-10-16 negative  Screening Labs:  Hb today: PCP, Urine today: not collected    reports that she quit smoking about 27 years ago. She has never used smokeless tobacco. She reports that she drinks alcohol. She reports that she does not use drugs.  Past Medical History:  Diagnosis Date  . Allergy-induced asthma    prn inhaler  . Asthma    allergy induced  . Bruises easily   . Dental crowns present   . Family history of adverse reaction to anesthesia    pt's father has hx. of being hard to wake up post-op  . GERD (gastroesophageal reflux disease)   . Hemorrhoids   . History of breast cancer 2017   bilateral  . History of radiation therapy    08/19/16- 09/29/16, Right Breast 50 Gy in 25 fractions, Right Breast boost 10 Gy in 5 fractions, Left Breast 50.4 Gy in 28 fractions  . Immature cataract   . Osteoarthritis    hands and feet  . Osteopenia 02/2016   T score -1.3 FRAX 8.3%/0.7% stable from prior DEXA  .  Overactive bladder     Past Surgical History:  Procedure Laterality Date  . BREAST RECONSTRUCTION WITH PLACEMENT OF TISSUE EXPANDER AND FLEX HD (ACELLULAR HYDRATED DERMIS) Left 07/01/2016   Procedure: BREAST RECONSTRUCTION WITH PLACEMENT OF TISSUE EXPANDER AND  ALLODERM;  Surgeon: Irene Limbo, MD;  Location: [place];  Service: Plastics;  Laterality: Left;  . INCISION AND DRAINAGE Left 06/02/2016   Procedure: DRAINAGE of left axilla seroma;  Surgeon: Rolm Bookbinder, MD;  Location: [place];  Service: General;  Laterality: Left;  Marland Kitchen MASTOPEXY Right 04/17/2017   Procedure: RIGHT MASTOPEXY;  Surgeon: Irene Limbo, MD;  Location: [place];  Service: Plastics;  Laterality: Right;  . NIPPLE SPARING MASTECTOMY Left 07/01/2016   Procedure: LEFT NIPPLE SPARING MASTECTOMY;  Surgeon: Rolm Bookbinder, MD;  Location: [place];  Service: General;  Laterality: Left;  . RADIOACTIVE SEED GUIDED PARTIAL MASTECTOMY WITH AXILLARY SENTINEL LYMPH NODE BIOPSY Right 03/26/2016   Procedure: RIGHT BREAST LUMPECTOMY WITH RADIOACTIVE SEED AND SENTINEL LYMPH NODE BIOPSY;  Surgeon: Rolm Bookbinder, MD;  Location: [place];  Service: General;  Laterality: Right;  . RADIOACTIVE SEED GUIDED PARTIAL MASTECTOMY WITH AXILLARY SENTINEL LYMPH NODE BIOPSY Left  05/20/2016   Procedure: LEFT BREAST RADIOACTIVE SEED (two seeds) GUIDED LUMPECTOMY WITH LEFT AXILLARY SENTINEL LYMPH NODE BIOPSY AND LEFT SEED TARGETED AXILLARY LYMPH NODE EXCISION;  Surgeon: Rolm Bookbinder, MD;  Location: [place];  Service: General;  Laterality: Left;  . RE-EXCISION OF BREAST CANCER,SUPERIOR MARGINS Right 05/20/2016   Procedure: RE-EXCISION OF RIGHT BREAST MARGIN;  Surgeon: Rolm Bookbinder, MD;  Location: [place];  Service: General;  Laterality: Right;  . RE-EXCISION OF BREAST CANCER,SUPERIOR MARGINS Left 06/02/2016    Procedure: RE-EXCISION OF BREAST CANCER,SUPERIOR MARGINS;  Surgeon: Rolm Bookbinder, MD;  Location: [place];  Service: General;  Laterality: Left;  . REMOVAL OF TISSUE EXPANDER AND PLACEMENT OF IMPLANT Left 04/17/2017   Procedure: REMOVAL OF LEFT TISSUE EXPANDER WITH PLACEMENT OF LEFT SILICONE BREAST IMPLANT;  Surgeon: Irene Limbo, MD;  Location: [place];  Service: Plastics;  Laterality: Left;    Current Outpatient Medications  Medication Sig Dispense Refill  . albuterol (PROAIR HFA) 108 (90 Base) MCG/ACT inhaler Inhale 2 puffs into the lungs every 4 (four) hours as needed for wheezing or shortness of breath. 1 Inhaler 1  . Azelastine-Fluticasone (DYMISTA) 137-50 MCG/ACT SUSP     . CALCIUM-MAGNESIUM PO Take by mouth.    . cetirizine (ZYRTEC) 10 MG tablet Take 10 mg by mouth.    . Cholecalciferol (VITAMIN D) 2000 units tablet Take 4,000 Units by mouth daily.     . diclofenac sodium (VOLTAREN) 1 % GEL Apply 2 g topically 3 (three) times daily. 100 g 0  . docusate sodium (COLACE) 50 MG capsule Take 100 mg by mouth daily.     Marland Kitchen FLOVENT HFA 110 MCG/ACT inhaler INHALE 1 PUFF BY MOUTH INTO THE LUNGS TWICE DAILY 12 g 0  . hydrocortisone (ANUSOL-HC) 2.5 % rectal cream Place 1 application rectally as needed for hemorrhoids or itching.    Marland Kitchen OVER THE COUNTER MEDICATION Take by mouth daily. FLORA RESTORE    . OVER THE COUNTER MEDICATION Take by mouth daily. [place]    . OVER THE COUNTER MEDICATION Take by mouth daily. K-12 - STATES HELPS ABSORPTION OF VIT. D    . oxyCODONE (ROXICODONE) 5 MG immediate release tablet Take 1-2 tablets (5-10 mg total) by mouth every 4 (four) hours as needed. 30 tablet 0  . PRILOSEC OTC 20 MG tablet Reported on 12/14/2015    . psyllium (METAMUCIL) 58.6 % packet Take 1 packet by mouth daily.    . tamoxifen (NOLVADEX) 20 MG tablet Take 1 tablet (20 mg total) by mouth daily. 90 tablet 4  . valACYclovir (VALTREX) 1000 MG tablet Take  1,000 mg by mouth at bedtime as needed.    . venlafaxine XR (EFFEXOR-XR) 75 MG 24 hr capsule TAKE 1 CAPSULE(75 MG) BY MOUTH DAILY WITH BREAKFAST 90 capsule 0   No current facility-administered medications for this visit.     Family History  Problem Relation Age of Onset  . Hypertension Mother   . Heart disease Mother   . Lung disease Mother   . Hypertension Father   . Cancer Father        colon  . Heart disease Father   . Lung cancer Father   . Anesthesia problems Father        hard to wake up post-op  . Breast cancer Paternal Grandmother        Age 38's  . Hypertension Brother   . Hyperlipidemia Brother   . Diabetes Maternal Grandfather  Review of Systems  Constitutional: Negative.   HENT: Negative.   Eyes: Negative.   Respiratory: Negative.   Cardiovascular: Negative.   Gastrointestinal: Positive for constipation.  Endocrine: Negative.   Genitourinary: Negative.   Musculoskeletal: Positive for arthralgias.  Skin: Negative.   Allergic/Immunologic: Negative.   Neurological: Negative.   Hematological: Negative.   Psychiatric/Behavioral: Negative.     Exam:   BP 118/70 (BP Location: Right Arm, Patient Position: Sitting, Cuff Size: Normal)   Pulse 72   Resp 14   Ht 5\' 4"  (1.626 m)   Wt 154 lb (69.9 kg)   LMP 07/28/1996 (Approximate)   BMI 26.43 kg/m     General appearance: alert, cooperative and appears stated age Head: Normocephalic, without obvious abnormality, atraumatic Neck: no adenopathy, supple, symmetrical, trachea midline and thyroid normal to inspection and palpation Lungs: clear to auscultation bilaterally Breasts: right with scarring and left breast with implant and scarring, no masses or tenderness, no nipple discharge or bleeding, No axillary or supraclavicular adenopathy Heart: regular rate and rhythm Abdomen: soft, non-tender; no masses, no organomegaly Extremities: extremities normal, atraumatic, no cyanosis or edema Skin: Skin color,  texture, turgor normal. No rashes or lesions Lymph nodes: Cervical, supraclavicular, and axillary nodes normal. No abnormal inguinal nodes palpated Neurologic: Grossly normal  Pelvic: External genitalia:  no lesions              Urethra:  normal appearing urethra with no masses, tenderness or lesions              Bartholins and Skenes: normal                 Vagina: normal appearing vagina with normal color and discharge, no lesions              Cervix: no lesions              Pap taken: No. Bimanual Exam:  Uterus:  normal size, contour, position, consistency, mobility, non-tender              Adnexa: no mass, fullness, tenderness              Rectal exam: Yes.  .  Confirms.              Anus:  normal sphincter tone, no lesions  Chaperone was present for exam.  Assessment:   Well woman visit with normal exam. Bilateral breast cancer.  Status post left mastectomy with reconstruction.  On Tamoxifen.  Overactive bladder.  Not on medication.  Not candidate for Myrbetriq due to Tamoxifen use.  Menopausal symptoms.  On Effexor.  Osteopenia.  FH breast and colon cancer.  Hx oral HSV.  Plan: Mammogram screening. Recommended self breast awareness. Pap and HR HPV as above. Guidelines for Calcium, Vitamin D, regular exercise program including cardiovascular and weight bearing exercise. BMD this year at [facility].  Ordered faxed and patient will call to schedule. Follow up annually and prn.   After visit summary provided.

## 2018-02-16 ENCOUNTER — Encounter: Payer: Self-pay | Admitting: Obstetrics and Gynecology

## 2018-02-16 ENCOUNTER — Other Ambulatory Visit: Payer: Self-pay

## 2018-02-16 ENCOUNTER — Ambulatory Visit (INDEPENDENT_AMBULATORY_CARE_PROVIDER_SITE_OTHER): Payer: Medicare Other | Admitting: Obstetrics and Gynecology

## 2018-02-16 VITALS — BP 118/70 | HR 72 | Resp 14 | Ht 64.0 in | Wt 154.0 lb

## 2018-02-16 DIAGNOSIS — Z01419 Encounter for gynecological examination (general) (routine) without abnormal findings: Secondary | ICD-10-CM | POA: Diagnosis not present

## 2018-02-16 NOTE — Patient Instructions (Signed)

## 2018-02-20 ENCOUNTER — Other Ambulatory Visit: Payer: Self-pay | Admitting: Allergy & Immunology

## 2018-02-22 ENCOUNTER — Other Ambulatory Visit: Payer: Self-pay

## 2018-02-24 ENCOUNTER — Other Ambulatory Visit: Payer: Self-pay | Admitting: Allergy & Immunology

## 2018-02-25 NOTE — Telephone Encounter (Signed)
Courtesy refill  

## 2018-04-21 ENCOUNTER — Telehealth: Payer: Self-pay

## 2018-04-21 NOTE — Telephone Encounter (Signed)
Spoke with patient and r/s for the next available date. Per 9/25 patient phone msg return

## 2018-05-02 ENCOUNTER — Telehealth: Payer: Self-pay | Admitting: Obstetrics and Gynecology

## 2018-05-02 NOTE — Telephone Encounter (Signed)
BMD report received from [place].  Dr. Virgie Dad name on results.   Just want to be sure she received report of osteopenia, which is early bone thinning but not osteoporosis.  The risk of fracture is not considered high enough to proceed with pharmacologic treatment.   1200 mg of calcium daily, 800 IU of vitamin D daily, and weight bearing exercise will help to maintain bone density and strength.  Her next bone density is due in 2 years.

## 2018-05-03 ENCOUNTER — Ambulatory Visit: Payer: Self-pay | Admitting: Oncology

## 2018-05-03 ENCOUNTER — Other Ambulatory Visit: Payer: Self-pay

## 2018-05-03 NOTE — Telephone Encounter (Signed)
Call to patient. BMD results reviewed with patient and she verbalized understanding. Patient aware to incorporate calcium and vitamin d into daily regimen and will repeat BMD in 2 years.   Routing to provider and will close encounter.

## 2018-05-05 ENCOUNTER — Other Ambulatory Visit: Payer: Self-pay | Admitting: Oncology

## 2018-05-11 ENCOUNTER — Encounter: Payer: Self-pay | Admitting: Obstetrics and Gynecology

## 2018-05-24 NOTE — H&P (Signed)
Subjective:     Patient ID: Sabrina Tapia is a 67 y.o. female.  Follow-up     13 months post op implant exchange. Plan for lipofilling left chest.  Presented following screening MMG 02/2016. In the UOQ of the right breast there was an area of architectural distortion, by [place] this measured 0.5 cm. The right axilla was sonographically benign. Biopsy of the right breast mass showed IDC, ER/PR+, Her2 -. On 03/26/2016 she underwent right lumpectomy and SLN with pathology 1/4 cm IDC, margins clear, and a macrometastatic deposit of ductal carcinoma in the single SLN. A separate 0.6 cm ILC was notedfocally involving the final right medial margin.   Breast MRI obtained post lumpectomy 2 enhancing masses posterior to the lumpectomy cavity, the larger measuring 1.0 cm.  One of these was biopsied and showed benign lymph node tissue. The MRI showed no evidence of additional disease in the RIGHT breast. In the LEFT breast centrally there was a 1.3 cm enhancing mass; biopsy of this was IDC. Biopsy of a second LEFT breast mass not seen on MRI, UOQ showed an IDC ER+/PR- , HER-2 -. Biopsy of LEFT axilla LN with IDC, ER/PR-.  She subsequently underwent left lumpectomy and SLN. Pathology with 1.5 cm IDC,  2/4 lymph nodes +  with positive margins. Underwent reexcision and this showed IDC still present at margins . Mastectomy pathology with multiple foci IDC spanning at least 0.6 cm, LV invasion identified, margins negative. Nipple biopsy specimen with mulitple foci IDC, largest 0.7 cm and invasive disease focally 0.1 cm to anterior margin. Final specimen labeled Left Superior Margin with multiple foci IDC spanning at least 0.8 cm with invasive carcinoma present at inferior margin.- These have been reviewed with Dr. Donne Hazel and inferior margin continuous with mastectomy specimen and no further excision recommended.  Mammaprint bilateral low risk. Completed bilateral XRT with capecitabine sensitization to her  radiation treatments 3.5.18.  Prior to lumpectomies 38 C. Left mastectomy 305 g, weight lumpectomies not available. Right mastopexy not weighed.  MMG Left 03/2018 normal  Retired from [employer]. Lives alone.  Review of Systems     Objective:   Physical Exam  Cardiovascular: Normal rate, regular rhythm and normal heart sounds.  Pulmonary/Chest: Effort normal and breath sounds normal.   Chest: scars maturing, right volume slightly larger, left animation present, depression at caudal extent pectoralis no significant rippling  soft bilateral Baker 1 Abdomen soft some redundancy esp infraumbilical no hernia minimal striae SN to nipple R 22 L 22 cm Nipple to IMF R 7 L 7 cm   Assessment:     R breast ca UOQ s/p lumpectomy, SLN, reexcision for margins L breast ca UOQ s/p lumpectomy, SLN, reexcision for margins with continued positive margins S/p left NSM, TE/ADM (Alloderm) reconstruction Adjuvant bilateral chest radiation S/p removal left TE, placement silicone implant, right mastopexy    Plan:     Significant depression at muscle edge- reviewed will always have some movement and/or depression in dual plane position. Plan fat grafting to area in effort to improve contour. Plan OP surgery, no drains, donor site pain and bruising, need for postop compression, laxity of skin may not contract. Reviewed variable take of graft, may need to repeat, fat necrosis that presents as lumps. Plan infra and supraumbilical abdomen as donor site. She will purchase or may have Spanx type garment for home use.   Additional risks including but not limited to bleeding, seroma, hematoma, damage to deeper structures, rupture implant, DVT/PE, contour asymmetries  or depressions donor site reviewed.  Rx for Percocet given.   Natrelle Inspira Smooth Round Full Projection 485 ml implant placed in left chest, REF [id number]     Irene Limbo, MD [facility] Plastic & Reconstructive  Surgery [phone], pin [phone]

## 2018-06-07 ENCOUNTER — Encounter (HOSPITAL_BASED_OUTPATIENT_CLINIC_OR_DEPARTMENT_OTHER): Payer: Self-pay | Admitting: *Deleted

## 2018-06-07 ENCOUNTER — Other Ambulatory Visit: Payer: Self-pay

## 2018-06-10 NOTE — Progress Notes (Signed)
Ensure pre surgery drink given with instructions to complete by 0730 dos, surgical soap given with instructions, pt verbalized understanding. 

## 2018-06-11 ENCOUNTER — Ambulatory Visit (HOSPITAL_BASED_OUTPATIENT_CLINIC_OR_DEPARTMENT_OTHER)
Admission: RE | Admit: 2018-06-11 | Discharge: 2018-06-11 | Disposition: A | Discharge status: Home / Self Care | Payer: Medicare Other | Source: Ambulatory Visit | Attending: Plastic Surgery | Admitting: Plastic Surgery

## 2018-06-11 ENCOUNTER — Encounter (HOSPITAL_BASED_OUTPATIENT_CLINIC_OR_DEPARTMENT_OTHER): Payer: Self-pay | Admitting: *Deleted

## 2018-06-11 ENCOUNTER — Ambulatory Visit (HOSPITAL_BASED_OUTPATIENT_CLINIC_OR_DEPARTMENT_OTHER): Payer: Medicare Other | Admitting: Anesthesiology

## 2018-06-11 ENCOUNTER — Encounter (HOSPITAL_BASED_OUTPATIENT_CLINIC_OR_DEPARTMENT_OTHER)
Admission: RE | Disposition: A | Discharge status: Home / Self Care | Payer: Self-pay | Source: Ambulatory Visit | Attending: Plastic Surgery

## 2018-06-11 ENCOUNTER — Other Ambulatory Visit: Payer: Self-pay

## 2018-06-11 DIAGNOSIS — Z853 Personal history of malignant neoplasm of breast: Secondary | ICD-10-CM | POA: Insufficient documentation

## 2018-06-11 DIAGNOSIS — Z79899 Other long term (current) drug therapy: Secondary | ICD-10-CM | POA: Diagnosis not present

## 2018-06-11 DIAGNOSIS — F329 Major depressive disorder, single episode, unspecified: Secondary | ICD-10-CM | POA: Insufficient documentation

## 2018-06-11 HISTORY — PX: LIPOSUCTION WITH LIPOFILLING: SHX6436

## 2018-06-11 SURGERY — LIPOSUCTION, WITH FAT TRANSFER
Anesthesia: General | Site: Chest | Laterality: Left

## 2018-06-11 MED ORDER — PROPOFOL 10 MG/ML IV BOLUS
INTRAVENOUS | Status: AC
  Filled 2018-06-11: qty 20

## 2018-06-11 MED ORDER — CELECOXIB 200 MG PO CAPS
ORAL_CAPSULE | ORAL | Status: AC
  Filled 2018-06-11: qty 1

## 2018-06-11 MED ORDER — PROPOFOL 10 MG/ML IV BOLUS
INTRAVENOUS | Status: DC | PRN
  Administered 2018-06-11: 150 mg via INTRAVENOUS

## 2018-06-11 MED ORDER — DEXAMETHASONE SODIUM PHOSPHATE 10 MG/ML IJ SOLN
INTRAMUSCULAR | Status: AC
  Filled 2018-06-11: qty 1

## 2018-06-11 MED ORDER — GABAPENTIN 300 MG PO CAPS
300.0000 mg | ORAL_CAPSULE | ORAL | Status: AC
  Administered 2018-06-11: 300 mg via ORAL

## 2018-06-11 MED ORDER — HYDROMORPHONE HCL 1 MG/ML IJ SOLN
INTRAMUSCULAR | Status: AC
  Filled 2018-06-11: qty 0.5

## 2018-06-11 MED ORDER — GABAPENTIN 300 MG PO CAPS
ORAL_CAPSULE | ORAL | Status: AC
  Filled 2018-06-11: qty 1

## 2018-06-11 MED ORDER — ONDANSETRON HCL 4 MG/2ML IJ SOLN
INTRAMUSCULAR | Status: DC | PRN
  Administered 2018-06-11: 4 mg via INTRAVENOUS

## 2018-06-11 MED ORDER — FENTANYL CITRATE (PF) 100 MCG/2ML IJ SOLN
INTRAMUSCULAR | Status: AC
  Filled 2018-06-11: qty 2

## 2018-06-11 MED ORDER — PHENYLEPHRINE 40 MCG/ML (10ML) SYRINGE FOR IV PUSH (FOR BLOOD PRESSURE SUPPORT)
PREFILLED_SYRINGE | INTRAVENOUS | Status: AC
  Filled 2018-06-11: qty 10

## 2018-06-11 MED ORDER — SUCCINYLCHOLINE CHLORIDE 200 MG/10ML IV SOSY
PREFILLED_SYRINGE | INTRAVENOUS | Status: AC
  Filled 2018-06-11: qty 10

## 2018-06-11 MED ORDER — EPHEDRINE SULFATE 50 MG/ML IJ SOLN
INTRAMUSCULAR | Status: DC | PRN
  Administered 2018-06-11: 10 mg via INTRAVENOUS

## 2018-06-11 MED ORDER — EPHEDRINE 5 MG/ML INJ
INTRAVENOUS | Status: AC
  Filled 2018-06-11: qty 10

## 2018-06-11 MED ORDER — MIDAZOLAM HCL 2 MG/2ML IJ SOLN
1.0000 mg | INTRAMUSCULAR | Status: DC | PRN
  Administered 2018-06-11: 2 mg via INTRAVENOUS

## 2018-06-11 MED ORDER — CEFAZOLIN SODIUM-DEXTROSE 2-4 GM/100ML-% IV SOLN
2.0000 g | INTRAVENOUS | Status: AC
  Administered 2018-06-11 (×2): 2 g via INTRAVENOUS

## 2018-06-11 MED ORDER — HYDROMORPHONE HCL 1 MG/ML IJ SOLN
0.2500 mg | INTRAMUSCULAR | Status: DC | PRN
  Administered 2018-06-11: 0.5 mg via INTRAVENOUS

## 2018-06-11 MED ORDER — DEXAMETHASONE SODIUM PHOSPHATE 4 MG/ML IJ SOLN
INTRAMUSCULAR | Status: DC | PRN
  Administered 2018-06-11: 4 mg via INTRAVENOUS

## 2018-06-11 MED ORDER — PHENYLEPHRINE HCL 10 MG/ML IJ SOLN
INTRAMUSCULAR | Status: DC | PRN
  Administered 2018-06-11: 120 ug via INTRAVENOUS
  Administered 2018-06-11 (×2): 40 ug via INTRAVENOUS

## 2018-06-11 MED ORDER — ACETAMINOPHEN 500 MG PO TABS
ORAL_TABLET | ORAL | Status: AC
  Filled 2018-06-11: qty 2

## 2018-06-11 MED ORDER — LIDOCAINE 2% (20 MG/ML) 5 ML SYRINGE
INTRAMUSCULAR | Status: AC
  Filled 2018-06-11: qty 5

## 2018-06-11 MED ORDER — FENTANYL CITRATE (PF) 100 MCG/2ML IJ SOLN
50.0000 ug | INTRAMUSCULAR | Status: DC | PRN
  Administered 2018-06-11: 100 ug via INTRAVENOUS

## 2018-06-11 MED ORDER — LIDOCAINE HCL (CARDIAC) PF 100 MG/5ML IV SOSY
PREFILLED_SYRINGE | INTRAVENOUS | Status: DC | PRN
  Administered 2018-06-11: 60 mg via INTRAVENOUS

## 2018-06-11 MED ORDER — OXYCODONE HCL 5 MG PO TABS: 5.0000 mg | ORAL_TABLET | Freq: Once | ORAL | Status: DC | PRN

## 2018-06-11 MED ORDER — CEFAZOLIN SODIUM-DEXTROSE 2-4 GM/100ML-% IV SOLN
INTRAVENOUS | Status: AC
  Filled 2018-06-11: qty 100

## 2018-06-11 MED ORDER — CELECOXIB 200 MG PO CAPS
200.0000 mg | ORAL_CAPSULE | ORAL | Status: AC
  Administered 2018-06-11: 200 mg via ORAL

## 2018-06-11 MED ORDER — LACTATED RINGERS IV SOLN
INTRAVENOUS | Status: DC
  Administered 2018-06-11 (×2): via INTRAVENOUS

## 2018-06-11 MED ORDER — OXYCODONE HCL 5 MG/5ML PO SOLN: 5.0000 mg | Freq: Once | ORAL | Status: DC | PRN

## 2018-06-11 MED ORDER — CHLORHEXIDINE GLUCONATE CLOTH 2 % EX PADS: 6.0000 | MEDICATED_PAD | Freq: Once | CUTANEOUS | Status: DC

## 2018-06-11 MED ORDER — ACETAMINOPHEN 500 MG PO TABS
1000.0000 mg | ORAL_TABLET | ORAL | Status: AC
  Administered 2018-06-11: 1000 mg via ORAL

## 2018-06-11 MED ORDER — MEPERIDINE HCL 25 MG/ML IJ SOLN: 6.2500 mg | INTRAMUSCULAR | Status: DC | PRN

## 2018-06-11 MED ORDER — SCOPOLAMINE 1 MG/3DAYS TD PT72: 1.0000 | MEDICATED_PATCH | Freq: Once | TRANSDERMAL | Status: DC | PRN

## 2018-06-11 MED ORDER — PROMETHAZINE HCL 25 MG/ML IJ SOLN: 6.2500 mg | INTRAMUSCULAR | Status: DC | PRN

## 2018-06-11 MED ORDER — MIDAZOLAM HCL 2 MG/2ML IJ SOLN
INTRAMUSCULAR | Status: AC
  Filled 2018-06-11: qty 2

## 2018-06-11 MED ORDER — LACTATED RINGERS IV SOLN: INTRAVENOUS | Status: DC

## 2018-06-11 MED ORDER — SODIUM BICARBONATE 4 % IV SOLN
INTRAVENOUS | Status: DC | PRN
  Administered 2018-06-11: 400 mL via INTRAMUSCULAR

## 2018-06-11 MED ORDER — ONDANSETRON HCL 4 MG/2ML IJ SOLN
INTRAMUSCULAR | Status: AC
  Filled 2018-06-11: qty 2

## 2018-06-11 SURGICAL SUPPLY — 62 items
ADH SKN CLS APL DERMABOND .7 (GAUZE/BANDAGES/DRESSINGS) ×2
BINDER ABDOMINAL 10 UNV 27-48 (MISCELLANEOUS) ×1 IMPLANT
BINDER ABDOMINAL 12 SM 30-45 (SOFTGOODS) IMPLANT
BINDER BREAST LRG (GAUZE/BANDAGES/DRESSINGS) IMPLANT
BINDER BREAST MEDIUM (GAUZE/BANDAGES/DRESSINGS) IMPLANT
BINDER BREAST XLRG (GAUZE/BANDAGES/DRESSINGS) IMPLANT
BINDER BREAST XXLRG (GAUZE/BANDAGES/DRESSINGS) IMPLANT
BLADE SURG 10 STRL SS (BLADE) IMPLANT
BLADE SURG 11 STRL SS (BLADE) ×2 IMPLANT
BNDG GAUZE ELAST 4 BULKY (GAUZE/BANDAGES/DRESSINGS) ×2 IMPLANT
CANISTER LIPO FAT HARVEST (MISCELLANEOUS) IMPLANT
CANISTER SUCT 1200ML W/VALVE (MISCELLANEOUS) IMPLANT
CHLORAPREP W/TINT 26ML (MISCELLANEOUS) ×3 IMPLANT
COVER BACK TABLE 60X90IN (DRAPES) ×2 IMPLANT
COVER MAYO STAND STRL (DRAPES) ×2 IMPLANT
COVER WAND RF STERILE (DRAPES) IMPLANT
DECANTER SPIKE VIAL GLASS SM (MISCELLANEOUS) IMPLANT
DERMABOND ADVANCED (GAUZE/BANDAGES/DRESSINGS) ×2
DERMABOND ADVANCED .7 DNX12 (GAUZE/BANDAGES/DRESSINGS) ×2 IMPLANT
DRAPE TOP ARMCOVERS (MISCELLANEOUS) ×2 IMPLANT
DRAPE U-SHAPE 76X120 STRL (DRAPES) ×2 IMPLANT
DRSG PAD ABDOMINAL 8X10 ST (GAUZE/BANDAGES/DRESSINGS) ×5 IMPLANT
ELECT COATED BLADE 2.86 ST (ELECTRODE) IMPLANT
ELECT REM PT RETURN 9FT ADLT (ELECTROSURGICAL) ×2
ELECTRODE REM PT RTRN 9FT ADLT (ELECTROSURGICAL) ×1 IMPLANT
EXTRACTOR CANIST REVOLVE STRL (CANNISTER) ×1 IMPLANT
GLOVE BIO SURGEON STRL SZ 6 (GLOVE) ×2 IMPLANT
GLOVE BIO SURGEON STRL SZ7 (GLOVE) ×1 IMPLANT
GLOVE BIOGEL PI IND STRL 7.5 (GLOVE) IMPLANT
GLOVE BIOGEL PI INDICATOR 7.5 (GLOVE) ×1
GLOVE EXAM NITRILE MD LF STRL (GLOVE) ×1 IMPLANT
GOWN STRL REUS W/ TWL LRG LVL3 (GOWN DISPOSABLE) ×2 IMPLANT
GOWN STRL REUS W/TWL LRG LVL3 (GOWN DISPOSABLE) ×4
IV LACTATED RINGERS 1000ML (IV SOLUTION) ×2 IMPLANT
LINER CANISTER 1000CC FLEX (MISCELLANEOUS) ×3 IMPLANT
NDL SAFETY ECLIPSE 18X1.5 (NEEDLE) ×1 IMPLANT
NEEDLE HYPO 18GX1.5 SHARP (NEEDLE) ×8
NS IRRIG 1000ML POUR BTL (IV SOLUTION) IMPLANT
PACK BASIN DAY SURGERY FS (CUSTOM PROCEDURE TRAY) ×2 IMPLANT
PAD ALCOHOL SWAB (MISCELLANEOUS) ×2 IMPLANT
PENCIL BUTTON HOLSTER BLD 10FT (ELECTRODE) IMPLANT
SHEET MEDIUM DRAPE 40X70 STRL (DRAPES) ×4 IMPLANT
SLEEVE SCD COMPRESS KNEE MED (MISCELLANEOUS) ×2 IMPLANT
SPONGE LAP 18X18 RF (DISPOSABLE) ×2 IMPLANT
STAPLER VISISTAT 35W (STAPLE) ×2 IMPLANT
SUT MNCRL AB 4-0 PS2 18 (SUTURE) ×2 IMPLANT
SUT VIC AB 3-0 PS1 18 (SUTURE)
SUT VIC AB 3-0 PS1 18XBRD (SUTURE) IMPLANT
SUT VIC AB 3-0 SH 27 (SUTURE)
SUT VIC AB 3-0 SH 27X BRD (SUTURE) IMPLANT
SUT VICRYL 4-0 PS2 18IN ABS (SUTURE) IMPLANT
SYR 10ML LL (SYRINGE) ×6 IMPLANT
SYR 50ML LL SCALE MARK (SYRINGE) ×7 IMPLANT
SYR BULB IRRIGATION 50ML (SYRINGE) ×1 IMPLANT
SYR CONTROL 10ML LL (SYRINGE) IMPLANT
SYR TB 1ML LL NO SAFETY (SYRINGE) ×1 IMPLANT
TOWEL GREEN STERILE FF (TOWEL DISPOSABLE) ×4 IMPLANT
TUBE CONNECTING 20X1/4 (TUBING) ×2 IMPLANT
TUBING INFILTRATION IT-10001 (TUBING) ×2 IMPLANT
TUBING SET GRADUATE ASPIR 12FT (MISCELLANEOUS) ×2 IMPLANT
UNDERPAD 30X30 (UNDERPADS AND DIAPERS) ×4 IMPLANT
YANKAUER SUCT BULB TIP NO VENT (SUCTIONS) IMPLANT

## 2018-06-11 NOTE — Op Note (Signed)
Operative Note   DATE OF OPERATION: 11.15.19  LOCATION: [facility]-outpatient  SURGICAL DIVISION: Plastic Surgery  PREOPERATIVE DIAGNOSES:  1. History breast cancer 2. Acquired absence breast 3. History therapeutic radiation  POSTOPERATIVE DIAGNOSES:  same  PROCEDURE:  Lipofilling from abdomen to left chest  SURGEON: Irene Limbo MD MBA  ASSISTANT: none  ANESTHESIA:  General.   EBL: minimal  COMPLICATIONS: None immediate.   INDICATIONS FOR PROCEDURE:  The patient, Sabrina Tapia, is a 67 y.o. female born on 04/23/1951, is here for fat grafting to left chest. She has undergone nipple sparing mastectomy with dual plane implant based reconstruction, adjuvant radiation.   FINDINGS: 70 ml fat infiltrated over left chest.   DESCRIPTION OF PROCEDURE:  The patient's operative site was marked with the patient in the preoperative area including areas of pectoralis animation left chest, contour depressions for grafting, and supra and infraumbilical abdomen for liposuction.. The patient was taken to the operating room. SCDs were placed and IV antibiotics were given. The patient's operative site was prepped and draped in a sterile fashion. A time out was performed and all information was confirmed to be correct.    Stab incision made over bilaterallateral abdomen. Tumescent fluid infiltratedover lateral thighs,total300 ml tumescent infiltrated. Power assisted liposuction performed to endpoint symmetric contour and soft tissue thickness. The fat was then washed and prepared by filtration with Revolve system. Stab incision made over upper outer and lower inner left chest. Harvested fat was then infiltrated in subcutaneous plane throughout total envelope mastectomy flap.  Incisions approximated with simple 4-0 monocryl stitch.Tissue adhesive applied to chest incisions. Dry dressing applied, followed by breast and abdomen compression garments.  The patient was allowed to wake  from anesthesia, extubated and taken to the recovery room in satisfactory condition.   SPECIMENS: none  DRAINS: none  Irene Limbo, MD [facility] Plastic & Reconstructive Surgery [phone], pin [phone]

## 2018-06-11 NOTE — Anesthesia Preprocedure Evaluation (Addendum)
Anesthesia Evaluation  Patient identified by MRN, date of birth, ID band Patient awake    Reviewed: Allergy & Precautions, NPO status , Patient's Chart, lab work & pertinent test results  Airway Mallampati: I  TM Distance: >3 FB Neck ROM: Full    Dental  (+) Teeth Intact, Dental Advisory Given, Caps   Pulmonary asthma , former smoker,    breath sounds clear to auscultation       Cardiovascular negative cardio ROS   Rhythm:Regular Rate:Normal     Neuro/Psych negative neurological ROS     GI/Hepatic Neg liver ROS, GERD  ,  Endo/Other  negative endocrine ROS  Renal/GU negative Renal ROS     Musculoskeletal  (+) Arthritis , Osteoarthritis,    Abdominal Normal abdominal exam  (+)   Peds  Hematology negative hematology ROS (+)   Anesthesia Other Findings   Reproductive/Obstetrics                            Anesthesia Physical Anesthesia Plan  ASA: II  Anesthesia Plan: General   Post-op Pain Management:    Induction: Intravenous  PONV Risk Score and Plan: 4 or greater and Ondansetron, Dexamethasone, Midazolam and Scopolamine patch - Pre-op  Airway Management Planned: Oral ETT  Additional Equipment: None  Intra-op Plan:   Post-operative Plan: Extubation in OR  Informed Consent: I have reviewed the patients History and Physical, chart, labs and discussed the procedure including the risks, benefits and alternatives for the proposed anesthesia with the patient or authorized representative who has indicated his/her understanding and acceptance.   Dental advisory given  Plan Discussed with: CRNA  Anesthesia Plan Comments:        Anesthesia Quick Evaluation

## 2018-06-11 NOTE — Transfer of Care (Signed)
Immediate Anesthesia Transfer of Care Note  Patient: Sabrina Tapia  Procedure(s) Performed: Lipofilling from abdomen to left chest (Left Chest)  Patient Location: PACU  Anesthesia Type:General  Level of Consciousness: awake, alert  and oriented  Airway & Oxygen Therapy: Patient Spontanous Breathing and Patient connected to face mask oxygen  Post-op Assessment: Report given to RN and Post -op Vital signs reviewed and stable  Post vital signs: Reviewed and stable  Last Vitals:  Vitals Value Taken Time  BP 110/67 06/11/2018 12:01 PM  Temp    Pulse 81 06/11/2018 12:03 PM  Resp 13 06/11/2018 12:03 PM  SpO2 98 % 06/11/2018 12:03 PM  Vitals shown include unvalidated device data.  Last Pain:  Vitals:   06/11/18 1008  TempSrc: Oral  PainSc:          Complications: No apparent anesthesia complications

## 2018-06-11 NOTE — Anesthesia Procedure Notes (Addendum)
Procedure Name: LMA Insertion Date/Time: 06/11/2018 10:53 AM Performed by: Willa Frater, CRNA Pre-anesthesia Checklist: Patient identified, Emergency Drugs available, Suction available and Patient being monitored Patient Re-evaluated:Patient Re-evaluated prior to induction Oxygen Delivery Method: Circle system utilized Preoxygenation: Pre-oxygenation with 100% oxygen Induction Type: IV induction Ventilation: Mask ventilation without difficulty LMA: LMA inserted LMA Size: 4.0 Number of attempts: 1 Airway Equipment and Method: Bite block Placement Confirmation: positive ETCO2 Tube secured with: Tape Dental Injury: Teeth and Oropharynx as per pre-operative assessment

## 2018-06-11 NOTE — Anesthesia Postprocedure Evaluation (Signed)
Anesthesia Post Note  Patient: Sabrina Tapia  Procedure(s) Performed: Lipofilling from abdomen to left chest (Left Chest)     Patient location during evaluation: PACU Anesthesia Type: General Level of consciousness: awake and alert Pain management: pain level controlled Vital Signs Assessment: post-procedure vital signs reviewed and stable Respiratory status: spontaneous breathing, nonlabored ventilation, respiratory function stable and patient connected to nasal cannula oxygen Cardiovascular status: blood pressure returned to baseline and stable Postop Assessment: no apparent nausea or vomiting Anesthetic complications: no    Last Vitals:  Vitals:   06/11/18 1245 06/11/18 1357  BP: 113/78 (!) 154/83  Pulse: 78 75  Resp: 17 20  Temp:  36.5 C  SpO2: 96% 97%    Last Pain:  Vitals:   06/11/18 1357  TempSrc: Oral  PainSc: [place]

## 2018-06-11 NOTE — Interval H&P Note (Signed)
History and Physical Interval Note:  06/11/2018 9:54 AM  Sabrina Tapia  has presented today for surgery, with the diagnosis of Acquired absence of left breast, History of therapeutic radiation, History of bilateral breast cancer  The various methods of treatment have been discussed with the patient and family. After consideration of risks, benefits and other options for treatment, the patient has consented to  Procedure(s): Lipofilling from abdomen to left chest (Left) as a surgical intervention .  The patient's history has been reviewed, patient examined, no change in status, stable for surgery.  I have reviewed the patient's chart and labs.  Questions were answered to the patient's satisfaction.     Arnoldo Hooker Hollace Michelli

## 2018-06-11 NOTE — Discharge Instructions (Signed)
°  NO TYLENOL BEFORE 4PM TODAY!     Post Anesthesia Home Care Instructions  Activity: Get plenty of rest for the remainder of the day. A responsible individual must stay with you for 24 hours following the procedure.  For the next 24 hours, DO NOT: -Drive a car -Paediatric nurse -Drink alcoholic beverages -Take any medication unless instructed by your physician -Make any legal decisions or sign important papers.  Meals: Start with liquid foods such as gelatin or soup. Progress to regular foods as tolerated. Avoid greasy, spicy, heavy foods. If nausea and/or vomiting occur, drink only clear liquids until the nausea and/or vomiting subsides. Call your physician if vomiting continues.  Special Instructions/Symptoms: Your throat may feel dry or sore from the anesthesia or the breathing tube placed in your throat during surgery. If this causes discomfort, gargle with warm salt water. The discomfort should disappear within 24 hours.  If you had a scopolamine patch placed behind your ear for the management of post- operative nausea and/or vomiting:  1. The medication in the patch is effective for 72 hours, after which it should be removed.  Wrap patch in a tissue and discard in the trash. Wash hands thoroughly with soap and water. 2. You may remove the patch earlier than 72 hours if you experience unpleasant side effects which may include dry mouth, dizziness or visual disturbances. 3. Avoid touching the patch. Wash your hands with soap and water after contact with the patch.

## 2018-06-14 ENCOUNTER — Encounter (HOSPITAL_BASED_OUTPATIENT_CLINIC_OR_DEPARTMENT_OTHER): Payer: Self-pay | Admitting: Plastic Surgery

## 2018-06-15 ENCOUNTER — Ambulatory Visit: Payer: Medicare Other | Admitting: Allergy & Immunology

## 2018-07-11 NOTE — Progress Notes (Signed)
[place]  [phone]) [phone] [phone]     ID: NETTA FODGE DOB: 31-Jan-1951  MR#: [id number]  ZYY#:[id number]  Patient Care Team: Josetta Huddle, MD as PCP - General (Internal Medicine) Rolm Bookbinder, MD as Consulting Physician (General Surgery) , Virgie Dad, MD as Consulting Physician (Oncology) Phineas Real, Belinda Block, MD as Consulting Physician (Gynecology) Delice Bison, Charlestine Massed, NP as Nurse Practitioner (Hematology and Oncology) OTHER MD:   CHIEF COMPLAINT: Estrogen receptor positive breast cancer  CURRENT TREATMENT: Tamoxifen   BREAST CANCER HISTORY: From the earlier summary note:  "[name]" had screening mammography at her gynecologist's office suggestive of a change in the upper-outer quadrant of the right breast. She was referred to [facility] where on 03/04/2016 she underwent bilateral diagnostic mammography and right breast ultrasonography. The breast density was category C. In the upper outer quadrant of the right breast there was a new area of architectural distortion. By ultrasonography this measured 0.5 cm. The right axilla was reported as sonographically benign.  On 03/06/2016 she underwent core needle biopsy of the right breast mass in question, and this showed ([id number]) an invasive ductal carcinoma, grade 1, estrogen receptor 95% positive, progesterone receptor 95% positive, both with strong staining intensity, with an MIB-1 of 12%, and no HER-2 amplification, the signals ratio being 1.54 and the number per cell 2.15.  Her case was presented in the multidisciplinary breast cancer conference 03/12/2016.Marland Kitchen At that time a preliminary plan was proposed: Breast conserving surgery with sentinel lymph node sampling, no Oncotype if the tumor was no larger than 5 mm, and consideration of genetics testing.  On 03/26/2016 [name] underwent right lumpectomy and sentinel lymph node sampling. This showed ([id number]) an invasive ductal carcinoma  measuring 1.4 cm, grade 1, with negative margins, and a macrometastatic deposit of ductal carcinoma in the single sentinel lymph node. Mammaprint from this tumor was read as "low risk".  However there was a separate invasive lobular carcinoma measuring 0.6 cm,  focally involving the final right medial margin. Because of the lobular breast cancer bilateral breast MRI was obtained 04/04/2016. This showed in the right breast a 7.4 x 4.5 cm seroma with 2 enhancing masses posterior to the lumpectomy cavity, the larger measuring 1.0 cm. These were felt possibly to be reactive lymph nodes. One of these lymph nodes was biopsied 04/09/2016 and this showed ([id number]) benign lymph node tissue. The MRI showed no evidence of additional disease in the right breast.  However in the left breast centrally there was a 1.3 cm enhancing mass. Biopsy of this left breast mass 04/09/2016 ([id number]) showed an invasive ductal carcinoma, grade 1 or 2, with insufficient tissue for a prognostic panel. On 04/11/2016 biopsy of a second left breast mass, upper outer quadrant, 4.6 cm a way from the other left breast mass, showed ([id number]) an invasive ductal carcinoma, grade 2, estrogen receptor 10% positive with strong staining intensity, progesterone receptor negative, with an MIB-1 of 2%, and no HER-2 amplification, the signals ratio being 1.50 and the number per cell 2.10. The proliferation fraction was 2%.  On 04/14/2016 [name] underwent biopsy of a suspicious left axillary lymph node and this ([id number]) was positive for invasive ductal carcinoma, estrogen and progesterone receptor negative, with an MIB-1 of 10%. I do not find that HER-2 was repeated. Mammaprint from this tumor also came back low risk.  Her subsequent history is as detailed below.  INTERVAL HISTORY: Alison Murray returns today for follow-up of her estrogen receptor positive breast cancer.  The patient continues on tamoxifen, which she is tolerating well.  She is experiencing hot flashes. They are no worse than before. Her vaginal wetness is unchanged.   Since her last visit here, she underwent a bone density screening on 04/22/2018 at [facility], showing a T-score of -1.9, which is considered osteopenic.   She also underwent 3D unilateral right mammography on 04/22/2018 at [facility], showing no mammographic evidence of malignancy.   She underwent fat grafting to her left breast 06/11/2018 with Dr. Iran Planas. She experienced no redness, no swelling, and no pain. She notes scarring in the left axilla.    REVIEW OF SYSTEMS: Alison Murray is doing well overall. She has been exercising, with a two week break from the fat grafting, but hasn't been able to play golf due to the weather. She has been traveling, and has been to [place], [place], [place], and [place]. The patient denies unusual headaches, visual changes, nausea, vomiting, or dizziness. There has been no unusual cough, phlegm production, or pleurisy. This been no change in bowel or bladder habits. The patient denies unexplained fatigue or unexplained weight loss, bleeding, rash, or fever. A detailed review of systems was otherwise noncontributory.     PAST MEDICAL HISTORY: Past Medical History:  Diagnosis Date  . Allergy-induced asthma    prn inhaler  . Asthma    allergy induced  . Bruises easily   . Dental crowns present   . Family history of adverse reaction to anesthesia    pt's father has hx. of being hard to wake up post-op  . GERD (gastroesophageal reflux disease)   . Hemorrhoids   . History of breast cancer 2017   bilateral  . History of radiation therapy    08/19/16- 09/29/16, Right Breast 50 Gy in 25 fractions, Right Breast boost 10 Gy in 5 fractions, Left Breast 50.4 Gy in 28 fractions  . Immature cataract   . Osteoarthritis    hands and feet  . Osteopenia 02/2016   T score -1.3 FRAX 8.3%/0.7% stable from prior DEXA  . Overactive bladder     PAST SURGICAL HISTORY: Past Surgical  History:  Procedure Laterality Date  . BREAST RECONSTRUCTION WITH PLACEMENT OF TISSUE EXPANDER AND FLEX HD (ACELLULAR HYDRATED DERMIS) Left 07/01/2016   Procedure: BREAST RECONSTRUCTION WITH PLACEMENT OF TISSUE EXPANDER AND  ALLODERM;  Surgeon: Irene Limbo, MD;  Location: [place];  Service: Plastics;  Laterality: Left;  . INCISION AND DRAINAGE Left 06/02/2016   Procedure: DRAINAGE of left axilla seroma;  Surgeon: Rolm Bookbinder, MD;  Location: [place];  Service: General;  Laterality: Left;  . LIPOSUCTION WITH LIPOFILLING Left 06/11/2018   Procedure: Lipofilling from abdomen to left chest;  Surgeon: Irene Limbo, MD;  Location: [place];  Service: Plastics;  Laterality: Left;  Marland Kitchen MASTOPEXY Right 04/17/2017   Procedure: RIGHT MASTOPEXY;  Surgeon: Irene Limbo, MD;  Location: [place];  Service: Plastics;  Laterality: Right;  . NIPPLE SPARING MASTECTOMY Left 07/01/2016   Procedure: LEFT NIPPLE SPARING MASTECTOMY;  Surgeon: Rolm Bookbinder, MD;  Location: [place];  Service: General;  Laterality: Left;  . RADIOACTIVE SEED GUIDED PARTIAL MASTECTOMY WITH AXILLARY SENTINEL LYMPH NODE BIOPSY Right 03/26/2016   Procedure: RIGHT BREAST LUMPECTOMY WITH RADIOACTIVE SEED AND SENTINEL LYMPH NODE BIOPSY;  Surgeon: Rolm Bookbinder, MD;  Location: [place];  Service: General;  Laterality: Right;  . RADIOACTIVE SEED GUIDED PARTIAL MASTECTOMY WITH AXILLARY SENTINEL LYMPH NODE BIOPSY Left 05/20/2016  Procedure: LEFT BREAST RADIOACTIVE SEED (two seeds) GUIDED LUMPECTOMY WITH LEFT AXILLARY SENTINEL LYMPH NODE BIOPSY AND LEFT SEED TARGETED AXILLARY LYMPH NODE EXCISION;  Surgeon: Rolm Bookbinder, MD;  Location: [place];  Service: General;  Laterality: Left;  . RE-EXCISION OF BREAST CANCER,SUPERIOR MARGINS Right 05/20/2016   Procedure: RE-EXCISION OF RIGHT BREAST MARGIN;  Surgeon:  Rolm Bookbinder, MD;  Location: [place];  Service: General;  Laterality: Right;  . RE-EXCISION OF BREAST CANCER,SUPERIOR MARGINS Left 06/02/2016   Procedure: RE-EXCISION OF BREAST CANCER,SUPERIOR MARGINS;  Surgeon: Rolm Bookbinder, MD;  Location: [place];  Service: General;  Laterality: Left;  . REMOVAL OF TISSUE EXPANDER AND PLACEMENT OF IMPLANT Left 04/17/2017   Procedure: REMOVAL OF LEFT TISSUE EXPANDER WITH PLACEMENT OF LEFT SILICONE BREAST IMPLANT;  Surgeon: Irene Limbo, MD;  Location: [place];  Service: Plastics;  Laterality: Left;    FAMILY HISTORY Family History  Problem Relation Age of Onset  . Hypertension Mother   . Heart disease Mother   . Lung disease Mother   . Hypertension Father   . Cancer Father        colon  . Heart disease Father   . Lung cancer Father   . Anesthesia problems Father        hard to wake up post-op  . Breast cancer Paternal Grandmother        Age 10's  . Hypertension Brother   . Hyperlipidemia Brother   . Diabetes Maternal Grandfather   The patient's father died from lung cancer at the age of 28. He also had a remote history of colon cancer. His mother had a history of breast cancer in her 67s. The patient's mother died at age 53. The patient has one brother, no sisters.  GYNECOLOGIC HISTORY:  Patient's last menstrual period was 07/28/1996 (approximate). Menarche age 77, the patient is GX P0. 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 She lives alone, with no pets.    ADVANCED DIRECTIVES: In place. She has named her brother Marya Amsler as her healthcare part of attorney. He can be reached at [phone]   HEALTH MAINTENANCE: Social History   Tobacco Use  . Smoking status: Former Smoker    Last attempt to  quit: 07/27/1990    Years since quitting: 27.9  . Smokeless tobacco: Never Used  Substance Use Topics  . Alcohol use: Yes    Comment: 2 beers daily  . Drug use: No     Colonoscopy:February 2016  PAP:  Bone density: 03/04/2016/osteopenia   Allergies  Allergen Reactions  . Hydrocodone Other (See Comments)    Restlessness, "energy"  . Hydrocodone-Acetaminophen Other (See Comments)  . Penicillins Itching  . Adhesive [Tape] Other (See Comments)    SKIN IRRITATION  . Augmentin [Amoxicillin-Pot Clavulanate] Itching    Current Outpatient Medications  Medication Sig Dispense Refill  . albuterol (PROAIR HFA) 108 (90 Base) MCG/ACT inhaler Inhale 2 puffs into the lungs every 4 (four) hours as needed for wheezing or shortness of breath. 1 Inhaler 1  . Azelastine-Fluticasone (DYMISTA) 137-50 MCG/ACT SUSP     . CALCIUM-MAGNESIUM PO Take by mouth.    . cetirizine (ZYRTEC) 10 MG tablet Take 10 mg by mouth.    . Cholecalciferol (VITAMIN D) 2000 units tablet Take 4,000 Units by mouth  daily.     . diclofenac sodium (VOLTAREN) 1 % GEL Apply 2 g topically 3 (three) times daily. 100 g 0  . docusate sodium (COLACE) 50 MG capsule Take 100 mg by mouth daily.     Marland Kitchen FLOVENT HFA 110 MCG/ACT inhaler INHALE 1 PUFF BY MOUTH INTO THE LUNGS TWICE DAILY 12 g 0  . hydrocortisone (ANUSOL-HC) 2.5 % rectal cream Place 1 application rectally as needed for hemorrhoids or itching.    Marland Kitchen OVER THE COUNTER MEDICATION Take by mouth daily. FLORA RESTORE    . OVER THE COUNTER MEDICATION Take by mouth daily. [place]    . PRILOSEC OTC 20 MG tablet Reported on 12/14/2015    . psyllium (METAMUCIL) 58.6 % packet Take 1 packet by mouth daily.    . tamoxifen (NOLVADEX) 20 MG tablet Take 1 tablet (20 mg total) by mouth daily. 90 tablet 4  . venlafaxine XR (EFFEXOR-XR) 75 MG 24 hr capsule TAKE 1 CAPSULE(75 MG) BY MOUTH DAILY WITH BREAKFAST 90 capsule 0   No current facility-administered medications for this visit.      OBJECTIVE: Middle-aged white woman who appears well  Vitals:   07/12/18 1134  BP: (!) 136/91  Pulse: 72  Resp: 18  Temp: 97.8 F (36.6 C)  SpO2: 100%     Body mass index is 27.89 kg/m.    ECOG FS:1 - Symptomatic but completely ambulatory  Sclerae unicteric, EOMs intact Oropharynx clear and moist No cervical or supraclavicular adenopathy Lungs no rales or rhonchi Heart regular rate and rhythm Abd soft, nontender, positive bowel sounds MSK no focal spinal tenderness, no upper extremity lymphedema Neuro: nonfocal, well oriented, appropriate affect Breasts: That is post bilateral lumpectomies and bilateral radiation treatment.  Both breasts are unremarkable.  In the left axilla there is a firm mass measuring approximately 1-1/2 cm, which is nontender.  She tells me it has been there but I do not recall it being so well demarcated.  Right axilla is benign  LAB RESULTS:  CMP     Component Value Date/Time   NA 133 (L) 09/08/2017 1056   NA 136 03/12/2017 1115   K 4.0 09/08/2017 1056   K 4.0 03/12/2017 1115   CL 102 09/08/2017 1056   CO2 24 09/08/2017 1056   CO2 25 03/12/2017 1115   GLUCOSE 88 09/08/2017 1056   GLUCOSE 78 03/12/2017 1115   BUN 10 09/08/2017 1056   BUN 12.4 03/12/2017 1115   CREATININE 0.73 09/08/2017 1056   CREATININE 0.7 03/12/2017 1115   CALCIUM 8.6 09/08/2017 1056   CALCIUM 9.1 03/12/2017 1115   PROT 6.5 09/08/2017 1056   PROT 6.6 03/12/2017 1115   ALBUMIN 3.6 09/08/2017 1056   ALBUMIN 3.6 03/12/2017 1115   AST 28 09/08/2017 1056   AST 34 03/12/2017 1115   ALT 24 09/08/2017 1056   ALT 33 03/12/2017 1115   ALKPHOS 64 09/08/2017 1056   ALKPHOS 57 03/12/2017 1115   BILITOT 0.4 09/08/2017 1056   BILITOT 0.41 03/12/2017 1115   GFRNONAA >60 09/08/2017 1056   GFRAA >60 09/08/2017 1056    INo results found for: SPEP, UPEP  Lab Results  Component Value Date   WBC 5.6 07/12/2018   NEUTROABS 3.9 07/12/2018   HGB 13.4 07/12/2018   HCT 40.9  07/12/2018   MCV 92.7 07/12/2018   PLT 291 07/12/2018      Chemistry      Component Value Date/Time   NA 133 (L) 09/08/2017 1056   NA 136  03/12/2017 1115   K 4.0 09/08/2017 1056   K 4.0 03/12/2017 1115   CL 102 09/08/2017 1056   CO2 24 09/08/2017 1056   CO2 25 03/12/2017 1115   BUN 10 09/08/2017 1056   BUN 12.4 03/12/2017 1115   CREATININE 0.73 09/08/2017 1056   CREATININE 0.7 03/12/2017 1115      Component Value Date/Time   CALCIUM 8.6 09/08/2017 1056   CALCIUM 9.1 03/12/2017 1115   ALKPHOS 64 09/08/2017 1056   ALKPHOS 57 03/12/2017 1115   AST 28 09/08/2017 1056   AST 34 03/12/2017 1115   ALT 24 09/08/2017 1056   ALT 33 03/12/2017 1115   BILITOT 0.4 09/08/2017 1056   BILITOT 0.41 03/12/2017 1115       No results found for: LABCA2  No components found for: LABCA125  No results for input(s): INR in the last 168 hours.  Urinalysis    Component Value Date/Time   COLORURINE YELLOW 01/31/2016 1526   APPEARANCEUR CLEAR 01/31/2016 1526   LABSPEC 1.006 01/31/2016 1526   PHURINE 6.5 01/31/2016 1526   GLUCOSEU NEGATIVE 01/31/2016 1526   HGBUR NEGATIVE 01/31/2016 1526   BILIRUBINUR NEGATIVE 01/31/2016 1526   KETONESUR NEGATIVE 01/31/2016 1526   PROTEINUR NEGATIVE 01/31/2016 1526   UROBILINOGEN 0.2 12/11/2014 1506   NITRITE NEGATIVE 01/31/2016 1526   LEUKOCYTESUR TRACE (A) 01/31/2016 1526     STUDIES: Mammography and bone density discussed with the patient  ELIGIBLE FOR AVAILABLE RESEARCH PROTOCOL: no  ASSESSMENT: 67 y.o. [place] woman status post right breast upper outer quadrant lumpectomy 03/26/2016 for a pT1c pN1, stage IIA invasive ductal carcinoma, grade 1, estrogen and progesterone receptor positive, HER-2 nonamplified, with an MIB-1 of 12%   (a) mammaprint from this tumor was read as "low risk".   (1) a second right breast cancer, invasive lobular, grade 1, measuring 0.6 cm, focally involved the final right medial margin from the 03/26/2016 procedure;  this tumor also was estrogen and progesterone receptor positive, HER-2 negative, with an MIB-1 of 3%  (a) medial margin cleared with additional surgery 05/20/2016.  (2) two biopsies from the left breast 04/09/2016 and 04/11/2016, 4.6 cm apart, showed  (a) centrally, an invasive ductal carcinoma, grade 1 or 2, with no prognostic panel available  (b) in the upper outer quadrant, invasive ductal carcinoma, grade 2, estrogen receptor 10% positive, progesterone receptor and HER-2 negative, with an MIB-1 of 2%  (c) left axillary lymph node biopsy 04/14/2016 showed invasive ductal carcinoma, estrogen and progesterone receptor negative  (d) mammaprint from (2)(c) was also "low risk"  (3) left lumpectomy 05/20/2016 showed a  pT1c pN1 stage IIA  invasive ductal carcinoma, grade 2, estrogen receptor positive, progesterone receptor negative, HER-2 nonamplified, with an MIB-1 of 2%; margins were positive   (4) status post left sided nipple sparing mastectomy with immediate expander placement 07/01/2016 showing multiple foci of residual grade 2 invasive ductal carcinoma, the largest measuring 0.7 cm, with evidence of lymphovascular invasion and multiple close but negative margins  (5) radiation therapy 08/19/16-03/05/18with capecitabine sensitization Site/dose:   1) Right breast/ 50Gy in 25 fractions                         2) Right breast boost/ 10 Gy in 5 fractions                         3) Left breast 50.4 Gy in 28 fractions   (6) tamoxifen started  04/18/2016--to be continued a minimum of 5 years, likely followed by anastrozole x 2 years  (7) osteopenia, with T score -1.9 on bone density scan on 04/22/2018   PLAN; Alison Murray is now a little over 2 years out from definitive surgery for her breast cancer with no evidence of disease recurrence.  This is very favorable.  She is tolerating tamoxifen well and the plan will be to continue that most likely to a total of 10 years.  She did undergo left-sided fat  grafting and there is a slightly more prominent mass in her left axilla.  I think it would be prudent to obtain an ultrasound of that and I have written for it.  Otherwise she will return to see me next October, after her 2020 mammography in August.  She knows to call for any other issue that may develop before that visit.   , Virgie Dad, MD  07/12/18 12:00 PM Medical Oncology and Hematology [facility] [address] [place], [place] [address] Tel. [phone]    Fax. [phone]   I, Jacqualyn Posey am acting as a Education administrator for Chauncey Cruel, MD.   I, Lurline Del MD, have reviewed the above documentation for accuracy and completeness, and I agree with the above.

## 2018-07-12 ENCOUNTER — Telehealth: Payer: Self-pay | Admitting: Oncology

## 2018-07-12 ENCOUNTER — Telehealth: Payer: Self-pay | Admitting: Hematology and Oncology

## 2018-07-12 ENCOUNTER — Inpatient Hospital Stay (HOSPITAL_BASED_OUTPATIENT_CLINIC_OR_DEPARTMENT_OTHER): Payer: Medicare Other | Admitting: Oncology

## 2018-07-12 ENCOUNTER — Inpatient Hospital Stay: Payer: Medicare Other | Attending: Oncology

## 2018-07-12 VITALS — BP 136/91 | HR 72 | Temp 97.8°F | Resp 18 | Ht 64.0 in | Wt 162.5 lb

## 2018-07-12 DIAGNOSIS — R232 Flushing: Secondary | ICD-10-CM

## 2018-07-12 DIAGNOSIS — J45909 Unspecified asthma, uncomplicated: Secondary | ICD-10-CM

## 2018-07-12 DIAGNOSIS — Z79899 Other long term (current) drug therapy: Secondary | ICD-10-CM | POA: Diagnosis not present

## 2018-07-12 DIAGNOSIS — C50411 Malignant neoplasm of upper-outer quadrant of right female breast: Secondary | ICD-10-CM | POA: Diagnosis present

## 2018-07-12 DIAGNOSIS — Z801 Family history of malignant neoplasm of trachea, bronchus and lung: Secondary | ICD-10-CM

## 2018-07-12 DIAGNOSIS — Z79810 Long term (current) use of selective estrogen receptor modulators (SERMs): Secondary | ICD-10-CM | POA: Insufficient documentation

## 2018-07-12 DIAGNOSIS — M8580 Other specified disorders of bone density and structure, unspecified site: Secondary | ICD-10-CM | POA: Insufficient documentation

## 2018-07-12 DIAGNOSIS — Z803 Family history of malignant neoplasm of breast: Secondary | ICD-10-CM

## 2018-07-12 DIAGNOSIS — Z87891 Personal history of nicotine dependence: Secondary | ICD-10-CM | POA: Diagnosis not present

## 2018-07-12 DIAGNOSIS — K219 Gastro-esophageal reflux disease without esophagitis: Secondary | ICD-10-CM | POA: Diagnosis not present

## 2018-07-12 DIAGNOSIS — Z853 Personal history of malignant neoplasm of breast: Secondary | ICD-10-CM | POA: Insufficient documentation

## 2018-07-12 DIAGNOSIS — M1990 Unspecified osteoarthritis, unspecified site: Secondary | ICD-10-CM | POA: Insufficient documentation

## 2018-07-12 DIAGNOSIS — Z800 Family history of malignant neoplasm of digestive organs: Secondary | ICD-10-CM | POA: Diagnosis not present

## 2018-07-12 DIAGNOSIS — Z170 Estrogen receptor positive status [ER+]: Secondary | ICD-10-CM

## 2018-07-12 DIAGNOSIS — Z923 Personal history of irradiation: Secondary | ICD-10-CM | POA: Insufficient documentation

## 2018-07-12 DIAGNOSIS — C50812 Malignant neoplasm of overlapping sites of left female breast: Secondary | ICD-10-CM

## 2018-07-12 LAB — CBC WITH DIFFERENTIAL/PLATELET
Abs Immature Granulocytes: 0.02 10*3/uL (ref 0.00–0.07)
BASOS ABS: 0 10*3/uL (ref 0.0–0.1)
Basophils Relative: 1 %
EOS ABS: 0.2 10*3/uL (ref 0.0–0.5)
Eosinophils Relative: 4 %
HEMATOCRIT: 40.9 % (ref 36.0–46.0)
Hemoglobin: 13.4 g/dL (ref 12.0–15.0)
IMMATURE GRANULOCYTES: 0 %
LYMPHS ABS: 1 10*3/uL (ref 0.7–4.0)
LYMPHS PCT: 17 %
MCH: 30.4 pg (ref 26.0–34.0)
MCHC: 32.8 g/dL (ref 30.0–36.0)
MCV: 92.7 fL (ref 80.0–100.0)
Monocytes Absolute: 0.5 10*3/uL (ref 0.1–1.0)
Monocytes Relative: 8 %
NEUTROS ABS: 3.9 10*3/uL (ref 1.7–7.7)
NEUTROS PCT: 70 %
Platelets: 291 10*3/uL (ref 150–400)
RBC: 4.41 MIL/uL (ref 3.87–5.11)
RDW: 13.2 % (ref 11.5–15.5)
WBC: 5.6 10*3/uL (ref 4.0–10.5)
nRBC: 0 % (ref 0.0–0.2)

## 2018-07-12 LAB — COMPREHENSIVE METABOLIC PANEL
ALT: 20 U/L (ref 0–44)
ANION GAP: 10 (ref 5–15)
AST: 21 U/L (ref 15–41)
Albumin: 3.6 g/dL (ref 3.5–5.0)
Alkaline Phosphatase: 58 U/L (ref 38–126)
BUN: 12 mg/dL (ref 8–23)
CHLORIDE: 102 mmol/L (ref 98–111)
CO2: 24 mmol/L (ref 22–32)
Calcium: 9 mg/dL (ref 8.9–10.3)
Creatinine, Ser: 0.75 mg/dL (ref 0.44–1.00)
GFR calc non Af Amer: >60 mL/min (ref >60)
Glucose, Bld: 94 mg/dL (ref 70–99)
POTASSIUM: 4.2 mmol/L (ref 3.5–5.1)
SODIUM: 136 mmol/L (ref 135–145)
Total Bilirubin: 0.4 mg/dL (ref 0.3–1.2)
Total Protein: 6.4 g/dL — ABNORMAL LOW (ref 6.5–8.1)

## 2018-07-12 NOTE — Telephone Encounter (Signed)
Per 12/16 los.   °

## 2018-07-12 NOTE — Telephone Encounter (Signed)
Per 12/16 los.  Gave patient avs and calendar. Faxed order to [place] [facility].

## 2018-07-12 NOTE — Telephone Encounter (Signed)
Gave patient avs and calendar.   °

## 2018-08-06 ENCOUNTER — Other Ambulatory Visit: Payer: Self-pay | Admitting: Oncology

## 2018-08-08 ENCOUNTER — Other Ambulatory Visit: Payer: Self-pay | Admitting: Oncology

## 2018-09-21 ENCOUNTER — Ambulatory Visit: Payer: Self-pay

## 2018-09-21 ENCOUNTER — Ambulatory Visit: Payer: Medicare Other | Admitting: Sports Medicine

## 2018-09-21 ENCOUNTER — Encounter: Payer: Self-pay | Admitting: Sports Medicine

## 2018-09-21 VITALS — BP 124/84 | Ht 64.0 in | Wt 160.0 lb

## 2018-09-21 DIAGNOSIS — S46219A Strain of muscle, fascia and tendon of other parts of biceps, unspecified arm, initial encounter: Secondary | ICD-10-CM

## 2018-09-21 DIAGNOSIS — M25511 Pain in right shoulder: Secondary | ICD-10-CM | POA: Diagnosis not present

## 2018-09-21 NOTE — Assessment & Plan Note (Signed)
Conservative care Very likely to complete tear Easy HEP with curls and rotaitons Use pain as limiting factor  This should resolve with time

## 2018-09-21 NOTE — Progress Notes (Signed)
CC; RT shoulder pain  RT shoulder had been hurting somewhat past month Went to play darts Next day very painful  Wants to stay active  [place] trip [place] 3 [place] Pain responds to aleve  No other known injury  ROS Hurts to roll over onto rt shoulder Hurts to reach forward and up No neck pain or radiating nerve pain  PE Pleasant F in NAD BP 124/84   Ht 5\' 4"  (1.626 m)   Wt 160 lb (72.6 kg)   LMP 07/28/1996 (Approximate)   BMI 27.46 kg/m   RT shoulder Pain on [place] Pain on speeds Mild pain on resisted IR ER pain free Abductin pain free  Impingement testing only mild pain More with hawkinds  ROM is full  Ultrasound of Right shoulder Nearly complete tear of bicipital tendon Large hypoechoic area ~ 2 cms on long axis Transverse axis - only a few fibers intact  All RC tendons appear normal AC joint with some mild OA changes  Impression: High grade bicipital tendon tear Right shoulder

## 2018-09-22 ENCOUNTER — Telehealth: Payer: Self-pay | Admitting: Internal Medicine

## 2018-09-22 NOTE — Telephone Encounter (Signed)
Patient would like a CD of the ultrasound from yesterday.  Please let her know if/when she can pick it up .

## 2018-09-22 NOTE — Telephone Encounter (Signed)
Please call pt and tell her she can pick up her U/S disc anytime today before 5, or tomorrow 8:30-5 pm. Thanks!

## 2018-10-05 ENCOUNTER — Other Ambulatory Visit: Payer: Self-pay | Admitting: Oncology

## 2018-10-05 DIAGNOSIS — M79641 Pain in right hand: Secondary | ICD-10-CM | POA: Insufficient documentation

## 2018-10-15 ENCOUNTER — Telehealth: Payer: Self-pay | Admitting: Obstetrics and Gynecology

## 2018-10-15 DIAGNOSIS — N950 Postmenopausal bleeding: Secondary | ICD-10-CM

## 2018-10-15 NOTE — Telephone Encounter (Signed)
Spoke with patient. She states she is having daily light brown spotting. No heavy bleeding.  On Tamoxifen.  Advised needs evaluation for post menopausal bleeding.  Offered appointment, she would like to see Dr. Quincy Simmonds. She states she will call back if bleeding increases and she will be seen for earlier appointment. But prefers to wait at this time. Declines earlier visit offered.  Knows to call if bleeding increases.  Encounter closed.

## 2018-10-15 NOTE — Telephone Encounter (Signed)
Patient is calling regarding discharge. Patient stated that she believes it is a result of the tamoxifen.

## 2018-10-16 NOTE — Telephone Encounter (Signed)
I recommend a pelvic ultrasound on Thursday March 26 in office if possible.  I will see the patient the next day as scheduled with me.

## 2018-10-18 DIAGNOSIS — M7501 Adhesive capsulitis of right shoulder: Secondary | ICD-10-CM | POA: Insufficient documentation

## 2018-10-18 NOTE — Addendum Note (Signed)
Addended by: Gwendlyn Deutscher on: 10/18/2018 01:54 PM   Modules accepted: Orders

## 2018-10-18 NOTE — Telephone Encounter (Signed)
Spoke with patient. Appointment scheduled for 10/21/2018 at 8:30 am for PUS. Patient is agreeable to date and time. Will keep appointment as scheduled for evaluation and consult on 10/22/2018 with Dr.Silva. Order for PUS placed.  Routing to provider and will close encounter.

## 2018-10-21 ENCOUNTER — Ambulatory Visit (INDEPENDENT_AMBULATORY_CARE_PROVIDER_SITE_OTHER): Payer: Medicare Other

## 2018-10-21 ENCOUNTER — Other Ambulatory Visit: Payer: Self-pay

## 2018-10-21 ENCOUNTER — Other Ambulatory Visit: Payer: Medicare Other

## 2018-10-21 DIAGNOSIS — N950 Postmenopausal bleeding: Secondary | ICD-10-CM | POA: Diagnosis not present

## 2018-10-22 ENCOUNTER — Encounter: Payer: Self-pay | Admitting: Obstetrics and Gynecology

## 2018-10-22 ENCOUNTER — Other Ambulatory Visit: Payer: Self-pay | Admitting: Oncology

## 2018-10-22 ENCOUNTER — Other Ambulatory Visit: Payer: Self-pay

## 2018-10-22 ENCOUNTER — Ambulatory Visit (INDEPENDENT_AMBULATORY_CARE_PROVIDER_SITE_OTHER): Payer: Medicare Other | Admitting: Obstetrics and Gynecology

## 2018-10-22 VITALS — BP 124/84 | HR 70 | Temp 98.1°F | Resp 16 | Ht 64.0 in | Wt 164.0 lb

## 2018-10-22 DIAGNOSIS — N950 Postmenopausal bleeding: Secondary | ICD-10-CM | POA: Diagnosis not present

## 2018-10-22 DIAGNOSIS — R9389 Abnormal findings on diagnostic imaging of other specified body structures: Secondary | ICD-10-CM | POA: Diagnosis not present

## 2018-10-22 DIAGNOSIS — Z79810 Long term (current) use of selective estrogen receptor modulators (SERMs): Secondary | ICD-10-CM

## 2018-10-22 NOTE — Progress Notes (Signed)
GYNECOLOGY  VISIT   HPI: 68 y.o.   Single  Caucasian  female   G0P0 with Patient's last menstrual period was 07/28/1996 (approximate).   here for PMB.  Postmenopausal bleeding started 2 weeks ago.  Bleeding feels like it was sluffing and spotting more than bleeding.  Could be be fresh or old blood.  Felt like something has changed in pelvic area.  Prior to the bleeding, had a cycle of migraines.  Was treated with steroids.  HA better.   Still on Tamoxifen.   GYNECOLOGIC HISTORY: Patient's last menstrual period was 07/28/1996 (approximate). Contraception:  Postmenopausal Menopausal hormone therapy:  none Last mammogram:   03-04-16 bilateral breast cancer--Had Lt. mastectomy and Rt. Lumpectomy -- 04/22/18 Right Diagnostic MMG BIRADS 2 benign/density c Last pap smear:   01/30/17 Neg:Neg HR HPV        OB History    Gravida  0   Para      Term      Preterm      AB      Living        SAB      TAB      Ectopic      Multiple      Live Births                 Patient Active Problem List   Diagnosis Date Noted  . Tear of biceps tendon 09/21/2018  . Moderate persistent asthma, uncomplicated 01/60/1093  . Mild persistent asthma, uncomplicated 23/55/7322  . Other seasonal allergic rhinitis 02/12/2017  . Malignant neoplasm of overlapping sites of left breast in female, estrogen receptor positive ([place]) 10/27/2016  . Sinusitis 09/01/2016  . Hyponatremia 09/01/2016  . Malignant neoplasm of upper-outer quadrant of right breast in female, estrogen receptor positive ([place]) 04/19/2016  . Acid reflux 11/20/2015  . Bunion of great toe of right foot 04/04/2015  . Hemorrhoid 11/23/2014  . Anal skin tag 11/23/2014  . Anal burning 11/23/2014  . Plantar fasciitis, right 05/11/2014  . Retrocalcaneal bursitis 05/11/2014  . Pain in joint, ankle and foot 05/11/2014  . Heart burn   . Joint pain   . Abnormal Pap smear   . Osteopenia     Past Medical History:  Diagnosis Date  .  Allergy-induced asthma    prn inhaler  . Asthma    allergy induced  . Bruises easily   . Dental crowns present   . Family history of adverse reaction to anesthesia    pt's father has hx. of being hard to wake up post-op  . GERD (gastroesophageal reflux disease)   . Hemorrhoids   . History of breast cancer 2017   bilateral  . History of radiation therapy    08/19/16- 09/29/16, Right Breast 50 Gy in 25 fractions, Right Breast boost 10 Gy in 5 fractions, Left Breast 50.4 Gy in 28 fractions  . Immature cataract   . Osteoarthritis    hands and feet  . Osteopenia 02/2016   T score -1.3 FRAX 8.3%/0.7% stable from prior DEXA  . Overactive bladder     Past Surgical History:  Procedure Laterality Date  . BREAST RECONSTRUCTION WITH PLACEMENT OF TISSUE EXPANDER AND FLEX HD (ACELLULAR HYDRATED DERMIS) Left 07/01/2016   Procedure: BREAST RECONSTRUCTION WITH PLACEMENT OF TISSUE EXPANDER AND  ALLODERM;  Surgeon: Irene Limbo, MD;  Location: [place];  Service: Plastics;  Laterality: Left;  . INCISION AND DRAINAGE Left 06/02/2016   Procedure: DRAINAGE of left axilla  seroma;  Surgeon: Rolm Bookbinder, MD;  Location: [place];  Service: General;  Laterality: Left;  . LIPOSUCTION WITH LIPOFILLING Left 06/11/2018   Procedure: Lipofilling from abdomen to left chest;  Surgeon: Irene Limbo, MD;  Location: [place];  Service: Plastics;  Laterality: Left;  Marland Kitchen MASTOPEXY Right 04/17/2017   Procedure: RIGHT MASTOPEXY;  Surgeon: Irene Limbo, MD;  Location: [place];  Service: Plastics;  Laterality: Right;  . NIPPLE SPARING MASTECTOMY Left 07/01/2016   Procedure: LEFT NIPPLE SPARING MASTECTOMY;  Surgeon: Rolm Bookbinder, MD;  Location: [place];  Service: General;  Laterality: Left;  . RADIOACTIVE SEED GUIDED PARTIAL MASTECTOMY WITH AXILLARY SENTINEL LYMPH NODE BIOPSY Right 03/26/2016   Procedure: RIGHT BREAST  LUMPECTOMY WITH RADIOACTIVE SEED AND SENTINEL LYMPH NODE BIOPSY;  Surgeon: Rolm Bookbinder, MD;  Location: [place];  Service: General;  Laterality: Right;  . RADIOACTIVE SEED GUIDED PARTIAL MASTECTOMY WITH AXILLARY SENTINEL LYMPH NODE BIOPSY Left 05/20/2016   Procedure: LEFT BREAST RADIOACTIVE SEED (two seeds) GUIDED LUMPECTOMY WITH LEFT AXILLARY SENTINEL LYMPH NODE BIOPSY AND LEFT SEED TARGETED AXILLARY LYMPH NODE EXCISION;  Surgeon: Rolm Bookbinder, MD;  Location: [place];  Service: General;  Laterality: Left;  . RE-EXCISION OF BREAST CANCER,SUPERIOR MARGINS Right 05/20/2016   Procedure: RE-EXCISION OF RIGHT BREAST MARGIN;  Surgeon: Rolm Bookbinder, MD;  Location: [place];  Service: General;  Laterality: Right;  . RE-EXCISION OF BREAST CANCER,SUPERIOR MARGINS Left 06/02/2016   Procedure: RE-EXCISION OF BREAST CANCER,SUPERIOR MARGINS;  Surgeon: Rolm Bookbinder, MD;  Location: [place];  Service: General;  Laterality: Left;  . REMOVAL OF TISSUE EXPANDER AND PLACEMENT OF IMPLANT Left 04/17/2017   Procedure: REMOVAL OF LEFT TISSUE EXPANDER WITH PLACEMENT OF LEFT SILICONE BREAST IMPLANT;  Surgeon: Irene Limbo, MD;  Location: [place];  Service: Plastics;  Laterality: Left;    Current Outpatient Medications  Medication Sig Dispense Refill  . albuterol (PROAIR HFA) 108 (90 Base) MCG/ACT inhaler Inhale 2 puffs into the lungs every 4 (four) hours as needed for wheezing or shortness of breath. 1 Inhaler 1  . Azelastine-Fluticasone (DYMISTA) 137-50 MCG/ACT SUSP     . CALCIUM-MAGNESIUM PO Take by mouth.    . cetirizine (ZYRTEC) 10 MG tablet Take 10 mg by mouth.    . Cholecalciferol (VITAMIN D) 2000 units tablet Take 4,000 Units by mouth daily.     . diclofenac sodium (VOLTAREN) 1 % GEL Apply 2 g topically 3 (three) times daily. 100 g 0  . docusate sodium (COLACE) 50 MG capsule Take 100 mg by mouth daily.      Marland Kitchen FLOVENT HFA 110 MCG/ACT inhaler INHALE 1 PUFF BY MOUTH INTO THE LUNGS TWICE DAILY 12 g 0  . hydrocortisone (ANUSOL-HC) 2.5 % rectal cream Place 1 application rectally as needed for hemorrhoids or itching.    Marland Kitchen OVER THE COUNTER MEDICATION Take by mouth daily. FLORA RESTORE    . OVER THE COUNTER MEDICATION Take by mouth daily. [place]    . PRILOSEC OTC 20 MG tablet Reported on 12/14/2015    . psyllium (METAMUCIL) 58.6 % packet Take 1 packet by mouth daily.    . tamoxifen (NOLVADEX) 20 MG tablet TAKE 1 TABLET(20 MG) BY MOUTH DAILY 90 tablet 4  . venlafaxine XR (EFFEXOR-XR) 75 MG 24 hr capsule TAKE 1 CAPSULE(75 MG) BY MOUTH DAILY WITH BREAKFAST 90 capsule 0   No current facility-administered medications for this visit.  ALLERGIES: Hydrocodone; Hydrocodone-acetaminophen; Penicillins; Adhesive [tape]; and Augmentin [amoxicillin-pot clavulanate]  Family History  Problem Relation Age of Onset  . Hypertension Mother   . Heart disease Mother   . Lung disease Mother   . Hypertension Father   . Cancer Father        colon  . Heart disease Father   . Lung cancer Father   . Anesthesia problems Father        hard to wake up post-op  . Breast cancer Paternal Grandmother        Age 76's  . Hypertension Brother   . Hyperlipidemia Brother   . Diabetes Maternal Grandfather     Social History   Socioeconomic History  . Marital status: Single    Spouse name: Not on file  . Number of children: Not on file  . Years of education: Not on file  . Highest education level: Not on file  Occupational History  . Not on file  Social Needs  . Financial resource strain: Not on file  . Food insecurity:    Worry: Not on file    Inability: Not on file  . Transportation needs:    Medical: Not on file    Non-medical: Not on file  Tobacco Use  . Smoking status: Former Smoker    Last attempt to quit: 07/27/1990    Years since quitting: 28.2  . Smokeless tobacco: Never Used  Substance and  Sexual Activity  . Alcohol use: Yes    Comment: 2 beers daily  . Drug use: No  . Sexual activity: Not Currently    Partners: Male    Birth control/protection: Post-menopausal    Comment: 1st intercourse 42 yo-5 partners  Lifestyle  . Physical activity:    Days per week: Not on file    Minutes per session: Not on file  . Stress: Not on file  Relationships  . Social connections:    Talks on phone: Not on file    Gets together: Not on file    Attends religious service: Not on file    Active member of club or organization: Not on file    Attends meetings of clubs or organizations: Not on file    Relationship status: Not on file  . Intimate partner violence:    Fear of current or ex partner: Not on file    Emotionally abused: Not on file    Physically abused: Not on file    Forced sexual activity: Not on file  Other Topics Concern  . Not on file  Social History Narrative  . Not on file    Review of Systems  Constitutional: Negative.   HENT: Negative.   Eyes: Negative.   Respiratory: Negative.   Cardiovascular: Negative.   Gastrointestinal: Negative.   Endocrine: Negative.   Genitourinary: Negative.        Post menopausal bleeding  Musculoskeletal: Negative.   Skin: Negative.   Allergic/Immunologic: Negative.   Neurological: Negative.   Hematological: Negative.   Psychiatric/Behavioral: Negative.     PHYSICAL EXAMINATION:    BP 124/84   Pulse 70   Temp 98.1 F (36.7 C) (Oral)   Resp 16   Ht 5\' 4"  (1.626 m)   Wt 164 lb (74.4 kg)   LMP 07/28/1996 (Approximate) Comment: pmb bleeding  BMI 28.15 kg/m     General appearance: alert, cooperative and appears stated age  Pelvic: External genitalia:  no lesions  Urethra:  normal appearing urethra with no masses, tenderness or lesions              Bartholins and Skenes: normal                 Vagina: normal appearing vagina with normal color and discharge, no lesions              Cervix: no lesions                 Bimanual Exam:  Uterus:  normal size, contour, position, consistency, mobility, non-tender              Adnexa: no mass, fullness, tenderness     Endometrial biopsy Consent for procedure.  Sterile prep with Hibiclens.  Paracervical block with 10 cc 15 Lidocaine, lot [id number], exp 4/23.  Tenaculum to anterior cervical lip.  Pipelle passed x 2.  Tissue to pathology.  Not a large sample.  No complications.  Minimal EBL.   Chaperone was present for exam.  ASSESSMENT  Postmenopausal bleeding.  Thickened endometrium.  Tamoxifen use.    PLAN  We discussed postmenopausal bleeding and Tamoxifen effect on the endometrium causing potential polyps, endometrial thickening, hyperplasia, and endometrial cancer, which is usually a well differentiated malignancy treated adequately with surgical care. FU EMB.  Biopsy precautions discussed.  If EMB is benign, I anticipate she will still need a hysteroscopy with dilation and curettage due to the thickening noted on ultrasound.  If EMB shows malignancy, to GYN Oncology.   An After Visit Summary was printed and given to the patient.  _25_____ minutes face to face time of which over 50% was spent in counseling.

## 2018-10-22 NOTE — Patient Instructions (Signed)

## 2018-10-24 ENCOUNTER — Encounter: Payer: Self-pay | Admitting: Obstetrics and Gynecology

## 2018-10-24 DIAGNOSIS — N950 Postmenopausal bleeding: Secondary | ICD-10-CM | POA: Insufficient documentation

## 2018-10-24 DIAGNOSIS — R9389 Abnormal findings on diagnostic imaging of other specified body structures: Secondary | ICD-10-CM | POA: Insufficient documentation

## 2018-10-26 ENCOUNTER — Other Ambulatory Visit: Payer: Self-pay | Admitting: *Deleted

## 2018-10-26 ENCOUNTER — Other Ambulatory Visit: Payer: Self-pay | Admitting: Oncology

## 2018-10-26 DIAGNOSIS — R9389 Abnormal findings on diagnostic imaging of other specified body structures: Secondary | ICD-10-CM

## 2018-10-28 ENCOUNTER — Telehealth: Payer: Self-pay | Admitting: Oncology

## 2018-10-28 NOTE — Telephone Encounter (Signed)
Scheduled appt per 3/31 - unable to reach patient - left message for patient with call back number and appt date

## 2018-11-02 ENCOUNTER — Other Ambulatory Visit: Payer: Self-pay | Admitting: Oncology

## 2018-11-04 ENCOUNTER — Ambulatory Visit: Payer: Medicare Other | Admitting: Obstetrics and Gynecology

## 2018-11-04 ENCOUNTER — Other Ambulatory Visit: Payer: Self-pay

## 2018-11-04 ENCOUNTER — Ambulatory Visit (INDEPENDENT_AMBULATORY_CARE_PROVIDER_SITE_OTHER): Payer: Medicare Other

## 2018-11-04 VITALS — BP 140/80 | HR 80 | Ht 64.0 in | Wt 163.0 lb

## 2018-11-04 DIAGNOSIS — R9389 Abnormal findings on diagnostic imaging of other specified body structures: Secondary | ICD-10-CM

## 2018-11-04 DIAGNOSIS — N950 Postmenopausal bleeding: Secondary | ICD-10-CM

## 2018-11-04 NOTE — Progress Notes (Signed)
GYNECOLOGY  VISIT   HPI: 68 y.o.   Single  Caucasian  female   G0P0 with Patient's last menstrual period was 07/28/1996 (approximate).   here for   sonohysterogram and endometrial biopsy.   She is on Tamoxifen and has had postmenopausal bleeding.  Pelvic US on 10/22/18 showed thickened endometrium 13.73 mm with cystic spaces and increased vascular flow.  She also has several fibroids.  EMB showed atrophic endometrium and did not reflect Tamoxifen use, according to the lab.  GYNECOLOGIC HISTORY: Patient's last menstrual period was 07/28/1996 (approximate). Contraception:   NA Menopausal hormone therapy:  None.   Last mammogram:  03-04-16 bilateral breast cancer--Had Lt. mastectomy and Rt. Lumpectomy -- 04/22/18 Right Diagnostic MMG BIRADS 2 benign/density c Last pap smear:   01/30/17 Neg:Neg HR HPV         OB History    Gravida  0   Para      Term      Preterm      AB      Living        SAB      TAB      Ectopic      Multiple      Live Births                 Patient Active Problem List   Diagnosis Date Noted  . Postmenopausal bleeding 10/24/2018  . Thickened endometrium 10/24/2018  . Tear of biceps tendon 09/21/2018  . Moderate persistent asthma, uncomplicated 02/58/5277  . Mild persistent asthma, uncomplicated 82/42/3536  . Other seasonal allergic rhinitis 02/12/2017  . Malignant neoplasm of overlapping sites of left breast in female, estrogen receptor positive ([place]) 10/27/2016  . Sinusitis 09/01/2016  . Hyponatremia 09/01/2016  . Malignant neoplasm of upper-outer quadrant of right breast in female, estrogen receptor positive ([place]) 04/19/2016  . Acid reflux 11/20/2015  . Bunion of great toe of right foot 04/04/2015  . Hemorrhoid 11/23/2014  . Anal skin tag 11/23/2014  . Anal burning 11/23/2014  . Plantar fasciitis, right 05/11/2014  . Retrocalcaneal bursitis 05/11/2014  . Pain in joint, ankle and foot 05/11/2014  . Heart burn   . Joint pain   .  Abnormal Pap smear   . Osteopenia     Past Medical History:  Diagnosis Date  . Allergy-induced asthma    prn inhaler  . Asthma    allergy induced  . Bruises easily   . Dental crowns present   . Family history of adverse reaction to anesthesia    pt's father has hx. of being hard to wake up post-op  . GERD (gastroesophageal reflux disease)   . Hemorrhoids   . History of breast cancer 2017   bilateral  . History of radiation therapy    08/19/16- 09/29/16, Right Breast 50 Gy in 25 fractions, Right Breast boost 10 Gy in 5 fractions, Left Breast 50.4 Gy in 28 fractions  . Immature cataract   . Osteoarthritis    hands and feet  . Osteopenia 02/2016   T score -1.3 FRAX 8.3%/0.7% stable from prior DEXA  . Overactive bladder     Past Surgical History:  Procedure Laterality Date  . BREAST RECONSTRUCTION WITH PLACEMENT OF TISSUE EXPANDER AND FLEX HD (ACELLULAR HYDRATED DERMIS) Left 07/01/2016   Procedure: BREAST RECONSTRUCTION WITH PLACEMENT OF TISSUE EXPANDER AND  ALLODERM;  Surgeon: Irene Limbo, MD;  Location: [facility];  Service: Plastics;  Laterality: Left;  . INCISION AND DRAINAGE Left 06/02/2016  Procedure: DRAINAGE of left axilla seroma;  Surgeon: Rolm Bookbinder, MD;  Location: [facility];  Service: General;  Laterality: Left;  . LIPOSUCTION WITH LIPOFILLING Left 06/11/2018   Procedure: Lipofilling from abdomen to left chest;  Surgeon: Irene Limbo, MD;  Location: [facility];  Service: Plastics;  Laterality: Left;  Marland Kitchen MASTOPEXY Right 04/17/2017   Procedure: RIGHT MASTOPEXY;  Surgeon: Irene Limbo, MD;  Location: [facility];  Service: Plastics;  Laterality: Right;  . NIPPLE SPARING MASTECTOMY Left 07/01/2016   Procedure: LEFT NIPPLE SPARING MASTECTOMY;  Surgeon: Rolm Bookbinder, MD;  Location: [place];  Service: General;  Laterality: Left;  . RADIOACTIVE SEED GUIDED PARTIAL MASTECTOMY WITH  AXILLARY SENTINEL LYMPH NODE BIOPSY Right 03/26/2016   Procedure: RIGHT BREAST LUMPECTOMY WITH RADIOACTIVE SEED AND SENTINEL LYMPH NODE BIOPSY;  Surgeon: Rolm Bookbinder, MD;  Location: [place] [facility];  Service: General;  Laterality: Right;  . RADIOACTIVE SEED GUIDED PARTIAL MASTECTOMY WITH AXILLARY SENTINEL LYMPH NODE BIOPSY Left 05/20/2016   Procedure: LEFT BREAST RADIOACTIVE SEED (two seeds) GUIDED LUMPECTOMY WITH LEFT AXILLARY SENTINEL LYMPH NODE BIOPSY AND LEFT SEED TARGETED AXILLARY LYMPH NODE EXCISION;  Surgeon: Rolm Bookbinder, MD;  Location: [facility];  Service: General;  Laterality: Left;  . RE-EXCISION OF BREAST CANCER,SUPERIOR MARGINS Right 05/20/2016   Procedure: RE-EXCISION OF RIGHT BREAST MARGIN;  Surgeon: Rolm Bookbinder, MD;  Location: [facility];  Service: General;  Laterality: Right;  . RE-EXCISION OF BREAST CANCER,SUPERIOR MARGINS Left 06/02/2016   Procedure: RE-EXCISION OF BREAST CANCER,SUPERIOR MARGINS;  Surgeon: Rolm Bookbinder, MD;  Location: [facility];  Service: General;  Laterality: Left;  . REMOVAL OF TISSUE EXPANDER AND PLACEMENT OF IMPLANT Left 04/17/2017   Procedure: REMOVAL OF LEFT TISSUE EXPANDER WITH PLACEMENT OF LEFT SILICONE BREAST IMPLANT;  Surgeon: Irene Limbo, MD;  Location: [facility];  Service: Plastics;  Laterality: Left;    Current Outpatient Medications  Medication Sig Dispense Refill  . albuterol (PROAIR HFA) 108 (90 Base) MCG/ACT inhaler Inhale 2 puffs into the lungs every 4 (four) hours as needed for wheezing or shortness of breath. 1 Inhaler 1  . Azelastine-Fluticasone (DYMISTA) 137-50 MCG/ACT SUSP     . CALCIUM-MAGNESIUM PO Take by mouth.    . cetirizine (ZYRTEC) 10 MG tablet Take 10 mg by mouth.    . Cholecalciferol (VITAMIN D) 2000 units tablet Take 4,000 Units by mouth daily.     . diclofenac sodium (VOLTAREN) 1 % GEL Apply 2 g topically 3 (three) times daily. 100  g 0  . docusate sodium (COLACE) 50 MG capsule Take 100 mg by mouth daily.     Marland Kitchen FLOVENT HFA 110 MCG/ACT inhaler INHALE 1 PUFF BY MOUTH INTO THE LUNGS TWICE DAILY 12 g 0  . hydrocortisone (ANUSOL-HC) 2.5 % rectal cream Place 1 application rectally as needed for hemorrhoids or itching.    Marland Kitchen OVER THE COUNTER MEDICATION Take by mouth daily. FLORA RESTORE    . OVER THE COUNTER MEDICATION Take by mouth daily. [place]    . PRILOSEC OTC 20 MG tablet Reported on 12/14/2015    . psyllium (METAMUCIL) 58.6 % packet Take 1 packet by mouth daily.    . tamoxifen (NOLVADEX) 20 MG tablet TAKE 1 TABLET(20 MG) BY MOUTH DAILY 90 tablet 4  . venlafaxine XR (EFFEXOR-XR) 75 MG 24 hr capsule TAKE 1 CAPSULE(75 MG) BY MOUTH DAILY WITH BREAKFAST 90 capsule 0   No current facility-administered medications for  this visit.      ALLERGIES: Hydrocodone; Hydrocodone-acetaminophen; Penicillins; Adhesive [tape]; and Augmentin [amoxicillin-pot clavulanate]  Family History  Problem Relation Age of Onset  . Hypertension Mother   . Heart disease Mother   . Lung disease Mother   . Hypertension Father   . Cancer Father        colon  . Heart disease Father   . Lung cancer Father   . Anesthesia problems Father        hard to wake up post-op  . Breast cancer Paternal Grandmother        Age 19's  . Hypertension Brother   . Hyperlipidemia Brother   . Diabetes Maternal Grandfather     Social History   Socioeconomic History  . Marital status: Single    Spouse name: Not on file  . Number of children: Not on file  . Years of education: Not on file  . Highest education level: Not on file  Occupational History  . Not on file  Social Needs  . Financial resource strain: Not on file  . Food insecurity:    Worry: Not on file    Inability: Not on file  . Transportation needs:    Medical: Not on file    Non-medical: Not on file  Tobacco Use  . Smoking status: Former Smoker    Last attempt to quit: 07/27/1990     Years since quitting: 28.2  . Smokeless tobacco: Never Used  Substance and Sexual Activity  . Alcohol use: Yes    Comment: 2 beers daily  . Drug use: No  . Sexual activity: Not Currently    Partners: Male    Birth control/protection: Post-menopausal    Comment: 1st intercourse 52 yo-5 partners  Lifestyle  . Physical activity:    Days per week: Not on file    Minutes per session: Not on file  . Stress: Not on file  Relationships  . Social connections:    Talks on phone: Not on file    Gets together: Not on file    Attends religious service: Not on file    Active member of club or organization: Not on file    Attends meetings of clubs or organizations: Not on file    Relationship status: Not on file  . Intimate partner violence:    Fear of current or ex partner: Not on file    Emotionally abused: Not on file    Physically abused: Not on file    Forced sexual activity: Not on file  Other Topics Concern  . Not on file  Social History Narrative  . Not on file    Review of Systems  See HPI.   Otherwise negative.  PHYSICAL EXAMINATION:    BP 140/80   Pulse 80   Ht 5\' 4"  (1.626 m)   Wt 163 lb (73.9 kg)   LMP 07/28/1996 (Approximate) Comment: pmb bleeding  BMI 27.98 kg/m     General appearance: alert, cooperative and appears stated age   Pelvic: External genitalia:  no lesions              Urethra:  normal appearing urethra with no masses, tenderness or lesions              Bartholins and Skenes: normal                 Vagina: normal appearing vagina with normal color and discharge, no lesions  Cervix: no lesions   Sonohysterogram and endometrial biopsy.  Pelvic US - 22 mm thickness of endometrium.  Ovaries normal.  Consent for procedures.  Sterile prep with Hibiclens. Paracervical block with 10 cc lidocaine 1%, lot number [id number], expiration 08/04/19. Cannula inserted and saline injected and 29 x 15 mm mass noted on posterior endometrial wall.  Speculum  placed again and reprep of cervix with Hibiclens.  Tenaculum to anterior cervical lip. Rocket endometrial biopsy performed to almost 7 cm twice.  Tissue to pathology.  Minimal EBL. No complications.   Chaperone was present for exam.  ASSESSMENT  Postmenopausal bleeding on Tamoxifen.  Thickened endometrium with suspected mass of posterior wall.  Fibroids.  PLAN  FU EMB result.  Instructions and precautions given.  Final plan to follow after results are back.  May still need dilation and curettage, but surgical care is temporarily restricted during the Covid 19 crisis.  If malignancy found, will refer to GYN ONC.   An After Visit Summary was printed and given to the patient.  __15____ minutes face to face time of which over 50% was spent in counseling.

## 2018-11-04 NOTE — Progress Notes (Signed)
Encounter reviewed by Dr. Brook Amundson C. Silva.  

## 2018-11-04 NOTE — Patient Instructions (Signed)

## 2018-11-05 ENCOUNTER — Encounter: Payer: Self-pay | Admitting: Obstetrics and Gynecology

## 2018-11-10 ENCOUNTER — Other Ambulatory Visit: Payer: Self-pay | Admitting: Oncology

## 2018-11-16 ENCOUNTER — Telehealth: Payer: Self-pay | Admitting: Oncology

## 2018-11-16 NOTE — Telephone Encounter (Signed)
Spoke with patient and she already had webex set up. Sent her the email with the join link.

## 2018-11-17 NOTE — Progress Notes (Addendum)
[place]  [phone] [phone]    ID: BRYLEIGH OTTAWAY DOB: 1950/09/15  MR#: [id number]  [id number]#:[id number]  Patient Care Team: Josetta Huddle, MD as PCP - General (Internal Medicine) Rolm Bookbinder, MD as Consulting Physician (General Surgery) Nehemiah Montee, Virgie Dad, MD as Consulting Physician (Oncology) Phineas Real, Belinda Block, MD as Consulting Physician (Gynecology) Delice Bison, Charlestine Massed, NP as Nurse Practitioner (Hematology and Oncology) Yisroel Ramming, Everardo All, MD as Consulting Physician (Obstetrics and Gynecology) OTHER MD:   CHIEF COMPLAINT: Estrogen receptor positive breast cancer  CURRENT TREATMENT: Anastrozole   I connected with [name] on 11/18/18 at 10:00 AM EDT by video enabled telemedicine visit and verified that I am speaking with the correct person using two identifiers.   I discussed the limitations, risks, security and privacy concerns of performing an evaluation and management service by telemedicine and the availability of in-person appointments. I also discussed with the patient that there may be a patient responsible charge related to this service. The patient expressed understanding and agreed to proceed.   Other persons participating in the visit and their role in the encounter:   - Biomedical scientist, Medical Scribe   Patient's location: home  Provider's location: [facility]   Chief Complaint: Estrogen receptor positive breast cancer    BREAST CANCER HISTORY: From the earlier summary note:  "[name]" had screening mammography at her gynecologist's office suggestive of a change in the upper-outer quadrant of the right breast. She was referred to [facility] where on 03/04/2016 she underwent bilateral diagnostic mammography and right breast ultrasonography. The breast density was category C. In the upper outer quadrant of the right breast there was a new area of architectural distortion. By ultrasonography this  measured 0.5 cm. The right axilla was reported as sonographically benign.  On 03/06/2016 she underwent core needle biopsy of the right breast mass in question, and this showed ([id number]) an invasive ductal carcinoma, grade 1, estrogen receptor 95% positive, progesterone receptor 95% positive, both with strong staining intensity, with an MIB-1 of 12%, and no HER-2 amplification, the signals ratio being 1.54 and the number per cell 2.15.  Her case was presented in the multidisciplinary breast cancer conference 03/12/2016.Marland Kitchen At that time a preliminary plan was proposed: Breast conserving surgery with sentinel lymph node sampling, no Oncotype if the tumor was no larger than 5 mm, and consideration of genetics testing.  On 03/26/2016 [name] underwent right lumpectomy and sentinel lymph node sampling. This showed ([id number]) an invasive ductal carcinoma measuring 1.4 cm, grade 1, with negative margins, and a macrometastatic deposit of ductal carcinoma in the single sentinel lymph node. Mammaprint from this tumor was read as "low risk".  However there was a separate invasive lobular carcinoma measuring 0.6 cm,  focally involving the final right medial margin. Because of the lobular breast cancer bilateral breast MRI was obtained 04/04/2016. This showed in the right breast a 7.4 x 4.5 cm seroma with 2 enhancing masses posterior to the lumpectomy cavity, the larger measuring 1.0 cm. These were felt possibly to be reactive lymph nodes. One of these lymph nodes was biopsied 04/09/2016 and this showed ([id number]) benign lymph node tissue. The MRI showed no evidence of additional disease in the right breast.  However in the left breast centrally there was a 1.3 cm enhancing mass. Biopsy of this left breast mass 04/09/2016 ([id number]) showed an invasive ductal carcinoma, grade 1 or 2, with insufficient tissue for a prognostic panel. On 04/11/2016  biopsy of a second left breast mass, upper outer quadrant, 4.6  cm a way from the other left breast mass, showed ([id number]) an invasive ductal carcinoma, grade 2, estrogen receptor 10% positive with strong staining intensity, progesterone receptor negative, with an MIB-1 of 2%, and no HER-2 amplification, the signals ratio being 1.50 and the number per cell 2.10. The proliferation fraction was 2%.  On 04/14/2016 [name] underwent biopsy of a suspicious left axillary lymph node and this ([id number]) was positive for invasive ductal carcinoma, estrogen and progesterone receptor negative, with an MIB-1 of 10%. I do not find that HER-2 was repeated. Mammaprint from this tumor also came back low risk.  Her subsequent history is as detailed below.   INTERVAL HISTORY: Shameika is seen today for follow-up and treatment of her estrogen receptor positive breast cancer.   She was on tamoxifen, however she developed some postmenopausal bleeding about 4 to 6 weeks ago and is being evaluated for endometrial hyperplasia versus cancer. I have asked her to hold the tamoxifen (she stopped the week of 11/08/2018), so that today and we can discuss whether to continue it or switch to an aromatase inhibitor.  She underwent a biopsy of the endometrium on 10/22/2018 for post menopausal bleeding. The pathology from this procedure showed ([id number]): Endometrium, biopsy - atrophic endometrium with breakdown. - scant benign endocervical epithelium and squamous debris. - no endometrial hyperplasia, atypia, cervical dysplasia or malignancy identified.   - see comment: The biopsy does not show features of a tamoxifen-associated endometrial polyp  Following that she underwent a sonohysterogram with biopsy on 11/04/2018. The pathology from this procedure showed ([id number]):  Endometrium, biopsy - atrophic appearing endometrium.   - there is no evidence of hyperplasia or malignancy.  In addition since her last visit here, she underwent a digital diagnostic unilateral left mammogram  and a left breast ultrasound on 07/14/2018 showing: Breast Density Category C. There is no mammographic evidence for malignancy. Palpable areas in the left axilla likely represent benign fat necrosis. Sonography shows palpable left axillary areas correspond to heterogenous, predominately hypoechoic tissue mostly suggestive of fat necrosis. A 6 month follow up is recommended to document stability.   She also underwent an ultrasound of the right shoulder on 09/21/2018 showing: High grade bicipital tendon tear Right shoulder.  Annisten notes that Dr. Quincy Simmonds recommended that she see a gynecological oncologist, but the patient is reluctant to proceed at this point.   REVIEW OF SYSTEMS: Shalaunda likes to walk outside when it is not raining. She has also been going to golf a few times per week. The patient denies unusual headaches, visual changes, nausea, vomiting, or dizziness. There has been no unusual cough, phlegm production, or pleurisy. This been no change in bowel or bladder habits. The patient denies unexplained fatigue or unexplained weight loss, bleeding, rash, or fever. A detailed review of systems was otherwise noncontributory.     PAST MEDICAL HISTORY: Past Medical History:  Diagnosis Date  . Allergy-induced asthma    prn inhaler  . Asthma    allergy induced  . Bruises easily   . Dental crowns present   . Family history of adverse reaction to anesthesia    pt's father has hx. of being hard to wake up post-op  . GERD (gastroesophageal reflux disease)   . Hemorrhoids   . History of breast cancer 2017   bilateral  . History of radiation therapy    08/19/16- 09/29/16, Right Breast 50 Gy in 25 fractions, Right Breast  boost 10 Gy in 5 fractions, Left Breast 50.4 Gy in 28 fractions  . Immature cataract   . Osteoarthritis    hands and feet  . Osteopenia 02/2016   T score -1.3 FRAX 8.3%/0.7% stable from prior DEXA  . Overactive bladder     PAST SURGICAL HISTORY: Past Surgical History:   Procedure Laterality Date  . BREAST RECONSTRUCTION WITH PLACEMENT OF TISSUE EXPANDER AND FLEX HD (ACELLULAR HYDRATED DERMIS) Left 07/01/2016   Procedure: BREAST RECONSTRUCTION WITH PLACEMENT OF TISSUE EXPANDER AND  ALLODERM;  Surgeon: Irene Limbo, MD;  Location: [place];  Service: Plastics;  Laterality: Left;  . INCISION AND DRAINAGE Left 06/02/2016   Procedure: DRAINAGE of left axilla seroma;  Surgeon: Rolm Bookbinder, MD;  Location: [place];  Service: General;  Laterality: Left;  . LIPOSUCTION WITH LIPOFILLING Left 06/11/2018   Procedure: Lipofilling from abdomen to left chest;  Surgeon: Irene Limbo, MD;  Location: [place];  Service: Plastics;  Laterality: Left;  Marland Kitchen MASTOPEXY Right 04/17/2017   Procedure: RIGHT MASTOPEXY;  Surgeon: Irene Limbo, MD;  Location: [place];  Service: Plastics;  Laterality: Right;  . NIPPLE SPARING MASTECTOMY Left 07/01/2016   Procedure: LEFT NIPPLE SPARING MASTECTOMY;  Surgeon: Rolm Bookbinder, MD;  Location: [place];  Service: General;  Laterality: Left;  . RADIOACTIVE SEED GUIDED PARTIAL MASTECTOMY WITH AXILLARY SENTINEL LYMPH NODE BIOPSY Right 03/26/2016   Procedure: RIGHT BREAST LUMPECTOMY WITH RADIOACTIVE SEED AND SENTINEL LYMPH NODE BIOPSY;  Surgeon: Rolm Bookbinder, MD;  Location: [place];  Service: General;  Laterality: Right;  . RADIOACTIVE SEED GUIDED PARTIAL MASTECTOMY WITH AXILLARY SENTINEL LYMPH NODE BIOPSY Left 05/20/2016   Procedure: LEFT BREAST RADIOACTIVE SEED (two seeds) GUIDED LUMPECTOMY WITH LEFT AXILLARY SENTINEL LYMPH NODE BIOPSY AND LEFT SEED TARGETED AXILLARY LYMPH NODE EXCISION;  Surgeon: Rolm Bookbinder, MD;  Location: [place];  Service: General;  Laterality: Left;  . RE-EXCISION OF BREAST CANCER,SUPERIOR MARGINS Right 05/20/2016   Procedure: RE-EXCISION OF RIGHT BREAST MARGIN;  Surgeon: Rolm Bookbinder, MD;  Location: [place];  Service: General;  Laterality: Right;  . RE-EXCISION OF BREAST CANCER,SUPERIOR MARGINS Left 06/02/2016   Procedure: RE-EXCISION OF BREAST CANCER,SUPERIOR MARGINS;  Surgeon: Rolm Bookbinder, MD;  Location: [place];  Service: General;  Laterality: Left;  . REMOVAL OF TISSUE EXPANDER AND PLACEMENT OF IMPLANT Left 04/17/2017   Procedure: REMOVAL OF LEFT TISSUE EXPANDER WITH PLACEMENT OF LEFT SILICONE BREAST IMPLANT;  Surgeon: Irene Limbo, MD;  Location: [place];  Service: Plastics;  Laterality: Left;    FAMILY HISTORY Family History  Problem Relation Age of Onset  . Hypertension Mother   . Heart disease Mother   . Lung disease Mother   . Hypertension Father   . Cancer Father        colon  . Heart disease Father   . Lung cancer Father   . Anesthesia problems Father        hard to wake up post-op  . Breast cancer Paternal Grandmother        Age 37's  . Hypertension Brother   . Hyperlipidemia Brother   . Diabetes Maternal Grandfather    The patient's father died from lung cancer at the age of 67. He also had a remote history of colon cancer. His mother had a history of breast cancer in her 51s. The patient's mother died at age 92. The patient has one  brother, no sisters.  GYNECOLOGIC HISTORY:  Patient's last menstrual period was 07/28/1996 (approximate). Menarche age 46, the patient is GX P0. 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 She lives alone, with no pets.   ADVANCED DIRECTIVES: In place. She has named her brother Marya Amsler as her healthcare part of attorney. He can be reached at [phone]   HEALTH MAINTENANCE: Social History   Tobacco Use  . Smoking status: Former Smoker    Last attempt to quit:  07/27/1990    Years since quitting: 28.3  . Smokeless tobacco: Never Used  Substance Use Topics  . Alcohol use: Yes    Comment: 2 beers daily  . Drug use: No     Colonoscopy: February 2016  PAP:  Bone density: 04/22/2018; -1.9 osteopenia   Allergies  Allergen Reactions  . Hydrocodone Other (See Comments)    Restlessness, "energy"  . Hydrocodone-Acetaminophen Other (See Comments)  . Penicillins Itching  . Adhesive [Tape] Other (See Comments)    SKIN IRRITATION  . Augmentin [Amoxicillin-Pot Clavulanate] Itching    Current Outpatient Medications  Medication Sig Dispense Refill  . albuterol (PROAIR HFA) 108 (90 Base) MCG/ACT inhaler Inhale 2 puffs into the lungs every 4 (four) hours as needed for wheezing or shortness of breath. 1 Inhaler 1  . anastrozole (ARIMIDEX) 1 MG tablet Take 1 tablet (1 mg total) by mouth daily. 90 tablet 4  . Azelastine-Fluticasone (DYMISTA) 137-50 MCG/ACT SUSP     . CALCIUM-MAGNESIUM PO Take by mouth.    . cetirizine (ZYRTEC) 10 MG tablet Take 10 mg by mouth.    . Cholecalciferol (VITAMIN D) 2000 units tablet Take 4,000 Units by mouth daily.     . diclofenac sodium (VOLTAREN) 1 % GEL Apply 2 g topically 3 (three) times daily. 100 g 0  . docusate sodium (COLACE) 50 MG capsule Take 100 mg by mouth daily.     Marland Kitchen FLOVENT HFA 110 MCG/ACT inhaler INHALE 1 PUFF BY MOUTH INTO THE LUNGS TWICE DAILY 12 g 0  . hydrocortisone (ANUSOL-HC) 2.5 % rectal cream Place 1 application rectally as needed for hemorrhoids or itching.    Marland Kitchen OVER THE COUNTER MEDICATION Take by mouth daily. FLORA RESTORE    . OVER THE COUNTER MEDICATION Take by mouth daily. [place]    . PRILOSEC OTC 20 MG tablet Reported on 12/14/2015    . psyllium (METAMUCIL) 58.6 % packet Take 1 packet by mouth daily.    Marland Kitchen venlafaxine XR (EFFEXOR-XR) 75 MG 24 hr capsule TAKE 1 CAPSULE(75 MG) BY MOUTH DAILY WITH BREAKFAST 90 capsule 0   No current facility-administered medications for this visit.      OBJECTIVE: Middle-aged white woman who looks well  There were no vitals filed for this visit.   There is no height or weight on file to calculate BMI.    ECOG FS:1 - Symptomatic but completely ambulatory   LAB RESULTS:  CMP     Component Value Date/Time   NA 136 07/12/2018 1119   NA 136 03/12/2017 1115   K 4.2 07/12/2018 1119   K 4.0 03/12/2017 1115   CL 102 07/12/2018 1119   CO2 24 07/12/2018 1119   CO2 25 03/12/2017 1115   GLUCOSE 94 07/12/2018 1119   GLUCOSE 78 03/12/2017 1115   BUN 12 07/12/2018 1119  BUN 12.4 03/12/2017 1115   CREATININE 0.75 07/12/2018 1119   CREATININE 0.7 03/12/2017 1115   CALCIUM 9.0 07/12/2018 1119   CALCIUM 9.1 03/12/2017 1115   PROT 6.4 (L) 07/12/2018 1119   PROT 6.6 03/12/2017 1115   ALBUMIN 3.6 07/12/2018 1119   ALBUMIN 3.6 03/12/2017 1115   AST 21 07/12/2018 1119   AST 34 03/12/2017 1115   ALT 20 07/12/2018 1119   ALT 33 03/12/2017 1115   ALKPHOS 58 07/12/2018 1119   ALKPHOS 57 03/12/2017 1115   BILITOT 0.4 07/12/2018 1119   BILITOT 0.41 03/12/2017 1115   GFRNONAA >60 07/12/2018 1119   GFRAA >60 07/12/2018 1119    INo results found for: SPEP, UPEP  Lab Results  Component Value Date   WBC 5.6 07/12/2018   NEUTROABS 3.9 07/12/2018   HGB 13.4 07/12/2018   HCT 40.9 07/12/2018   MCV 92.7 07/12/2018   PLT 291 07/12/2018      Chemistry      Component Value Date/Time   NA 136 07/12/2018 1119   NA 136 03/12/2017 1115   K 4.2 07/12/2018 1119   K 4.0 03/12/2017 1115   CL 102 07/12/2018 1119   CO2 24 07/12/2018 1119   CO2 25 03/12/2017 1115   BUN 12 07/12/2018 1119   BUN 12.4 03/12/2017 1115   CREATININE 0.75 07/12/2018 1119   CREATININE 0.7 03/12/2017 1115      Component Value Date/Time   CALCIUM 9.0 07/12/2018 1119   CALCIUM 9.1 03/12/2017 1115   ALKPHOS 58 07/12/2018 1119   ALKPHOS 57 03/12/2017 1115   AST 21 07/12/2018 1119   AST 34 03/12/2017 1115   ALT 20 07/12/2018 1119   ALT 33 03/12/2017 1115   BILITOT  0.4 07/12/2018 1119   BILITOT 0.41 03/12/2017 1115       No results found for: LABCA2  No components found for: LABCA125  No results for input(s): INR in the last 168 hours.  Urinalysis    Component Value Date/Time   COLORURINE YELLOW 01/31/2016 1526   APPEARANCEUR CLEAR 01/31/2016 1526   LABSPEC 1.006 01/31/2016 1526   PHURINE 6.5 01/31/2016 1526   GLUCOSEU NEGATIVE 01/31/2016 1526   HGBUR NEGATIVE 01/31/2016 1526   BILIRUBINUR NEGATIVE 01/31/2016 1526   KETONESUR NEGATIVE 01/31/2016 1526   PROTEINUR NEGATIVE 01/31/2016 1526   UROBILINOGEN 0.2 12/11/2014 1506   NITRITE NEGATIVE 01/31/2016 1526   LEUKOCYTESUR TRACE (A) 01/31/2016 1526     STUDIES: [place] Sonohysterogram  Result Date: 11/04/2018 SEE PROGRESS NOTE    ELIGIBLE FOR AVAILABLE RESEARCH PROTOCOL: no   ASSESSMENT: 68 y.o. [place] woman status post right breast upper outer quadrant lumpectomy 03/26/2016 for a pT1c pN1, stage IIA invasive ductal carcinoma, grade 1, estrogen and progesterone receptor positive, HER-2 nonamplified, with an MIB-1 of 12%   (a) mammaprint from this tumor was read as "low risk".   (1) a second right breast cancer, invasive lobular, grade 1, measuring 0.6 cm, focally involved the final right medial margin from the 03/26/2016 procedure; this tumor also was estrogen and progesterone receptor positive, HER-2 negative, with an MIB-1 of 3%  (a) medial margin cleared with additional surgery 05/20/2016.  (2) two biopsies from the left breast 04/09/2016 and 04/11/2016, 4.6 cm apart, showed  (a) centrally, an invasive ductal carcinoma, grade 1 or 2, with no prognostic panel available  (b) in the upper outer quadrant, invasive ductal carcinoma, grade 2, estrogen receptor 10% positive, progesterone receptor and HER-2 negative, with an MIB-1 of 2%  (  c) left axillary lymph node biopsy 04/14/2016 showed invasive ductal carcinoma, estrogen and progesterone receptor negative  (d) mammaprint from (2)(c)  was also "low risk"  (3) left lumpectomy 05/20/2016 showed a  pT1c pN1 stage IIA  invasive ductal carcinoma, grade 2, estrogen receptor positive, progesterone receptor negative, HER-2 nonamplified, with an MIB-1 of 2%; margins were positive   (4) status post left sided nipple sparing mastectomy with immediate expander placement 07/01/2016 showing multiple foci of residual grade 2 invasive ductal carcinoma, the largest measuring 0.7 cm, with evidence of lymphovascular invasion and multiple close but negative margins  (5) radiation therapy 08/19/16-03/05/18with capecitabine sensitization Site/dose:   1) Right breast/ 50Gy in 25 fractions                         2) Right breast boost/ 10 Gy in 5 fractions                         3) Left breast 50.4 Gy in 28 fractions   (6) tamoxifen started 04/18/2016--to be continued a minimum of 5 years, likely followed by anastrozole x 2 years  (a) endometrial thickening and polyp noted March 2020  (b) discontinued April 2020  (7) anastrozole started 11/18/2018  (a) osteopenia, with T score -1.9 on bone density scan on 04/22/2018   PLAN: Alison Murray is now 2-1/4 years out from definitive surgery for her bilateral breast cancers.  There is no evidence of disease recurrence.  This is favorable.  She has tolerated tamoxifen well and is not eager to change.  However I am concerned about her vaginal bleeding and she understands this is a cardinal syndrome for endometrial carcinoma.  She has been evaluated for this and we do not find any evidence of cancer but that still leaves the bleeding unexplained so we discussed the possibility of referral to a gynecologic oncologist (we might recommend hysterectomy) versus waiting and repeating a pelvic/trans-vaginal ultrasound in a couple of months.  That is actually her preference at this point.  She has not noted any change since going off tamoxifen.  We discussed anastrozole in detail today.  She understands the mechanism of  action, and the possible toxicities, side effects and complications of this agent.  She is going to go ahead and started and she will call us if she has any significant problems.  She will need a repeat bone density September of next year.  Otherwise I will see her again in 2 months.  Before that visit she will have her pelvic and transvaginal ultrasonography again and we will again discuss further Dr. Elza Rafter recommendation of gynecologic oncology referral    Angelos Wasco, Virgie Dad, MD  11/18/18 10:55 AM Medical Oncology and Hematology [facility] [place], [place] [address] Tel. [phone]    Fax. [phone]  I, Jacqualyn Posey am acting as a Education administrator for Chauncey Cruel, MD.   I, Lurline Del MD, have reviewed the above documentation for accuracy and completeness, and I agree with the above.

## 2018-11-18 ENCOUNTER — Inpatient Hospital Stay: Payer: Medicare Other | Attending: Oncology | Admitting: Oncology

## 2018-11-18 DIAGNOSIS — Z170 Estrogen receptor positive status [ER+]: Secondary | ICD-10-CM | POA: Diagnosis not present

## 2018-11-18 DIAGNOSIS — C50812 Malignant neoplasm of overlapping sites of left female breast: Secondary | ICD-10-CM

## 2018-11-18 DIAGNOSIS — R9389 Abnormal findings on diagnostic imaging of other specified body structures: Secondary | ICD-10-CM

## 2018-11-18 DIAGNOSIS — C50411 Malignant neoplasm of upper-outer quadrant of right female breast: Secondary | ICD-10-CM | POA: Diagnosis not present

## 2018-11-18 MED ORDER — ANASTROZOLE 1 MG PO TABS: 1.0000 mg | ORAL_TABLET | Freq: Every day | ORAL | 4 refills | Status: DC

## 2018-11-19 ENCOUNTER — Telehealth: Payer: Self-pay | Admitting: Obstetrics and Gynecology

## 2018-11-19 NOTE — Telephone Encounter (Signed)
Phone contact with patient in follow up after her visit with Dr. Jana Hakim, her oncologist.   Patient has postmenopausal bleeding, endometrial thickening, and suspicion for a potential endometrial mass, tissue of which is not being captured with office endometrial sampling x 2.   I have recommended she have consultation with GYN ONC as she is on Tamoxifen and has had nondiagnostic biopsies.   I recommend proceeding in this direction as the most efficient way to proceed with surgical care of any kind, whether she proceeds with a hysteroscopy/dilation and curettage or more comprehensive surgery such as hysterectomy, which was mentioned in her consultation note with Dr. Jana Hakim.   She is now on Anastrozole, and she has follow up at the end of June with Dr. Jana Hakim.   She agrees to [place] consultation, hopefully through a WebEx visit.   I will have my office work on this next week.

## 2018-11-22 ENCOUNTER — Telehealth: Payer: Self-pay | Admitting: Oncology

## 2018-11-22 NOTE — Telephone Encounter (Signed)
Spoke with Sharyn Lull at [facility] who requests referral information be faxed to [phone] for review. Return call to the office tomorrow regarding an appointment for the patient. Referral information faxed with confirmation.

## 2018-11-22 NOTE — Telephone Encounter (Signed)
Tried to reach regarding schedule °

## 2018-11-24 ENCOUNTER — Telehealth: Payer: Self-pay

## 2018-11-24 ENCOUNTER — Telehealth: Payer: Self-pay | Admitting: Obstetrics and Gynecology

## 2018-11-24 NOTE — Telephone Encounter (Signed)
I received this message from Dr. Denman George yesterday recommending my proceeding with a hysteroscopy with dilation and curettage as the next step in her care.  Please let patient know that this is the plan.  She is a priority patient in terms of scheduling this procedure.  The hospital is now working on re-entry of surgical patients to care in this Covid 19 epidemic, hopefully beginning in May.   Please let her know that we will contact her back next week to discuss a potential surgical date.  She will be required to quarantine for 14 days prior to surgery and then have Covid 19 testing prior to proceeding.  This is a formal process that the hospital is outlining to keep patients safe.   Cc - Lamont Snowball

## 2018-11-24 NOTE — Telephone Encounter (Signed)
Dr

## 2018-11-24 NOTE — Telephone Encounter (Signed)
Reino Kent with Dr. Elza Rafter office that Dr. Denman George is requesting a hysteroscopy with a D&C be performed on Sabrina Tapia.  If pathology returns with a cancer diagnosis, the patient can be re refered to GYN Oncology. Dr. Denman George has sent a message regarding her recommendation. Mendel Ryder will give this information to Dr. Quincy Simmonds.

## 2018-11-24 NOTE — Telephone Encounter (Signed)
See previous telephone encounter with referral information. Routed to [facility] for review and advise.

## 2018-11-24 NOTE — Telephone Encounter (Signed)
Nita Sickle N routed conversation to You 10 minutes ago (9:17 AM)    Nita Sickle N 10 minutes ago (9:17 AM)      Sabrina Tapia with Barbaraann Share at Dr. Serita Grit office regarding patient's referral. Statethat [name] needs a hysteroscopy with [facility]. If pathology shows cancerous cells, she can be re-referred to her office.

## 2018-11-24 NOTE — Telephone Encounter (Signed)
Spoke with Barbaraann Share at Dr. Serita Grit office regarding patient's referral. States that Ms Billiter needs a hysteroscopy with [facility]. If pathology shows cancerous cells, she can be re-referred to her office.

## 2018-11-24 NOTE — Telephone Encounter (Signed)
Call to patient. Advised of recommendations from Dr Quincy Simmonds and Dr Denman George. Discussed potential re-opening of surgery centers following Covid 19. Dates may be available week of May 19. Patient states she has been very careful when out in public spaces. Has been to grocery store with mask and gloves.  Has played golf following social distancing.  Advised golf may not meet criteria and that expect will have additional information end of week or early next week.

## 2018-11-25 NOTE — Telephone Encounter (Signed)
Thank you for the update.  Cc- Lamont Snowball

## 2018-12-01 ENCOUNTER — Encounter (HOSPITAL_BASED_OUTPATIENT_CLINIC_OR_DEPARTMENT_OTHER): Payer: Self-pay

## 2018-12-01 ENCOUNTER — Other Ambulatory Visit: Payer: Self-pay

## 2018-12-01 NOTE — Telephone Encounter (Signed)
Call to patient. Confirmed surgery is scheduled for 12-06-18 at 0835 at [facility]. Surgery instruction sheet reviewed and printed copy will be provided at consult appointment tomorrow. Advised surgery center will contact her regarding policies with Covid 19.  Routing to Dr Quincy Simmonds. Encounter closed.

## 2018-12-01 NOTE — Progress Notes (Signed)
SPOKE W/  [name]     SCREENING SYMPTOMS OF COVID 19:   COUGH--NO  RUNNY NOSE--- NO  SORE THROAT---NO  NASAL CONGESTION----NO  SNEEZING----NO  SHORTNESS OF BREATH---NO  DIFFICULTY BREATHING---NO  TEMP >100.0 -----NO  UNEXPLAINED BODY ACHES------NO  CHILLS -------- NO  HEADACHES ---------NO  LOSS OF SMELL/ TASTE --------NO    HAVE YOU OR ANY FAMILY MEMBER TRAVELLED PAST 14 DAYS OUT OF THE   COUNTY---NO STATE----NO COUNTRY----NO  HAVE YOU OR ANY FAMILY MEMBER BEEN EXPOSED TO ANYONE WITH COVID 19? NO

## 2018-12-01 NOTE — Progress Notes (Signed)
Spoke with: Wannetta NPO:  No food after midnight/Clear liquids until 4:30AM DOS Arrival time: 0630AM Labs: COVID (CBC,BMP, 12/02/2018 in epic)  AM medications: Anastrozole, Prilosec, Venlafaxine, Nasal spray, Inhalers Pre op orders: Yes Ride home: Marya Amsler (brother) [phone]

## 2018-12-02 ENCOUNTER — Other Ambulatory Visit (HOSPITAL_COMMUNITY)
Admission: RE | Admit: 2018-12-02 | Discharge: 2018-12-02 | Disposition: A | Discharge status: Home / Self Care | Payer: Medicare Other | Source: Ambulatory Visit | Attending: Obstetrics and Gynecology | Admitting: Obstetrics and Gynecology

## 2018-12-02 ENCOUNTER — Encounter (HOSPITAL_COMMUNITY)
Admission: RE | Admit: 2018-12-02 | Discharge: 2018-12-02 | Disposition: A | Discharge status: Home / Self Care | Payer: Medicare Other | Source: Ambulatory Visit | Attending: Obstetrics and Gynecology | Admitting: Obstetrics and Gynecology

## 2018-12-02 ENCOUNTER — Encounter: Payer: Self-pay | Admitting: Obstetrics and Gynecology

## 2018-12-02 ENCOUNTER — Ambulatory Visit: Payer: Medicare Other | Admitting: Obstetrics and Gynecology

## 2018-12-02 VITALS — BP 138/84 | HR 76 | Temp 98.4°F | Ht 64.0 in | Wt 164.2 lb

## 2018-12-02 DIAGNOSIS — N950 Postmenopausal bleeding: Secondary | ICD-10-CM

## 2018-12-02 DIAGNOSIS — Z1159 Encounter for screening for other viral diseases: Secondary | ICD-10-CM | POA: Diagnosis not present

## 2018-12-02 DIAGNOSIS — Z01812 Encounter for preprocedural laboratory examination: Secondary | ICD-10-CM | POA: Insufficient documentation

## 2018-12-02 DIAGNOSIS — N9489 Other specified conditions associated with female genital organs and menstrual cycle: Secondary | ICD-10-CM

## 2018-12-02 LAB — CBC
HCT: 42.2 % (ref 36.0–46.0)
Hemoglobin: 13.8 g/dL (ref 12.0–15.0)
MCH: 31.6 pg (ref 26.0–34.0)
MCHC: 32.7 g/dL (ref 30.0–36.0)
MCV: 96.6 fL (ref 80.0–100.0)
Platelets: 365 10*3/uL (ref 150–400)
RBC: 4.37 MIL/uL (ref 3.87–5.11)
RDW: 14.1 % (ref 11.5–15.5)
WBC: 5.4 10*3/uL (ref 4.0–10.5)
nRBC: 0 % (ref 0.0–0.2)

## 2018-12-02 LAB — BASIC METABOLIC PANEL
Anion gap: 6 (ref 5–15)
BUN: 18 mg/dL (ref 8–23)
CO2: 22 mmol/L (ref 22–32)
Calcium: 9.1 mg/dL (ref 8.9–10.3)
Chloride: 109 mmol/L (ref 98–111)
Creatinine, Ser: 0.77 mg/dL (ref 0.44–1.00)
GFR calc Af Amer: >60 mL/min (ref >60)
GFR calc non Af Amer: >60 mL/min (ref >60)
Glucose, Bld: 100 mg/dL — ABNORMAL HIGH (ref 70–99)
Potassium: 4 mmol/L (ref 3.5–5.1)
Sodium: 137 mmol/L (ref 135–145)

## 2018-12-02 NOTE — Progress Notes (Addendum)
GYNECOLOGY  VISIT   HPI: 68 y.o.   Single  Caucasian  female   G0P0 with Patient's last menstrual period was 07/28/1996 (approximate).   here for pre op for 12/06/18 Dilatation and Curettage/Hysteroscopy with Myosure.  She has postmenopausal bleeding and has had benign endometrial sampling twice which does not reflect the endometrial thickening and mass seen on pelvic US.  Has multiple small fibroids and normal ovaries.   She was on Tamoxifen until recently.  She is now taking Arimidex following her postmenopausal bleeding.   Not currently having bleeding.   GYNECOLOGIC HISTORY: Patient's last menstrual period was 07/28/1996 (approximate). Contraception:   PMP Menopausal hormone therapy:  none Last mammogram:  04/22/18 diagnostic right BIRADS2:Benign. F/u screening 1 year  Last pap smear:   01/30/17 Neg. HR HPV:neg        OB History    Gravida  0   Para      Term      Preterm      AB      Living        SAB      TAB      Ectopic      Multiple      Live Births                 Patient Active Problem List   Diagnosis Date Noted  . Postmenopausal bleeding 10/24/2018  . Thickened endometrium 10/24/2018  . Tear of biceps tendon 09/21/2018  . Moderate persistent asthma, uncomplicated 80/32/1224  . Mild persistent asthma, uncomplicated 82/50/0370  . Other seasonal allergic rhinitis 02/12/2017  . Malignant neoplasm of overlapping sites of left breast in female, estrogen receptor positive ([place]) 10/27/2016  . Sinusitis 09/01/2016  . Hyponatremia 09/01/2016  . Malignant neoplasm of upper-outer quadrant of right breast in female, estrogen receptor positive ([place]) 04/19/2016  . Acid reflux 11/20/2015  . Bunion of great toe of right foot 04/04/2015  . Hemorrhoid 11/23/2014  . Anal skin tag 11/23/2014  . Anal burning 11/23/2014  . Plantar fasciitis, right 05/11/2014  . Retrocalcaneal bursitis 05/11/2014  . Pain in joint, ankle and foot 05/11/2014  . Heart burn   .  Joint pain   . Abnormal Pap smear   . Osteopenia     Past Medical History:  Diagnosis Date  . Allergy-induced asthma    prn inhaler  . Asthma    allergy induced  . Basal cell carcinoma    right clavicle  . Bruises easily   . Constipation   . Dental crowns present   . Endometrial thickening on ultrasound   . Family history of adverse reaction to anesthesia    pt's father has hx. of being hard to wake up post-op  . GERD (gastroesophageal reflux disease)   . Hemorrhoids   . History of breast cancer 2017   bilateral  . History of radiation therapy    08/19/16- 09/29/16, Right Breast 50 Gy in 25 fractions, Right Breast boost 10 Gy in 5 fractions, Left Breast 50.4 Gy in 28 fractions  . Hot flashes   . Immature cataract   . Migraines   . Osteoarthritis    hands and feet  . Osteopenia 02/2016   T score -1.3 FRAX 8.3%/0.7% stable from prior DEXA  . Overactive bladder   . PMB (postmenopausal bleeding)   . Pneumonia    History of  . Seasonal allergies   . Thyroid nodule 2012   Solitary nodule in the lower pole of the  left thyroid lobe , pt unaware  . Tingling of upper extremity   . Wears glasses     Past Surgical History:  Procedure Laterality Date  . BREAST RECONSTRUCTION WITH PLACEMENT OF TISSUE EXPANDER AND FLEX HD (ACELLULAR HYDRATED DERMIS) Left 07/01/2016   Procedure: BREAST RECONSTRUCTION WITH PLACEMENT OF TISSUE EXPANDER AND  ALLODERM;  Surgeon: Irene Limbo, MD;  Location: [place];  Service: Plastics;  Laterality: Left;  . COLONOSCOPY  2011  . INCISION AND DRAINAGE Left 06/02/2016   Procedure: DRAINAGE of left axilla seroma;  Surgeon: Rolm Bookbinder, MD;  Location: [place];  Service: General;  Laterality: Left;  . LIPOSUCTION WITH LIPOFILLING Left 06/11/2018   Procedure: Lipofilling from abdomen to left chest;  Surgeon: Irene Limbo, MD;  Location: [place];  Service: Plastics;  Laterality: Left;  Marland Kitchen  MASTOPEXY Right 04/17/2017   Procedure: RIGHT MASTOPEXY;  Surgeon: Irene Limbo, MD;  Location: [place];  Service: Plastics;  Laterality: Right;  . NIPPLE SPARING MASTECTOMY Left 07/01/2016   Procedure: LEFT NIPPLE SPARING MASTECTOMY;  Surgeon: Rolm Bookbinder, MD;  Location: [place];  Service: General;  Laterality: Left;  . RADIOACTIVE SEED GUIDED PARTIAL MASTECTOMY WITH AXILLARY SENTINEL LYMPH NODE BIOPSY Right 03/26/2016   Procedure: RIGHT BREAST LUMPECTOMY WITH RADIOACTIVE SEED AND SENTINEL LYMPH NODE BIOPSY;  Surgeon: Rolm Bookbinder, MD;  Location: [place];  Service: General;  Laterality: Right;  . RADIOACTIVE SEED GUIDED PARTIAL MASTECTOMY WITH AXILLARY SENTINEL LYMPH NODE BIOPSY Left 05/20/2016   Procedure: LEFT BREAST RADIOACTIVE SEED (two seeds) GUIDED LUMPECTOMY WITH LEFT AXILLARY SENTINEL LYMPH NODE BIOPSY AND LEFT SEED TARGETED AXILLARY LYMPH NODE EXCISION;  Surgeon: Rolm Bookbinder, MD;  Location: [place];  Service: General;  Laterality: Left;  . RE-EXCISION OF BREAST CANCER,SUPERIOR MARGINS Right 05/20/2016   Procedure: RE-EXCISION OF RIGHT BREAST MARGIN;  Surgeon: Rolm Bookbinder, MD;  Location: [place];  Service: General;  Laterality: Right;  . RE-EXCISION OF BREAST CANCER,SUPERIOR MARGINS Left 06/02/2016   Procedure: RE-EXCISION OF BREAST CANCER,SUPERIOR MARGINS;  Surgeon: Rolm Bookbinder, MD;  Location: [place];  Service: General;  Laterality: Left;  . REMOVAL OF TISSUE EXPANDER AND PLACEMENT OF IMPLANT Left 04/17/2017   Procedure: REMOVAL OF LEFT TISSUE EXPANDER WITH PLACEMENT OF LEFT SILICONE BREAST IMPLANT;  Surgeon: Irene Limbo, MD;  Location: [place];  Service: Plastics;  Laterality: Left;    Current Outpatient Medications  Medication Sig Dispense Refill  . acetaminophen (TYLENOL) 500 MG tablet Take 500 mg by mouth every 6 (six) hours  as needed for headache.    . albuterol (PROAIR HFA) 108 (90 Base) MCG/ACT inhaler Inhale 2 puffs into the lungs every 4 (four) hours as needed for wheezing or shortness of breath. 1 Inhaler 1  . anastrozole (ARIMIDEX) 1 MG tablet Take 1 tablet (1 mg total) by mouth daily. (Patient taking differently: Take 1 mg by mouth every morning. ) 90 tablet 4  . Azelastine-Fluticasone (DYMISTA) 137-50 MCG/ACT SUSP 2 (two) times a day.     Marland Kitchen CALCIUM-MAGNESIUM PO Take by mouth.    . cetirizine (ZYRTEC) 10 MG tablet Take 10 mg by mouth every evening.     . Cholecalciferol (VITAMIN D) 2000 units tablet Take 4,000 Units by mouth daily.     . diclofenac sodium (VOLTAREN) 1 % GEL Apply 2 g topically 3 (three) times daily. 100 g 0  . docusate sodium (COLACE) 50 MG capsule  Take 100 mg by mouth every evening.     Marland Kitchen FLOVENT HFA 110 MCG/ACT inhaler INHALE 1 PUFF BY MOUTH INTO THE LUNGS TWICE DAILY 12 g 0  . hydrocortisone (ANUSOL-HC) 2.5 % rectal cream Place 1 application rectally as needed for hemorrhoids or itching.    Marland Kitchen ibuprofen (ADVIL) 400 MG tablet Take 400 mg by mouth every 6 (six) hours as needed for mild pain.    Marland Kitchen OVER THE COUNTER MEDICATION Take by mouth as needed. FLORA RESTORE  When using antiobiotics    . OVER THE COUNTER MEDICATION Take by mouth daily. [place]    . PRILOSEC OTC 20 MG tablet every morning. Reported on 12/14/2015    . psyllium (METAMUCIL) 58.6 % packet Take 1 packet by mouth daily.    Marland Kitchen venlafaxine XR (EFFEXOR-XR) 75 MG 24 hr capsule TAKE 1 CAPSULE(75 MG) BY MOUTH DAILY WITH BREAKFAST 90 capsule 0   No current facility-administered medications for this visit.      ALLERGIES: Hydrocodone; Hydrocodone-acetaminophen; Penicillins; Adhesive [tape]; and Augmentin [amoxicillin-pot clavulanate]  Family History  Problem Relation Age of Onset  . Hypertension Mother   . Heart disease Mother   . Lung disease Mother   . Hypertension Father   . Cancer Father        colon  . Heart  disease Father   . Lung cancer Father   . Anesthesia problems Father        hard to wake up post-op  . Breast cancer Paternal Grandmother        Age 71's  . Hypertension Brother   . Hyperlipidemia Brother   . Diabetes Maternal Grandfather     Social History   Socioeconomic History  . Marital status: Single    Spouse name: Not on file  . Number of children: Not on file  . Years of education: Not on file  . Highest education level: Not on file  Occupational History  . Not on file  Social Needs  . Financial resource strain: Not on file  . Food insecurity:    Worry: Not on file    Inability: Not on file  . Transportation needs:    Medical: Not on file    Non-medical: Not on file  Tobacco Use  . Smoking status: Former Smoker    Packs/day: 1.00    Years: 13.00    Pack years: 13.00    Types: Cigarettes    Last attempt to quit: 07/27/1990    Years since quitting: 28.3  . Smokeless tobacco: Never Used  Substance and Sexual Activity  . Alcohol use: Yes    Comment: 2 beers daily  . Drug use: Not Currently    Types: Marijuana  . Sexual activity: Not Currently    Partners: Male    Birth control/protection: Post-menopausal    Comment: 1st intercourse 77 yo-5 partners  Lifestyle  . Physical activity:    Days per week: Not on file    Minutes per session: Not on file  . Stress: Not on file  Relationships  . Social connections:    Talks on phone: Not on file    Gets together: Not on file    Attends religious service: Not on file    Active member of club or organization: Not on file    Attends meetings of clubs or organizations: Not on file    Relationship status: Not on file  . Intimate partner violence:    Fear of current or ex partner: Not on  file    Emotionally abused: Not on file    Physically abused: Not on file    Forced sexual activity: Not on file  Other Topics Concern  . Not on file  Social History Narrative  . Not on file    Review of Systems   Genitourinary:       Loss of urine with sneeze or cough   All other systems reviewed and are negative.   PHYSICAL EXAMINATION:    BP 138/84   Pulse 76   Temp 98.4 F (36.9 C) (Oral)   Ht 5\' 4"  (1.626 m)   Wt 164 lb 3.2 oz (74.5 kg)   LMP 07/28/1996 (Approximate) Comment: pmb bleeding  BMI 28.18 kg/m     General appearance: alert, cooperative and appears stated age Head: Normocephalic, without obvious abnormality, atraumatic Neck: no adenopathy, supple, symmetrical, trachea midline and thyroid normal to inspection and palpation Lungs: clear to auscultation bilaterally Heart: regular rate and rhythm Abdomen: soft, non-tender, no masses,  no organomegaly Extremities: extremities normal, atraumatic, no cyanosis or edema Skin: Skin color, texture, turgor normal. No rashes or lesions No abnormal inguinal nodes palpated Neurologic: Grossly normal  Pelvic: External genitalia:  no lesions              Urethra:  normal appearing urethra with no masses, tenderness or lesions              Bartholins and Skenes: normal                 Vagina: normal appearing vagina with normal color and discharge, no lesions              Cervix: no lesions                Bimanual Exam:  Uterus:  normal size, contour, position, consistency, mobility, non-tender              Adnexa: no mass, fullness, tenderness         Chaperone was present for exam.  ASSESSMENT  Postmenopausal bleeding.  Endometrial mass.  Hx Tamoxifen use.  Now on Arimidex.  Hx asthma.   PLAN  Discussion of postmenopausal bleeding and suspected endometrial mass. Discussion of hysteroscopy with Myosure polypectomy, dilation and curettage.  Risks, benefits, and alternatives reviewed. Risks include but are not limited to bleeding, infection, damage to surrounding organs including uterine perforation requiring hospitalization and laparoscopy, pulmonary edema, reaction to anesthesia, DVT, PE, death, and need for further  treatment. The possibility of negative findings also reviewed. Surgical expectations and recovery discussed.  Patient wishes to proceed.   An After Visit Summary was printed and given to the patient.

## 2018-12-02 NOTE — Progress Notes (Signed)
Spoke with patient while in office. Pre-op instructions and post-op appts reviewed, copy provided to patient. Patient verbalizes understanding and is agreeable.

## 2018-12-03 LAB — NOVEL CORONAVIRUS, NAA (HOSP ORDER, SEND-OUT TO REF LAB; TAT 18-24 HRS): SARS-CoV-2, NAA: NOT DETECTED

## 2018-12-05 NOTE — Anesthesia Preprocedure Evaluation (Addendum)
Anesthesia Evaluation  Patient identified by MRN, date of birth, ID band Patient awake    Reviewed: Allergy & Precautions, NPO status , Patient's Chart, lab work & pertinent test results  Airway Mallampati: II  TM Distance: >3 FB Neck ROM: Full    Dental no notable dental hx. (+) Teeth Intact, Dental Advisory Given   Pulmonary asthma , former smoker,    Pulmonary exam normal breath sounds clear to auscultation       Cardiovascular Exercise Tolerance: Good negative cardio ROS Normal cardiovascular exam Rhythm:Regular Rate:Normal     Neuro/Psych  Headaches, negative psych ROS   GI/Hepatic   Endo/Other  negative endocrine ROS  Renal/GU K+ 4.0 Cr 0.77     Musculoskeletal   Abdominal   Peds  Hematology Hgb 13.8   Anesthesia Other Findings Breast CA  Reproductive/Obstetrics                            Anesthesia Physical Anesthesia Plan  ASA: III  Anesthesia Plan: General   Post-op Pain Management:    Induction: Intravenous  PONV Risk Score and Plan: 0 and Treatment may vary due to age or medical condition, Ondansetron and Dexamethasone  Airway Management Planned: LMA  Additional Equipment:   Intra-op Plan:   Post-operative Plan:   Informed Consent: I have reviewed the patients History and Physical, chart, labs and discussed the procedure including the risks, benefits and alternatives for the proposed anesthesia with the patient or authorized representative who has indicated his/her understanding and acceptance.     Dental advisory given  Plan Discussed with: CRNA  Anesthesia Plan Comments: (GA w LMA)       Anesthesia Quick Evaluation

## 2018-12-05 NOTE — H&P (Signed)
Office Visit   12/02/2018 Sabrina Tapia, Sabrina Tapia All, MD  Obstetrics and Gynecology   Post-menopausal bleeding +1 more  Dx   Pre-op Exam   ; Referred by Josetta Huddle, MD  Reason for Visit   Additional Documentation   Vitals:    BP 138/84   Pulse 76   Temp 98.4 F (36.9 C) (Oral)   Ht 5\' 4"  (1.626 m)   Wt 74.5 kg   LMP 07/28/1996 (Approximate)    BMI 28.18 kg/m   BSA 1.83 m     More Vitals   Flowsheets:    Anthropometrics,   MEWS Score,   Method of Visit     Encounter Info:    Billing Info,   History,   Allergies,   Detailed Report     All Notes    Progress Notes by Sabrina Cobbs, MD at 12/02/2018 10:00 AM  Author: [name], MD Author Type: Physician Filed: 12/05/2018  7:54 PM  Note Status: Addendum Cosign: Cosign Not Required Encounter Date: 12/02/2018  Editor: Sabrina Cobbs, MD (Physician)  Prior Versions: 1. Sabrina Cobbs, MD (Physician) at 12/05/2018  7:41 PM - Signed   2. Sabrina Tapia, CMA (Physiological scientist) at 12/02/2018 10:07 AM - Sign when Signing Visit    GYNECOLOGY  VISIT   HPI: 68 y.o.   Single  Caucasian  female   G0P0 with Patient's last menstrual period was 07/28/1996 (approximate).   here for pre op for 12/06/18 Dilatation and Curettage/Hysteroscopy with Myosure.   She has postmenopausal bleeding and has had benign endometrial sampling twice which does not reflect the endometrial thickening and mass seen on pelvic US.  Has multiple small fibroids and normal ovaries.    She was on Tamoxifen until recently.  She is now taking Arimidex following her postmenopausal bleeding.    Not currently having bleeding.    GYNECOLOGIC HISTORY: Patient's last menstrual period was 07/28/1996 (approximate). Contraception:   PMP Menopausal hormone therapy:  none Last mammogram:  04/22/18 diagnostic right BIRADS2:Benign. F/u screening 1 year  Last pap  smear:   01/30/17 Neg. HR HPV:neg                OB History     Gravida  0   Para      Term      Preterm      AB      Living         SAB      TAB      Ectopic      Multiple      Live Births                        Patient Active Problem List    Diagnosis Date Noted  . Postmenopausal bleeding 10/24/2018  . Thickened endometrium 10/24/2018  . Tear of biceps tendon 09/21/2018  . Moderate persistent asthma, uncomplicated 41/66/0630  . Mild persistent asthma, uncomplicated 16/07/930  . Other seasonal allergic rhinitis 02/12/2017  . Malignant neoplasm of overlapping sites of left breast in female, estrogen receptor positive ([place]) 10/27/2016  . Sinusitis 09/01/2016  . Hyponatremia 09/01/2016  . Malignant neoplasm of upper-outer quadrant of right breast in female, estrogen receptor positive ([place]) 04/19/2016  . Acid reflux 11/20/2015  . Bunion of great toe of right foot 04/04/2015  . Hemorrhoid 11/23/2014  .  Anal skin tag 11/23/2014  . Anal burning 11/23/2014  . Plantar fasciitis, right 05/11/2014  . Retrocalcaneal bursitis 05/11/2014  . Pain in joint, ankle and foot 05/11/2014  . Heart burn    . Joint pain    . Abnormal Pap smear    . Osteopenia            Past Medical History:  Diagnosis Date  . Allergy-induced asthma      prn inhaler  . Asthma      allergy induced  . Basal cell carcinoma      right clavicle  . Bruises easily    . Constipation    . Dental crowns present    . Endometrial thickening on ultrasound    . Family history of adverse reaction to anesthesia      pt's father has hx. of being hard to wake up post-op  . GERD (gastroesophageal reflux disease)    . Hemorrhoids    . History of breast cancer 2017    bilateral  . History of radiation therapy      08/19/16- 09/29/16, Right Breast 50 Gy in 25 fractions, Right Breast boost 10 Gy in 5 fractions, Left Breast 50.4 Gy in 28 fractions  . Hot flashes    . Immature cataract    . Migraines     . Osteoarthritis      hands and feet  . Osteopenia 02/2016    T score -1.3 FRAX 8.3%/0.7% stable from prior DEXA  . Overactive bladder    . PMB (postmenopausal bleeding)    . Pneumonia      History of  . Seasonal allergies    . Thyroid nodule 2012    Solitary nodule in the lower pole of the left thyroid lobe , pt unaware  . Tingling of upper extremity    . Wears glasses             Past Surgical History:  Procedure Laterality Date  . BREAST RECONSTRUCTION WITH PLACEMENT OF TISSUE EXPANDER AND FLEX HD (ACELLULAR HYDRATED DERMIS) Left 07/01/2016    Procedure: BREAST RECONSTRUCTION WITH PLACEMENT OF TISSUE EXPANDER AND  ALLODERM;  Surgeon: Irene Limbo, MD;  Location: [facility];  Service: Plastics;  Laterality: Left;  . COLONOSCOPY   2011  . INCISION AND DRAINAGE Left 06/02/2016    Procedure: DRAINAGE of left axilla seroma;  Surgeon: Rolm Bookbinder, MD;  Location: [facility];  Service: General;  Laterality: Left;  . LIPOSUCTION WITH LIPOFILLING Left 06/11/2018    Procedure: Lipofilling from abdomen to left chest;  Surgeon: Irene Limbo, MD;  Location: [facility];  Service: Plastics;  Laterality: Left;  Marland Kitchen MASTOPEXY Right 04/17/2017    Procedure: RIGHT MASTOPEXY;  Surgeon: Irene Limbo, MD;  Location: [facility];  Service: Plastics;  Laterality: Right;  . NIPPLE SPARING MASTECTOMY Left 07/01/2016    Procedure: LEFT NIPPLE SPARING MASTECTOMY;  Surgeon: Rolm Bookbinder, MD;  Location: [facility];  Service: General;  Laterality: Left;  . RADIOACTIVE SEED GUIDED PARTIAL MASTECTOMY WITH AXILLARY SENTINEL LYMPH NODE BIOPSY Right 03/26/2016    Procedure: RIGHT BREAST LUMPECTOMY WITH RADIOACTIVE SEED AND SENTINEL LYMPH NODE BIOPSY;  Surgeon: Rolm Bookbinder, MD;  Location: [facility];  Service: General;  Laterality: Right;  . RADIOACTIVE SEED GUIDED PARTIAL MASTECTOMY WITH AXILLARY SENTINEL  LYMPH NODE BIOPSY Left 05/20/2016    Procedure: LEFT BREAST RADIOACTIVE SEED (two seeds) GUIDED LUMPECTOMY WITH LEFT AXILLARY SENTINEL LYMPH NODE  BIOPSY AND LEFT SEED TARGETED AXILLARY LYMPH NODE EXCISION;  Surgeon: Rolm Bookbinder, MD;  Location: [facility];  Service: General;  Laterality: Left;  . RE-EXCISION OF BREAST CANCER,SUPERIOR MARGINS Right 05/20/2016    Procedure: RE-EXCISION OF RIGHT BREAST MARGIN;  Surgeon: Rolm Bookbinder, MD;  Location: [facility];  Service: General;  Laterality: Right;  . RE-EXCISION OF BREAST CANCER,SUPERIOR MARGINS Left 06/02/2016    Procedure: RE-EXCISION OF BREAST CANCER,SUPERIOR MARGINS;  Surgeon: Rolm Bookbinder, MD;  Location: [facility];  Service: General;  Laterality: Left;  . REMOVAL OF TISSUE EXPANDER AND PLACEMENT OF IMPLANT Left 04/17/2017    Procedure: REMOVAL OF LEFT TISSUE EXPANDER WITH PLACEMENT OF LEFT SILICONE BREAST IMPLANT;  Surgeon: Irene Limbo, MD;  Location: [facility];  Service: Plastics;  Laterality: Left;            Current Outpatient Medications  Medication Sig Dispense Refill  . acetaminophen (TYLENOL) 500 MG tablet Take 500 mg by mouth every 6 (six) hours as needed for headache.      . albuterol (PROAIR HFA) 108 (90 Base) MCG/ACT inhaler Inhale 2 puffs into the lungs every 4 (four) hours as needed for wheezing or shortness of breath. 1 Inhaler 1  . anastrozole (ARIMIDEX) 1 MG tablet Take 1 tablet (1 mg total) by mouth daily. (Patient taking differently: Take 1 mg by mouth every morning. ) 90 tablet 4  . Azelastine-Fluticasone (DYMISTA) 137-50 MCG/ACT SUSP 2 (two) times a day.       Marland Kitchen CALCIUM-MAGNESIUM PO Take by mouth.      . cetirizine (ZYRTEC) 10 MG tablet Take 10 mg by mouth every evening.       . Cholecalciferol (VITAMIN D) 2000 units tablet Take 4,000 Units by mouth daily.       . diclofenac sodium (VOLTAREN) 1 % GEL Apply 2 g topically 3 (three) times daily.  100 g 0  . docusate sodium (COLACE) 50 MG capsule Take 100 mg by mouth every evening.       Marland Kitchen FLOVENT HFA 110 MCG/ACT inhaler INHALE 1 PUFF BY MOUTH INTO THE LUNGS TWICE DAILY 12 g 0  . hydrocortisone (ANUSOL-HC) 2.5 % rectal cream Place 1 application rectally as needed for hemorrhoids or itching.      Marland Kitchen ibuprofen (ADVIL) 400 MG tablet Take 400 mg by mouth every 6 (six) hours as needed for mild pain.      Marland Kitchen OVER THE COUNTER MEDICATION Take by mouth as needed. FLORA RESTORE  When using antiobiotics      . OVER THE COUNTER MEDICATION Take by mouth daily. [place]      . PRILOSEC OTC 20 MG tablet every morning. Reported on 12/14/2015      . psyllium (METAMUCIL) 58.6 % packet Take 1 packet by mouth daily.      Marland Kitchen venlafaxine XR (EFFEXOR-XR) 75 MG 24 hr capsule TAKE 1 CAPSULE(75 MG) BY MOUTH DAILY WITH BREAKFAST 90 capsule 0    No current facility-administered medications for this visit.       ALLERGIES: Hydrocodone; Hydrocodone-acetaminophen; Penicillins; Adhesive [tape]; and Augmentin [amoxicillin-pot clavulanate]        Family History  Problem Relation Age of Onset  . Hypertension Mother    . Heart disease Mother    . Lung disease Mother    . Hypertension Father    . Cancer Father          colon  . Heart disease Father    . Lung  cancer Father    . Anesthesia problems Father          hard to wake up post-op  . Breast cancer Paternal Grandmother          Age 59's  . Hypertension Brother    . Hyperlipidemia Brother    . Diabetes Maternal Grandfather        Social History    Socioeconomic History  . Marital status: Single      Spouse name: Not on file  . Number of children: Not on file  . Years of education: Not on file  . Highest education level: Not on file  Occupational History  . Not on file  Social Needs  . Financial resource strain: Not on file  . Food insecurity:      Worry: Not on file      Inability: Not on file  . Transportation needs:      Medical: Not  on file      Non-medical: Not on file  Tobacco Use  . Smoking status: Former Smoker      Packs/day: 1.00      Years: 13.00      Pack years: 13.00      Types: Cigarettes      Last attempt to quit: 07/27/1990      Years since quitting: 28.3  . Smokeless tobacco: Never Used  Substance and Sexual Activity  . Alcohol use: Yes      Comment: 2 beers daily  . Drug use: Not Currently      Types: Marijuana  . Sexual activity: Not Currently      Partners: Male      Birth control/protection: Post-menopausal      Comment: 1st intercourse 74 yo-5 partners  Lifestyle  . Physical activity:      Days per week: Not on file      Minutes per session: Not on file  . Stress: Not on file  Relationships  . Social connections:      Talks on phone: Not on file      Gets together: Not on file      Attends religious service: Not on file      Active member of club or organization: Not on file      Attends meetings of clubs or organizations: Not on file      Relationship status: Not on file  . Intimate partner violence:      Fear of current or ex partner: Not on file      Emotionally abused: Not on file      Physically abused: Not on file      Forced sexual activity: Not on file  Other Topics Concern  . Not on file  Social History Narrative  . Not on file      Review of Systems  Genitourinary:       Loss of urine with sneeze or cough   All other systems reviewed and are negative.     PHYSICAL EXAMINATION:     BP 138/84   Pulse 76   Temp 98.4 F (36.9 C) (Oral)   Ht 5\' 4"  (1.626 m)   Wt 164 lb 3.2 oz (74.5 kg)   LMP 07/28/1996 (Approximate) Comment: pmb bleeding  BMI 28.18 kg/m     General appearance: alert, cooperative and appears stated age Head: Normocephalic, without obvious abnormality, atraumatic Neck: no adenopathy, supple, symmetrical, trachea midline and thyroid normal to inspection and palpation Lungs: clear to auscultation bilaterally  Heart: regular rate and  rhythm Abdomen: soft, non-tender, no masses,  no organomegaly Extremities: extremities normal, atraumatic, no cyanosis or edema Skin: Skin color, texture, turgor normal. No rashes or lesions No abnormal inguinal nodes palpated Neurologic: Grossly normal   Pelvic: External genitalia:  no lesions              Urethra:  normal appearing urethra with no masses, tenderness or lesions              Bartholins and Skenes: normal                 Vagina: normal appearing vagina with normal color and discharge, no lesions              Cervix: no lesions                Bimanual Exam:  Uterus:  normal size, contour, position, consistency, mobility, non-tender              Adnexa: no mass, fullness, tenderness          Chaperone was present for exam.   ASSESSMENT   Postmenopausal bleeding.  Endometrial mass.  Hx Tamoxifen use.  Now on Arimidex.  Hx asthma.    PLAN   Discussion of postmenopausal bleeding and suspected endometrial mass. Discussion of hysteroscopy with Myosure polypectomy, dilation and curettage.  Risks, benefits, and alternatives reviewed. Risks include but are not limited to bleeding, infection, damage to surrounding organs including uterine perforation requiring hospitalization and laparoscopy, pulmonary edema, reaction to anesthesia, DVT, PE, death, and need for further treatment. The possibility of negative findings also reviewed. Surgical expectations and recovery discussed.  Patient wishes to proceed.   An After Visit Summary was printed and given to the patient.           Progress Notes by Burnice Logan, RN at 12/02/2018 10:00 AM  Author: Burnice Logan, RN Author Type: Registered Nurse Filed: 12/05/2018  7:41 PM  Note Status: Signed Cosign: Cosign Not Required Encounter Date: 12/02/2018  Editor: Burnice Logan, RN (Registered Nurse)    Spoke with patient while in office. Pre-op instructions and post-op appts reviewed, copy provided to patient. Patient verbalizes  understanding and is agreeable.

## 2018-12-06 ENCOUNTER — Other Ambulatory Visit: Payer: Self-pay

## 2018-12-06 ENCOUNTER — Ambulatory Visit (HOSPITAL_BASED_OUTPATIENT_CLINIC_OR_DEPARTMENT_OTHER): Payer: Medicare Other | Admitting: Anesthesiology

## 2018-12-06 ENCOUNTER — Ambulatory Visit (HOSPITAL_BASED_OUTPATIENT_CLINIC_OR_DEPARTMENT_OTHER)
Admission: RE | Admit: 2018-12-06 | Discharge: 2018-12-06 | Disposition: A | Discharge status: Home / Self Care | Payer: Medicare Other | Attending: Obstetrics and Gynecology | Admitting: Obstetrics and Gynecology

## 2018-12-06 ENCOUNTER — Encounter (HOSPITAL_BASED_OUTPATIENT_CLINIC_OR_DEPARTMENT_OTHER)
Admission: RE | Disposition: A | Discharge status: Home / Self Care | Payer: Self-pay | Source: Home / Self Care | Attending: Obstetrics and Gynecology

## 2018-12-06 ENCOUNTER — Encounter (HOSPITAL_BASED_OUTPATIENT_CLINIC_OR_DEPARTMENT_OTHER): Payer: Self-pay | Admitting: Emergency Medicine

## 2018-12-06 ENCOUNTER — Ambulatory Visit (HOSPITAL_BASED_OUTPATIENT_CLINIC_OR_DEPARTMENT_OTHER): Payer: Medicare Other | Admitting: Physician Assistant

## 2018-12-06 DIAGNOSIS — Z79811 Long term (current) use of aromatase inhibitors: Secondary | ICD-10-CM | POA: Insufficient documentation

## 2018-12-06 DIAGNOSIS — Z87891 Personal history of nicotine dependence: Secondary | ICD-10-CM | POA: Insufficient documentation

## 2018-12-06 DIAGNOSIS — N841 Polyp of cervix uteri: Secondary | ICD-10-CM | POA: Diagnosis not present

## 2018-12-06 DIAGNOSIS — Z9012 Acquired absence of left breast and nipple: Secondary | ICD-10-CM | POA: Diagnosis not present

## 2018-12-06 DIAGNOSIS — D259 Leiomyoma of uterus, unspecified: Secondary | ICD-10-CM | POA: Insufficient documentation

## 2018-12-06 DIAGNOSIS — Z79899 Other long term (current) drug therapy: Secondary | ICD-10-CM | POA: Diagnosis not present

## 2018-12-06 DIAGNOSIS — Z853 Personal history of malignant neoplasm of breast: Secondary | ICD-10-CM | POA: Diagnosis not present

## 2018-12-06 DIAGNOSIS — N950 Postmenopausal bleeding: Secondary | ICD-10-CM | POA: Diagnosis present

## 2018-12-06 DIAGNOSIS — N852 Hypertrophy of uterus: Secondary | ICD-10-CM | POA: Diagnosis not present

## 2018-12-06 DIAGNOSIS — Z923 Personal history of irradiation: Secondary | ICD-10-CM | POA: Insufficient documentation

## 2018-12-06 DIAGNOSIS — R9389 Abnormal findings on diagnostic imaging of other specified body structures: Secondary | ICD-10-CM | POA: Diagnosis not present

## 2018-12-06 DIAGNOSIS — Z85828 Personal history of other malignant neoplasm of skin: Secondary | ICD-10-CM | POA: Insufficient documentation

## 2018-12-06 DIAGNOSIS — K219 Gastro-esophageal reflux disease without esophagitis: Secondary | ICD-10-CM | POA: Diagnosis not present

## 2018-12-06 DIAGNOSIS — J4540 Moderate persistent asthma, uncomplicated: Secondary | ICD-10-CM | POA: Insufficient documentation

## 2018-12-06 DIAGNOSIS — Z7951 Long term (current) use of inhaled steroids: Secondary | ICD-10-CM | POA: Insufficient documentation

## 2018-12-06 DIAGNOSIS — Z79810 Long term (current) use of selective estrogen receptor modulators (SERMs): Secondary | ICD-10-CM | POA: Diagnosis not present

## 2018-12-06 HISTORY — PX: DILATATION & CURETTAGE/HYSTEROSCOPY WITH MYOSURE: SHX6511

## 2018-12-06 HISTORY — DX: Constipation, unspecified: K59.00

## 2018-12-06 HISTORY — DX: Presence of spectacles and contact lenses: Z97.3

## 2018-12-06 HISTORY — DX: Pneumonia, unspecified organism: J18.9

## 2018-12-06 HISTORY — DX: Nontoxic single thyroid nodule: E04.1

## 2018-12-06 HISTORY — DX: Postmenopausal bleeding: N95.0

## 2018-12-06 HISTORY — DX: Paresthesia of skin: R20.2

## 2018-12-06 HISTORY — DX: Other seasonal allergic rhinitis: J30.2

## 2018-12-06 HISTORY — DX: Migraine, unspecified, not intractable, without status migrainosus: G43.909

## 2018-12-06 HISTORY — DX: Abnormal findings on diagnostic imaging of other specified body structures: R93.89

## 2018-12-06 HISTORY — DX: Flushing: R23.2

## 2018-12-06 HISTORY — DX: Basal cell carcinoma of skin, unspecified: C44.91

## 2018-12-06 SURGERY — DILATATION & CURETTAGE/HYSTEROSCOPY WITH MYOSURE
Anesthesia: General

## 2018-12-06 MED ORDER — FENTANYL CITRATE (PF) 100 MCG/2ML IJ SOLN
INTRAMUSCULAR | Status: AC
  Filled 2018-12-06: qty 2

## 2018-12-06 MED ORDER — KETOROLAC TROMETHAMINE 30 MG/ML IJ SOLN
INTRAMUSCULAR | Status: AC
  Filled 2018-12-06: qty 1

## 2018-12-06 MED ORDER — DEXAMETHASONE SODIUM PHOSPHATE 10 MG/ML IJ SOLN
INTRAMUSCULAR | Status: DC | PRN
  Administered 2018-12-06: 4 mg via INTRAVENOUS

## 2018-12-06 MED ORDER — LIDOCAINE HCL 1 % IJ SOLN
INTRAMUSCULAR | Status: DC | PRN
  Administered 2018-12-06: 10 mL

## 2018-12-06 MED ORDER — ACETAMINOPHEN 500 MG PO TABS
1000.0000 mg | ORAL_TABLET | Freq: Once | ORAL | Status: AC
  Administered 2018-12-06: 1000 mg via ORAL
  Filled 2018-12-06: qty 2

## 2018-12-06 MED ORDER — KETOROLAC TROMETHAMINE 30 MG/ML IJ SOLN
30.0000 mg | Freq: Once | INTRAMUSCULAR | Status: DC | PRN
  Filled 2018-12-06: qty 1

## 2018-12-06 MED ORDER — LIDOCAINE 2% (20 MG/ML) 5 ML SYRINGE
INTRAMUSCULAR | Status: DC | PRN
  Administered 2018-12-06: 100 mg via INTRAVENOUS

## 2018-12-06 MED ORDER — GABAPENTIN 100 MG PO CAPS
100.0000 mg | ORAL_CAPSULE | Freq: Once | ORAL | Status: DC
  Filled 2018-12-06: qty 1

## 2018-12-06 MED ORDER — PROPOFOL 10 MG/ML IV BOLUS
INTRAVENOUS | Status: DC | PRN
  Administered 2018-12-06: 180 mg via INTRAVENOUS
  Administered 2018-12-06: 20 mg via INTRAVENOUS

## 2018-12-06 MED ORDER — LIDOCAINE 2% (20 MG/ML) 5 ML SYRINGE
INTRAMUSCULAR | Status: AC
  Filled 2018-12-06: qty 5

## 2018-12-06 MED ORDER — KETOROLAC TROMETHAMINE 30 MG/ML IJ SOLN
INTRAMUSCULAR | Status: DC | PRN
  Administered 2018-12-06: 15 mg via INTRAVENOUS

## 2018-12-06 MED ORDER — GABAPENTIN 300 MG PO CAPS
ORAL_CAPSULE | ORAL | Status: AC
  Filled 2018-12-06: qty 1

## 2018-12-06 MED ORDER — LACTATED RINGERS IV SOLN
INTRAVENOUS | Status: DC
  Administered 2018-12-06: 07:00:00 via INTRAVENOUS
  Filled 2018-12-06: qty 1000

## 2018-12-06 MED ORDER — ONDANSETRON HCL 4 MG/2ML IJ SOLN
INTRAMUSCULAR | Status: AC
  Filled 2018-12-06: qty 2

## 2018-12-06 MED ORDER — GABAPENTIN 300 MG PO CAPS
300.0000 mg | ORAL_CAPSULE | Freq: Once | ORAL | Status: AC
  Administered 2018-12-06: 07:00:00 300 mg via ORAL
  Filled 2018-12-06: qty 1

## 2018-12-06 MED ORDER — DEXAMETHASONE SODIUM PHOSPHATE 10 MG/ML IJ SOLN
INTRAMUSCULAR | Status: AC
  Filled 2018-12-06: qty 1

## 2018-12-06 MED ORDER — ACETAMINOPHEN 500 MG PO TABS
ORAL_TABLET | ORAL | Status: AC
  Filled 2018-12-06: qty 2

## 2018-12-06 MED ORDER — EPHEDRINE 5 MG/ML INJ
INTRAVENOUS | Status: AC
  Filled 2018-12-06: qty 10

## 2018-12-06 MED ORDER — EPHEDRINE SULFATE-NACL 50-0.9 MG/10ML-% IV SOSY
PREFILLED_SYRINGE | INTRAVENOUS | Status: DC | PRN
  Administered 2018-12-06 (×2): 10 mg via INTRAVENOUS

## 2018-12-06 MED ORDER — MIDAZOLAM HCL 2 MG/2ML IJ SOLN
INTRAMUSCULAR | Status: AC
  Filled 2018-12-06: qty 2

## 2018-12-06 MED ORDER — FENTANYL CITRATE (PF) 100 MCG/2ML IJ SOLN
INTRAMUSCULAR | Status: DC | PRN
  Administered 2018-12-06: 100 ug via INTRAVENOUS

## 2018-12-06 MED ORDER — ONDANSETRON HCL 4 MG/2ML IJ SOLN
4.0000 mg | Freq: Once | INTRAMUSCULAR | Status: DC | PRN
  Filled 2018-12-06: qty 2

## 2018-12-06 MED ORDER — FENTANYL CITRATE (PF) 100 MCG/2ML IJ SOLN
25.0000 ug | INTRAMUSCULAR | Status: DC | PRN
  Administered 2018-12-06: 10:00:00 25 ug via INTRAVENOUS
  Filled 2018-12-06: qty 1

## 2018-12-06 MED ORDER — ONDANSETRON HCL 4 MG/2ML IJ SOLN
INTRAMUSCULAR | Status: DC | PRN
  Administered 2018-12-06: 4 mg via INTRAVENOUS

## 2018-12-06 MED ORDER — PROPOFOL 10 MG/ML IV BOLUS
INTRAVENOUS | Status: AC
  Filled 2018-12-06: qty 20

## 2018-12-06 SURGICAL SUPPLY — 19 items
CANISTER SUCT 3000ML PPV (MISCELLANEOUS) ×2 IMPLANT
CATH ROBINSON RED A/P 16FR (CATHETERS) ×2 IMPLANT
COVER WAND RF STERILE (DRAPES) ×2 IMPLANT
DEVICE MYOSURE LITE (MISCELLANEOUS) ×1 IMPLANT
DEVICE MYOSURE REACH (MISCELLANEOUS) IMPLANT
DILATOR CANAL MILEX (MISCELLANEOUS) IMPLANT
GAUZE 4X4 16PLY RFD (DISPOSABLE) ×2 IMPLANT
GLOVE BIO SURGEON STRL SZ 6.5 (GLOVE) ×2 IMPLANT
GOWN STRL REUS W/TWL LRG LVL3 (GOWN DISPOSABLE) ×2 IMPLANT
IV NS IRRIG 3000ML ARTHROMATIC (IV SOLUTION) ×2 IMPLANT
KIT PROCEDURE FLUENT (KITS) ×2 IMPLANT
MYOSURE XL FIBROID (MISCELLANEOUS)
NDL SPNL 22GX3.5 QUINCKE BK (NEEDLE) IMPLANT
NEEDLE SPNL 22GX3.5 QUINCKE BK (NEEDLE) ×2 IMPLANT
PACK VAGINAL MINOR WOMEN LF (CUSTOM PROCEDURE TRAY) ×2 IMPLANT
PAD OB MATERNITY 4.3X12.25 (PERSONAL CARE ITEMS) ×2 IMPLANT
SEAL ROD LENS SCOPE MYOSURE (ABLATOR) ×2 IMPLANT
SYSTEM TISS REMOVAL MYOSURE XL (MISCELLANEOUS) IMPLANT
TOWEL OR 17X26 10 PK STRL BLUE (TOWEL DISPOSABLE) ×4 IMPLANT

## 2018-12-06 NOTE — Anesthesia Postprocedure Evaluation (Signed)
Anesthesia Post Note  Patient: Sabrina Tapia  Procedure(s) Performed: DILATATION & CURETTAGE/HYSTEROSCOPY WITH MYOSURE (N/A )     Patient location during evaluation: PACU Anesthesia Type: General Level of consciousness: awake and alert Pain management: pain level controlled Vital Signs Assessment: post-procedure vital signs reviewed and stable Respiratory status: spontaneous breathing, nonlabored ventilation, respiratory function stable and patient connected to nasal cannula oxygen Cardiovascular status: blood pressure returned to baseline and stable Postop Assessment: no apparent nausea or vomiting Anesthetic complications: no    Last Vitals:  Vitals:   12/06/18 1000 12/06/18 1033  BP: 125/80 130/87  Pulse: 81 87  Resp: 13 18  Temp:  36.5 C  SpO2: 100% 99%    Last Pain:  Vitals:   12/06/18 1033  TempSrc:   PainSc: 0-No pain                 Barnet Glasgow

## 2018-12-06 NOTE — Progress Notes (Signed)
Update to History and Physical  No marked change in status since office preop visit.  No vaginal bleeding today. Patient examined.  OK to proceed with surgery.

## 2018-12-06 NOTE — Discharge Instructions (Signed)
Post Anesthesia Home Care Instructions  Activity: Get plenty of rest for the remainder of the day. A responsible individual must stay with you for 24 hours following the procedure.  For the next 24 hours, DO NOT: -Drive a car -Paediatric nurse -Drink alcoholic beverages -Take any medication unless instructed by your physician -Make any legal decisions or sign important papers.  Meals: Start with liquid foods such as gelatin or soup. Progress to regular foods as tolerated. Avoid greasy, spicy, heavy foods. If nausea and/or vomiting occur, drink only clear liquids until the nausea and/or vomiting subsides. Call your physician if vomiting continues.  Special Instructions/Symptoms: Your throat may feel dry or sore from the anesthesia or the breathing tube placed in your throat during surgery. If this causes discomfort, gargle with warm salt water. The discomfort should disappear within 24 hours.  If you had a scopolamine patch placed behind your ear for the management of post- operative nausea and/or vomiting:  1. The medication in the patch is effective for 72 hours, after which it should be removed.  Wrap patch in a tissue and discard in the trash. Wash hands thoroughly with soap and water. 2. You may remove the patch earlier than 72 hours if you experience unpleasant side effects which may include dry mouth, dizziness or visual disturbances. 3. Avoid touching the patch. Wash your hands with soap and water after contact with the patch.    Dilation and Curettage or Vacuum Curettage, Care After These instructions give you information about caring for yourself after your procedure. Your doctor may also give you more specific instructions. Call your doctor if you have any problems or questions after your procedure. Follow these instructions at home: Activity  Do not drive or use heavy machinery while taking prescription pain medicine.  For 24 hours after your procedure, avoid  driving.  Take short walks often, followed by rest periods. Ask your doctor what activities are safe for you. After one or two days, you may be able to return to your normal activities.  Do not lift anything that is heavier than 10 lb (4.5 kg) until your doctor approves.  For at least 2 weeks, or as long as told by your doctor: ? Do not douche. ? Do not use tampons. ? Do not have sex. General instructions   Take over-the-counter and prescription medicines only as told by your doctor. This is very important if you take blood thinning medicine.  Do not take baths, swim, or use a hot tub until your doctor approves. Take showers instead of baths.  Wear compression stockings as told by your doctor.  It is up to you to get the results of your procedure. Ask your doctor when your results will be ready.  Keep all follow-up visits as told by your doctor. This is important. Contact a doctor if:  You have very bad cramps that get worse or do not get better with medicine.  You have very bad pain in your belly (abdomen).  You cannot drink fluids without throwing up (vomiting).  You get pain in a different part of the area between your belly and thighs (pelvis).  You have bad-smelling discharge from your vagina.  You have a rash. Get help right away if:  You are bleeding a lot from your vagina. A lot of bleeding means soaking more than one sanitary pad in an hour, for 2 hours in a row.  You have clumps of blood (blood clots) coming from your vagina.  You  have a fever or chills.  Your belly feels very tender or hard.  You have chest pain.  You have trouble breathing.  You cough up blood.  You feel dizzy.  You feel light-headed.  You pass out (faint).  You have pain in your neck or shoulder area. Summary  Take short walks often, followed by rest periods. Ask your doctor what activities are safe for you. After one or two days, you may be able to return to your normal  activities.  Do not lift anything that is heavier than 10 lb (4.5 kg) until your doctor approves.  Do not take baths, swim, or use a hot tub until your doctor approves. Take showers instead of baths.  Contact your doctor if you have any symptoms of infection, like bad-smelling discharge from your vagina. This information is not intended to replace advice given to you by your health care provider. Make sure you discuss any questions you have with your health care provider. Document Released: 04/22/2008 Document Revised: 03/31/2016 Document Reviewed: 03/31/2016 Elsevier Interactive Patient Education  2019 [employer]. Hysteroscopy, Care After This sheet gives you information about how to care for yourself after your procedure. Your health care provider may also give you more specific instructions. If you have problems or questions, contact your health care provider. What can I expect after the procedure? After the procedure, it is common to have:  Cramping.  Bleeding. This can vary from light spotting to menstrual-like bleeding. Follow these instructions at home: Activity  Rest for 1-2 days after the procedure.  Do not douche, use tampons, or have sex for 2 weeks after the procedure, or until your health care provider approves.  Do not drive for 24 hours after the procedure, or for as long as told by your health care provider.  Do not drive, use heavy machinery, or drink alcohol while taking prescription pain medicines. Medicines   Take over-the-counter and prescription medicines only as told by your health care provider.  Do not take aspirin during recovery. It can increase the risk of bleeding. General instructions  Do not take baths, swim, or use a hot tub until your health care provider approves. Take showers instead of baths for 2 weeks, or for as long as told by your health care provider.  To prevent or treat constipation while you are taking prescription pain medicine,  your health care provider may recommend that you: ? Drink enough fluid to keep your urine clear or pale yellow. ? Take over-the-counter or prescription medicines. ? Eat foods that are high in fiber, such as fresh fruits and vegetables, whole grains, and beans. ? Limit foods that are high in fat and processed sugars, such as fried and sweet foods.  Keep all follow-up visits as told by your health care provider. This is important. Contact a health care provider if:  You feel dizzy or lightheaded.  You feel nauseous.  You have abnormal vaginal discharge.  You have a rash.  You have pain that does not get better with medicine.  You have chills. Get help right away if:  You have bleeding that is heavier than a normal menstrual period.  You have a fever.  You have pain or cramps that get worse.  You develop new abdominal pain.  You faint.  You have pain in your shoulders.  You have shortness of breath. Summary  After the procedure, you may have cramping and some vaginal bleeding.  Do not douche, use tampons, or have sex  for 2 weeks after the procedure, or until your health care provider approves.  Do not take baths, swim, or use a hot tub until your health care provider approves. Take showers instead of baths for 2 weeks, or for as long as told by your health care provider.  Report any unusual symptoms to your health care provider.  Keep all follow-up visits as told by your health care provider. This is important. This information is not intended to replace advice given to you by your health care provider. Make sure you discuss any questions you have with your health care provider. Document Released: 05/04/2013 Document Revised: 08/12/2016 Document Reviewed: 08/12/2016 Elsevier Interactive Patient Education  2019 [employer].

## 2018-12-06 NOTE — Op Note (Signed)
OPERATIVE REPORT   PREOPERATIVE DIAGNOSES:   Postmenopausal bleeding, endometrial thickening with mass, Tamoxifen use.  POSTOPERATIVE DIAGNOSES:   Postmenopausal bleeding, endometrial thickening, cervical polyp,Tamoxifen use.  PROCEDURE:  Hysteroscopy with Myosure resection of endocervical polyp and endometrial sampling, dilation and curettage.  SURGEON:  Lenard Galloway, MD  ANESTHESIA:  LMA, paracervical block with 10 mL of 1% lidocaine.  IV FLUIDS:   850 cc.  EBL:  5 cc.  URINE OUTPUT:  150 cc  NORMAL SALINE DEFICIT:  030 cc  COMPLICATIONS:  None.  INDICATIONS FOR THE PROCEDURE:     The patient is a 68 year old G64 Caucasian female who presents with postmenopausal bleeding while on Tamoxifen.  Pelvic ultrasound revealed thickened endometrium and what appeared to be a mass with increased blood flow.  Sonohysterogram confirmed this finding.  Endometrial sampling was performed with the original ultrasound and with the sonohysterogram evaluation, and the tissue showed atrophy and did not reflect Tamoxifen use.    A plan is now made to proceed with a hysteroscopy with dilation and curettage and Myosure resection of endometrial mass; after risks, benefits and alternatives were reviewed.  FINDINGS:  Exam under anesthesia revealed a small uterus with no adnexal masses The uterus was sounded to 7 cm. Hysteroscopy showed an endocervical polyp.  Endometrial currettings were scanty.   SPECIMENS:  1.  Endocervical polyp and endometrial sampling was sent to pathology together. 2.  Endometrial curettings were sent to pathology separately.   PROCEDURE IN DETAIL:  The patient was reidentified in the preoperative hold area.  She received TED hose and PAS stockings for DVT prophylaxis.  In the operating room, the patient was placed in the dorsal lithotomy position and then an LMA anesthetic was introduced.  The patient's lower abdomen, vagina and perineum were sterilely prepped with Betadine  and the  patient's bladder was catheterized of urine.  She was sterilly draped  An exam under anesthesia was performed.  A speculum was placed inside the vagina and a single-tooth tenaculum was placed on the anterior cervical lip.  A paracervical block was performed with a total of 10 mL of 1% lidocaine plain.  The uterus was sounded. The cervix was dilated to a #21 Pratt dilator.  The MyoSure hysteroscope was then inserted inside the uterine cavity under the continuous infusion of normal saline solution.  Findings are as noted above.  The MyoSure hysteroscope was used to remove the endocervical polyp and then sample multiple areas  of the endometrium under direct view.  This was all sent to pathology together.  The cervix  was further dilated to a #23 Pratt dilator.  The sharp curette was introduced  into the uterine cavity and the endometrium was curetted in all 4 quadrants.  A minimal amount of endometrial curettings was obtained.  This specimen was sent to  pathology separately.   The single-tooth tenaculum which had been placed on the anterior cervical lip was  removed.  Hemostasis was good, and all of the vaginal instruments were removed.  The patient was awakened and escorted to the recovery room in stable condition after she was cleansed with Betadine.  There were no complications to the procedure.  All needle, instrument and sponge counts were correct.  Lenard Galloway, MD

## 2018-12-06 NOTE — Transfer of Care (Signed)
Immediate Anesthesia Transfer of Care Note  Patient: Sabrina Tapia  Procedure(s) Performed: DILATATION & CURETTAGE/HYSTEROSCOPY WITH MYOSURE (N/A )  Patient Location: PACU  Anesthesia Type:General  Level of Consciousness: awake, alert  and oriented  Airway & Oxygen Therapy: Patient Spontanous Breathing and Patient connected to nasal cannula oxygen  Post-op Assessment: Report given to RN  Post vital signs: Reviewed and stable  Last Vitals:  Vitals Value Taken Time  BP 125/79 12/06/2018  9:26 AM  Temp    Pulse 94 12/06/2018  9:27 AM  Resp 15 12/06/2018  9:27 AM  SpO2 100 % 12/06/2018  9:27 AM  Vitals shown include unvalidated device data.  Last Pain:  Vitals:   12/06/18 0627  TempSrc: Oral      Patients Stated Pain Goal: 4 (30/09/79 4997)  Complications: No apparent anesthesia complications

## 2018-12-06 NOTE — Anesthesia Procedure Notes (Signed)
Procedure Name: LMA Insertion Date/Time: 12/06/2018 8:38 AM Performed by: Bonney Aid, CRNA Pre-anesthesia Checklist: Patient identified, Emergency Drugs available, Suction available and Patient being monitored Patient Re-evaluated:Patient Re-evaluated prior to induction Oxygen Delivery Method: Circle system utilized Preoxygenation: Pre-oxygenation with 100% oxygen Induction Type: IV induction Ventilation: Mask ventilation without difficulty LMA: LMA inserted LMA Size: 4.0 Number of attempts: 1 Airway Equipment and Method: Bite block Placement Confirmation: positive ETCO2 Tube secured with: Tape Dental Injury: Teeth and Oropharynx as per pre-operative assessment

## 2018-12-07 ENCOUNTER — Encounter (HOSPITAL_BASED_OUTPATIENT_CLINIC_OR_DEPARTMENT_OTHER): Payer: Self-pay | Admitting: Obstetrics and Gynecology

## 2018-12-08 ENCOUNTER — Telehealth: Payer: Self-pay

## 2018-12-08 NOTE — Telephone Encounter (Signed)
Spoke with patient. Results given. Patient verbalizes understanding. Patient verbalizes understanding. Patient reports she is doing well. No questions or concerns at this time.  Routing to provider and will close encounter.

## 2018-12-08 NOTE — Telephone Encounter (Signed)
-----   Message from [name], MD sent at 12/08/2018  8:52 AM EDT ----- Please contact patient with results of surgical pathology confirming a benign polyp of the uterine cavity.  I suspect this is what the ultrasound had detected.  No malignancy is seen.  I hope she is doing well following surgery this week!

## 2018-12-17 ENCOUNTER — Other Ambulatory Visit: Payer: Self-pay

## 2018-12-22 ENCOUNTER — Encounter: Payer: Self-pay | Admitting: Obstetrics and Gynecology

## 2018-12-22 ENCOUNTER — Other Ambulatory Visit: Payer: Self-pay

## 2018-12-22 ENCOUNTER — Ambulatory Visit (INDEPENDENT_AMBULATORY_CARE_PROVIDER_SITE_OTHER): Payer: Medicare Other | Admitting: Obstetrics and Gynecology

## 2018-12-22 VITALS — BP 110/60 | HR 64 | Temp 97.8°F | Ht 64.0 in | Wt 165.0 lb

## 2018-12-22 DIAGNOSIS — Z9889 Other specified postprocedural states: Secondary | ICD-10-CM

## 2018-12-22 NOTE — Progress Notes (Signed)
GYNECOLOGY  VISIT   HPI: 68 y.o.   Single  Caucasian  female   G0P0 with Patient's last menstrual period was 07/28/1996 (approximate).   here for 2 week post op   DILATATION & CURETTAGE/HYSTEROSCOPY WITH MYOSURE (N/A ) Postmenopausal bleeding on Tamoxifen with endometrial thickening.   Final pathology - benign lower uterine segment polyp.   Bled for about 14 days after her surgery was done.   Did not feel well on Anastrozole.  Joint pain, hot flashes, fatigue.  She stopped it and restarted Tamoxifen.  Her oncologist is not aware yet.   States she really wants a hysterectomy someday because she is concerned about future risk of uterine cancer.   Gives a lot of importance to quality of life.  GYNECOLOGIC HISTORY: Patient's last menstrual period was 07/28/1996 (approximate). Contraception:  PMP Menopausal hormone therapy:  none Last mammogram:   04/22/18 diagnostic right BIRADS2:Benign. F/u screening 1 year  Last pap smear:   01/30/17 Neg:Neg HR HPV        OB History    Gravida  0   Para      Term      Preterm      AB      Living        SAB      TAB      Ectopic      Multiple      Live Births                 Patient Active Problem List   Diagnosis Date Noted  . Postmenopausal bleeding 10/24/2018  . Thickened endometrium 10/24/2018  . Tear of biceps tendon 09/21/2018  . Moderate persistent asthma, uncomplicated 41/32/4401  . Mild persistent asthma, uncomplicated 02/72/5366  . Other seasonal allergic rhinitis 02/12/2017  . Malignant neoplasm of overlapping sites of left breast in female, estrogen receptor positive ([place]) 10/27/2016  . Sinusitis 09/01/2016  . Hyponatremia 09/01/2016  . Malignant neoplasm of upper-outer quadrant of right breast in female, estrogen receptor positive ([place]) 04/19/2016  . Acid reflux 11/20/2015  . Bunion of great toe of right foot 04/04/2015  . Hemorrhoid 11/23/2014  . Anal skin tag 11/23/2014  . Anal burning 11/23/2014  .  Plantar fasciitis, right 05/11/2014  . Retrocalcaneal bursitis 05/11/2014  . Pain in joint, ankle and foot 05/11/2014  . Heart burn   . Joint pain   . Abnormal Pap smear   . Osteopenia     Past Medical History:  Diagnosis Date  . Allergy-induced asthma    prn inhaler  . Asthma    allergy induced  . Basal cell carcinoma    right clavicle  . Bruises easily   . Constipation   . Dental crowns present   . Endometrial thickening on ultrasound   . Family history of adverse reaction to anesthesia    pt's father has hx. of being hard to wake up post-op  . GERD (gastroesophageal reflux disease)   . Hemorrhoids   . History of breast cancer 2017   bilateral  . History of radiation therapy    08/19/16- 09/29/16, Right Breast 50 Gy in 25 fractions, Right Breast boost 10 Gy in 5 fractions, Left Breast 50.4 Gy in 28 fractions  . Hot flashes   . Immature cataract   . Migraines   . Osteoarthritis    hands and feet  . Osteopenia 02/2016   T score -1.3 FRAX 8.3%/0.7% stable from prior DEXA  . Overactive bladder   .  PMB (postmenopausal bleeding)   . Pneumonia    History of  . Seasonal allergies   . Thyroid nodule 2012   Solitary nodule in the lower pole of the left thyroid lobe , pt unaware  . Tingling of upper extremity   . Wears glasses     Past Surgical History:  Procedure Laterality Date  . BREAST RECONSTRUCTION WITH PLACEMENT OF TISSUE EXPANDER AND FLEX HD (ACELLULAR HYDRATED DERMIS) Left 07/01/2016   Procedure: BREAST RECONSTRUCTION WITH PLACEMENT OF TISSUE EXPANDER AND  ALLODERM;  Surgeon: Irene Limbo, MD;  Location: [place];  Service: Plastics;  Laterality: Left;  . COLONOSCOPY  2011  . DILATATION & CURETTAGE/HYSTEROSCOPY WITH MYOSURE N/A 12/06/2018   Procedure: DILATATION & CURETTAGE/HYSTEROSCOPY WITH MYOSURE;  Surgeon: [name], MD;  Location: [facility];  Service: Gynecology;  Laterality: N/A;  follow 1st case  . INCISION  AND DRAINAGE Left 06/02/2016   Procedure: DRAINAGE of left axilla seroma;  Surgeon: Rolm Bookbinder, MD;  Location: [place];  Service: General;  Laterality: Left;  . LIPOSUCTION WITH LIPOFILLING Left 06/11/2018   Procedure: Lipofilling from abdomen to left chest;  Surgeon: Irene Limbo, MD;  Location: [place];  Service: Plastics;  Laterality: Left;  Marland Kitchen MASTOPEXY Right 04/17/2017   Procedure: RIGHT MASTOPEXY;  Surgeon: Irene Limbo, MD;  Location: [place];  Service: Plastics;  Laterality: Right;  . NIPPLE SPARING MASTECTOMY Left 07/01/2016   Procedure: LEFT NIPPLE SPARING MASTECTOMY;  Surgeon: Rolm Bookbinder, MD;  Location: [place];  Service: General;  Laterality: Left;  . RADIOACTIVE SEED GUIDED PARTIAL MASTECTOMY WITH AXILLARY SENTINEL LYMPH NODE BIOPSY Right 03/26/2016   Procedure: RIGHT BREAST LUMPECTOMY WITH RADIOACTIVE SEED AND SENTINEL LYMPH NODE BIOPSY;  Surgeon: Rolm Bookbinder, MD;  Location: [place];  Service: General;  Laterality: Right;  . RADIOACTIVE SEED GUIDED PARTIAL MASTECTOMY WITH AXILLARY SENTINEL LYMPH NODE BIOPSY Left 05/20/2016   Procedure: LEFT BREAST RADIOACTIVE SEED (two seeds) GUIDED LUMPECTOMY WITH LEFT AXILLARY SENTINEL LYMPH NODE BIOPSY AND LEFT SEED TARGETED AXILLARY LYMPH NODE EXCISION;  Surgeon: Rolm Bookbinder, MD;  Location: [place];  Service: General;  Laterality: Left;  . RE-EXCISION OF BREAST CANCER,SUPERIOR MARGINS Right 05/20/2016   Procedure: RE-EXCISION OF RIGHT BREAST MARGIN;  Surgeon: Rolm Bookbinder, MD;  Location: [place];  Service: General;  Laterality: Right;  . RE-EXCISION OF BREAST CANCER,SUPERIOR MARGINS Left 06/02/2016   Procedure: RE-EXCISION OF BREAST CANCER,SUPERIOR MARGINS;  Surgeon: Rolm Bookbinder, MD;  Location: [place];  Service: General;  Laterality: Left;  . REMOVAL OF TISSUE  EXPANDER AND PLACEMENT OF IMPLANT Left 04/17/2017   Procedure: REMOVAL OF LEFT TISSUE EXPANDER WITH PLACEMENT OF LEFT SILICONE BREAST IMPLANT;  Surgeon: Irene Limbo, MD;  Location: [place];  Service: Plastics;  Laterality: Left;    Current Outpatient Medications  Medication Sig Dispense Refill  . acetaminophen (TYLENOL) 500 MG tablet Take 500 mg by mouth every 6 (six) hours as needed for headache.    . albuterol (PROAIR HFA) 108 (90 Base) MCG/ACT inhaler Inhale 2 puffs into the lungs every 4 (four) hours as needed for wheezing or shortness of breath. 1 Inhaler 1  . ANUCORT-HC 25 MG suppository     . Azelastine-Fluticasone (DYMISTA) 137-50 MCG/ACT SUSP 2 (two) times a day.     Marland Kitchen CALCIUM-MAGNESIUM PO Take by mouth.    . cetirizine (ZYRTEC) 10 MG tablet Take 10  mg by mouth every evening.     . Cholecalciferol (VITAMIN D) 2000 units tablet Take 4,000 Units by mouth daily.     . diclofenac sodium (VOLTAREN) 1 % GEL Apply 2 g topically 3 (three) times daily. 100 g 0  . docusate sodium (COLACE) 50 MG capsule Take 100 mg by mouth every evening.     Marland Kitchen FLOVENT HFA 110 MCG/ACT inhaler INHALE 1 PUFF BY MOUTH INTO THE LUNGS TWICE DAILY 12 g 0  . hydrocortisone (ANUSOL-HC) 2.5 % rectal cream Place 1 application rectally as needed for hemorrhoids or itching.    Marland Kitchen ibuprofen (ADVIL) 400 MG tablet Take 400 mg by mouth every 6 (six) hours as needed for mild pain.    Marland Kitchen omeprazole (PRILOSEC) 20 MG capsule     . OVER THE COUNTER MEDICATION Take by mouth as needed. FLORA RESTORE  When using antiobiotics    . OVER THE COUNTER MEDICATION Take by mouth daily. [place]    . PRILOSEC OTC 20 MG tablet every morning. Reported on 12/14/2015    . psyllium (METAMUCIL) 58.6 % packet Take 1 packet by mouth daily.    . tamoxifen (NOLVADEX) 10 MG tablet Take 10 mg by mouth daily.    . valACYclovir (VALTREX) 1000 MG tablet     . venlafaxine XR (EFFEXOR-XR) 75 MG 24 hr capsule TAKE 1 CAPSULE(75 MG)  BY MOUTH DAILY WITH BREAKFAST 90 capsule 0  . anastrozole (ARIMIDEX) 1 MG tablet Take 1 mg by mouth daily.     No current facility-administered medications for this visit.      ALLERGIES: Hydrocodone; Hydrocodone-acetaminophen; Penicillins; Adhesive [tape]; and Augmentin [amoxicillin-pot clavulanate]  Family History  Problem Relation Age of Onset  . Hypertension Mother   . Heart disease Mother   . Lung disease Mother   . Hypertension Father   . Cancer Father        colon  . Heart disease Father   . Lung cancer Father   . Anesthesia problems Father        hard to wake up post-op  . Breast cancer Paternal Grandmother        Age 20's  . Hypertension Brother   . Hyperlipidemia Brother   . Diabetes Maternal Grandfather     Social History   Socioeconomic History  . Marital status: Single    Spouse name: Not on file  . Number of children: Not on file  . Years of education: Not on file  . Highest education level: Not on file  Occupational History  . Not on file  Social Needs  . Financial resource strain: Not on file  . Food insecurity:    Worry: Not on file    Inability: Not on file  . Transportation needs:    Medical: Not on file    Non-medical: Not on file  Tobacco Use  . Smoking status: Former Smoker    Packs/day: 1.00    Years: 13.00    Pack years: 13.00    Types: Cigarettes    Last attempt to quit: 07/27/1990    Years since quitting: 28.4  . Smokeless tobacco: Never Used  Substance and Sexual Activity  . Alcohol use: Yes    Comment: 2 beers daily  . Drug use: Not Currently    Types: Marijuana  . Sexual activity: Not Currently    Partners: Male    Birth control/protection: Post-menopausal    Comment: 1st intercourse 49 yo-5 partners  Lifestyle  . Physical activity:  Days per week: Not on file    Minutes per session: Not on file  . Stress: Not on file  Relationships  . Social connections:    Talks on phone: Not on file    Gets together: Not on file     Attends religious service: Not on file    Active member of club or organization: Not on file    Attends meetings of clubs or organizations: Not on file    Relationship status: Not on file  . Intimate partner violence:    Fear of current or ex partner: Not on file    Emotionally abused: Not on file    Physically abused: Not on file    Forced sexual activity: Not on file  Other Topics Concern  . Not on file  Social History Narrative  . Not on file    Review of Systems  Constitutional: Negative.   HENT: Negative.   Eyes: Negative.   Respiratory: Negative.   Cardiovascular: Negative.   Gastrointestinal: Negative.   Endocrine: Negative.   Genitourinary: Negative.   Musculoskeletal: Negative.   Skin: Negative.   Allergic/Immunologic: Negative.   Neurological: Negative.   Hematological: Negative.   Psychiatric/Behavioral: Negative.     PHYSICAL EXAMINATION:    BP 110/60 (BP Location: Right Arm, Patient Position: Sitting, Cuff Size: Normal)   Pulse 64   Temp 97.8 F (36.6 C) (Temporal)   Ht 5\' 4"  (1.626 m)   Wt 165 lb (74.8 kg)   LMP 07/28/1996 (Approximate) Comment: pmb bleeding  BMI 28.32 kg/m     General appearance: alert, cooperative and appears stated age   Pelvic: External genitalia:  no lesions              Urethra:  normal appearing urethra with no masses, tenderness or lesions              Bartholins and Skenes: normal                 Vagina: normal appearing vagina with normal color and discharge, no lesions              Cervix: no lesions                Bimanual Exam:  Uterus:  normal size, contour, position, consistency, mobility, non-tender              Adnexa: no mass, fullness, tenderness              Chaperone was present for exam.  ASSESSMENT  Status post endometrial polypectomy, dilation and curettage. Postmenopausal bleeding on Tamoxifen.  Intolerance to Anastrozole.  Patient resumption of Tamoxifen.  PLAN  We discussed her surgical  findings, pathology report and procedure.  We reviewed well differentiated endometrial cancers related to Tamoxifen use.  She may consider hysterectomy for the future.  She will follow up with Dr. Jana Hakim about her Tamoxifen use.  FU for annual exam in July, 2020.   An After Visit Summary was printed and given to the patient.

## 2019-01-21 ENCOUNTER — Encounter: Payer: Self-pay | Admitting: Gynecology

## 2019-01-25 ENCOUNTER — Other Ambulatory Visit: Payer: Self-pay

## 2019-01-25 ENCOUNTER — Inpatient Hospital Stay: Payer: Medicare Other | Attending: Oncology | Admitting: Oncology

## 2019-01-25 ENCOUNTER — Inpatient Hospital Stay: Payer: Medicare Other

## 2019-01-25 VITALS — BP 157/78 | HR 70 | Temp 98.9°F | Resp 18 | Ht 64.0 in | Wt 163.0 lb

## 2019-01-25 DIAGNOSIS — K5900 Constipation, unspecified: Secondary | ICD-10-CM | POA: Insufficient documentation

## 2019-01-25 DIAGNOSIS — M8580 Other specified disorders of bone density and structure, unspecified site: Secondary | ICD-10-CM | POA: Insufficient documentation

## 2019-01-25 DIAGNOSIS — Z87891 Personal history of nicotine dependence: Secondary | ICD-10-CM | POA: Diagnosis not present

## 2019-01-25 DIAGNOSIS — K219 Gastro-esophageal reflux disease without esophagitis: Secondary | ICD-10-CM | POA: Diagnosis not present

## 2019-01-25 DIAGNOSIS — Z803 Family history of malignant neoplasm of breast: Secondary | ICD-10-CM | POA: Insufficient documentation

## 2019-01-25 DIAGNOSIS — Z170 Estrogen receptor positive status [ER+]: Secondary | ICD-10-CM | POA: Diagnosis not present

## 2019-01-25 DIAGNOSIS — C50812 Malignant neoplasm of overlapping sites of left female breast: Secondary | ICD-10-CM | POA: Insufficient documentation

## 2019-01-25 DIAGNOSIS — Z853 Personal history of malignant neoplasm of breast: Secondary | ICD-10-CM | POA: Diagnosis not present

## 2019-01-25 DIAGNOSIS — Z923 Personal history of irradiation: Secondary | ICD-10-CM | POA: Diagnosis not present

## 2019-01-25 DIAGNOSIS — Z85828 Personal history of other malignant neoplasm of skin: Secondary | ICD-10-CM | POA: Insufficient documentation

## 2019-01-25 DIAGNOSIS — Z800 Family history of malignant neoplasm of digestive organs: Secondary | ICD-10-CM | POA: Diagnosis not present

## 2019-01-25 DIAGNOSIS — Z79899 Other long term (current) drug therapy: Secondary | ICD-10-CM | POA: Insufficient documentation

## 2019-01-25 DIAGNOSIS — C50411 Malignant neoplasm of upper-outer quadrant of right female breast: Secondary | ICD-10-CM

## 2019-01-25 DIAGNOSIS — J45909 Unspecified asthma, uncomplicated: Secondary | ICD-10-CM | POA: Diagnosis not present

## 2019-01-25 DIAGNOSIS — M1990 Unspecified osteoarthritis, unspecified site: Secondary | ICD-10-CM | POA: Diagnosis not present

## 2019-01-25 DIAGNOSIS — R232 Flushing: Secondary | ICD-10-CM | POA: Insufficient documentation

## 2019-01-25 DIAGNOSIS — Z79810 Long term (current) use of selective estrogen receptor modulators (SERMs): Secondary | ICD-10-CM | POA: Insufficient documentation

## 2019-01-25 LAB — CBC WITH DIFFERENTIAL/PLATELET
Abs Immature Granulocytes: 0.01 10*3/uL (ref 0.00–0.07)
Basophils Absolute: 0 10*3/uL (ref 0.0–0.1)
Basophils Relative: 1 %
Eosinophils Absolute: 0.3 10*3/uL (ref 0.0–0.5)
Eosinophils Relative: 6 %
HCT: 42.9 % (ref 36.0–46.0)
Hemoglobin: 14.2 g/dL (ref 12.0–15.0)
Immature Granulocytes: 0 %
Lymphocytes Relative: 24 %
Lymphs Abs: 1 10*3/uL (ref 0.7–4.0)
MCH: 31.5 pg (ref 26.0–34.0)
MCHC: 33.1 g/dL (ref 30.0–36.0)
MCV: 95.1 fL (ref 80.0–100.0)
Monocytes Absolute: 0.5 10*3/uL (ref 0.1–1.0)
Monocytes Relative: 12 %
Neutro Abs: 2.4 10*3/uL (ref 1.7–7.7)
Neutrophils Relative %: 57 %
Platelets: 300 10*3/uL (ref 150–400)
RBC: 4.51 MIL/uL (ref 3.87–5.11)
RDW: 13.2 % (ref 11.5–15.5)
WBC: 4.1 10*3/uL (ref 4.0–10.5)
nRBC: 0 % (ref 0.0–0.2)

## 2019-01-25 LAB — COMPREHENSIVE METABOLIC PANEL
ALT: 18 U/L (ref 0–44)
AST: 22 U/L (ref 15–41)
Albumin: 3.7 g/dL (ref 3.5–5.0)
Alkaline Phosphatase: 70 U/L (ref 38–126)
Anion gap: 9 (ref 5–15)
BUN: 12 mg/dL (ref 8–23)
CO2: 23 mmol/L (ref 22–32)
Calcium: 9.3 mg/dL (ref 8.9–10.3)
Chloride: 106 mmol/L (ref 98–111)
Creatinine, Ser: 0.77 mg/dL (ref 0.44–1.00)
GFR calc Af Amer: >60 mL/min (ref >60)
GFR calc non Af Amer: >60 mL/min (ref >60)
Glucose, Bld: 77 mg/dL (ref 70–99)
Potassium: 4.3 mmol/L (ref 3.5–5.1)
Sodium: 138 mmol/L (ref 135–145)
Total Bilirubin: 0.3 mg/dL (ref 0.3–1.2)
Total Protein: 6.5 g/dL (ref 6.5–8.1)

## 2019-01-25 NOTE — Progress Notes (Signed)
Sabrina Tapia  [phone] [phone]    ID: Sabrina Tapia DOB: 1951/06/25  MR#: [id number]  FUX#:[id number]  Patient Care Team: Sabrina Huddle, MD as PCP - General (Internal Medicine) Sabrina Bookbinder, MD as Consulting Physician (General Surgery) Sabrina Tapia, Sabrina Dad, MD as Consulting Physician (Oncology) Sabrina Tapia, Sabrina Block, MD as Consulting Physician (Gynecology) Sabrina Tapia, Sabrina Massed, NP as Nurse Practitioner (Hematology and Oncology) Sabrina Tapia, Sabrina All, MD as Consulting Physician (Obstetrics and Gynecology) OTHER MD:   CHIEF COMPLAINT: Estrogen receptor positive breast cancer  CURRENT TREATMENT: Tamoxifen   INTERVAL HISTORY: Sabrina Tapia is seen today for follow-up and treatment of her estrogen receptor positive breast cancer.   She was placed on anastrozole at her last visit on 11/18/2018. She reports she stopped taking after about 6 weeks it due to intense hot flashes and feeling "like I had the flu Tapia the time." She also reports pain in her feet and hands. She stopped taking it towards the end of May 2020 and is getting "back to baseline"  Since her last visit, she underwent dilatation and curettage with hysteroscopy on 12/06/2018 for her endometrial bleeding under Dr. Quincy Tapia. Pathology from the procedure ([id number]) showed:  1. Endocervical polyp: benign lower uterine segment type poly, no malignancy identified 2. Endometrium, curettage: very scant soft tissue, no epithelium present.  She also underwent left axilla ultrasound on 01/18/2019 at [facility] for follow up of previously palpable areas. The results show resolution of two of the three areas of concern, with the third being benign fat necrosis.  Prior to her hysteroscopy, she underwent coronavirus testing on 12/02/2018. This was negative.   REVIEW OF SYSTEMS: Sabrina Tapia reports she does not sleep well. She notes she is stressed by Tapia the recent news and events. She states she is having a  hard time falling asleep, as well as restlessness and nocturia at least once. She notes this is Tapia new for her. She goes golfing for exercise. A detailed review of systems was otherwise entirely negative.   BREAST CANCER HISTORY: From the earlier summary note:  "Sabrina Tapia" had screening mammography at her gynecologist's office suggestive of a change in the upper-outer quadrant of the right breast. She was referred to [facility] where on 03/04/2016 she underwent bilateral diagnostic mammography and right breast ultrasonography. The breast density was category C. In the upper outer quadrant of the right breast there was a new area of architectural distortion. By ultrasonography this measured 0.5 cm. The right axilla was reported as sonographically benign.  On 03/06/2016 she underwent core needle biopsy of the right breast mass in question, and this showed ([id number]) an invasive ductal carcinoma, grade 1, estrogen receptor 95% positive, progesterone receptor 95% positive, both with strong staining intensity, with an MIB-1 of 12%, and no HER-2 amplification, the signals ratio being 1.54 and the number per cell 2.15.  Her case was presented in the multidisciplinary breast cancer conference 03/12/2016.Marland Kitchen At that time a preliminary plan was proposed: Breast conserving surgery with sentinel lymph node sampling, no Oncotype if the tumor was no larger than 5 mm, and consideration of genetics testing.  On 03/26/2016 Sabrina Tapia underwent right lumpectomy and sentinel lymph node sampling. This showed ([id number] [phone]) an invasive ductal carcinoma measuring 1.4 cm, grade 1, with negative margins, and a macrometastatic deposit of ductal carcinoma in the single sentinel lymph node. Mammaprint from this tumor was read as "low risk".  However there was a separate invasive lobular carcinoma measuring 0.6 cm,  focally involving the  final right medial margin. Because of the lobular breast cancer bilateral breast MRI was obtained  04/04/2016. This showed in the right breast a 7.4 x 4.5 cm seroma with 2 enhancing masses posterior to the lumpectomy cavity, the larger measuring 1.0 cm. These were felt possibly to be reactive lymph nodes. One of these lymph nodes was biopsied 04/09/2016 and this showed ([id number]) benign lymph node tissue. The MRI showed no evidence of additional disease in the right breast.  However in the left breast centrally there was a 1.3 cm enhancing mass. Biopsy of this left breast mass 04/09/2016 ([id number]) showed an invasive ductal carcinoma, grade 1 or 2, with insufficient tissue for a prognostic panel. On 04/11/2016 biopsy of a second left breast mass, upper outer quadrant, 4.6 cm a way from the other left breast mass, showed ([id number]) an invasive ductal carcinoma, grade 2, estrogen receptor 10% positive with strong staining intensity, progesterone receptor negative, with an MIB-1 of 2%, and no HER-2 amplification, the signals ratio being 1.50 and the number per cell 2.10. The proliferation fraction was 2%.  On 04/14/2016 Sabrina Tapia underwent biopsy of a suspicious left axillary lymph node and this ([id number]) was positive for invasive ductal carcinoma, estrogen and progesterone receptor negative, with an MIB-1 of 10%. I do not find that HER-2 was repeated. Mammaprint from this tumor also came back low risk.  Her subsequent history is as detailed below.   PAST MEDICAL HISTORY: Past Medical History:  Diagnosis Date  . Allergy-induced asthma    prn inhaler  . Asthma    allergy induced  . Basal cell carcinoma    right clavicle  . Bruises easily   . Constipation   . Dental crowns present   . Endometrial thickening on ultrasound   . Family history of adverse reaction to anesthesia    pt's father has hx. of being hard to wake up post-op  . GERD (gastroesophageal reflux disease)   . Hemorrhoids   . History of breast cancer 2017   bilateral  . History of radiation therapy    08/19/16-  09/29/16, Right Breast 50 Gy in 25 fractions, Right Breast boost 10 Gy in 5 fractions, Left Breast 50.4 Gy in 28 fractions  . Hot flashes   . Immature cataract   . Migraines   . Osteoarthritis    hands and feet  . Osteopenia 02/2016   T score -1.3 FRAX 8.3%/0.7% stable from prior DEXA  . Overactive bladder   . PMB (postmenopausal bleeding)   . Pneumonia    History of  . Seasonal allergies   . Thyroid nodule 2012   Solitary nodule in the lower pole of the left thyroid lobe , pt unaware  . Tingling of upper extremity   . Wears glasses     PAST SURGICAL HISTORY: Past Surgical History:  Procedure Laterality Date  . BREAST RECONSTRUCTION WITH PLACEMENT OF TISSUE EXPANDER AND FLEX HD (ACELLULAR HYDRATED DERMIS) Left 07/01/2016   Procedure: BREAST RECONSTRUCTION WITH PLACEMENT OF TISSUE EXPANDER AND  ALLODERM;  Surgeon: Irene Limbo, MD;  Location: [place];  Service: Plastics;  Laterality: Left;  . COLONOSCOPY  2011  . DILATATION & CURETTAGE/HYSTEROSCOPY WITH MYOSURE N/A 12/06/2018   Procedure: DILATATION & CURETTAGE/HYSTEROSCOPY WITH MYOSURE;  Surgeon: [name], MD;  Location: [facility];  Service: Gynecology;  Laterality: N/A;  follow 1st case  . INCISION AND DRAINAGE Left 06/02/2016   Procedure: DRAINAGE of left axilla seroma;  Surgeon: Sabrina Bookbinder, MD;  Location: [place];  Service: General;  Laterality: Left;  . LIPOSUCTION WITH LIPOFILLING Left 06/11/2018   Procedure: Lipofilling from abdomen to left chest;  Surgeon: Irene Limbo, MD;  Location: [place];  Service: Plastics;  Laterality: Left;  Marland Kitchen MASTOPEXY Right 04/17/2017   Procedure: RIGHT MASTOPEXY;  Surgeon: Irene Limbo, MD;  Location: [place];  Service: Plastics;  Laterality: Right;  . NIPPLE SPARING MASTECTOMY Left 07/01/2016   Procedure: LEFT NIPPLE SPARING MASTECTOMY;  Surgeon: Sabrina Bookbinder, MD;  Location:  [place];  Service: General;  Laterality: Left;  . RADIOACTIVE SEED GUIDED PARTIAL MASTECTOMY WITH AXILLARY SENTINEL LYMPH NODE BIOPSY Right 03/26/2016   Procedure: RIGHT BREAST LUMPECTOMY WITH RADIOACTIVE SEED AND SENTINEL LYMPH NODE BIOPSY;  Surgeon: Sabrina Bookbinder, MD;  Location: [place];  Service: General;  Laterality: Right;  . RADIOACTIVE SEED GUIDED PARTIAL MASTECTOMY WITH AXILLARY SENTINEL LYMPH NODE BIOPSY Left 05/20/2016   Procedure: LEFT BREAST RADIOACTIVE SEED (two seeds) GUIDED LUMPECTOMY WITH LEFT AXILLARY SENTINEL LYMPH NODE BIOPSY AND LEFT SEED TARGETED AXILLARY LYMPH NODE EXCISION;  Surgeon: Sabrina Bookbinder, MD;  Location: [place];  Service: General;  Laterality: Left;  . RE-EXCISION OF BREAST CANCER,SUPERIOR MARGINS Right 05/20/2016   Procedure: RE-EXCISION OF RIGHT BREAST MARGIN;  Surgeon: Sabrina Bookbinder, MD;  Location: [place];  Service: General;  Laterality: Right;  . RE-EXCISION OF BREAST CANCER,SUPERIOR MARGINS Left 06/02/2016   Procedure: RE-EXCISION OF BREAST CANCER,SUPERIOR MARGINS;  Surgeon: Sabrina Bookbinder, MD;  Location: [place];  Service: General;  Laterality: Left;  . REMOVAL OF TISSUE EXPANDER AND PLACEMENT OF IMPLANT Left 04/17/2017   Procedure: REMOVAL OF LEFT TISSUE EXPANDER WITH PLACEMENT OF LEFT SILICONE BREAST IMPLANT;  Surgeon: Irene Limbo, MD;  Location: [place];  Service: Plastics;  Laterality: Left;    FAMILY HISTORY Family History  Problem Relation Age of Onset  . Hypertension Mother   . Heart disease Mother   . Lung disease Mother   . Hypertension Father   . Cancer Father        colon  . Heart disease Father   . Lung cancer Father   . Anesthesia problems Father        hard to wake up post-op  . Breast cancer Paternal Grandmother        Age 74's  . Hypertension Brother   . Hyperlipidemia Brother   . Diabetes Maternal  Grandfather    The patient's father died from lung cancer at the age of 74. He also had a remote history of colon cancer. His mother had a history of breast cancer in her 45s. The patient's mother died at age 32. The patient has one brother, no sisters.  GYNECOLOGIC HISTORY:  Patient's last menstrual period was 07/28/1996 (approximate). Menarche age 55, the patient is GX P0. 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 She lives alone, with no pets.   ADVANCED DIRECTIVES: In place. She has named her brother Marya Amsler as her healthcare part of attorney. He can be reached at [phone]/29/[phone]   HEALTH MAINTENANCE: Social History   Tobacco Use  . Smoking status: Former Smoker    Packs/day: 1.00    Years: 13.00    Pack years: 13.00  Types: Cigarettes    Quit date: 07/27/1990    Years since quitting: 28.5  . Smokeless tobacco: Never Used  Substance Use Topics  . Alcohol use: Yes    Comment: 2 beers daily  . Drug use: Not Currently    Types: Marijuana     Colonoscopy: February 2016  PAP:  Bone density: 04/22/2018; -1.9 osteopenia   Allergies  Allergen Reactions  . Hydrocodone Other (See Comments)    Restlessness, "energy"  . Hydrocodone-Acetaminophen Other (See Comments)  . Penicillins Itching  . Adhesive [Tape] Other (See Comments)    SKIN IRRITATION  . Augmentin [Amoxicillin-Pot Clavulanate] Itching    Current Outpatient Medications  Medication Sig Dispense Refill  . acetaminophen (TYLENOL) 500 MG tablet Take 500 mg by mouth every 6 (six) hours as needed for headache.    . albuterol (PROAIR HFA) 108 (90 Base) MCG/ACT inhaler Inhale 2 puffs into the lungs every 4 (four) hours as needed for wheezing or shortness of breath. 1 Inhaler 1  . anastrozole (ARIMIDEX) 1 MG tablet Take 1 mg by mouth daily.     . ANUCORT-HC 25 MG suppository     . Azelastine-Fluticasone (DYMISTA) 137-50 MCG/ACT SUSP 2 (two) times a day.     Marland Kitchen CALCIUM-MAGNESIUM PO Take by mouth.    . cetirizine (ZYRTEC) 10 MG tablet Take 10 mg by mouth every evening.     . Cholecalciferol (VITAMIN D) 2000 units tablet Take 4,000 Units by mouth daily.     . diclofenac sodium (VOLTAREN) 1 % GEL Apply 2 g topically 3 (three) times daily. 100 g 0  . docusate sodium (COLACE) 50 MG capsule Take 100 mg by mouth every evening.     Marland Kitchen FLOVENT HFA 110 MCG/ACT inhaler INHALE 1 PUFF BY MOUTH INTO THE LUNGS TWICE DAILY 12 g 0  . hydrocortisone (ANUSOL-HC) 2.5 % rectal cream Place 1 application rectally as needed for hemorrhoids or itching.    Marland Kitchen ibuprofen (ADVIL) 400 MG tablet Take 400 mg by mouth every 6 (six) hours as needed for mild pain.    Marland Kitchen omeprazole (PRILOSEC) 20 MG capsule     . OVER THE COUNTER MEDICATION Take by mouth as needed. FLORA RESTORE  When using antiobiotics    . OVER THE COUNTER MEDICATION Take by mouth daily. [place]    . PRILOSEC OTC 20 MG tablet every morning. Reported on 12/14/2015    . psyllium (METAMUCIL) 58.6 % packet Take 1 packet by mouth daily.    . tamoxifen (NOLVADEX) 10 MG tablet Take 10 mg by mouth daily.    . valACYclovir (VALTREX) 1000 MG tablet     . venlafaxine XR (EFFEXOR-XR) 75 MG 24 hr capsule TAKE 1 CAPSULE(75 MG) BY MOUTH DAILY WITH BREAKFAST 90 capsule 0   No current facility-administered medications for this visit.     OBJECTIVE: Middle-aged white woman in no acute distress  Vitals:   01/25/19 0953  BP: (!) 157/78  Pulse: 70  Resp: 18  Temp: 98.9 F (37.2 C)  SpO2: 100%     Body mass index is 27.98 kg/m.    ECOG FS:1 - Symptomatic but completely ambulatory Sclerae unicteric, EOMs intact Wearing a mask No cervical or supraclavicular adenopathy Lungs no rales or rhonchi Heart regular rate and rhythm Abd soft, nontender, positive bowel sounds MSK no focal spinal tenderness, no  upper extremity lymphedema Neuro: nonfocal, well oriented, appropriate affect Breasts: The right breast is status post reduction mammoplasty as well as lumpectomy.  There is no  evidence of local recurrence.  The left breast is status post mastectomy with silicone implant reconstruction.  There is no evidence of local recurrence.  In the left axilla there is a palpable mass which is unchanged from prior.   LAB RESULTS:  CMP     Component Value Date/Time   NA 138 01/25/2019 0911   NA 136 03/12/2017 1115   K 4.3 01/25/2019 0911   K 4.0 03/12/2017 1115   CL 106 01/25/2019 0911   CO2 23 01/25/2019 0911   CO2 25 03/12/2017 1115   GLUCOSE 77 01/25/2019 0911   GLUCOSE 78 03/12/2017 1115   BUN 12 01/25/2019 0911   BUN 12.4 03/12/2017 1115   CREATININE 0.77 01/25/2019 0911   CREATININE 0.7 03/12/2017 1115   CALCIUM 9.3 01/25/2019 0911   CALCIUM 9.1 03/12/2017 1115   PROT 6.5 01/25/2019 0911   PROT 6.6 03/12/2017 1115   ALBUMIN 3.7 01/25/2019 0911   ALBUMIN 3.6 03/12/2017 1115   AST 22 01/25/2019 0911   AST 34 03/12/2017 1115   ALT 18 01/25/2019 0911   ALT 33 03/12/2017 1115   ALKPHOS 70 01/25/2019 0911   ALKPHOS 57 03/12/2017 1115   BILITOT 0.3 01/25/2019 0911   BILITOT 0.41 03/12/2017 1115   GFRNONAA >60 01/25/2019 0911   GFRAA >60 01/25/2019 0911    INo results found for: SPEP, UPEP  Lab Results  Component Value Date   WBC 4.1 01/25/2019   NEUTROABS 2.4 01/25/2019   HGB 14.2 01/25/2019   HCT 42.9 01/25/2019   MCV 95.1 01/25/2019   PLT 300 01/25/2019      Chemistry      Component Value Date/Time   NA 138 01/25/2019 0911   NA 136 03/12/2017 1115   K 4.3 01/25/2019 0911   K 4.0 03/12/2017 1115   CL 106 01/25/2019 0911   CO2 23 01/25/2019 0911   CO2 25 03/12/2017 1115   BUN 12 01/25/2019 0911   BUN 12.4 03/12/2017 1115   CREATININE 0.77 01/25/2019 0911   CREATININE 0.7 03/12/2017 1115      Component Value Date/Time   CALCIUM 9.3 01/25/2019 0911   CALCIUM 9.1  03/12/2017 1115   ALKPHOS 70 01/25/2019 0911   ALKPHOS 57 03/12/2017 1115   AST 22 01/25/2019 0911   AST 34 03/12/2017 1115   ALT 18 01/25/2019 0911   ALT 33 03/12/2017 1115   BILITOT 0.3 01/25/2019 0911   BILITOT 0.41 03/12/2017 1115       No results found for: LABCA2  No components found for: LABCA125  No results for input(s): INR in the last 168 hours.  Urinalysis    Component Value Date/Time   COLORURINE YELLOW 01/31/2016 1526   APPEARANCEUR CLEAR 01/31/2016 1526   LABSPEC 1.006 01/31/2016 1526   PHURINE 6.5 01/31/2016 1526   GLUCOSEU NEGATIVE 01/31/2016 1526   HGBUR NEGATIVE 01/31/2016 1526   BILIRUBINUR NEGATIVE 01/31/2016 1526   KETONESUR NEGATIVE 01/31/2016 1526   PROTEINUR NEGATIVE 01/31/2016 1526   UROBILINOGEN 0.2 12/11/2014 1506   NITRITE NEGATIVE 01/31/2016 1526   LEUKOCYTESUR TRACE (A) 01/31/2016 1526     STUDIES: No results found.   ELIGIBLE FOR AVAILABLE RESEARCH PROTOCOL: no   ASSESSMENT: 68 y.o. [place] woman status post right breast upper outer quadrant lumpectomy 03/26/2016 for a pT1c pN1, stage IIA invasive ductal carcinoma, grade 1, estrogen and progesterone receptor positive, HER-2 nonamplified, with an MIB-1 of 12%   (a) mammaprint from this tumor was read as "low risk".   (1)  a second right breast cancer, invasive lobular, grade 1, measuring 0.6 cm, focally involved the final right medial margin from the 03/26/2016 procedure; this tumor also was estrogen and progesterone receptor positive, HER-2 negative, with an MIB-1 of 3%  (a) medial margin cleared with additional surgery 05/20/2016.  (2) two biopsies from the left breast 04/09/2016 and 04/11/2016, 4.6 cm apart, showed  (a) centrally, an invasive ductal carcinoma, grade 1 or 2, with no prognostic panel available  (b) in the upper outer quadrant, invasive ductal carcinoma, grade 2, estrogen receptor 10% positive, progesterone receptor and HER-2 negative, with an MIB-1 of 2%  (c) left  axillary lymph node biopsy 04/14/2016 showed invasive ductal carcinoma, estrogen and progesterone receptor negative  (d) mammaprint from (2)(c) was also "low risk"  (3) left lumpectomy 05/20/2016 showed a  pT1c pN1 stage IIA  invasive ductal carcinoma, grade 2, estrogen receptor positive, progesterone receptor negative, HER-2 nonamplified, with an MIB-1 of 2%; margins were positive   (4) status post left nipple sparing mastectomy with immediate expander placement 07/01/2016 showing multiple foci of residual grade 2 invasive ductal carcinoma, the largest measuring 0.7 cm, with evidence of lymphovascular invasion and multiple close but negative margins  (a) silicone implant placement 04/21/2017  (5) radiation therapy 08/19/16-03/05/18with capecitabine sensitization Site/dose:   1) Right breast/ 50Gy in 25 fractions                         2) Right breast boost/ 10 Gy in 5 fractions                         3) Left breast 50.4 Gy in 28 fractions   (6) tamoxifen started 04/18/2016--to be continued a minimum of 5 years, likely followed by anastrozole x 2 years  (a) endometrial thickening and polyp noted March 2020  (b) discontinued April 2020, resumed June 2020  (7) anastrozole started 11/18/2018, stopped June 2020 due to side effects  (a) osteopenia, with T score -1.9 on bone density scan on 04/22/2018   PLAN: Sabrina Tapia is coming up on 3 years from her right lumpectomy, 2-1/2 years from her left mastectomy.  There is no evidence of disease recurrence.  This is very favorable.  She developed endometrial bleeding possibly from tamoxifen, finally underwent curettage which showed a polyp, which was not malignant and may have been the cause of the bleeding.  She gave anastrozole a try but was not able to tolerate it.  She is now back on tamoxifen, with good tolerance.  The plan for now is to continue tamoxifen and minimum of 5 years, and maximum of 10.  If she has further bleeding she is contemplating  the possibility of hysterectomy and bilateral salpingo-oophorectomy.  We discussed insomnia issues at length today and she has a list of suggestions to follow which hopefully will be of help to her.  We reviewed her left axillary ultrasound results.  She does have a palpable mass in that area which perhaps is a little bit smaller.  So long as it does not change we do not need any further specific evaluation.  She is due for right mammogram in September.  If she sees Dr. Donne Hazel shortly after that then she can return to see me April of next year.  I would continue to see her on a yearly basis from that point  She knows to call for any other issues that may develop before the next visit.  Koleson Reifsteck, Sabrina Dad, MD  01/25/19 10:29 AM Medical Oncology and Hematology [facility] [address] [place], [place] [address] Tel. [phone]    Fax. [phone]   I, Wilburn Mylar, am acting as scribe for Dr. Virgie Tapia. Edward Trevino.  I, Lurline Del MD, have reviewed the above documentation for accuracy and completeness, and I agree with the above.

## 2019-03-02 ENCOUNTER — Ambulatory Visit: Payer: Medicare Other | Admitting: Obstetrics and Gynecology

## 2019-03-12 ENCOUNTER — Other Ambulatory Visit: Payer: Self-pay | Admitting: Oncology

## 2019-03-22 DIAGNOSIS — M25561 Pain in right knee: Secondary | ICD-10-CM | POA: Insufficient documentation

## 2019-03-23 ENCOUNTER — Other Ambulatory Visit: Payer: Self-pay

## 2019-03-24 NOTE — Progress Notes (Signed)
68 y.o. G0P0 Single Caucasian female here for annual exam.    No vaginal bleeding.  Back on Tamoxifen.  She did not tolerate Anastrozole. Couple of hot flashes a day.   Has overactive bladder.   Tried Myrbetriq and had constipation.  Controls her bladder by controlling her beverage choices.   Right knee problems.  Motrin working well.  PCP:  Dr. Josetta Huddle   Patient's last menstrual period was 07/28/1996 (approximate).           Sexually active: No.  The current method of family planning is post menopausal status.    Exercising: No.  The patient does not participate in regular exercise at present. Smoker:  Former  Health Maintenance: Pap:01-30-17 Neg:Neg HR HPV, 12-11-14 Neg  History of abnormal Pap: yes MMG:  12-18-19Rt.lumpectomy/ Palpable areas in [place], likely represent benign fat necrosis. A 6 month f/u ultrasound of Lt. Axilla is recommended/density C/BiRads3 Colonoscopy:   08-2014 polyps, f/u 5 years  BMD: 04-22-18  Result :Osteopenia TDaP:  PCP Gardasil:   n/a HIV: 12-10-16 NR Hep C: 12-10-16 Neg Screening Labs:  PCP   reports that she quit smoking about 28 years ago. Her smoking use included cigarettes. She has a 13.00 pack-year smoking history. She has never used smokeless tobacco. She reports current alcohol use. She reports previous drug use. Drug: Marijuana.  Past Medical History:  Diagnosis Date  . Allergy-induced asthma    prn inhaler  . Asthma    allergy induced  . Basal cell carcinoma    right clavicle  . Bruises easily   . Constipation   . Dental crowns present   . Endometrial thickening on ultrasound   . Family history of adverse reaction to anesthesia    pt's father has hx. of being hard to wake up post-op  . GERD (gastroesophageal reflux disease)   . Hemorrhoids   . History of breast cancer 2017   bilateral  . History of radiation therapy    08/19/16- 09/29/16, Right Breast 50 Gy in 25 fractions, Right Breast boost 10 Gy in 5 fractions, Left  Breast 50.4 Gy in 28 fractions  . Hot flashes   . Immature cataract   . Migraines   . Osteoarthritis    hands and feet  . Osteopenia 02/2016   T score -1.3 FRAX 8.3%/0.7% stable from prior DEXA  . Overactive bladder   . PMB (postmenopausal bleeding)   . Pneumonia    History of  . Seasonal allergies   . Thyroid nodule 2012   Solitary nodule in the lower pole of the left thyroid lobe , pt unaware  . Tingling of upper extremity   . Wears glasses     Past Surgical History:  Procedure Laterality Date  . BREAST RECONSTRUCTION WITH PLACEMENT OF TISSUE EXPANDER AND FLEX HD (ACELLULAR HYDRATED DERMIS) Left 07/01/2016   Procedure: BREAST RECONSTRUCTION WITH PLACEMENT OF TISSUE EXPANDER AND  ALLODERM;  Surgeon: Irene Limbo, MD;  Location: [place];  Service: Plastics;  Laterality: Left;  . COLONOSCOPY  2011  . DILATATION & CURETTAGE/HYSTEROSCOPY WITH MYOSURE N/A 12/06/2018   Procedure: DILATATION & CURETTAGE/HYSTEROSCOPY WITH MYOSURE;  Surgeon: [name], MD;  Location: [facility];  Service: Gynecology;  Laterality: N/A;  follow 1st case  . INCISION AND DRAINAGE Left 06/02/2016   Procedure: DRAINAGE of left axilla seroma;  Surgeon: Rolm Bookbinder, MD;  Location: [place];  Service: General;  Laterality: Left;  . LIPOSUCTION WITH LIPOFILLING  Left 06/11/2018   Procedure: Lipofilling from abdomen to left chest;  Surgeon: Irene Limbo, MD;  Location: [place];  Service: Plastics;  Laterality: Left;  Marland Kitchen MASTOPEXY Right 04/17/2017   Procedure: RIGHT MASTOPEXY;  Surgeon: Irene Limbo, MD;  Location: [place];  Service: Plastics;  Laterality: Right;  . NIPPLE SPARING MASTECTOMY Left 07/01/2016   Procedure: LEFT NIPPLE SPARING MASTECTOMY;  Surgeon: Rolm Bookbinder, MD;  Location: [place];  Service: General;  Laterality: Left;  . RADIOACTIVE SEED GUIDED PARTIAL MASTECTOMY  WITH AXILLARY SENTINEL LYMPH NODE BIOPSY Right 03/26/2016   Procedure: RIGHT BREAST LUMPECTOMY WITH RADIOACTIVE SEED AND SENTINEL LYMPH NODE BIOPSY;  Surgeon: Rolm Bookbinder, MD;  Location: [place];  Service: General;  Laterality: Right;  . RADIOACTIVE SEED GUIDED PARTIAL MASTECTOMY WITH AXILLARY SENTINEL LYMPH NODE BIOPSY Left 05/20/2016   Procedure: LEFT BREAST RADIOACTIVE SEED (two seeds) GUIDED LUMPECTOMY WITH LEFT AXILLARY SENTINEL LYMPH NODE BIOPSY AND LEFT SEED TARGETED AXILLARY LYMPH NODE EXCISION;  Surgeon: Rolm Bookbinder, MD;  Location: [place];  Service: General;  Laterality: Left;  . RE-EXCISION OF BREAST CANCER,SUPERIOR MARGINS Right 05/20/2016   Procedure: RE-EXCISION OF RIGHT BREAST MARGIN;  Surgeon: Rolm Bookbinder, MD;  Location: [place];  Service: General;  Laterality: Right;  . RE-EXCISION OF BREAST CANCER,SUPERIOR MARGINS Left 06/02/2016   Procedure: RE-EXCISION OF BREAST CANCER,SUPERIOR MARGINS;  Surgeon: Rolm Bookbinder, MD;  Location: [place];  Service: General;  Laterality: Left;  . REMOVAL OF TISSUE EXPANDER AND PLACEMENT OF IMPLANT Left 04/17/2017   Procedure: REMOVAL OF LEFT TISSUE EXPANDER WITH PLACEMENT OF LEFT SILICONE BREAST IMPLANT;  Surgeon: Irene Limbo, MD;  Location: [place];  Service: Plastics;  Laterality: Left;    Current Outpatient Medications  Medication Sig Dispense Refill  . acetaminophen (TYLENOL) 500 MG tablet Take 500 mg by mouth every 6 (six) hours as needed for headache.    . albuterol (PROAIR HFA) 108 (90 Base) MCG/ACT inhaler Inhale 2 puffs into the lungs every 4 (four) hours as needed for wheezing or shortness of breath. 1 Inhaler 1  . ANUCORT-HC 25 MG suppository     . Azelastine-Fluticasone (DYMISTA) 137-50 MCG/ACT SUSP 2 (two) times a day.     Marland Kitchen CALCIUM-MAGNESIUM PO Take by mouth.    . cetirizine (ZYRTEC) 10 MG tablet Take 10 mg by mouth every  evening.     . Cholecalciferol (VITAMIN D) 2000 units tablet Take 4,000 Units by mouth daily.     . diclofenac sodium (VOLTAREN) 1 % GEL Apply 2 g topically 3 (three) times daily. 100 g 0  . docusate sodium (COLACE) 50 MG capsule Take 100 mg by mouth every evening.     Marland Kitchen FLOVENT HFA 110 MCG/ACT inhaler INHALE 1 PUFF BY MOUTH INTO THE LUNGS TWICE DAILY 12 g 0  . hydrocortisone (ANUSOL-HC) 2.5 % rectal cream Place 1 application rectally as needed for hemorrhoids or itching.    Marland Kitchen ibuprofen (ADVIL) 400 MG tablet Take 400 mg by mouth every 6 (six) hours as needed for mild pain.    Marland Kitchen omeprazole (PRILOSEC) 20 MG capsule     . OVER THE COUNTER MEDICATION Take by mouth as needed. FLORA RESTORE  When using antiobiotics    . OVER THE COUNTER MEDICATION Take by mouth daily. [place]    . PRILOSEC OTC 20 MG tablet every morning. Reported on 12/14/2015    . psyllium (METAMUCIL) 58.6 % packet Take 1  packet by mouth daily.    . tamoxifen (NOLVADEX) 10 MG tablet Take 10 mg by mouth daily.    . valACYclovir (VALTREX) 1000 MG tablet     . venlafaxine XR (EFFEXOR-XR) 75 MG 24 hr capsule TAKE 1 CAPSULE(75 MG) BY MOUTH DAILY WITH BREAKFAST 90 capsule 0  . anastrozole (ARIMIDEX) 1 MG tablet Take 1 mg by mouth daily.     No current facility-administered medications for this visit.     Family History  Problem Relation Age of Onset  . Hypertension Mother   . Heart disease Mother   . Lung disease Mother   . Hypertension Father   . Cancer Father        colon  . Heart disease Father   . Lung cancer Father   . Anesthesia problems Father        hard to wake up post-op  . Breast cancer Paternal Grandmother        Age 66's  . Hypertension Brother   . Hyperlipidemia Brother   . Diabetes Maternal Grandfather     Review of Systems  Constitutional: Negative.   HENT: Negative.   Eyes: Negative.   Respiratory: Negative.   Cardiovascular: Negative.   Gastrointestinal: Negative.   Endocrine: Negative.    Genitourinary: Negative.   Musculoskeletal: Negative.   Skin: Negative.   Allergic/Immunologic: Negative.   Neurological: Negative.   Hematological: Negative.   Psychiatric/Behavioral: Negative.     Exam:   BP 124/76 (BP Location: Right Arm, Patient Position: Sitting, Cuff Size: Normal)   Pulse 74   Temp (!) 97.3 F (36.3 C) (Temporal)   Ht 5' 3.75" (1.619 m)   Wt 162 lb 6.4 oz (73.7 kg)   LMP 07/28/1996 (Approximate) Comment: pmb bleeding  BMI 28.09 kg/m     General appearance: alert, cooperative and appears stated age Head: normocephalic, without obvious abnormality, atraumatic Neck: no adenopathy, supple, symmetrical, trachea midline and thyroid normal to inspection and palpation Lungs: clear to auscultation bilaterally Breasts: scar of right breast ,no masses or tenderness, No nipple retraction or dimpling, No nipple discharge or bleeding, No axillary or supraclavicular adenopathy Left breast absent, reconstruction present.  No axillary or supraclavicular adenopathy.  Heart: regular rate and rhythm Abdomen: soft, non-tender; no masses, no organomegaly Extremities: extremities normal, atraumatic, no cyanosis or edema Skin: skin color, texture, turgor normal. No rashes or lesions Lymph nodes: cervical, supraclavicular, and axillary nodes normal. Neurologic: grossly normal  Pelvic: External genitalia:  no lesions              No abnormal inguinal nodes palpated.              Urethra:  normal appearing urethra with no masses, tenderness or lesions              Bartholins and Skenes: normal                 Vagina: normal appearing vagina with normal color and discharge, no lesions              Cervix: no lesions              Pap taken: Yes.   Bimanual Exam:  Uterus:  normal size, contour, position, consistency, mobility, non-tender              Adnexa: no mass, fullness, tenderness              Rectal exam: Yes.  .  Confirms.  Anus:  normal sphincter tone, no  lesions  Chaperone was present for exam.  Assessment:   Well woman visit with normal exam. Bilateral breast cancer. Status post left mastectomy with reconstruction. On Tamoxifen.  Overactive bladder. Not on medication. Not candidate for Myrbetriq due to Tamoxifen use.  Menopausal symptoms. On Effexor.  Osteopenia.  FH breast and colon cancer.  Hx oral HSV.  Plan: Mammogram screening discussed. Self breast awareness reviewed. Pap and HR HPV as above. Guidelines for Calcium, Vitamin D, regular exercise program including cardiovascular and weight bearing exercise. BMD 2021.  Follow up annually and prn.  After visit summary provided.

## 2019-03-25 ENCOUNTER — Other Ambulatory Visit (HOSPITAL_COMMUNITY)
Admission: RE | Admit: 2019-03-25 | Discharge: 2019-03-25 | Disposition: A | Discharge status: Home / Self Care | Payer: Medicare Other | Source: Ambulatory Visit | Attending: Obstetrics and Gynecology | Admitting: Obstetrics and Gynecology

## 2019-03-25 ENCOUNTER — Other Ambulatory Visit: Payer: Self-pay

## 2019-03-25 ENCOUNTER — Ambulatory Visit (INDEPENDENT_AMBULATORY_CARE_PROVIDER_SITE_OTHER): Payer: Medicare Other | Admitting: Obstetrics and Gynecology

## 2019-03-25 ENCOUNTER — Encounter: Payer: Self-pay | Admitting: Obstetrics and Gynecology

## 2019-03-25 VITALS — BP 124/76 | HR 74 | Temp 97.3°F | Ht 63.75 in | Wt 162.4 lb

## 2019-03-25 DIAGNOSIS — Z01419 Encounter for gynecological examination (general) (routine) without abnormal findings: Secondary | ICD-10-CM | POA: Insufficient documentation

## 2019-03-25 NOTE — Patient Instructions (Signed)

## 2019-03-28 DIAGNOSIS — M7051 Other bursitis of knee, right knee: Secondary | ICD-10-CM | POA: Insufficient documentation

## 2019-03-29 LAB — CYTOLOGY - PAP: Diagnosis: NEGATIVE

## 2019-04-18 ENCOUNTER — Other Ambulatory Visit: Payer: Self-pay | Admitting: Oncology

## 2019-04-19 ENCOUNTER — Other Ambulatory Visit: Payer: Self-pay | Admitting: Oncology

## 2019-04-19 DIAGNOSIS — Z170 Estrogen receptor positive status [ER+]: Secondary | ICD-10-CM

## 2019-04-19 DIAGNOSIS — C50411 Malignant neoplasm of upper-outer quadrant of right female breast: Secondary | ICD-10-CM

## 2019-04-19 DIAGNOSIS — C50812 Malignant neoplasm of overlapping sites of left female breast: Secondary | ICD-10-CM

## 2019-04-28 ENCOUNTER — Telehealth: Payer: Self-pay | Admitting: Oncology

## 2019-04-28 NOTE — Telephone Encounter (Signed)
GM PAL 10/19 appointment moved to 10/23. Not able to reach patient or leave message. Schedule mailed. Patient also Mychart active.

## 2019-05-02 ENCOUNTER — Telehealth: Payer: Self-pay | Admitting: Oncology

## 2019-05-02 NOTE — Telephone Encounter (Signed)
Returned patient's phone call regarding upcoming appointments, patient wanted to confirm date and time. °

## 2019-05-04 ENCOUNTER — Encounter: Payer: Self-pay | Admitting: Gynecology

## 2019-05-11 ENCOUNTER — Encounter: Payer: Self-pay | Admitting: Oncology

## 2019-05-12 ENCOUNTER — Other Ambulatory Visit: Payer: Self-pay

## 2019-05-12 ENCOUNTER — Inpatient Hospital Stay: Payer: Medicare Other | Attending: Oncology

## 2019-05-12 ENCOUNTER — Inpatient Hospital Stay (HOSPITAL_BASED_OUTPATIENT_CLINIC_OR_DEPARTMENT_OTHER): Payer: Medicare Other | Admitting: Oncology

## 2019-05-12 VITALS — BP 153/89 | HR 72 | Temp 98.5°F | Resp 18 | Ht 63.75 in | Wt 163.5 lb

## 2019-05-12 DIAGNOSIS — Z79810 Long term (current) use of selective estrogen receptor modulators (SERMs): Secondary | ICD-10-CM | POA: Insufficient documentation

## 2019-05-12 DIAGNOSIS — Z87891 Personal history of nicotine dependence: Secondary | ICD-10-CM | POA: Diagnosis not present

## 2019-05-12 DIAGNOSIS — M8580 Other specified disorders of bone density and structure, unspecified site: Secondary | ICD-10-CM | POA: Insufficient documentation

## 2019-05-12 DIAGNOSIS — Z800 Family history of malignant neoplasm of digestive organs: Secondary | ICD-10-CM | POA: Insufficient documentation

## 2019-05-12 DIAGNOSIS — Z801 Family history of malignant neoplasm of trachea, bronchus and lung: Secondary | ICD-10-CM | POA: Insufficient documentation

## 2019-05-12 DIAGNOSIS — M1990 Unspecified osteoarthritis, unspecified site: Secondary | ICD-10-CM | POA: Diagnosis not present

## 2019-05-12 DIAGNOSIS — Z79899 Other long term (current) drug therapy: Secondary | ICD-10-CM | POA: Insufficient documentation

## 2019-05-12 DIAGNOSIS — Z170 Estrogen receptor positive status [ER+]: Secondary | ICD-10-CM

## 2019-05-12 DIAGNOSIS — C50812 Malignant neoplasm of overlapping sites of left female breast: Secondary | ICD-10-CM | POA: Diagnosis not present

## 2019-05-12 DIAGNOSIS — Z85828 Personal history of other malignant neoplasm of skin: Secondary | ICD-10-CM | POA: Diagnosis not present

## 2019-05-12 DIAGNOSIS — C50411 Malignant neoplasm of upper-outer quadrant of right female breast: Secondary | ICD-10-CM | POA: Diagnosis not present

## 2019-05-12 DIAGNOSIS — C50412 Malignant neoplasm of upper-outer quadrant of left female breast: Secondary | ICD-10-CM | POA: Diagnosis present

## 2019-05-12 DIAGNOSIS — Z803 Family history of malignant neoplasm of breast: Secondary | ICD-10-CM | POA: Diagnosis not present

## 2019-05-12 DIAGNOSIS — K219 Gastro-esophageal reflux disease without esophagitis: Secondary | ICD-10-CM | POA: Diagnosis not present

## 2019-05-12 LAB — COMPREHENSIVE METABOLIC PANEL
ALT: 20 U/L (ref 0–44)
AST: 22 U/L (ref 15–41)
Albumin: 3.8 g/dL (ref 3.5–5.0)
Alkaline Phosphatase: 68 U/L (ref 38–126)
Anion gap: 8 (ref 5–15)
BUN: 14 mg/dL (ref 8–23)
CO2: 27 mmol/L (ref 22–32)
Calcium: 8.7 mg/dL — ABNORMAL LOW (ref 8.9–10.3)
Chloride: 102 mmol/L (ref 98–111)
Creatinine, Ser: 0.84 mg/dL (ref 0.44–1.00)
GFR calc Af Amer: >60 mL/min (ref >60)
GFR calc non Af Amer: >60 mL/min (ref >60)
Glucose, Bld: 87 mg/dL (ref 70–99)
Potassium: 3.7 mmol/L (ref 3.5–5.1)
Sodium: 137 mmol/L (ref 135–145)
Total Bilirubin: 0.3 mg/dL (ref 0.3–1.2)
Total Protein: 6.5 g/dL (ref 6.5–8.1)

## 2019-05-12 LAB — CBC WITH DIFFERENTIAL/PLATELET
Abs Immature Granulocytes: 0.01 10*3/uL (ref 0.00–0.07)
Basophils Absolute: 0 10*3/uL (ref 0.0–0.1)
Basophils Relative: 1 %
Eosinophils Absolute: 0.3 10*3/uL (ref 0.0–0.5)
Eosinophils Relative: 4 %
HCT: 40.1 % (ref 36.0–46.0)
Hemoglobin: 13.3 g/dL (ref 12.0–15.0)
Immature Granulocytes: 0 %
Lymphocytes Relative: 26 %
Lymphs Abs: 1.7 10*3/uL (ref 0.7–4.0)
MCH: 31.2 pg (ref 26.0–34.0)
MCHC: 33.2 g/dL (ref 30.0–36.0)
MCV: 94.1 fL (ref 80.0–100.0)
Monocytes Absolute: 0.7 10*3/uL (ref 0.1–1.0)
Monocytes Relative: 11 %
Neutro Abs: 3.9 10*3/uL (ref 1.7–7.7)
Neutrophils Relative %: 58 %
Platelets: 314 10*3/uL (ref 150–400)
RBC: 4.26 MIL/uL (ref 3.87–5.11)
RDW: 12.9 % (ref 11.5–15.5)
WBC: 6.6 10*3/uL (ref 4.0–10.5)
nRBC: 0 % (ref 0.0–0.2)

## 2019-05-12 MED ORDER — TAMOXIFEN CITRATE 10 MG PO TABS: 10.0000 mg | ORAL_TABLET | Freq: Every day | ORAL | 4 refills | Status: DC

## 2019-05-12 MED ORDER — GABAPENTIN 300 MG PO CAPS: 300.0000 mg | ORAL_CAPSULE | Freq: Every day | ORAL | 4 refills | Status: DC

## 2019-05-12 NOTE — Progress Notes (Signed)
[place]  [phone] [phone]) [phone]    ID: Sabrina Tapia DOB: 01-09-1951  MR#: [id number]  DVV#:[id number]  Patient Care Team: Josetta Huddle, MD as PCP - General (Internal Medicine) Rolm Bookbinder, MD as Consulting Physician (General Surgery) Jannatul Wojdyla, Virgie Dad, MD as Consulting Physician (Oncology) Phineas Real, Belinda Block, MD as Consulting Physician (Gynecology) Delice Bison, Charlestine Massed, NP as Nurse Practitioner (Hematology and Oncology) Yisroel Ramming, Everardo All, MD as Consulting Physician (Obstetrics and Gynecology) OTHER MD:   CHIEF COMPLAINT: Estrogen receptor positive breast cancer  CURRENT TREATMENT: Tamoxifen   INTERVAL HISTORY: Sabrina Tapia returns today for follow-up and treatment of her estrogen receptor positive breast cancer. She was last seen here on 01/25/2019.   She continues on tamoxifen.  She tolerates this well, with no significant side effects that she is aware of  She had her right mammography at [facility] yesterday.  We do not yet have the results but she tells me she was told it was "fine".   REVIEW OF SYSTEMS: Sabrina Tapia is playing golf 3-4 times a week.  She does 18 holes.  She will not tell me her scores but she says they "very".  This morning she went for a long walk and took multiple signs of political signs on people's yards.  She has had significant migraines and was started on "a drug", which she has not yet purchased, by her primary care physician, likely sumatriptan.  Otherwise a detailed review of systems today was stable.   BREAST CANCER HISTORY: From the earlier summary note:  "Sabrina Tapia" had screening mammography at her gynecologist's office suggestive of a change in the upper-outer quadrant of the right breast. She was referred to [facility] where on 03/04/2016 she underwent bilateral diagnostic mammography and right breast ultrasonography. The breast density was category C. In the upper outer quadrant of the right breast there  was a new area of architectural distortion. By ultrasonography this measured 0.5 cm. The right axilla was reported as sonographically benign.  On 03/06/2016 she underwent core needle biopsy of the right breast mass in question, and this showed ([id number]) an invasive ductal carcinoma, grade 1, estrogen receptor 95% positive, progesterone receptor 95% positive, both with strong staining intensity, with an MIB-1 of 12%, and no HER-2 amplification, the signals ratio being 1.54 and the number per cell 2.15.  Her case was presented in the multidisciplinary breast cancer conference 03/12/2016.Marland Kitchen At that time a preliminary plan was proposed: Breast conserving surgery with sentinel lymph node sampling, no Oncotype if the tumor was no larger than 5 mm, and consideration of genetics testing.  On 03/26/2016 Sabrina Tapia underwent right lumpectomy and sentinel lymph node sampling. This showed ([id number]) an invasive ductal carcinoma measuring 1.4 cm, grade 1, with negative margins, and a macrometastatic deposit of ductal carcinoma in the single sentinel lymph node. Mammaprint from this tumor was read as "low risk".  However there was a separate invasive lobular carcinoma measuring 0.6 cm,  focally involving the final right medial margin. Because of the lobular breast cancer bilateral breast MRI was obtained 04/04/2016. This showed in the right breast a 7.4 x 4.5 cm seroma with 2 enhancing masses posterior to the lumpectomy cavity, the larger measuring 1.0 cm. These were felt possibly to be reactive lymph nodes. One of these lymph nodes was biopsied 04/09/2016 and this showed ([id number]) benign lymph node tissue. The MRI showed no evidence of additional disease in the right breast.  However in the left breast centrally there was a  1.3 cm enhancing mass. Biopsy of this left breast mass 04/09/2016 ([id number]) showed an invasive ductal carcinoma, grade 1 or 2, with insufficient tissue for a prognostic panel. On  04/11/2016 biopsy of a second left breast mass, upper outer quadrant, 4.6 cm a way from the other left breast mass, showed ([id number]) an invasive ductal carcinoma, grade 2, estrogen receptor 10% positive with strong staining intensity, progesterone receptor negative, with an MIB-1 of 2%, and no HER-2 amplification, the signals ratio being 1.50 and the number per cell 2.10. The proliferation fraction was 2%.  On 04/14/2016 Sabrina Tapia underwent biopsy of a suspicious left axillary lymph node and this ([id number]) was positive for invasive ductal carcinoma, estrogen and progesterone receptor negative, with an MIB-1 of 10%. I do not find that HER-2 was repeated. Mammaprint from this tumor also came back low risk.  Her subsequent history is as detailed below.   PAST MEDICAL HISTORY: Past Medical History:  Diagnosis Date  . Allergy-induced asthma    prn inhaler  . Asthma    allergy induced  . Basal cell carcinoma    right clavicle  . Bruises easily   . Constipation   . Dental crowns present   . Endometrial thickening on ultrasound   . Family history of adverse reaction to anesthesia    pt's father has hx. of being hard to wake up post-op  . GERD (gastroesophageal reflux disease)   . Hemorrhoids   . History of breast cancer 2017   bilateral  . History of radiation therapy    08/19/16- 09/29/16, Right Breast 50 Gy in 25 fractions, Right Breast boost 10 Gy in 5 fractions, Left Breast 50.4 Gy in 28 fractions  . Hot flashes   . Immature cataract   . Migraines   . Osteoarthritis    hands and feet  . Osteopenia 02/2016   T score -1.3 FRAX 8.3%/0.7% stable from prior DEXA  . Overactive bladder   . PMB (postmenopausal bleeding)   . Pneumonia    History of  . Seasonal allergies   . Thyroid nodule 2012   Solitary nodule in the lower pole of the left thyroid lobe , pt unaware  . Tingling of upper extremity   . Wears glasses     PAST SURGICAL HISTORY: Past Surgical History:  Procedure  Laterality Date  . BREAST RECONSTRUCTION WITH PLACEMENT OF TISSUE EXPANDER AND FLEX HD (ACELLULAR HYDRATED DERMIS) Left 07/01/2016   Procedure: BREAST RECONSTRUCTION WITH PLACEMENT OF TISSUE EXPANDER AND  ALLODERM;  Surgeon: Irene Limbo, MD;  Location: [facility];  Service: Plastics;  Laterality: Left;  . COLONOSCOPY  2011  . DILATATION & CURETTAGE/HYSTEROSCOPY WITH MYOSURE N/A 12/06/2018   Procedure: DILATATION & CURETTAGE/HYSTEROSCOPY WITH MYOSURE;  Surgeon: [name], MD;  Location: [facility];  Service: Gynecology;  Laterality: N/A;  follow 1st case  . INCISION AND DRAINAGE Left 06/02/2016   Procedure: DRAINAGE of left axilla seroma;  Surgeon: Rolm Bookbinder, MD;  Location: [facility];  Service: General;  Laterality: Left;  . LIPOSUCTION WITH LIPOFILLING Left 06/11/2018   Procedure: Lipofilling from abdomen to left chest;  Surgeon: Irene Limbo, MD;  Location: [place];  Service: Plastics;  Laterality: Left;  Marland Kitchen MASTOPEXY Right 04/17/2017   Procedure: RIGHT MASTOPEXY;  Surgeon: Irene Limbo, MD;  Location: [place];  Service: Plastics;  Laterality: Right;  . NIPPLE SPARING MASTECTOMY Left 07/01/2016   Procedure: LEFT NIPPLE SPARING MASTECTOMY;  Surgeon: Rolm Bookbinder, MD;  Location: [facility];  Service: General;  Laterality: Left;  . RADIOACTIVE SEED GUIDED PARTIAL MASTECTOMY WITH AXILLARY SENTINEL LYMPH NODE BIOPSY Right 03/26/2016   Procedure: RIGHT BREAST LUMPECTOMY WITH RADIOACTIVE SEED AND SENTINEL LYMPH NODE BIOPSY;  Surgeon: Rolm Bookbinder, MD;  Location: [facility];  Service: General;  Laterality: Right;  . RADIOACTIVE SEED GUIDED PARTIAL MASTECTOMY WITH AXILLARY SENTINEL LYMPH NODE BIOPSY Left 05/20/2016   Procedure: LEFT BREAST RADIOACTIVE SEED (two seeds) GUIDED LUMPECTOMY WITH LEFT AXILLARY SENTINEL LYMPH NODE BIOPSY AND LEFT SEED TARGETED  AXILLARY LYMPH NODE EXCISION;  Surgeon: Rolm Bookbinder, MD;  Location: [place];  Service: General;  Laterality: Left;  . RE-EXCISION OF BREAST CANCER,SUPERIOR MARGINS Right 05/20/2016   Procedure: RE-EXCISION OF RIGHT BREAST MARGIN;  Surgeon: Rolm Bookbinder, MD;  Location: [place];  Service: General;  Laterality: Right;  . RE-EXCISION OF BREAST CANCER,SUPERIOR MARGINS Left 06/02/2016   Procedure: RE-EXCISION OF BREAST CANCER,SUPERIOR MARGINS;  Surgeon: Rolm Bookbinder, MD;  Location: [facility];  Service: General;  Laterality: Left;  . REMOVAL OF TISSUE EXPANDER AND PLACEMENT OF IMPLANT Left 04/17/2017   Procedure: REMOVAL OF LEFT TISSUE EXPANDER WITH PLACEMENT OF LEFT SILICONE BREAST IMPLANT;  Surgeon: Irene Limbo, MD;  Location: [facility];  Service: Plastics;  Laterality: Left;    FAMILY HISTORY Family History  Problem Relation Age of Onset  . Hypertension Mother   . Heart disease Mother   . Lung disease Mother   . Hypertension Father   . Cancer Father        colon  . Heart disease Father   . Lung cancer Father   . Anesthesia problems Father        hard to wake up post-op  . Breast cancer Paternal Grandmother        Age 90's  . Hypertension Brother   . Hyperlipidemia Brother   . Diabetes Maternal Grandfather    The patient's father died from lung cancer at the age of 1. He also had a remote history of colon cancer. His mother had a history of breast cancer in her 52s. The patient's mother died at age 54. The patient has one brother, no sisters.  GYNECOLOGIC HISTORY:  Patient's last menstrual period was 07/28/1996 (approximate). Menarche age 35, the patient is GX P0. 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 She lives  alone, with no pets.   ADVANCED DIRECTIVES: In place. She has named her brother Sabrina Tapia as her healthcare part of attorney. He can be reached at [phone]   HEALTH MAINTENANCE: Social History   Tobacco Use  . Smoking status: Former Smoker    Packs/day: 1.00    Years: 13.00    Pack years: 13.00    Types: Cigarettes    Quit date: 07/27/1990    Years since quitting: 28.8  . Smokeless tobacco: Never Used  Substance Use Topics  . Alcohol use: Yes    Comment: 2 beers daily  . Drug use: Not Currently    Types: Marijuana     Colonoscopy: February 2016  PAP:  Bone density: 04/22/2018; -1.9 osteopenia   Allergies  Allergen Reactions  . Hydrocodone Other (See Comments)    Restlessness, "energy"  . Hydrocodone-Acetaminophen Other (See Comments)  .  Penicillins Itching  . Adhesive [Tape] Other (See Comments)    SKIN IRRITATION  . Augmentin [Amoxicillin-Pot Clavulanate] Itching    Current Outpatient Medications  Medication Sig Dispense Refill  . acetaminophen (TYLENOL) 500 MG tablet Take 500 mg by mouth every 6 (six) hours as needed for headache.    . albuterol (PROAIR HFA) 108 (90 Base) MCG/ACT inhaler Inhale 2 puffs into the lungs every 4 (four) hours as needed for wheezing or shortness of breath. 1 Inhaler 1  . ANUCORT-HC 25 MG suppository     . Azelastine-Fluticasone (DYMISTA) 137-50 MCG/ACT SUSP 2 (two) times a day.     Marland Kitchen CALCIUM-MAGNESIUM PO Take by mouth.    . cetirizine (ZYRTEC) 10 MG tablet Take 10 mg by mouth every evening.     . Cholecalciferol (VITAMIN D) 2000 units tablet Take 4,000 Units by mouth daily.     . diclofenac sodium (VOLTAREN) 1 % GEL Apply 2 g topically 3 (three) times daily. 100 g 0  . docusate sodium (COLACE) 50 MG capsule Take 100 mg by mouth every evening.     Marland Kitchen FLOVENT HFA 110 MCG/ACT inhaler INHALE 1 PUFF BY MOUTH INTO THE LUNGS TWICE DAILY 12 g 0  . gabapentin (NEURONTIN) 300 MG capsule Take 1 capsule (300 mg total) by mouth at bedtime. 90  capsule 4  . hydrocortisone (ANUSOL-HC) 2.5 % rectal cream Place 1 application rectally as needed for hemorrhoids or itching.    Marland Kitchen ibuprofen (ADVIL) 400 MG tablet Take 400 mg by mouth every 6 (six) hours as needed for mild pain.    Marland Kitchen omeprazole (PRILOSEC) 20 MG capsule     . OVER THE COUNTER MEDICATION Take by mouth as needed. FLORA RESTORE  When using antiobiotics    . OVER THE COUNTER MEDICATION Take by mouth daily. [place]    . PRILOSEC OTC 20 MG tablet every morning. Reported on 12/14/2015    . psyllium (METAMUCIL) 58.6 % packet Take 1 packet by mouth daily.    . tamoxifen (NOLVADEX) 10 MG tablet Take 1 tablet (10 mg total) by mouth daily. 90 tablet 4  . valACYclovir (VALTREX) 1000 MG tablet     . venlafaxine XR (EFFEXOR-XR) 75 MG 24 hr capsule TAKE 1 CAPSULE(75 MG) BY MOUTH DAILY WITH BREAKFAST 90 capsule 0   No current facility-administered medications for this visit.     OBJECTIVE: Middle-aged white woman who appears well  Vitals:   05/12/19 1557  BP: (!) 153/89  Pulse: 72  Resp: 18  Temp: 98.5 F (36.9 C)  SpO2: 100%   Wt Readings from Last 3 Encounters:  05/12/19 163 lb 8 oz (74.2 kg)  03/25/19 162 lb 6.4 oz (73.7 kg)  01/25/19 163 lb (73.9 kg)   Body mass index is 28.29 kg/m.    ECOG FS:1 - Symptomatic but completely ambulatory  Ocular: Sclerae unicteric, pupils round and equal Ear-nose-throat: Wearing a mask Lymphatic: No cervical or supraclavicular adenopathy Lungs no rales or rhonchi Heart regular rate and rhythm Abd soft, nontender, positive bowel sounds MSK no focal spinal tenderness, no joint edema Neuro: non-focal, well-oriented, appropriate affect Breasts: The right breast is undergone reduction mammoplasty.  It is also status post lumpectomy and radiation.  There is no evidence of recurrence.  On the left she has undergone mastectomy with silicone implant reconstruction.  There is no evidence of local recurrence.  LAB RESULTS:  CMP      Component Value Date/Time   NA 137 05/12/2019 1531   NA  136 03/12/2017 1115   K 3.7 05/12/2019 1531   K 4.0 03/12/2017 1115   CL 102 05/12/2019 1531   CO2 27 05/12/2019 1531   CO2 25 03/12/2017 1115   GLUCOSE 87 05/12/2019 1531   GLUCOSE 78 03/12/2017 1115   BUN 14 05/12/2019 1531   BUN 12.4 03/12/2017 1115   CREATININE 0.84 05/12/2019 1531   CREATININE 0.7 03/12/2017 1115   CALCIUM 8.7 (L) 05/12/2019 1531   CALCIUM 9.1 03/12/2017 1115   PROT 6.5 05/12/2019 1531   PROT 6.6 03/12/2017 1115   ALBUMIN 3.8 05/12/2019 1531   ALBUMIN 3.6 03/12/2017 1115   AST 22 05/12/2019 1531   AST 34 03/12/2017 1115   ALT 20 05/12/2019 1531   ALT 33 03/12/2017 1115   ALKPHOS 68 05/12/2019 1531   ALKPHOS 57 03/12/2017 1115   BILITOT 0.3 05/12/2019 1531   BILITOT 0.41 03/12/2017 1115   GFRNONAA >60 05/12/2019 1531   GFRAA >60 05/12/2019 1531    INo results found for: SPEP, UPEP  Lab Results  Component Value Date   WBC 6.6 05/12/2019   NEUTROABS 3.9 05/12/2019   HGB 13.3 05/12/2019   HCT 40.1 05/12/2019   MCV 94.1 05/12/2019   PLT 314 05/12/2019      Chemistry      Component Value Date/Time   NA 137 05/12/2019 1531   NA 136 03/12/2017 1115   K 3.7 05/12/2019 1531   K 4.0 03/12/2017 1115   CL 102 05/12/2019 1531   CO2 27 05/12/2019 1531   CO2 25 03/12/2017 1115   BUN 14 05/12/2019 1531   BUN 12.4 03/12/2017 1115   CREATININE 0.84 05/12/2019 1531   CREATININE 0.7 03/12/2017 1115      Component Value Date/Time   CALCIUM 8.7 (L) 05/12/2019 1531   CALCIUM 9.1 03/12/2017 1115   ALKPHOS 68 05/12/2019 1531   ALKPHOS 57 03/12/2017 1115   AST 22 05/12/2019 1531   AST 34 03/12/2017 1115   ALT 20 05/12/2019 1531   ALT 33 03/12/2017 1115   BILITOT 0.3 05/12/2019 1531   BILITOT 0.41 03/12/2017 1115       No results found for: LABCA2  No components found for: LABCA125  No results for input(s): INR in the last 168 hours.  Urinalysis    Component Value Date/Time    COLORURINE YELLOW 01/31/2016 1526   APPEARANCEUR CLEAR 01/31/2016 1526   LABSPEC 1.006 01/31/2016 1526   PHURINE 6.5 01/31/2016 1526   GLUCOSEU NEGATIVE 01/31/2016 1526   HGBUR NEGATIVE 01/31/2016 1526   BILIRUBINUR NEGATIVE 01/31/2016 1526   KETONESUR NEGATIVE 01/31/2016 1526   PROTEINUR NEGATIVE 01/31/2016 1526   UROBILINOGEN 0.2 12/11/2014 1506   NITRITE NEGATIVE 01/31/2016 1526   LEUKOCYTESUR TRACE (A) 01/31/2016 1526     STUDIES: No results found.   ELIGIBLE FOR AVAILABLE RESEARCH PROTOCOL: no   ASSESSMENT: 68 y.o. [place] woman status post right breast upper outer quadrant lumpectomy 03/26/2016 for a pT1c pN1, stage IIA invasive ductal carcinoma, grade 1, estrogen and progesterone receptor positive, HER-2 nonamplified, with an MIB-1 of 12%   (a) mammaprint from this tumor was read as "low risk".   (1) a second right breast cancer, invasive lobular, grade 1, measuring 0.6 cm, focally involved the final right medial margin from the 03/26/2016 procedure; this tumor also was estrogen and progesterone receptor positive, HER-2 negative, with an MIB-1 of 3%  (a) medial margin cleared with additional surgery 05/20/2016.  (2) two biopsies from the left breast 04/09/2016 and 04/11/2016,  4.6 cm apart, showed  (a) centrally, an invasive ductal carcinoma, grade 1 or 2, with no prognostic panel available  (b) in the upper outer quadrant, invasive ductal carcinoma, grade 2, estrogen receptor 10% positive, progesterone receptor and HER-2 negative, with an MIB-1 of 2%  (c) left axillary lymph node biopsy 04/14/2016 showed invasive ductal carcinoma, estrogen and progesterone receptor negative  (d) mammaprint from (2)(c) was also "low risk"  (3) left lumpectomy 05/20/2016 showed a  pT1c pN1 stage IIA  invasive ductal carcinoma, grade 2, estrogen receptor positive, progesterone receptor negative, HER-2 nonamplified, with an MIB-1 of 2%; margins were positive   (4) status post left nipple  sparing mastectomy with immediate expander placement 07/01/2016 showing multiple foci of residual grade 2 invasive ductal carcinoma, the largest measuring 0.7 cm, with evidence of lymphovascular invasion and multiple close but negative margins  (a) silicone implant placement 04/21/2017  (5) radiation therapy 08/19/16-03/05/18with capecitabine sensitization Site/dose:   1) Right breast/ 50Gy in 25 fractions                         2) Right breast boost/ 10 Gy in 5 fractions                         3) Left breast 50.4 Gy in 28 fractions   (6) tamoxifen started 04/18/2016--to be continued a minimum of 5 years, likely followed by anastrozole x 2 years  (a) endometrial thickening and polyp noted March 2020  (b) discontinued April 2020, resumed June 2020  (7) anastrozole started 11/18/2018, stopped June 2020 due to side effects  (a) osteopenia, with T score -1.9 on bone density scan on 04/22/2018   PLAN: Sabrina Tapia is now just about 3 years out from definitive surgery for her breast cancers with no evidence of disease recurrence.  This is very favorable.  She is tolerating tamoxifen well and the plan is to continue that for 10 years.  She has a good functional status and I encouraged her to continue her exercise program as before.  At this point I feel comfortable seeing her on a once a year basis and I will see her again after her mammogram next year  She knows to call for any other issue that may develop before then.   Wendel Homeyer, Virgie Dad, MD  05/12/19 5:10 PM Medical Oncology and Hematology [facility] [place], [place] [address] Tel. [phone]    Fax. [phone]  I, Jacqualyn Posey am acting as a Education administrator for Chauncey Cruel, MD.   I, Lurline Del MD, have reviewed the above documentation for accuracy and completeness, and I agree with the above.

## 2019-05-13 ENCOUNTER — Telehealth: Payer: Self-pay | Admitting: Oncology

## 2019-05-13 NOTE — Telephone Encounter (Signed)
I could not reach patient regarding schedule for nov

## 2019-05-16 ENCOUNTER — Ambulatory Visit: Payer: Self-pay | Admitting: Oncology

## 2019-05-16 ENCOUNTER — Other Ambulatory Visit: Payer: Self-pay

## 2019-06-07 ENCOUNTER — Other Ambulatory Visit: Payer: Self-pay | Admitting: Oncology

## 2019-08-03 DIAGNOSIS — L719 Rosacea, unspecified: Secondary | ICD-10-CM | POA: Insufficient documentation

## 2019-08-03 DIAGNOSIS — M1990 Unspecified osteoarthritis, unspecified site: Secondary | ICD-10-CM | POA: Insufficient documentation

## 2019-08-25 ENCOUNTER — Ambulatory Visit: Payer: Medicare Other

## 2019-08-25 ENCOUNTER — Other Ambulatory Visit: Payer: Self-pay | Admitting: Oncology

## 2019-08-30 ENCOUNTER — Ambulatory Visit: Payer: Medicare Other

## 2019-09-01 ENCOUNTER — Ambulatory Visit: Payer: Medicare PPO | Attending: Internal Medicine

## 2019-09-01 DIAGNOSIS — Z23 Encounter for immunization: Secondary | ICD-10-CM | POA: Insufficient documentation

## 2019-09-01 NOTE — Progress Notes (Signed)
   Covid-19 Vaccination Clinic  Name:  Sabrina Tapia    MRN: [id number] DOB: 1951-03-12  09/01/2019  [name] was observed post Covid-19 immunization for 15 minutes without incidence. She was provided with Vaccine Information Sheet and instruction to access the V-Safe system.   [name] was instructed to call 911 with any severe reactions post vaccine: Marland Kitchen Difficulty breathing  . Swelling of your face and throat  . A fast heartbeat  . A bad rash all over your body  . Dizziness and weakness    Immunizations Administered    Name Date Dose VIS Date Route   Pfizer COVID-19 Vaccine 09/01/2019  9:19 AM 0.3 mL 07/08/2019 Intramuscular   Manufacturer: [place]   Lot: [id number]   [place]: [id number]

## 2019-09-26 ENCOUNTER — Ambulatory Visit: Payer: Medicare PPO | Attending: Internal Medicine

## 2019-09-26 DIAGNOSIS — Z23 Encounter for immunization: Secondary | ICD-10-CM

## 2019-09-26 NOTE — Progress Notes (Signed)
   Covid-19 Vaccination Clinic  Name:  Sabrina Tapia    MRN: [id number] DOB: 08-Aug-1950  09/26/2019  [name] was observed post Covid-19 immunization for 15 minutes without incidence. She was provided with Vaccine Information Sheet and instruction to access the V-Safe system.   [name] was instructed to call 911 with any severe reactions post vaccine: Marland Kitchen Difficulty breathing  . Swelling of your face and throat  . A fast heartbeat  . A bad rash all over your body  . Dizziness and weakness    Immunizations Administered    Name Date Dose VIS Date Route   Pfizer COVID-19 Vaccine 09/26/2019  2:00 PM 0.3 mL 07/08/2019 Intramuscular   Manufacturer: [place]   Lot: [id number]   [place]: [id number]

## 2019-11-04 ENCOUNTER — Telehealth: Payer: Self-pay | Admitting: Oncology

## 2019-11-04 NOTE — Telephone Encounter (Signed)
Called per 4/09 sch message - unable pt - left message for pt to call back

## 2019-11-08 ENCOUNTER — Inpatient Hospital Stay: Payer: Medicare PPO

## 2019-11-08 ENCOUNTER — Inpatient Hospital Stay: Payer: Medicare PPO | Admitting: Oncology

## 2019-11-21 ENCOUNTER — Other Ambulatory Visit: Payer: Self-pay | Admitting: Oncology

## 2019-11-23 ENCOUNTER — Other Ambulatory Visit: Payer: Self-pay

## 2019-11-23 DIAGNOSIS — Z170 Estrogen receptor positive status [ER+]: Secondary | ICD-10-CM

## 2019-11-23 DIAGNOSIS — C50411 Malignant neoplasm of upper-outer quadrant of right female breast: Secondary | ICD-10-CM

## 2019-11-23 NOTE — Progress Notes (Signed)
S[facility]  Telephone:([phone] Fax:([phone]) [phone]    ID: KIAN GAMARRA DOB: Dec 18, 1950  MR#: [id number]  ASN#:[id number]  Patient Care Team: Sabrina Huddle, MD as PCP - General (Internal Medicine) Sabrina Bookbinder, MD as Consulting Physician (General Surgery) [place] [facility], Sabrina Dad, MD as Consulting Physician (Oncology) [place], Sabrina Block, MD as Consulting Physician (Gynecology) [place], Sabrina Massed, NP as Nurse Practitioner (Hematology and Oncology) [place], Sabrina All, MD as Consulting Physician (Obstetrics and Gynecology) OTHER MD:   CHIEF COMPLAINT: Estrogen receptor positive breast cancer  CURRENT TREATMENT: Tamoxifen   INTERVAL HISTORY: Sabrina [place]ia returns today for follow-up of her estrogen receptor positive breast cancer.   She continues on tamoxifen.  She is tolerating this with no hot flashes or vaginal issues that she is aware of.  Since her last visit, she has not undergone any additional studies.  Her most recent mammogram was obtained at [facility] 05/11/2019 and showed breast density category B.   REVIEW OF SYSTEMS: Sabrina [place]ia has had both her Pfizer vaccine coronavirus doses.  She had no problems with these.  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  Sabrina [place]ia has noted a little bit of discomfort in the inner lips and under the tongue which is probably related to the mouthguard she uses for sleep apnea treatment.  Aside from these issues a detailed review of systems today was noncontributory   BREAST CANCER HISTORY: From the earlier summary note:  "Sabrina [place]ia" had screening mammography at her gynecologist's office suggestive of a change in the upper-outer quadrant of the right breast. She was referred to [facility] where on 03/04/2016 she underwent bilateral diagnostic mammography and right breast ultrasonography. The breast density was category C. In the upper outer quadrant of the right  breast there was a new area of architectural distortion. By ultrasonography this measured 0.5 cm. The right axilla was reported as sonographically benign.  On 03/06/2016 she underwent core needle biopsy of the right breast mass in question, and this showed ([id number]) an invasive ductal carcinoma, grade 1, estrogen receptor 95% positive, progesterone receptor 95% positive, both with strong staining intensity, with an MIB-1 of 12%, and no HER-2 amplification, the signals ratio being 1.54 and the number per cell 2.15.  Her case was presented in the multidisciplinary breast cancer conference 03/12/2016.Marland Kitchen At that time a preliminary plan was proposed: Breast conserving surgery with sentinel lymph node sampling, no Oncotype if the tumor was no larger than 5 mm, and consideration of genetics testing.  On 03/26/2016 Sabrina [place]ia underwent right lumpectomy and sentinel lymph node sampling. This showed ([id number]) an invasive ductal carcinoma measuring 1.4 cm, grade 1, with negative margins, and a macrometastatic deposit of ductal carcinoma in the single sentinel lymph node. Mammaprint from this tumor was read as "low risk".  However there was a separate invasive lobular carcinoma measuring 0.6 cm,  focally involving the final right medial margin. Because of the lobular breast cancer bilateral breast MRI was obtained 04/04/2016. This showed in the right breast a 7.4 x 4.5 cm seroma with 2 enhancing masses posterior to the lumpectomy cavity, the larger measuring 1.0 cm. These were felt possibly to be reactive lymph nodes. One of these lymph nodes was biopsied 04/09/2016 and this showed ([id number]) benign lymph node tissue. The MRI showed no evidence of additional disease in the right breast.  However in the left breast centrally there was a 1.3 cm enhancing mass. Biopsy of  this left breast mass 04/09/2016 ([id number]) showed an invasive ductal carcinoma, grade 1 or 2, with insufficient tissue for a prognostic  panel. On 04/11/2016 biopsy of a second left breast mass, upper outer quadrant, 4.6 cm a way from the other left breast mass, showed ([id number]) an invasive ductal carcinoma, grade 2, estrogen receptor 10% positive with strong staining intensity, progesterone receptor negative, with an MIB-1 of 2%, and no HER-2 amplification, the signals ratio being 1.50 and the number per cell 2.10. The proliferation fraction was 2%.  On 04/14/2016 Sabrina [place]ia underwent biopsy of a suspicious left axillary lymph node and this ([id number]) was positive for invasive ductal carcinoma, estrogen and progesterone receptor negative, with an MIB-1 of 10%. I do not find that HER-2 was repeated. Mammaprint from this tumor also came back low risk.  Her subsequent history is as detailed below.   PAST MEDICAL HISTORY: Past Medical History:  Diagnosis Date  . Allergy-induced asthma    prn inhaler  . Asthma    allergy induced  . Basal cell carcinoma    right clavicle  . Bruises easily   . Constipation   . Dental crowns present   . Endometrial thickening on ultrasound   . Family history of adverse reaction to anesthesia    pt's father has hx. of being hard to wake up post-op  . GERD (gastroesophageal reflux disease)   . Hemorrhoids   . History of breast cancer 2017   bilateral  . History of radiation therapy    08/19/16- 09/29/16, Right Breast 50 Gy in 25 fractions, Right Breast boost 10 Gy in 5 fractions, Left Breast 50.4 Gy in 28 fractions  . Hot flashes   . Immature cataract   . Migraines   . Osteoarthritis    hands and feet  . Osteopenia 02/2016   T score -1.3 FRAX 8.3%/0.7% stable from prior DEXA  . Overactive bladder   . PMB (postmenopausal bleeding)   . Pneumonia    History of  . Seasonal allergies   . Thyroid nodule 2012   Solitary nodule in the lower pole of the left thyroid lobe , pt unaware  . Tingling of upper extremity   . Wears glasses     PAST SURGICAL HISTORY: Past Surgical History:    Procedure Laterality Date  . BREAST RECONSTRUCTION WITH PLACEMENT OF TISSUE EXPANDER AND FLEX HD (ACELLULAR HYDRATED DERMIS) Left 07/01/2016   Procedure: BREAST RECONSTRUCTION WITH PLACEMENT OF TISSUE EXPANDER AND  ALLODERM;  Surgeon: Irene Limbo, MD;  Location: [place];  Service: Plastics;  Laterality: Left;  . COLONOSCOPY  2011  . DILATATION & CURETTAGE/HYSTEROSCOPY WITH MYOSURE N/A 12/06/2018   Procedure: DILATATION & CURETTAGE/HYSTEROSCOPY WITH MYOSURE;  Surgeon: [name], MD;  Location: [facility];  Service: Gynecology;  Laterality: N/A;  follow 1st case  . INCISION AND DRAINAGE Left 06/02/2016   Procedure: DRAINAGE of left axilla seroma;  Surgeon: Sabrina Bookbinder, MD;  Location: [place];  Service: General;  Laterality: Left;  . LIPOSUCTION WITH LIPOFILLING Left 06/11/2018   Procedure: Lipofilling from abdomen to left chest;  Surgeon: Irene Limbo, MD;  Location: [place];  Service: Plastics;  Laterality: Left;  Marland Kitchen MASTOPEXY Right 04/17/2017   Procedure: RIGHT MASTOPEXY;  Surgeon: Irene Limbo, MD;  Location: [place];  Service: Plastics;  Laterality: Right;  . NIPPLE SPARING MASTECTOMY Left 07/01/2016   Procedure: LEFT NIPPLE SPARING MASTECTOMY;  Surgeon: Sabrina Bookbinder, MD;  Location: [place];  Service: General;  Laterality: Left;  . RADIOACTIVE SEED GUIDED PARTIAL MASTECTOMY WITH AXILLARY SENTINEL LYMPH NODE BIOPSY Right 03/26/2016   Procedure: RIGHT BREAST LUMPECTOMY WITH RADIOACTIVE SEED AND SENTINEL LYMPH NODE BIOPSY;  Surgeon: Sabrina Bookbinder, MD;  Location: [place];  Service: General;  Laterality: Right;  . RADIOACTIVE SEED GUIDED PARTIAL MASTECTOMY WITH AXILLARY SENTINEL LYMPH NODE BIOPSY Left 05/20/2016   Procedure: LEFT BREAST RADIOACTIVE SEED (two seeds) GUIDED LUMPECTOMY WITH LEFT AXILLARY SENTINEL LYMPH NODE BIOPSY AND LEFT  SEED TARGETED AXILLARY LYMPH NODE EXCISION;  Surgeon: Sabrina Bookbinder, MD;  Location: [place];  Service: General;  Laterality: Left;  . RE-EXCISION OF BREAST CANCER,SUPERIOR MARGINS Right 05/20/2016   Procedure: RE-EXCISION OF RIGHT BREAST MARGIN;  Surgeon: Sabrina Bookbinder, MD;  Location: [place];  Service: General;  Laterality: Right;  . RE-EXCISION OF BREAST CANCER,SUPERIOR MARGINS Left 06/02/2016   Procedure: RE-EXCISION OF BREAST CANCER,SUPERIOR MARGINS;  Surgeon: Sabrina Bookbinder, MD;  Location: [place];  Service: General;  Laterality: Left;  . REMOVAL OF TISSUE EXPANDER AND PLACEMENT OF IMPLANT Left 04/17/2017   Procedure: REMOVAL OF LEFT TISSUE EXPANDER WITH PLACEMENT OF LEFT SILICONE BREAST IMPLANT;  Surgeon: Irene Limbo, MD;  Location: [place];  Service: Plastics;  Laterality: Left;    FAMILY HISTORY Family History  Problem Relation Age of Onset  . Hypertension Mother   . Heart disease Mother   . Lung disease Mother   . Hypertension Father   . Cancer Father        colon  . Heart disease Father   . Lung cancer Father   . Anesthesia problems Father        hard to wake up post-op  . Breast cancer Paternal Grandmother        Age 57's  . Hypertension Brother   . Hyperlipidemia Brother   . Diabetes Maternal Grandfather    The patient's father died from lung cancer at the age of 71. He also had a remote history of colon cancer. His mother had a history of breast cancer in her 16s. The patient's mother died at age 44. The patient has one brother, no sisters.  GYNECOLOGIC HISTORY:  Patient's last menstrual period was 07/28/1996 (approximate). Menarche age 33, the patient is GX P0. 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  She lives alone, with no pets.   ADVANCED DIRECTIVES: In place. She has named her brother Marya Amsler as her healthcare part of attorney. He can be reached at [phone]/29/2006   HEALTH MAINTENANCE: Social History   Tobacco Use  . Smoking status: Former Smoker    Packs/day: 1.00    Years: 13.00    Pack years: 13.00    Types: Cigarettes    Quit date: 07/27/1990    Years since quitting: 29.3  . Smokeless tobacco: Never Used  Substance Use Topics  . Alcohol use: Yes    Comment: 2 beers daily  . Drug use: Not Currently    Types: Marijuana     Colonoscopy: February 2016  PAP:  Bone density: 04/22/2018; -1.9 osteopenia   Allergies  Allergen Reactions  . Hydrocodone Other (See Comments)    Restlessness, "energy"  . Hydrocodone-Acetaminophen Other (See Comments)  . Penicillins Itching  . Adhesive [  Tape] Other (See Comments)    SKIN IRRITATION  . Augmentin [Amoxicillin-Pot Clavulanate] Itching    Current Outpatient Medications  Medication Sig Dispense Refill  . acetaminophen (TYLENOL) 500 MG tablet Take 500 mg by mouth every 6 (six) hours as needed for headache.    . albuterol (PROAIR HFA) 108 (90 Base) MCG/ACT inhaler Inhale 2 puffs into the lungs every 4 (four) hours as needed for wheezing or shortness of breath. 1 Inhaler 1  . ANUCORT-HC 25 MG suppository     . Azelastine-Fluticasone (DYMISTA) 137-50 MCG/ACT SUSP 2 (two) times a day.     Marland Kitchen CALCIUM-MAGNESIUM PO Take by mouth.    . cetirizine (ZYRTEC) 10 MG tablet Take 10 mg by mouth every evening.     . Cholecalciferol (VITAMIN D) 2000 units tablet Take 4,000 Units by mouth daily.     . diclofenac sodium (VOLTAREN) 1 % GEL Apply 2 g topically 3 (three) times daily. 100 g 0  . docusate sodium (COLACE) 50 MG capsule Take 100 mg by mouth every evening.     Marland Kitchen FLOVENT HFA 110 MCG/ACT inhaler INHALE 1 PUFF BY MOUTH INTO THE LUNGS TWICE DAILY 12 g 0  . gabapentin (NEURONTIN) 300 MG capsule Take 1 capsule (300 mg total) by mouth at  bedtime. 90 capsule 4  . hydrocortisone (ANUSOL-HC) 2.5 % rectal cream Place 1 application rectally as needed for hemorrhoids or itching.    Marland Kitchen ibuprofen (ADVIL) 400 MG tablet Take 400 mg by mouth every 6 (six) hours as needed for mild pain.    Marland Kitchen omeprazole (PRILOSEC) 20 MG capsule     . OVER THE COUNTER MEDICATION Take by mouth as needed. FLORA RESTORE  When using antiobiotics    . OVER THE COUNTER MEDICATION Take by mouth daily. [place]    . PRILOSEC OTC 20 MG tablet every morning. Reported on 12/14/2015    . psyllium (METAMUCIL) 58.6 % packet Take 1 packet by mouth daily.    . tamoxifen (NOLVADEX) 10 MG tablet Take 1 tablet (10 mg total) by mouth daily. 90 tablet 4  . valACYclovir (VALTREX) 1000 MG tablet     . venlafaxine XR (EFFEXOR-XR) 75 MG 24 hr capsule TAKE 1 CAPSULE(75 MG) BY MOUTH DAILY WITH BREAKFAST 90 capsule 0   No current facility-administered medications for this visit.    OBJECTIVE:  white woman who appears younger than stated age  33:   11/24/19 1524  BP: (!) 143/85  Pulse: 88  Resp: 20  Temp: 98.5 F (36.9 C)  SpO2: 100%   Wt Readings from Last 3 Encounters:  11/24/19 166 lb (75.3 kg)  05/12/19 163 lb 8 oz (74.2 kg)  03/25/19 162 lb 6.4 oz (73.7 kg)   Body mass index is 28.72 kg/m.    ECOG FS:1 - Symptomatic but completely ambulatory  Sclerae unicteric, EOMs intact There is no significant finding in the inner lips or under the tongue by exam today No cervical or supraclavicular adenopathy Lungs no rales or rhonchi Heart regular rate and rhythm Abd soft, nontender, positive bowel sounds MSK no focal spinal tenderness, no upper extremity lymphedema Neuro: nonfocal, well oriented, appropriate affect Breasts: The right breast is status post reduction mammoplasty as well as lumpectomy and radiation.  There are no findings of concern.  The left breast is status post mastectomy with silicone implant reconstruction.  There is no evidence of disease  recurrence.  Both axillae are benign.   LAB RESULTS:  CMP     Component  Value Date/Time   NA 139 11/24/2019 1514   NA 136 03/12/2017 1115   K 3.8 11/24/2019 1514   K 4.0 03/12/2017 1115   CL 106 11/24/2019 1514   CO2 26 11/24/2019 1514   CO2 25 03/12/2017 1115   GLUCOSE 92 11/24/2019 1514   GLUCOSE 78 03/12/2017 1115   BUN 10 11/24/2019 1514   BUN 12.4 03/12/2017 1115   CREATININE 0.77 11/24/2019 1514   CREATININE 0.7 03/12/2017 1115   CALCIUM 8.9 11/24/2019 1514   CALCIUM 9.1 03/12/2017 1115   PROT 6.6 11/24/2019 1514   PROT 6.6 03/12/2017 1115   ALBUMIN 3.7 11/24/2019 1514   ALBUMIN 3.6 03/12/2017 1115   AST 24 11/24/2019 1514   AST 34 03/12/2017 1115   ALT 21 11/24/2019 1514   ALT 33 03/12/2017 1115   ALKPHOS 71 11/24/2019 1514   ALKPHOS 57 03/12/2017 1115   BILITOT 0.2 (L) 11/24/2019 1514   BILITOT 0.41 03/12/2017 1115   GFRNONAA >60 11/24/2019 1514   GFRAA >60 11/24/2019 1514    INo results found for: SPEP, UPEP  Lab Results  Component Value Date   WBC 5.7 11/24/2019   NEUTROABS 3.3 11/24/2019   HGB 13.5 11/24/2019   HCT 41.5 11/24/2019   MCV 95.6 11/24/2019   PLT 318 11/24/2019      Chemistry      Component Value Date/Time   NA 139 11/24/2019 1514   NA 136 03/12/2017 1115   K 3.8 11/24/2019 1514   K 4.0 03/12/2017 1115   CL 106 11/24/2019 1514   CO2 26 11/24/2019 1514   CO2 25 03/12/2017 1115   BUN 10 11/24/2019 1514   BUN 12.4 03/12/2017 1115   CREATININE 0.77 11/24/2019 1514   CREATININE 0.7 03/12/2017 1115      Component Value Date/Time   CALCIUM 8.9 11/24/2019 1514   CALCIUM 9.1 03/12/2017 1115   ALKPHOS 71 11/24/2019 1514   ALKPHOS 57 03/12/2017 1115   AST 24 11/24/2019 1514   AST 34 03/12/2017 1115   ALT 21 11/24/2019 1514   ALT 33 03/12/2017 1115   BILITOT 0.2 (L) 11/24/2019 1514   BILITOT 0.41 03/12/2017 1115       No results found for: LABCA2  No components found for: LABCA125  No results for input(s): INR in the last  168 hours.  Urinalysis    Component Value Date/Time   COLORURINE YELLOW 01/31/2016 1526   APPEARANCEUR CLEAR 01/31/2016 1526   LABSPEC 1.006 01/31/2016 1526   PHURINE 6.5 01/31/2016 1526   GLUCOSEU NEGATIVE 01/31/2016 1526   HGBUR NEGATIVE 01/31/2016 1526   BILIRUBINUR NEGATIVE 01/31/2016 1526   KETONESUR NEGATIVE 01/31/2016 1526   PROTEINUR NEGATIVE 01/31/2016 1526   UROBILINOGEN 0.2 12/11/2014 1506   NITRITE NEGATIVE 01/31/2016 1526   LEUKOCYTESUR TRACE (A) 01/31/2016 1526    STUDIES: No results found.   ELIGIBLE FOR AVAILABLE RESEARCH PROTOCOL: no   ASSESSMENT: 69 y.o. [place] woman status post right breast upper outer quadrant lumpectomy 03/26/2016 for a pT1c pN1, stage IIA invasive ductal carcinoma, grade 1, estrogen and progesterone receptor positive, HER-2 nonamplified, with an MIB-1 of 12%   (a) mammaprint from this tumor was read as "low risk".   (1) a second right breast cancer, invasive lobular, grade 1, measuring 0.6 cm, focally involved the final right medial margin from the 03/26/2016 procedure; this tumor also was estrogen and progesterone receptor positive, HER-2 negative, with an MIB-1 of 3%  (a) medial margin cleared with additional surgery 05/20/2016.  (  2) two biopsies from the left breast 04/09/2016 and 04/11/2016, 4.6 cm apart, showed  (a) centrally, an invasive ductal carcinoma, grade 1 or 2, with no prognostic panel available  (b) in the upper outer quadrant, invasive ductal carcinoma, grade 2, estrogen receptor 10% positive, progesterone receptor and HER-2 negative, with an MIB-1 of 2%  (c) left axillary lymph node biopsy 04/14/2016 showed invasive ductal carcinoma, estrogen and progesterone receptor negative  (d) mammaprint from (2)(c) was also "low risk"  (3) left lumpectomy 05/20/2016 showed a  pT1c pN1 stage IIA  invasive ductal carcinoma, grade 2, estrogen receptor positive, progesterone receptor negative, HER-2 nonamplified, with an MIB-1 of 2%;  margins were positive   (4) status post left nipple sparing mastectomy with immediate expander placement 07/01/2016 showing multiple foci of residual grade 2 invasive ductal carcinoma, the largest measuring 0.7 cm, with evidence of lymphovascular invasion and multiple close but negative margins  (a) silicone implant placement 04/21/2017  (5) radiation therapy 08/19/16-03/05/18with capecitabine sensitization Site/dose:   1) Right breast/ 50Gy in 25 fractions                         2) Right breast boost/ 10 Gy in 5 fractions                         3) Left breast 50.4 Gy in 28 fractions   (6) tamoxifen started 04/18/2016--to be continued a minimum of 5 years, likely followed by anastrozole x 2 years  (a) endometrial thickening and polyp noted March 2020-- tamoxifen discontinued April 2020  (b) anastrozole started 11/18/2018, stopped June 2020 due to side effects   (i) osteopenia, with T score -1.9 on bone density scan on 04/22/2018  (c) tamoxifen resumed June 2020   PLAN: Sabrina [facility]ia is now nearly 3-1/2 years out from definitive surgery for breast cancer with no evidence of disease recurrence.  This is very favorable.  She is tolerating tamoxifen well and the plan is to continue that a total of 10 years.  At some point we may consider switching to an aromatase inhibitor but she did tolerate anastrozole poorly.  I have encouraged her exercise program and reassured her that the discomfort she is feeling on the inside lips is going to be due to the mouthpiece for her nighttime CPAP.  She knows to call for any other issue that may develop before the next visit here which will be in 1 year  Total encounter time 25 minutes.*  Murrel Freet, Sabrina Dad, MD  11/24/19 5:45 PM Medical Oncology and Hematology [facility] [place], [place] [address] Tel. [phone]    Fax. [phone]   I, Wilburn Mylar, am acting as scribe for Dr. Virgie Tapia. Atianna Haidar.  I, Lurline Del MD, have reviewed the above documentation for accuracy and completeness, and I agree with the above.    *Total Encounter Time as defined by the Centers for Medicare and Medicaid Services includes, in addition to the face-to-face time of a patient visit (documented in the note above) non-face-to-face time: obtaining and reviewing outside history, ordering and reviewing medications, tests or procedures, care coordination (communications with other health care professionals or caregivers) and documentation in the medical record.

## 2019-11-24 ENCOUNTER — Inpatient Hospital Stay: Payer: Medicare PPO | Admitting: Oncology

## 2019-11-24 ENCOUNTER — Other Ambulatory Visit: Payer: Self-pay

## 2019-11-24 ENCOUNTER — Inpatient Hospital Stay: Payer: Medicare PPO | Attending: Oncology

## 2019-11-24 VITALS — BP 143/85 | HR 88 | Temp 98.5°F | Resp 20 | Ht 63.75 in | Wt 166.0 lb

## 2019-11-24 DIAGNOSIS — C50812 Malignant neoplasm of overlapping sites of left female breast: Secondary | ICD-10-CM | POA: Diagnosis not present

## 2019-11-24 DIAGNOSIS — Z170 Estrogen receptor positive status [ER+]: Secondary | ICD-10-CM | POA: Diagnosis not present

## 2019-11-24 DIAGNOSIS — C50411 Malignant neoplasm of upper-outer quadrant of right female breast: Secondary | ICD-10-CM | POA: Diagnosis not present

## 2019-11-24 DIAGNOSIS — Z79810 Long term (current) use of selective estrogen receptor modulators (SERMs): Secondary | ICD-10-CM | POA: Insufficient documentation

## 2019-11-24 DIAGNOSIS — C773 Secondary and unspecified malignant neoplasm of axilla and upper limb lymph nodes: Secondary | ICD-10-CM | POA: Diagnosis not present

## 2019-11-24 DIAGNOSIS — M8580 Other specified disorders of bone density and structure, unspecified site: Secondary | ICD-10-CM | POA: Insufficient documentation

## 2019-11-24 LAB — CBC WITH DIFFERENTIAL (CANCER CENTER ONLY)
Abs Immature Granulocytes: 0.01 10*3/uL (ref 0.00–0.07)
Basophils Absolute: 0 10*3/uL (ref 0.0–0.1)
Basophils Relative: 1 %
Eosinophils Absolute: 0.3 10*3/uL (ref 0.0–0.5)
Eosinophils Relative: 5 %
HCT: 41.5 % (ref 36.0–46.0)
Hemoglobin: 13.5 g/dL (ref 12.0–15.0)
Immature Granulocytes: 0 %
Lymphocytes Relative: 26 %
Lymphs Abs: 1.5 10*3/uL (ref 0.7–4.0)
MCH: 31.1 pg (ref 26.0–34.0)
MCHC: 32.5 g/dL (ref 30.0–36.0)
MCV: 95.6 fL (ref 80.0–100.0)
Monocytes Absolute: 0.6 10*3/uL (ref 0.1–1.0)
Monocytes Relative: 10 %
Neutro Abs: 3.3 10*3/uL (ref 1.7–7.7)
Neutrophils Relative %: 58 %
Platelet Count: 318 10*3/uL (ref 150–400)
RBC: 4.34 MIL/uL (ref 3.87–5.11)
RDW: 13.6 % (ref 11.5–15.5)
WBC Count: 5.7 10*3/uL (ref 4.0–10.5)
nRBC: 0 % (ref 0.0–0.2)

## 2019-11-24 LAB — CMP (CANCER CENTER ONLY)
ALT: 21 U/L (ref 0–44)
AST: 24 U/L (ref 15–41)
Albumin: 3.7 g/dL (ref 3.5–5.0)
Alkaline Phosphatase: 71 U/L (ref 38–126)
Anion gap: 7 (ref 5–15)
BUN: 10 mg/dL (ref 8–23)
CO2: 26 mmol/L (ref 22–32)
Calcium: 8.9 mg/dL (ref 8.9–10.3)
Chloride: 106 mmol/L (ref 98–111)
Creatinine: 0.77 mg/dL (ref 0.44–1.00)
GFR, Est AFR Am: >60 mL/min (ref >60)
GFR, Estimated: >60 mL/min (ref >60)
Glucose, Bld: 92 mg/dL (ref 70–99)
Potassium: 3.8 mmol/L (ref 3.5–5.1)
Sodium: 139 mmol/L (ref 135–145)
Total Bilirubin: 0.2 mg/dL — ABNORMAL LOW (ref 0.3–1.2)
Total Protein: 6.6 g/dL (ref 6.5–8.1)

## 2019-11-24 MED ORDER — TAMOXIFEN CITRATE 10 MG PO TABS: 10.0000 mg | ORAL_TABLET | Freq: Every day | ORAL | 4 refills | Status: DC

## 2019-11-24 MED ORDER — GABAPENTIN 300 MG PO CAPS: 300.0000 mg | ORAL_CAPSULE | Freq: Every day | ORAL | 4 refills | Status: DC

## 2019-11-28 ENCOUNTER — Telehealth: Payer: Self-pay | Admitting: Oncology

## 2019-11-28 NOTE — Telephone Encounter (Signed)
Made no changes to pt's schedule per 4/29 los.

## 2019-12-06 ENCOUNTER — Other Ambulatory Visit: Payer: Self-pay | Admitting: Oncology

## 2020-01-17 ENCOUNTER — Encounter: Payer: Self-pay | Admitting: Oncology

## 2020-01-18 ENCOUNTER — Other Ambulatory Visit: Payer: Self-pay | Admitting: *Deleted

## 2020-01-18 MED ORDER — TAMOXIFEN CITRATE 20 MG PO TABS
20.0000 mg | ORAL_TABLET | Freq: Every day | ORAL | 0 refills | Status: DC
Start: 2020-01-18 — End: 2020-02-16

## 2020-02-09 DIAGNOSIS — Z800 Family history of malignant neoplasm of digestive organs: Secondary | ICD-10-CM | POA: Diagnosis not present

## 2020-02-09 DIAGNOSIS — K644 Residual hemorrhoidal skin tags: Secondary | ICD-10-CM | POA: Diagnosis not present

## 2020-02-16 ENCOUNTER — Other Ambulatory Visit: Payer: Self-pay | Admitting: Oncology

## 2020-02-27 DIAGNOSIS — H524 Presbyopia: Secondary | ICD-10-CM | POA: Diagnosis not present

## 2020-02-27 DIAGNOSIS — H04123 Dry eye syndrome of bilateral lacrimal glands: Secondary | ICD-10-CM | POA: Diagnosis not present

## 2020-02-27 DIAGNOSIS — H25813 Combined forms of age-related cataract, bilateral: Secondary | ICD-10-CM | POA: Diagnosis not present

## 2020-02-27 DIAGNOSIS — H5212 Myopia, left eye: Secondary | ICD-10-CM | POA: Diagnosis not present

## 2020-03-05 ENCOUNTER — Encounter: Payer: Self-pay | Admitting: Podiatry

## 2020-03-05 ENCOUNTER — Other Ambulatory Visit: Payer: Self-pay

## 2020-03-05 ENCOUNTER — Ambulatory Visit: Payer: Medicare PPO | Admitting: Podiatry

## 2020-03-05 VITALS — Temp 98.9°F

## 2020-03-05 DIAGNOSIS — L600 Ingrowing nail: Secondary | ICD-10-CM

## 2020-03-07 NOTE — Progress Notes (Signed)
Subjective:   Patient ID: Sabrina [place]ia, female   DOB: 69 y.o.   MRN: [id number]   HPI Patient presents stating she has been getting an ingrown toenail on her right big toe and has to have pedicures every 3 weeks.  States it is very sore and at times it really bothers her at times not as bad.  Patient does not smoke likes to be active   Review of Systems  All other systems reviewed and are negative.       Objective:  Physical Exam Vitals and nursing note reviewed.  Constitutional:      Appearance: She is well-developed.  Pulmonary:     Effort: Pulmonary effort is normal.  Musculoskeletal:        General: Normal range of motion.  Skin:    General: Skin is warm.  Neurological:     Mental Status: She is alert.     Neurovascular status intact muscle strength adequate range of motion within normal limits.  Medial border right hallux is incurvated and tender in the distal corner.  It makes it hard to wear shoe gear at times but there is no active drainage or intense redness      Assessment:  Chronic ingrown toenail deformity right hallux medial border     Plan:  H&P reviewed condition and I do think long-term correction would be best.  I educated her on this and explained procedure and she will think about it and make a decision and today I just trimmed the corner out to try to take some pressure off of it

## 2020-05-08 ENCOUNTER — Encounter: Payer: Self-pay | Admitting: Oncology

## 2020-05-16 ENCOUNTER — Other Ambulatory Visit: Payer: Self-pay | Admitting: Oncology

## 2020-05-24 DIAGNOSIS — D485 Neoplasm of uncertain behavior of skin: Secondary | ICD-10-CM | POA: Diagnosis not present

## 2020-05-24 DIAGNOSIS — L820 Inflamed seborrheic keratosis: Secondary | ICD-10-CM | POA: Diagnosis not present

## 2020-05-24 DIAGNOSIS — L400 Psoriasis vulgaris: Secondary | ICD-10-CM | POA: Diagnosis not present

## 2020-06-03 NOTE — Progress Notes (Signed)
[place]  Telephone:([phone]) [phone] Fax:(336) [phone]    ID: [name] DOB: 12/15/1950  MR#: [id number]  WGY#:[id number]  Patient Care Team: Sabrina Huddle, MD as PCP - General (Internal Medicine) Sabrina Bookbinder, MD as Consulting Physician (General Surgery) Sabrina Tapia, Sabrina Dad, MD as Consulting Physician (Oncology) Sabrina Tapia, Sabrina Massed, NP as Nurse Practitioner (Hematology and Oncology) Sabrina Tapia, Sabrina All, MD as Consulting Physician (Obstetrics and Gynecology) OTHER MD:   CHIEF COMPLAINT: Estrogen receptor positive breast cancer (s/p left mastectomy)  CURRENT TREATMENT: Tamoxifen   INTERVAL HISTORY: Sabrina Tapia returns today for follow-up of her estrogen receptor positive breast cancer.   She continues on tamoxifen.  She is tolerating this with no hot flashes or vaginal issues that she is aware of.  Since she had her D&C she has had no further uterine concerns. Her last uterine sonohystogram was April 2020  Since was due for mammography at [facility] and did get a call but she was traveling so she has not had her mammography yet.  She also underwent colonoscopy on 02/09/2020 at [facility] under Dr. Christoper Tapia.  She tells me Dr. Christoper Tapia found no polyps (the procedure report is not accessible in epic).  REVIEW OF SYSTEMS: Sabrina Tapia is playing golf, and hitting her handicap.  She recently well and on a diet and started an exercise program walking 45 minutes a day.  She has had no changes in her chest that she has noticed.  Occasionally she feels a little itchy on the left side superiorly and laterally.  This is very intermittent.  It is not accompanied by erythema or any other changes.  She ran the [place] for breast cancer and raised more than $18,000.  A detailed review of systems today was otherwise stable   COVID 19 VACCINATION STATUS: fully vaccinated [employer]) including booster September 2021   BREAST CANCER HISTORY: From the earlier  summary note:  "Sabrina Tapia" had screening mammography at her gynecologist's office suggestive of a change in the upper-outer quadrant of the right breast. She was referred to [facility] where on 03/04/2016 she underwent bilateral diagnostic mammography and right breast ultrasonography. The breast density was category C. In the upper outer quadrant of the right breast there was a new area of architectural distortion. By ultrasonography this measured 0.5 cm. The right axilla was reported as sonographically benign.  On 03/06/2016 she underwent core needle biopsy of the right breast mass in question, and this showed ([id number]) an invasive ductal carcinoma, grade 1, estrogen receptor 95% positive, progesterone receptor 95% positive, both with strong staining intensity, with an MIB-1 of 12%, and no HER-2 amplification, the signals ratio being 1.54 and the number per cell 2.15.  Her case was presented in the multidisciplinary breast cancer conference 03/12/2016.Marland Kitchen At that time a preliminary plan was proposed: Breast conserving surgery with sentinel lymph node sampling, no Oncotype if the tumor was no larger than 5 mm, and consideration of genetics testing.  On 03/26/2016 Sabrina Tapia underwent right lumpectomy and sentinel lymph node sampling. This showed ([id number]) an invasive ductal carcinoma measuring 1.4 cm, grade 1, with negative margins, and a macrometastatic deposit of ductal carcinoma in the single sentinel lymph node. Mammaprint from this tumor was read as "low risk".  However there was a separate invasive lobular carcinoma measuring 0.6 cm,  focally involving the final right medial margin. Because of the lobular breast cancer bilateral breast MRI was obtained 04/04/2016. This showed in the right breast a 7.4 x 4.5 cm seroma  with 2 enhancing masses posterior to the lumpectomy cavity, the larger measuring 1.0 cm. These were felt possibly to be reactive lymph nodes. One of these lymph nodes was biopsied 04/09/2016  and this showed ([id number]) benign lymph node tissue. The MRI showed no evidence of additional disease in the right breast.  However in the left breast centrally there was a 1.3 cm enhancing mass. Biopsy of this left breast mass 04/09/2016 ([id number]) showed an invasive ductal carcinoma, grade 1 or 2, with insufficient tissue for a prognostic panel. On 04/11/2016 biopsy of a second left breast mass, upper outer quadrant, 4.6 cm a way from the other left breast mass, showed ([id number]) an invasive ductal carcinoma, grade 2, estrogen receptor 10% positive with strong staining intensity, progesterone receptor negative, with an MIB-1 of 2%, and no HER-2 amplification, the signals ratio being 1.50 and the number per cell 2.10. The proliferation fraction was 2%.  On 04/14/2016 Sabrina Tapia underwent biopsy of a suspicious left axillary lymph node and this ([id number]) was positive for invasive ductal carcinoma, estrogen and progesterone receptor negative, with an MIB-1 of 10%. I do not find that HER-2 was repeated. Mammaprint from this tumor also came back low risk.  Her subsequent history is as detailed below.   PAST MEDICAL HISTORY: Past Medical History:  Diagnosis Date  . Allergy-induced asthma    prn inhaler  . Asthma    allergy induced  . Basal cell carcinoma    right clavicle  . Bruises easily   . Constipation   . Dental crowns present   . Endometrial thickening on ultrasound   . Family history of adverse reaction to anesthesia    pt's father has hx. of being hard to wake up post-op  . GERD (gastroesophageal reflux disease)   . Hemorrhoids   . History of breast cancer 2017   bilateral  . History of radiation therapy    08/19/16- 09/29/16, Right Breast 50 Gy in 25 fractions, Right Breast boost 10 Gy in 5 fractions, Left Breast 50.4 Gy in 28 fractions  . Hot flashes   . Immature cataract   . Migraines   . Osteoarthritis    hands and feet  . Osteopenia 02/2016   T score -1.3 FRAX  8.3%/0.7% stable from prior DEXA  . Overactive bladder   . PMB (postmenopausal bleeding)   . Pneumonia    History of  . Seasonal allergies   . Thyroid nodule 2012   Solitary nodule in the lower pole of the left thyroid lobe , pt unaware  . Tingling of upper extremity   . Wears glasses     PAST SURGICAL HISTORY: Past Surgical History:  Procedure Laterality Date  . BREAST RECONSTRUCTION WITH PLACEMENT OF TISSUE EXPANDER AND FLEX HD (ACELLULAR HYDRATED DERMIS) Left 07/01/2016   Procedure: BREAST RECONSTRUCTION WITH PLACEMENT OF TISSUE EXPANDER AND  ALLODERM;  Surgeon: Irene Limbo, MD;  Location: [place];  Service: Plastics;  Laterality: Left;  . COLONOSCOPY  2011  . DILATATION & CURETTAGE/HYSTEROSCOPY WITH MYOSURE N/A 12/06/2018   Procedure: DILATATION & CURETTAGE/HYSTEROSCOPY WITH MYOSURE;  Surgeon: [name], MD;  Location: [facility];  Service: Gynecology;  Laterality: N/A;  follow 1st case  . INCISION AND DRAINAGE Left 06/02/2016   Procedure: DRAINAGE of left axilla seroma;  Surgeon: Sabrina Bookbinder, MD;  Location: [place];  Service: General;  Laterality: Left;  . LIPOSUCTION WITH LIPOFILLING Left 06/11/2018   Procedure: Lipofilling from  abdomen to left chest;  Surgeon: Irene Limbo, MD;  Location: [place];  Service: Plastics;  Laterality: Left;  Marland Kitchen MASTOPEXY Right 04/17/2017   Procedure: RIGHT MASTOPEXY;  Surgeon: Irene Limbo, MD;  Location: [place];  Service: Plastics;  Laterality: Right;  . NIPPLE SPARING MASTECTOMY Left 07/01/2016   Procedure: LEFT NIPPLE SPARING MASTECTOMY;  Surgeon: Sabrina Bookbinder, MD;  Location: [place];  Service: General;  Laterality: Left;  . RADIOACTIVE SEED GUIDED PARTIAL MASTECTOMY WITH AXILLARY SENTINEL LYMPH NODE BIOPSY Right 03/26/2016   Procedure: RIGHT BREAST LUMPECTOMY WITH RADIOACTIVE SEED AND SENTINEL LYMPH NODE  BIOPSY;  Surgeon: Sabrina Bookbinder, MD;  Location: [place];  Service: General;  Laterality: Right;  . RADIOACTIVE SEED GUIDED PARTIAL MASTECTOMY WITH AXILLARY SENTINEL LYMPH NODE BIOPSY Left 05/20/2016   Procedure: LEFT BREAST RADIOACTIVE SEED (two seeds) GUIDED LUMPECTOMY WITH LEFT AXILLARY SENTINEL LYMPH NODE BIOPSY AND LEFT SEED TARGETED AXILLARY LYMPH NODE EXCISION;  Surgeon: Sabrina Bookbinder, MD;  Location: [place];  Service: General;  Laterality: Left;  . RE-EXCISION OF BREAST CANCER,SUPERIOR MARGINS Right 05/20/2016   Procedure: RE-EXCISION OF RIGHT BREAST MARGIN;  Surgeon: Sabrina Bookbinder, MD;  Location: [place];  Service: General;  Laterality: Right;  . RE-EXCISION OF BREAST CANCER,SUPERIOR MARGINS Left 06/02/2016   Procedure: RE-EXCISION OF BREAST CANCER,SUPERIOR MARGINS;  Surgeon: Sabrina Bookbinder, MD;  Location: [place];  Service: General;  Laterality: Left;  . REMOVAL OF TISSUE EXPANDER AND PLACEMENT OF IMPLANT Left 04/17/2017   Procedure: REMOVAL OF LEFT TISSUE EXPANDER WITH PLACEMENT OF LEFT SILICONE BREAST IMPLANT;  Surgeon: Irene Limbo, MD;  Location: [place];  Service: Plastics;  Laterality: Left;    FAMILY HISTORY Family History  Problem Relation Age of Onset  . Hypertension Mother   . Heart disease Mother   . Lung disease Mother   . Hypertension Father   . Cancer Father        colon  . Heart disease Father   . Lung cancer Father   . Anesthesia problems Father        hard to wake up post-op  . Breast cancer Paternal Grandmother        Age 78's  . Hypertension Brother   . Hyperlipidemia Brother   . Diabetes Maternal Grandfather    The patient's father died from lung cancer at the age of 40. He also had a remote history of colon cancer. His mother had a history of breast cancer in her 50s. The patient's mother died at age 68. The patient has one brother, no  sisters.  GYNECOLOGIC HISTORY:  Patient's last menstrual period was 07/28/1996 (approximate). Menarche age 42, the patient is GX P0. 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 She lives alone, with no pets.   ADVANCED DIRECTIVES: In place. She has named her brother Marya Amsler as her healthcare power of attorney. He can be reached at [phone]   HEALTH MAINTENANCE: Social History   Tobacco Use  . Smoking status: Former Smoker    Packs/day: 1.00    Years: 13.00    Pack years: 13.00    Types: Cigarettes    Quit date: 07/27/1990    Years since quitting: 29.8  . Smokeless tobacco: Never Used  Vaping Use  .  Vaping Use: Never used  Substance Use Topics  . Alcohol use: Yes    Comment: 2 beers daily  . Drug use: Not Currently    Types: Marijuana     Colonoscopy: 01/2020 (Dr. Christoper Tapia at [facility])  PAP:  Bone density: 04/22/2018; -1.9 osteopenia   Allergies  Allergen Reactions  . Bacitracin Swelling  . Hydrocodone Other (See Comments)    Restlessness, "energy"  . Hydrocodone-Acetaminophen Other (See Comments)  . Other     Mold   . Penicillins Itching  . Adhesive [Tape] Other (See Comments)    SKIN IRRITATION  . Augmentin [Amoxicillin-Pot Clavulanate] Itching  . Penicillamine Rash    Current Outpatient Medications  Medication Sig Dispense Refill  . acetaminophen (TYLENOL) 500 MG tablet Take 500 mg by mouth every 6 (six) hours as needed for headache.    . albuterol (PROAIR HFA) 108 (90 Base) MCG/ACT inhaler Inhale 2 puffs into the lungs every 4 (four) hours as needed for wheezing or shortness of breath. 1 Inhaler 1  . azelastine (ASTELIN) 0.1 % nasal spray     . Azelastine-Fluticasone (DYMISTA) 137-50 MCG/ACT SUSP 2 (two) times a day.     . Calcium Citrate-Vitamin D (CALCIUM CITRATE + D3) 200-250  MG-UNIT TABS calcium 250 mg-D3 400 unit-magnesium 40 mg-B6-Zn-copper-mangan tablet  Take by oral route.    Marland Kitchen CALCIUM-MAGNESIUM PO Take by mouth.    . cetirizine (ZYRTEC) 10 MG tablet Take 10 mg by mouth every evening.     . Cholecalciferol (VITAMIN D) 2000 units tablet Take 5,000 Units by mouth daily.     Marland Kitchen docusate sodium (COLACE) 50 MG capsule Take 100 mg by mouth every evening.     Marland Kitchen FLOVENT HFA 110 MCG/ACT inhaler INHALE 1 PUFF BY MOUTH INTO THE LUNGS TWICE DAILY 12 g 0  . hydrocortisone (ANUSOL-HC) 2.5 % rectal cream Place 1 application rectally as needed for hemorrhoids or itching.    Marland Kitchen ibuprofen (ADVIL) 400 MG tablet Take 400 mg by mouth every 6 (six) hours as needed for mild pain.    Marland Kitchen OVER THE COUNTER MEDICATION Take by mouth daily. [place]    . PRILOSEC OTC 20 MG tablet every morning. Reported on 12/14/2015    . psyllium (METAMUCIL) 58.6 % packet Take 1 packet by mouth daily.    . tamoxifen (NOLVADEX) 20 MG tablet TAKE 1 TABLET(20 MG) BY MOUTH DAILY 90 tablet 4  . valACYclovir (VALTREX) 1000 MG tablet     . venlafaxine XR (EFFEXOR-XR) 75 MG 24 hr capsule TAKE 1 CAPSULE(75 MG) BY MOUTH DAILY WITH BREAKFAST 90 capsule 4   No current facility-administered medications for this visit.    OBJECTIVE:  white woman who appears younger than stated age  41:   06/04/20 0854  BP: 130/79  Pulse: 74  Resp: 18  Temp: 98.8 F (37.1 C)  SpO2: 97%   Wt Readings from Last 3 Encounters:  06/04/20 159 lb 4.8 oz (72.3 kg)  11/24/19 166 lb (75.3 kg)  05/12/19 163 lb 8 oz (74.2 kg)   Body mass index is 27.56 kg/m.    ECOG FS:1 - Symptomatic but completely ambulatory  Sclerae unicteric, EOMs intact Wearing a mask No cervical or supraclavicular adenopathy Lungs no rales or rhonchi Heart regular rate and rhythm Abd soft, nontender, positive bowel sounds MSK no focal spinal tenderness, no upper extremity lymphedema Neuro: nonfocal, well oriented, appropriate affect Breasts:  The right breast is status post lumpectomy radiation and reduction mammoplasty.  There are no findings  of concern.  The left breast is status post mastectomy with silicone implant reconstruction.  There is no evidence of disease recurrence.  Both axillae are benign.    LAB RESULTS:  CMP     Component Value Date/Time   NA 139 11/24/2019 1514   NA 136 03/12/2017 1115   K 3.8 11/24/2019 1514   K 4.0 03/12/2017 1115   CL 106 11/24/2019 1514   CO2 26 11/24/2019 1514   CO2 25 03/12/2017 1115   GLUCOSE 92 11/24/2019 1514   GLUCOSE 78 03/12/2017 1115   BUN 10 11/24/2019 1514   BUN 12.4 03/12/2017 1115   CREATININE 0.77 11/24/2019 1514   CREATININE 0.7 03/12/2017 1115   CALCIUM 8.9 11/24/2019 1514   CALCIUM 9.1 03/12/2017 1115   PROT 6.6 11/24/2019 1514   PROT 6.6 03/12/2017 1115   ALBUMIN 3.7 11/24/2019 1514   ALBUMIN 3.6 03/12/2017 1115   AST 24 11/24/2019 1514   AST 34 03/12/2017 1115   ALT 21 11/24/2019 1514   ALT 33 03/12/2017 1115   ALKPHOS 71 11/24/2019 1514   ALKPHOS 57 03/12/2017 1115   BILITOT 0.2 (L) 11/24/2019 1514   BILITOT 0.41 03/12/2017 1115   GFRNONAA >60 11/24/2019 1514   GFRAA >60 11/24/2019 1514    INo results found for: SPEP, UPEP  Lab Results  Component Value Date   WBC 4.1 06/04/2020   NEUTROABS 2.4 06/04/2020   HGB 14.2 06/04/2020   HCT 42.5 06/04/2020   MCV 93.0 06/04/2020   PLT 329 06/04/2020      Chemistry      Component Value Date/Time   NA 139 11/24/2019 1514   NA 136 03/12/2017 1115   K 3.8 11/24/2019 1514   K 4.0 03/12/2017 1115   CL 106 11/24/2019 1514   CO2 26 11/24/2019 1514   CO2 25 03/12/2017 1115   BUN 10 11/24/2019 1514   BUN 12.4 03/12/2017 1115   CREATININE 0.77 11/24/2019 1514   CREATININE 0.7 03/12/2017 1115      Component Value Date/Time   CALCIUM 8.9 11/24/2019 1514   CALCIUM 9.1 03/12/2017 1115   ALKPHOS 71 11/24/2019 1514   ALKPHOS 57 03/12/2017 1115   AST 24 11/24/2019 1514   AST 34 03/12/2017 1115   ALT  21 11/24/2019 1514   ALT 33 03/12/2017 1115   BILITOT 0.2 (L) 11/24/2019 1514   BILITOT 0.41 03/12/2017 1115       No results found for: LABCA2  No components found for: LABCA125  No results for input(s): INR in the last 168 hours.  Urinalysis    Component Value Date/Time   COLORURINE YELLOW 01/31/2016 1526   APPEARANCEUR CLEAR 01/31/2016 1526   LABSPEC 1.006 01/31/2016 1526   PHURINE 6.5 01/31/2016 1526   GLUCOSEU NEGATIVE 01/31/2016 1526   HGBUR NEGATIVE 01/31/2016 1526   BILIRUBINUR NEGATIVE 01/31/2016 1526   KETONESUR NEGATIVE 01/31/2016 1526   PROTEINUR NEGATIVE 01/31/2016 1526   UROBILINOGEN 0.2 12/11/2014 1506   NITRITE NEGATIVE 01/31/2016 1526   LEUKOCYTESUR TRACE (A) 01/31/2016 1526    STUDIES: No results found.   ELIGIBLE FOR AVAILABLE RESEARCH PROTOCOL: no   ASSESSMENT: 69 y.o. [place] woman status post right breast upper outer quadrant lumpectomy 03/26/2016 for a pT1c pN1, stage IIA invasive ductal carcinoma, grade 1, estrogen and progesterone receptor positive, HER-2 nonamplified, with an MIB-1 of 12%   (a) mammaprint from this tumor was read as "low risk".   (1) a second right breast cancer, invasive lobular, grade 1, measuring  0.6 cm, focally involved the final right medial margin from the 03/26/2016 procedure; this tumor also was estrogen and progesterone receptor positive, HER-2 negative, with an MIB-1 of 3%  (a) medial margin cleared with additional surgery 05/20/2016.  (2) two biopsies from the left breast 04/09/2016 and 04/11/2016, 4.6 cm apart, showed  (a) centrally, an invasive ductal carcinoma, grade 1 or 2, with no prognostic panel available  (b) in the upper outer quadrant, invasive ductal carcinoma, grade 2, estrogen receptor 10% positive, progesterone receptor and HER-2 negative, with an MIB-1 of 2%  (c) left axillary lymph node biopsy 04/14/2016 showed invasive ductal carcinoma, estrogen and progesterone receptor negative  (d) mammaprint  from (2)(c) was also "low risk"  (3) left lumpectomy 05/20/2016 showed a  pT1c pN1 stage IIA  invasive ductal carcinoma, grade 2, estrogen receptor positive, progesterone receptor negative, HER-2 nonamplified, with an MIB-1 of 2%; margins were positive   (4) status post left nipple sparing mastectomy with immediate expander placement 07/01/2016 showing multiple foci of residual grade 2 invasive ductal carcinoma, the largest measuring 0.7 cm, with evidence of lymphovascular invasion and multiple close but negative margins   (a) silicone implant placement 04/21/2017  (5) radiation therapy 08/19/16-03/05/18with capecitabine sensitization Site/dose:   1) Right breast/ 50Gy in 25 fractions                         2) Right breast boost/ 10 Gy in 5 fractions                         3) Left breast 50.4 Gy in 28 fractions   (6) tamoxifen started 04/18/2016--to be continued a minimum of 5 years, likely followed by anastrozole x 2 years  (a) endometrial thickening and polyp noted March 2020-- tamoxifen discontinued April 2020  (b) anastrozole started 11/18/2018, stopped June 2020 due to side effects   (i) osteopenia, with T score -1.9 on bone density scan on 04/22/2018  (c) tamoxifen resumed June 2020   PLAN: Sabrina Tapia is now just about 4 years out from definitive surgery for her breast cancer with no evidence of disease recurrence.  This is very favorable.  She is tolerating tamoxifen well.  She may wish to continue it for a total of 10 years or at some point she may wish to switch to an aromatase inhibitor.  However she did not tolerate anastrozole well in the past.  She is just a little behind in her mammography and I have reentered the order for her.  She will follow up with Dr. Cathie Olden her gynecologist before the end of the year and she will see Dr. Donne Hazel in about 62month.  Accordingly she will return to see me in a year.  She knows to call for any other issue that may develop before then  Total  encounter time 20 minutes.*    Meredeth Furber, GVirgie Dad MD  06/04/20 9:20 AM Medical On[facility] and H[facility] [address]Tel. [phone]   Fax. [phone]  I, KWilburn Mylar am acting as scribe for Dr. GVirgie Tapia Shaquilla Kehres.  I, GLurline DelMD, have reviewed the above documentation for accuracy and completeness, and I agree with the above.   *Total Encounter Time as defined by the Centers for Medicare and Medicaid Services includes, in addition to the face-to-face time of a patient visit (documented in the note above) non-face-to-face time: obtaining and reviewing  outside history, ordering and reviewing medications, tests or procedures, care coordination (communications with other health care professionals or caregivers) and documentation in the medical record.

## 2020-06-04 ENCOUNTER — Inpatient Hospital Stay: Payer: Medicare PPO | Attending: Oncology | Admitting: Oncology

## 2020-06-04 ENCOUNTER — Inpatient Hospital Stay: Payer: Medicare PPO

## 2020-06-04 ENCOUNTER — Other Ambulatory Visit: Payer: Self-pay

## 2020-06-04 VITALS — BP 130/79 | HR 74 | Temp 98.8°F | Resp 18 | Ht 63.75 in | Wt 159.3 lb

## 2020-06-04 DIAGNOSIS — C50812 Malignant neoplasm of overlapping sites of left female breast: Secondary | ICD-10-CM

## 2020-06-04 DIAGNOSIS — K5900 Constipation, unspecified: Secondary | ICD-10-CM | POA: Diagnosis not present

## 2020-06-04 DIAGNOSIS — Z923 Personal history of irradiation: Secondary | ICD-10-CM | POA: Insufficient documentation

## 2020-06-04 DIAGNOSIS — M8580 Other specified disorders of bone density and structure, unspecified site: Secondary | ICD-10-CM | POA: Insufficient documentation

## 2020-06-04 DIAGNOSIS — M1990 Unspecified osteoarthritis, unspecified site: Secondary | ICD-10-CM | POA: Diagnosis not present

## 2020-06-04 DIAGNOSIS — J45909 Unspecified asthma, uncomplicated: Secondary | ICD-10-CM | POA: Diagnosis not present

## 2020-06-04 DIAGNOSIS — Z79899 Other long term (current) drug therapy: Secondary | ICD-10-CM | POA: Insufficient documentation

## 2020-06-04 DIAGNOSIS — Z79810 Long term (current) use of selective estrogen receptor modulators (SERMs): Secondary | ICD-10-CM | POA: Diagnosis not present

## 2020-06-04 DIAGNOSIS — Z801 Family history of malignant neoplasm of trachea, bronchus and lung: Secondary | ICD-10-CM | POA: Insufficient documentation

## 2020-06-04 DIAGNOSIS — R232 Flushing: Secondary | ICD-10-CM | POA: Diagnosis not present

## 2020-06-04 DIAGNOSIS — Z800 Family history of malignant neoplasm of digestive organs: Secondary | ICD-10-CM | POA: Diagnosis not present

## 2020-06-04 DIAGNOSIS — C50411 Malignant neoplasm of upper-outer quadrant of right female breast: Secondary | ICD-10-CM | POA: Insufficient documentation

## 2020-06-04 DIAGNOSIS — Z85828 Personal history of other malignant neoplasm of skin: Secondary | ICD-10-CM | POA: Diagnosis not present

## 2020-06-04 DIAGNOSIS — E041 Nontoxic single thyroid nodule: Secondary | ICD-10-CM | POA: Diagnosis not present

## 2020-06-04 DIAGNOSIS — Z170 Estrogen receptor positive status [ER+]: Secondary | ICD-10-CM

## 2020-06-04 DIAGNOSIS — Z87891 Personal history of nicotine dependence: Secondary | ICD-10-CM | POA: Diagnosis not present

## 2020-06-04 DIAGNOSIS — K219 Gastro-esophageal reflux disease without esophagitis: Secondary | ICD-10-CM | POA: Diagnosis not present

## 2020-06-04 DIAGNOSIS — Z803 Family history of malignant neoplasm of breast: Secondary | ICD-10-CM | POA: Diagnosis not present

## 2020-06-04 LAB — CBC WITH DIFFERENTIAL/PLATELET
Abs Immature Granulocytes: 0.01 10*3/uL (ref 0.00–0.07)
Basophils Absolute: 0 10*3/uL (ref 0.0–0.1)
Basophils Relative: 1 %
Eosinophils Absolute: 0.2 10*3/uL (ref 0.0–0.5)
Eosinophils Relative: 5 %
HCT: 42.5 % (ref 36.0–46.0)
Hemoglobin: 14.2 g/dL (ref 12.0–15.0)
Immature Granulocytes: 0 %
Lymphocytes Relative: 24 %
Lymphs Abs: 1 10*3/uL (ref 0.7–4.0)
MCH: 31.1 pg (ref 26.0–34.0)
MCHC: 33.4 g/dL (ref 30.0–36.0)
MCV: 93 fL (ref 80.0–100.0)
Monocytes Absolute: 0.5 10*3/uL (ref 0.1–1.0)
Monocytes Relative: 11 %
Neutro Abs: 2.4 10*3/uL (ref 1.7–7.7)
Neutrophils Relative %: 59 %
Platelets: 329 10*3/uL (ref 150–400)
RBC: 4.57 MIL/uL (ref 3.87–5.11)
RDW: 12.8 % (ref 11.5–15.5)
WBC: 4.1 10*3/uL (ref 4.0–10.5)
nRBC: 0 % (ref 0.0–0.2)

## 2020-06-04 LAB — COMPREHENSIVE METABOLIC PANEL
ALT: 37 U/L (ref 0–44)
AST: 32 U/L (ref 15–41)
Albumin: 3.7 g/dL (ref 3.5–5.0)
Alkaline Phosphatase: 56 U/L (ref 38–126)
Anion gap: 7 (ref 5–15)
BUN: 15 mg/dL (ref 8–23)
CO2: 23 mmol/L (ref 22–32)
Calcium: 9.4 mg/dL (ref 8.9–10.3)
Chloride: 104 mmol/L (ref 98–111)
Creatinine, Ser: 0.75 mg/dL (ref 0.44–1.00)
GFR, Estimated: >60 mL/min (ref >60)
Glucose, Bld: 71 mg/dL (ref 70–99)
Potassium: 4.2 mmol/L (ref 3.5–5.1)
Sodium: 134 mmol/L — ABNORMAL LOW (ref 135–145)
Total Bilirubin: 0.5 mg/dL (ref 0.3–1.2)
Total Protein: 6.6 g/dL (ref 6.5–8.1)

## 2020-06-04 MED ORDER — TAMOXIFEN CITRATE 20 MG PO TABS: ORAL_TABLET | ORAL | 4 refills | Status: DC

## 2020-06-04 MED ORDER — VENLAFAXINE HCL ER 75 MG PO CP24: ORAL_CAPSULE | ORAL | 4 refills | Status: DC

## 2020-06-06 ENCOUNTER — Telehealth: Payer: Self-pay | Admitting: Oncology

## 2020-06-06 NOTE — Telephone Encounter (Signed)
Scheduled appointment per 11/8 los. Spoke to patient who is aware of appointment date and time.

## 2020-06-19 ENCOUNTER — Telehealth: Payer: Self-pay | Admitting: Obstetrics and Gynecology

## 2020-06-19 ENCOUNTER — Ambulatory Visit (INDEPENDENT_AMBULATORY_CARE_PROVIDER_SITE_OTHER): Payer: Medicare PPO | Admitting: Obstetrics and Gynecology

## 2020-06-19 ENCOUNTER — Other Ambulatory Visit: Payer: Self-pay

## 2020-06-19 ENCOUNTER — Encounter: Payer: Self-pay | Admitting: Obstetrics and Gynecology

## 2020-06-19 VITALS — BP 120/72 | HR 72 | Resp 16 | Ht 63.75 in | Wt 157.0 lb

## 2020-06-19 DIAGNOSIS — R32 Unspecified urinary incontinence: Secondary | ICD-10-CM | POA: Diagnosis not present

## 2020-06-19 DIAGNOSIS — N3281 Overactive bladder: Secondary | ICD-10-CM

## 2020-06-19 DIAGNOSIS — Z01419 Encounter for gynecological examination (general) (routine) without abnormal findings: Secondary | ICD-10-CM | POA: Diagnosis not present

## 2020-06-19 LAB — POCT URINALYSIS DIPSTICK
Bilirubin, UA: NEGATIVE
Blood, UA: NEGATIVE
Glucose, UA: NEGATIVE
Ketones, UA: NEGATIVE
Leukocytes, UA: NEGATIVE
Nitrite, UA: NEGATIVE
Protein, UA: NEGATIVE
Spec Grav, UA: 1.015 (ref 1.010–1.025)
Urobilinogen, UA: 0.2 E.U./dL
pH, UA: 5 (ref 5.0–8.0)

## 2020-06-19 NOTE — Patient Instructions (Signed)

## 2020-06-19 NOTE — Progress Notes (Addendum)
69 y.o. G0P0 Single Caucasian female here for annual exam. Patient states that she has had urgency and incontinence "for most of this year" that has increased in the last couple of months.   Denies dysuria.  Declines medication.  Does control bladder irritants.  Denies vaginal bleeding.   Having left ankle problems.  No known injury.  Sees a foot specialist if needed.   Urine dip:  Negative. PCP: Josetta Huddle, MD    Patient's last menstrual period was 07/28/1996 (approximate).           Sexually active: No.  The current method of family planning is post menopausal status.    Exercising: No.  Exercise is limited by ankle pain. Smoker:  no  Health Maintenance: Pap:  03/25/19 Neg  01/30/17 Neg:Neg HR HPV History of abnormal Pap:  Yes, per patient MMG:  05/11/19 BIRADS 2 benign/density b -- scheduled 06/26/20 Colonoscopy:  Early summer 2021 - normal per patient BMD:   04/22/18  Result  Osteopenia TDaP:  PCP Gardasil:   n/a HIV: 12/10/16 Neg Hep C: 12/10/16 Neg Screening Labs:  PCP   reports that she quit smoking about 29 years ago. Her smoking use included cigarettes. She has a 13.00 pack-year smoking history. She has never used smokeless tobacco. She reports current alcohol use of about 2.0 standard drinks of alcohol per week. She reports previous drug use. Drug: Marijuana.  Past Medical History:  Diagnosis Date  . Allergy-induced asthma    prn inhaler  . Asthma    allergy induced  . Basal cell carcinoma    right clavicle  . Bruises easily   . Constipation   . Dental crowns present   . Endometrial thickening on ultrasound   . Family history of adverse reaction to anesthesia    pt's father has hx. of being hard to wake up post-op  . GERD (gastroesophageal reflux disease)   . Hemorrhoids   . History of breast cancer 2017   bilateral  . History of radiation therapy    08/19/16- 09/29/16, Right Breast 50 Gy in 25 fractions, Right Breast boost 10 Gy in 5 fractions, Left Breast  50.4 Gy in 28 fractions  . Hot flashes   . Immature cataract   . Migraines   . Osteoarthritis    hands and feet  . Osteopenia 02/2016   T score -1.3 FRAX 8.3%/0.7% stable from prior DEXA  . Overactive bladder   . PMB (postmenopausal bleeding)   . Pneumonia    History of  . Seasonal allergies   . Thyroid nodule 2012   Solitary nodule in the lower pole of the left thyroid lobe , pt unaware  . Tingling of upper extremity   . Wears glasses     Past Surgical History:  Procedure Laterality Date  . BREAST RECONSTRUCTION WITH PLACEMENT OF TISSUE EXPANDER AND FLEX HD (ACELLULAR HYDRATED DERMIS) Left 07/01/2016   Procedure: BREAST RECONSTRUCTION WITH PLACEMENT OF TISSUE EXPANDER AND  ALLODERM;  Surgeon: Irene Limbo, MD;  Location: [facility];  Service: Plastics;  Laterality: Left;  . BROW LIFT    . COLONOSCOPY  2011  . DILATATION & CURETTAGE/HYSTEROSCOPY WITH MYOSURE N/A 12/06/2018   Procedure: DILATATION & CURETTAGE/HYSTEROSCOPY WITH MYOSURE;  Surgeon: [name], MD;  Location: [facility];  Service: Gynecology;  Laterality: N/A;  follow 1st case  . INCISION AND DRAINAGE Left 06/02/2016   Procedure: DRAINAGE of left axilla seroma;  Surgeon: Rolm Bookbinder, MD;  Location:  [place];  Service: General;  Laterality: Left;  . LIPOSUCTION WITH LIPOFILLING Left 06/11/2018   Procedure: Lipofilling from abdomen to left chest;  Surgeon: Irene Limbo, MD;  Location: [facility];  Service: Plastics;  Laterality: Left;  Marland Kitchen MASTOPEXY Right 04/17/2017   Procedure: RIGHT MASTOPEXY;  Surgeon: Irene Limbo, MD;  Location: [place];  Service: Plastics;  Laterality: Right;  . NIPPLE SPARING MASTECTOMY Left 07/01/2016   Procedure: LEFT NIPPLE SPARING MASTECTOMY;  Surgeon: Rolm Bookbinder, MD;  Location: [place];  Service: General;  Laterality: Left;  . RADIOACTIVE SEED GUIDED PARTIAL  MASTECTOMY WITH AXILLARY SENTINEL LYMPH NODE BIOPSY Right 03/26/2016   Procedure: RIGHT BREAST LUMPECTOMY WITH RADIOACTIVE SEED AND SENTINEL LYMPH NODE BIOPSY;  Surgeon: Rolm Bookbinder, MD;  Location: [facility];  Service: General;  Laterality: Right;  . RADIOACTIVE SEED GUIDED PARTIAL MASTECTOMY WITH AXILLARY SENTINEL LYMPH NODE BIOPSY Left 05/20/2016   Procedure: LEFT BREAST RADIOACTIVE SEED (two seeds) GUIDED LUMPECTOMY WITH LEFT AXILLARY SENTINEL LYMPH NODE BIOPSY AND LEFT SEED TARGETED AXILLARY LYMPH NODE EXCISION;  Surgeon: Rolm Bookbinder, MD;  Location: [place];  Service: General;  Laterality: Left;  . RE-EXCISION OF BREAST CANCER,SUPERIOR MARGINS Right 05/20/2016   Procedure: RE-EXCISION OF RIGHT BREAST MARGIN;  Surgeon: Rolm Bookbinder, MD;  Location: [facility];  Service: General;  Laterality: Right;  . RE-EXCISION OF BREAST CANCER,SUPERIOR MARGINS Left 06/02/2016   Procedure: RE-EXCISION OF BREAST CANCER,SUPERIOR MARGINS;  Surgeon: Rolm Bookbinder, MD;  Location: [place];  Service: General;  Laterality: Left;  . REMOVAL OF TISSUE EXPANDER AND PLACEMENT OF IMPLANT Left 04/17/2017   Procedure: REMOVAL OF LEFT TISSUE EXPANDER WITH PLACEMENT OF LEFT SILICONE BREAST IMPLANT;  Surgeon: Irene Limbo, MD;  Location: [place];  Service: Plastics;  Laterality: Left;    Current Outpatient Medications  Medication Sig Dispense Refill  . acetaminophen (TYLENOL) 500 MG tablet Take 500 mg by mouth every 6 (six) hours as needed for headache.    . albuterol (PROAIR HFA) 108 (90 Base) MCG/ACT inhaler Inhale 2 puffs into the lungs every 4 (four) hours as needed for wheezing or shortness of breath. 1 Inhaler 1  . azelastine (ASTELIN) 0.1 % nasal spray     . CALCIUM-MAGNESIUM PO Take by mouth.    . cetirizine (ZYRTEC) 10 MG tablet Take 10 mg by mouth every evening.     . Cholecalciferol (VITAMIN D) 2000 units  tablet Take 5,000 Units by mouth daily.     Marland Kitchen docusate sodium (COLACE) 50 MG capsule Take 100 mg by mouth every evening.     Marland Kitchen FLOVENT HFA 110 MCG/ACT inhaler INHALE 1 PUFF BY MOUTH INTO THE LUNGS TWICE DAILY 12 g 0  . hydrocortisone (ANUSOL-HC) 2.5 % rectal cream Place 1 application rectally as needed for hemorrhoids or itching.    Marland Kitchen ibuprofen (ADVIL) 400 MG tablet Take 400 mg by mouth every 6 (six) hours as needed for mild pain.    Marland Kitchen OVER THE COUNTER MEDICATION Take by mouth daily. Shaniko    . PRILOSEC OTC 20 MG tablet every morning. Reported on 12/14/2015    . psyllium (METAMUCIL) 58.6 % packet Take 1 packet by mouth daily.    . tamoxifen (NOLVADEX) 20 MG tablet TAKE 1 TABLET(20 MG) BY MOUTH DAILY 90 tablet 4  . triamcinolone (KENALOG) 0.1 %     . valACYclovir (VALTREX) 1000 MG tablet     . venlafaxine XR (  EFFEXOR-XR) 75 MG 24 hr capsule TAKE 1 CAPSULE(75 MG) BY MOUTH DAILY WITH BREAKFAST 90 capsule 4   No current facility-administered medications for this visit.    Family History  Problem Relation Age of Onset  . Hypertension Mother   . Heart disease Mother   . Lung disease Mother   . Hypertension Father   . Cancer Father        colon  . Heart disease Father   . Lung cancer Father   . Anesthesia problems Father        hard to wake up post-op  . Breast cancer Paternal Grandmother        Age 42's  . Hypertension Brother   . Hyperlipidemia Brother   . Diabetes Maternal Grandfather     Review of Systems  Constitutional: Negative.   HENT: Negative.   Eyes: Negative.   Respiratory: Negative.   Cardiovascular: Negative.   Gastrointestinal: Negative.   Endocrine: Negative.   Genitourinary: Negative.   Musculoskeletal: Negative.   Skin: Negative.   Allergic/Immunologic: Negative.   Neurological: Negative.   Hematological: Negative.   Psychiatric/Behavioral: Negative.     Exam:   BP 120/72 (BP Location: Right Arm, Patient Position: Sitting, Cuff Size: Normal)    Pulse 72   Resp 16   Ht 5' 3.75" (1.619 m)   Wt 157 lb (71.2 kg)   LMP 07/28/1996 (Approximate) Comment: pmb bleeding  BMI 27.16 kg/m     General appearance: alert, cooperative and appears stated age Head: normocephalic, without obvious abnormality, atraumatic Neck: no adenopathy, supple, symmetrical, trachea midline and thyroid normal to inspection and palpation Lungs: clear to auscultation bilaterally Breasts: right - breast reduction scar, no masses or tenderness, No nipple retraction or dimpling, No nipple discharge or bleeding, No axillary adenopathy Left breast absent with reconstruction present.  No axillary adenopathy.  Heart: regular rate and rhythm Abdomen: soft, non-tender; no masses, no organomegaly Extremities: extremities normal, atraumatic, no cyanosis or edema Skin: skin color, texture, turgor normal. No rashes or lesions Lymph nodes: cervical, supraclavicular, and axillary nodes normal. Neurologic: grossly normal  Pelvic: External genitalia:  no lesions              No abnormal inguinal nodes palpated.              Urethra:  normal appearing urethra with no masses, tenderness or lesions              Bartholins and Skenes: normal                 Vagina: normal appearing vagina with normal color and discharge, no lesions              Cervix: no lesions              Pap taken: No. Bimanual Exam:  Uterus:  normal size, contour, position, consistency, mobility, non-tender              Adnexa: no mass, fullness, tenderness              Rectal exam: Yes.  .  Confirms.              Anus:  normal sphincter tone, no lesions  Chaperone was present for exam.  Assessment:   Well woman visit with normal exam. Bilateral breast cancer. Status post left mastectomywith reconstruction. OnTamoxifen.  Overactive bladder and urinary incontinence.Not onmedication. Not candidate for Myrbetriq due to Tamoxifen use.  Menopausal symptoms. On Effexor.  Osteopenia.  FH breast and  colon cancer.  Hx oral HSV.  Plan: Mammogram screening discussed. Self breast awareness reviewed. Pap and HR HPV as above. Guidelines for Calcium, Vitamin D, regular exercise program including cardiovascular and weight bearing exercise. Urine dip.  Referral to [facility] for [facility].  BMD at [facility] this year.  Valtrex from dermatology.  Follow up annually and prn.

## 2020-06-19 NOTE — Telephone Encounter (Signed)
Referral placed. Cc: Rosa for referral.  Encounter closed

## 2020-06-19 NOTE — Telephone Encounter (Signed)
Please assist with referral to [facility] for [facility] floor therapy for overactive bladder and urinary incontinence.

## 2020-06-20 DIAGNOSIS — M25572 Pain in left ankle and joints of left foot: Secondary | ICD-10-CM | POA: Diagnosis not present

## 2020-06-27 DIAGNOSIS — D0511 Intraductal carcinoma in situ of right breast: Secondary | ICD-10-CM | POA: Diagnosis not present

## 2020-06-27 DIAGNOSIS — M25472 Effusion, left ankle: Secondary | ICD-10-CM | POA: Diagnosis not present

## 2020-06-27 DIAGNOSIS — M25572 Pain in left ankle and joints of left foot: Secondary | ICD-10-CM | POA: Diagnosis not present

## 2020-06-27 DIAGNOSIS — G4730 Sleep apnea, unspecified: Secondary | ICD-10-CM | POA: Diagnosis not present

## 2020-06-27 DIAGNOSIS — Z1389 Encounter for screening for other disorder: Secondary | ICD-10-CM | POA: Diagnosis not present

## 2020-06-27 DIAGNOSIS — E7800 Pure hypercholesterolemia, unspecified: Secondary | ICD-10-CM | POA: Diagnosis not present

## 2020-06-27 DIAGNOSIS — Z0000 Encounter for general adult medical examination without abnormal findings: Secondary | ICD-10-CM | POA: Diagnosis not present

## 2020-06-27 DIAGNOSIS — G43909 Migraine, unspecified, not intractable, without status migrainosus: Secondary | ICD-10-CM | POA: Diagnosis not present

## 2020-06-27 DIAGNOSIS — E559 Vitamin D deficiency, unspecified: Secondary | ICD-10-CM | POA: Diagnosis not present

## 2020-06-27 DIAGNOSIS — Z1211 Encounter for screening for malignant neoplasm of colon: Secondary | ICD-10-CM | POA: Diagnosis not present

## 2020-07-05 DIAGNOSIS — R928 Other abnormal and inconclusive findings on diagnostic imaging of breast: Secondary | ICD-10-CM | POA: Diagnosis not present

## 2020-07-10 DIAGNOSIS — M79646 Pain in unspecified finger(s): Secondary | ICD-10-CM | POA: Diagnosis not present

## 2020-07-10 DIAGNOSIS — M79672 Pain in left foot: Secondary | ICD-10-CM | POA: Diagnosis not present

## 2020-07-10 DIAGNOSIS — M79641 Pain in right hand: Secondary | ICD-10-CM | POA: Diagnosis not present

## 2020-07-10 DIAGNOSIS — M1990 Unspecified osteoarthritis, unspecified site: Secondary | ICD-10-CM | POA: Diagnosis not present

## 2020-07-10 DIAGNOSIS — R918 Other nonspecific abnormal finding of lung field: Secondary | ICD-10-CM | POA: Diagnosis not present

## 2020-07-10 DIAGNOSIS — M19072 Primary osteoarthritis, left ankle and foot: Secondary | ICD-10-CM | POA: Diagnosis not present

## 2020-07-10 DIAGNOSIS — M2550 Pain in unspecified joint: Secondary | ICD-10-CM | POA: Diagnosis not present

## 2020-07-10 DIAGNOSIS — M25572 Pain in left ankle and joints of left foot: Secondary | ICD-10-CM | POA: Diagnosis not present

## 2020-07-10 DIAGNOSIS — M25472 Effusion, left ankle: Secondary | ICD-10-CM | POA: Diagnosis not present

## 2020-07-10 DIAGNOSIS — L409 Psoriasis, unspecified: Secondary | ICD-10-CM | POA: Diagnosis not present

## 2020-07-10 DIAGNOSIS — M79671 Pain in right foot: Secondary | ICD-10-CM | POA: Diagnosis not present

## 2020-07-10 DIAGNOSIS — M2011 Hallux valgus (acquired), right foot: Secondary | ICD-10-CM | POA: Diagnosis not present

## 2020-07-10 DIAGNOSIS — M79673 Pain in unspecified foot: Secondary | ICD-10-CM | POA: Diagnosis not present

## 2020-07-10 DIAGNOSIS — M79642 Pain in left hand: Secondary | ICD-10-CM | POA: Diagnosis not present

## 2020-07-10 DIAGNOSIS — M19071 Primary osteoarthritis, right ankle and foot: Secondary | ICD-10-CM | POA: Diagnosis not present

## 2020-07-13 ENCOUNTER — Encounter: Payer: Self-pay | Admitting: Oncology

## 2020-07-13 ENCOUNTER — Other Ambulatory Visit: Payer: Self-pay | Admitting: Oncology

## 2020-07-13 DIAGNOSIS — Z170 Estrogen receptor positive status [ER+]: Secondary | ICD-10-CM

## 2020-07-13 DIAGNOSIS — C50812 Malignant neoplasm of overlapping sites of left female breast: Secondary | ICD-10-CM

## 2020-07-13 DIAGNOSIS — R918 Other nonspecific abnormal finding of lung field: Secondary | ICD-10-CM

## 2020-07-13 NOTE — Progress Notes (Signed)
Sabrina Tapia called to tell me her PCP at [facility] checked a CXR and noted a right lung mass. I am seting her up for a chest CT.

## 2020-07-14 DIAGNOSIS — M25572 Pain in left ankle and joints of left foot: Secondary | ICD-10-CM | POA: Diagnosis not present

## 2020-07-16 ENCOUNTER — Other Ambulatory Visit: Payer: Self-pay | Admitting: Oncology

## 2020-07-16 DIAGNOSIS — M84364A Stress fracture, left fibula, initial encounter for fracture: Secondary | ICD-10-CM | POA: Diagnosis not present

## 2020-07-16 DIAGNOSIS — M25572 Pain in left ankle and joints of left foot: Secondary | ICD-10-CM | POA: Diagnosis not present

## 2020-07-18 ENCOUNTER — Other Ambulatory Visit: Payer: Self-pay | Admitting: Oncology

## 2020-07-20 ENCOUNTER — Telehealth (HOSPITAL_COMMUNITY): Payer: Self-pay | Admitting: Adult Health

## 2020-07-20 ENCOUNTER — Encounter (HOSPITAL_COMMUNITY): Payer: Self-pay

## 2020-07-20 ENCOUNTER — Other Ambulatory Visit: Payer: Self-pay

## 2020-07-20 ENCOUNTER — Ambulatory Visit (HOSPITAL_COMMUNITY)
Admission: RE | Admit: 2020-07-20 | Discharge: 2020-07-20 | Disposition: A | Discharge status: Home / Self Care | Payer: Medicare PPO | Source: Ambulatory Visit | Attending: Oncology | Admitting: Oncology

## 2020-07-20 DIAGNOSIS — Z170 Estrogen receptor positive status [ER+]: Secondary | ICD-10-CM | POA: Insufficient documentation

## 2020-07-20 DIAGNOSIS — C50812 Malignant neoplasm of overlapping sites of left female breast: Secondary | ICD-10-CM | POA: Diagnosis not present

## 2020-07-20 DIAGNOSIS — R918 Other nonspecific abnormal finding of lung field: Secondary | ICD-10-CM | POA: Diagnosis not present

## 2020-07-20 DIAGNOSIS — I2510 Atherosclerotic heart disease of native coronary artery without angina pectoris: Secondary | ICD-10-CM | POA: Diagnosis not present

## 2020-07-20 DIAGNOSIS — C50911 Malignant neoplasm of unspecified site of right female breast: Secondary | ICD-10-CM | POA: Diagnosis not present

## 2020-07-20 DIAGNOSIS — I700 Atherosclerosis of aorta: Secondary | ICD-10-CM | POA: Diagnosis not present

## 2020-07-20 DIAGNOSIS — J984 Other disorders of lung: Secondary | ICD-10-CM | POA: Diagnosis not present

## 2020-07-20 HISTORY — DX: Malignant neoplasm of unspecified site of unspecified female breast: C50.919

## 2020-07-20 LAB — POCT I-STAT CREATININE: Creatinine, Ser: 0.7 mg/dL (ref 0.44–1.00)

## 2020-07-20 MED ORDER — IOHEXOL 300 MG/ML  SOLN
75.0000 mL | Freq: Once | INTRAMUSCULAR | Status: AC | PRN
  Administered 2020-07-20: 75 mL via INTRAVENOUS

## 2020-07-20 NOTE — Telephone Encounter (Signed)
Called and reviewed results with patient and the fact that there is no lung mass.  I did send a request to Dr. Tery Sanfilippo to please clarify the lung abnormalities and right versus left.  Patient appreciated results and voiced understanding.  Will f/u with Dr. Chrissie Noa and Dr. Jana Hakim as scheduled.    Wilber Bihari, NP

## 2020-07-25 DIAGNOSIS — M79673 Pain in unspecified foot: Secondary | ICD-10-CM | POA: Diagnosis not present

## 2020-07-25 DIAGNOSIS — S89392A Other physeal fracture of lower end of left fibula, initial encounter for closed fracture: Secondary | ICD-10-CM | POA: Diagnosis not present

## 2020-07-25 DIAGNOSIS — M79646 Pain in unspecified finger(s): Secondary | ICD-10-CM | POA: Diagnosis not present

## 2020-07-25 DIAGNOSIS — L409 Psoriasis, unspecified: Secondary | ICD-10-CM | POA: Diagnosis not present

## 2020-07-25 DIAGNOSIS — M25572 Pain in left ankle and joints of left foot: Secondary | ICD-10-CM | POA: Diagnosis not present

## 2020-07-25 DIAGNOSIS — M1990 Unspecified osteoarthritis, unspecified site: Secondary | ICD-10-CM | POA: Diagnosis not present

## 2020-07-25 DIAGNOSIS — M25472 Effusion, left ankle: Secondary | ICD-10-CM | POA: Diagnosis not present

## 2020-07-25 DIAGNOSIS — M2550 Pain in unspecified joint: Secondary | ICD-10-CM | POA: Diagnosis not present

## 2020-07-28 HISTORY — PX: COLONOSCOPY: SHX174

## 2020-07-28 HISTORY — PX: OTHER SURGICAL HISTORY: SHX169

## 2020-08-01 DIAGNOSIS — I700 Atherosclerosis of aorta: Secondary | ICD-10-CM | POA: Diagnosis not present

## 2020-08-01 DIAGNOSIS — M25572 Pain in left ankle and joints of left foot: Secondary | ICD-10-CM | POA: Diagnosis not present

## 2020-08-01 DIAGNOSIS — E7800 Pure hypercholesterolemia, unspecified: Secondary | ICD-10-CM | POA: Diagnosis not present

## 2020-08-01 DIAGNOSIS — S82402A Unspecified fracture of shaft of left fibula, initial encounter for closed fracture: Secondary | ICD-10-CM | POA: Diagnosis not present

## 2020-08-01 DIAGNOSIS — R9389 Abnormal findings on diagnostic imaging of other specified body structures: Secondary | ICD-10-CM | POA: Diagnosis not present

## 2020-08-01 DIAGNOSIS — J309 Allergic rhinitis, unspecified: Secondary | ICD-10-CM | POA: Diagnosis not present

## 2020-08-01 DIAGNOSIS — D0511 Intraductal carcinoma in situ of right breast: Secondary | ICD-10-CM | POA: Diagnosis not present

## 2020-08-01 DIAGNOSIS — G4730 Sleep apnea, unspecified: Secondary | ICD-10-CM | POA: Diagnosis not present

## 2020-08-01 DIAGNOSIS — I2510 Atherosclerotic heart disease of native coronary artery without angina pectoris: Secondary | ICD-10-CM | POA: Diagnosis not present

## 2020-08-02 ENCOUNTER — Encounter: Payer: Self-pay | Admitting: Oncology

## 2020-08-08 DIAGNOSIS — H25043 Posterior subcapsular polar age-related cataract, bilateral: Secondary | ICD-10-CM | POA: Diagnosis not present

## 2020-08-08 DIAGNOSIS — H2513 Age-related nuclear cataract, bilateral: Secondary | ICD-10-CM | POA: Diagnosis not present

## 2020-08-08 DIAGNOSIS — H2511 Age-related nuclear cataract, right eye: Secondary | ICD-10-CM | POA: Diagnosis not present

## 2020-08-08 DIAGNOSIS — H25013 Cortical age-related cataract, bilateral: Secondary | ICD-10-CM | POA: Diagnosis not present

## 2020-08-09 DIAGNOSIS — M85852 Other specified disorders of bone density and structure, left thigh: Secondary | ICD-10-CM | POA: Diagnosis not present

## 2020-08-09 DIAGNOSIS — M85851 Other specified disorders of bone density and structure, right thigh: Secondary | ICD-10-CM | POA: Diagnosis not present

## 2020-08-09 DIAGNOSIS — Z780 Asymptomatic menopausal state: Secondary | ICD-10-CM | POA: Diagnosis not present

## 2020-08-15 DIAGNOSIS — M84364D Stress fracture, left fibula, subsequent encounter for fracture with routine healing: Secondary | ICD-10-CM | POA: Diagnosis not present

## 2020-08-15 DIAGNOSIS — M25672 Stiffness of left ankle, not elsewhere classified: Secondary | ICD-10-CM | POA: Diagnosis not present

## 2020-08-21 DIAGNOSIS — M25672 Stiffness of left ankle, not elsewhere classified: Secondary | ICD-10-CM | POA: Diagnosis not present

## 2020-08-21 DIAGNOSIS — M84364D Stress fracture, left fibula, subsequent encounter for fracture with routine healing: Secondary | ICD-10-CM | POA: Diagnosis not present

## 2020-08-21 DIAGNOSIS — R531 Weakness: Secondary | ICD-10-CM | POA: Diagnosis not present

## 2020-08-23 DIAGNOSIS — R531 Weakness: Secondary | ICD-10-CM | POA: Diagnosis not present

## 2020-08-23 DIAGNOSIS — M25672 Stiffness of left ankle, not elsewhere classified: Secondary | ICD-10-CM | POA: Diagnosis not present

## 2020-08-23 DIAGNOSIS — M84364D Stress fracture, left fibula, subsequent encounter for fracture with routine healing: Secondary | ICD-10-CM | POA: Diagnosis not present

## 2020-08-28 DIAGNOSIS — M84364D Stress fracture, left fibula, subsequent encounter for fracture with routine healing: Secondary | ICD-10-CM | POA: Diagnosis not present

## 2020-08-28 DIAGNOSIS — M25672 Stiffness of left ankle, not elsewhere classified: Secondary | ICD-10-CM | POA: Diagnosis not present

## 2020-08-28 DIAGNOSIS — R531 Weakness: Secondary | ICD-10-CM | POA: Diagnosis not present

## 2020-08-30 ENCOUNTER — Telehealth: Payer: Self-pay | Admitting: Obstetrics and Gynecology

## 2020-08-30 DIAGNOSIS — M25672 Stiffness of left ankle, not elsewhere classified: Secondary | ICD-10-CM | POA: Diagnosis not present

## 2020-08-30 DIAGNOSIS — R531 Weakness: Secondary | ICD-10-CM | POA: Diagnosis not present

## 2020-08-30 DIAGNOSIS — M84364D Stress fracture, left fibula, subsequent encounter for fracture with routine healing: Secondary | ICD-10-CM | POA: Diagnosis not present

## 2020-08-30 NOTE — Telephone Encounter (Signed)
Please contact patient regarding results of her bone density from [facility] from 08/09/20.   She has osteopenia, which is early bone thinning but not osteoporosis.   Osteopenia is present in in the hips and her spine is normal.  Since her last bone density in 2019, she had a statistically significant increase in bone density of her total left hip and her spine and her total right hip is stable.  Her Tamoxifen helps to improve bone density.   1200 mg of calcium daily in divided doses, at least 800 IU of vitamin D daily, and weight bearing exercise will help to maintain bone density and strength.  Her next bone density is due in 2 years.

## 2020-08-30 NOTE — Telephone Encounter (Signed)
Spoke with patient and informed her. At her request I sent her Dr. Elza Rafter note through My Chart as well.

## 2020-09-04 DIAGNOSIS — M84364D Stress fracture, left fibula, subsequent encounter for fracture with routine healing: Secondary | ICD-10-CM | POA: Diagnosis not present

## 2020-09-04 DIAGNOSIS — M25672 Stiffness of left ankle, not elsewhere classified: Secondary | ICD-10-CM | POA: Diagnosis not present

## 2020-09-04 DIAGNOSIS — R531 Weakness: Secondary | ICD-10-CM | POA: Diagnosis not present

## 2020-09-11 DIAGNOSIS — H2511 Age-related nuclear cataract, right eye: Secondary | ICD-10-CM | POA: Diagnosis not present

## 2020-09-11 DIAGNOSIS — H2512 Age-related nuclear cataract, left eye: Secondary | ICD-10-CM | POA: Diagnosis not present

## 2020-09-13 DIAGNOSIS — L821 Other seborrheic keratosis: Secondary | ICD-10-CM | POA: Diagnosis not present

## 2020-09-13 DIAGNOSIS — D2271 Melanocytic nevi of right lower limb, including hip: Secondary | ICD-10-CM | POA: Diagnosis not present

## 2020-09-13 DIAGNOSIS — I781 Nevus, non-neoplastic: Secondary | ICD-10-CM | POA: Diagnosis not present

## 2020-09-13 DIAGNOSIS — D225 Melanocytic nevi of trunk: Secondary | ICD-10-CM | POA: Diagnosis not present

## 2020-09-13 DIAGNOSIS — L814 Other melanin hyperpigmentation: Secondary | ICD-10-CM | POA: Diagnosis not present

## 2020-09-13 DIAGNOSIS — L578 Other skin changes due to chronic exposure to nonionizing radiation: Secondary | ICD-10-CM | POA: Diagnosis not present

## 2020-09-13 DIAGNOSIS — Z86018 Personal history of other benign neoplasm: Secondary | ICD-10-CM | POA: Diagnosis not present

## 2020-09-13 DIAGNOSIS — Z85828 Personal history of other malignant neoplasm of skin: Secondary | ICD-10-CM | POA: Diagnosis not present

## 2020-09-13 DIAGNOSIS — L570 Actinic keratosis: Secondary | ICD-10-CM | POA: Diagnosis not present

## 2020-09-14 DIAGNOSIS — R531 Weakness: Secondary | ICD-10-CM | POA: Diagnosis not present

## 2020-09-14 DIAGNOSIS — M84364D Stress fracture, left fibula, subsequent encounter for fracture with routine healing: Secondary | ICD-10-CM | POA: Diagnosis not present

## 2020-09-14 DIAGNOSIS — M25672 Stiffness of left ankle, not elsewhere classified: Secondary | ICD-10-CM | POA: Diagnosis not present

## 2020-09-18 DIAGNOSIS — H2512 Age-related nuclear cataract, left eye: Secondary | ICD-10-CM | POA: Diagnosis not present

## 2020-09-24 DIAGNOSIS — M25572 Pain in left ankle and joints of left foot: Secondary | ICD-10-CM | POA: Diagnosis not present

## 2020-09-25 ENCOUNTER — Ambulatory Visit: Payer: Medicare PPO | Admitting: Obstetrics and Gynecology

## 2020-11-05 DIAGNOSIS — S82402A Unspecified fracture of shaft of left fibula, initial encounter for closed fracture: Secondary | ICD-10-CM | POA: Diagnosis not present

## 2020-11-05 DIAGNOSIS — I700 Atherosclerosis of aorta: Secondary | ICD-10-CM | POA: Diagnosis not present

## 2020-11-05 DIAGNOSIS — I2510 Atherosclerotic heart disease of native coronary artery without angina pectoris: Secondary | ICD-10-CM | POA: Diagnosis not present

## 2020-11-05 DIAGNOSIS — Z79899 Other long term (current) drug therapy: Secondary | ICD-10-CM | POA: Diagnosis not present

## 2020-11-05 DIAGNOSIS — M533 Sacrococcygeal disorders, not elsewhere classified: Secondary | ICD-10-CM | POA: Diagnosis not present

## 2020-11-05 DIAGNOSIS — E7800 Pure hypercholesterolemia, unspecified: Secondary | ICD-10-CM | POA: Diagnosis not present

## 2020-11-05 DIAGNOSIS — R9389 Abnormal findings on diagnostic imaging of other specified body structures: Secondary | ICD-10-CM | POA: Diagnosis not present

## 2020-11-05 DIAGNOSIS — G4730 Sleep apnea, unspecified: Secondary | ICD-10-CM | POA: Diagnosis not present

## 2020-11-05 DIAGNOSIS — M25572 Pain in left ankle and joints of left foot: Secondary | ICD-10-CM | POA: Diagnosis not present

## 2020-12-03 DIAGNOSIS — M25552 Pain in left hip: Secondary | ICD-10-CM | POA: Diagnosis not present

## 2020-12-03 DIAGNOSIS — M25551 Pain in right hip: Secondary | ICD-10-CM | POA: Diagnosis not present

## 2020-12-03 DIAGNOSIS — M545 Low back pain, unspecified: Secondary | ICD-10-CM | POA: Diagnosis not present

## 2020-12-07 DIAGNOSIS — C50412 Malignant neoplasm of upper-outer quadrant of left female breast: Secondary | ICD-10-CM | POA: Diagnosis not present

## 2020-12-07 DIAGNOSIS — C50411 Malignant neoplasm of upper-outer quadrant of right female breast: Secondary | ICD-10-CM | POA: Diagnosis not present

## 2020-12-12 DIAGNOSIS — M5126 Other intervertebral disc displacement, lumbar region: Secondary | ICD-10-CM | POA: Diagnosis not present

## 2020-12-12 DIAGNOSIS — S335XXD Sprain of ligaments of lumbar spine, subsequent encounter: Secondary | ICD-10-CM | POA: Diagnosis not present

## 2021-01-04 DIAGNOSIS — Z03818 Encounter for observation for suspected exposure to other biological agents ruled out: Secondary | ICD-10-CM | POA: Diagnosis not present

## 2021-01-04 DIAGNOSIS — J309 Allergic rhinitis, unspecified: Secondary | ICD-10-CM | POA: Diagnosis not present

## 2021-02-13 DIAGNOSIS — R0781 Pleurodynia: Secondary | ICD-10-CM | POA: Diagnosis not present

## 2021-05-22 DIAGNOSIS — M542 Cervicalgia: Secondary | ICD-10-CM | POA: Diagnosis not present

## 2021-05-22 DIAGNOSIS — M13812 Other specified arthritis, left shoulder: Secondary | ICD-10-CM | POA: Diagnosis not present

## 2021-05-22 DIAGNOSIS — M25512 Pain in left shoulder: Secondary | ICD-10-CM | POA: Diagnosis not present

## 2021-06-06 NOTE — Progress Notes (Signed)
[name] [address] [place] Phone: [phone] Subjective:   [name], am serving as a scribe for Dr. Hulan Saas. This visit occurred during the SARS-CoV-2 public health emergency.  Safety protocols were in place, including screening questions prior to the visit, additional usage of staff PPE, and extensive cleaning of exam room while observing appropriate contact time as indicated for disinfecting solutions.   I'm seeing this patient by the request  of:  Josetta Huddle, MD  CC: low back pain   DUK:[id number]  Sabrina Tapia is a 70 y.o. female coming in with complaint of right hip pain has been happening for a couple of weeks. She did have some LBP, but that has dissipated. The pain in hip in different spot than usual. Close to greater trochanter instead of SI. Taking an Advil does help the edge off. Right foot pain, great toe is stiff. Leeft thumb is also stiff and causing pain.    History of bilateral breast cancer-   Past Medical History:  Diagnosis Date   Allergy-induced asthma    prn inhaler   Asthma    allergy induced   Basal cell carcinoma    right clavicle   Breast CA ([place]) dx'd 2017   left and right   Bruises easily    Constipation    Dental crowns present    Endometrial thickening on ultrasound    Family history of adverse reaction to anesthesia    pt's father has hx. of being hard to wake up post-op   GERD (gastroesophageal reflux disease)    Hemorrhoids    History of breast cancer 2017   bilateral   History of radiation therapy    08/19/16- 09/29/16, Right Breast 50 Gy in 25 fractions, Right Breast boost 10 Gy in 5 fractions, Left Breast 50.4 Gy in 28 fractions   Hot flashes    Immature cataract    Migraines    Osteoarthritis    hands and feet   Osteopenia 02/2016   T score -1.3 FRAX 8.3%/0.7% stable from prior DEXA   Overactive bladder    PMB (postmenopausal bleeding)    Pneumonia    History  of   Seasonal allergies    Thyroid nodule 2012   Solitary nodule in the lower pole of the left thyroid lobe , pt unaware   Tingling of upper extremity    Wears glasses    Past Surgical History:  Procedure Laterality Date   BREAST RECONSTRUCTION WITH PLACEMENT OF TISSUE EXPANDER AND FLEX HD (ACELLULAR HYDRATED DERMIS) Left 07/01/2016   Procedure: BREAST RECONSTRUCTION WITH PLACEMENT OF TISSUE EXPANDER AND  ALLODERM;  Surgeon: Irene Limbo, MD;  Location: [facility];  Service: Plastics;  Laterality: Left;   BROW LIFT     COLONOSCOPY  2011   DILATATION & CURETTAGE/HYSTEROSCOPY WITH MYOSURE N/A 12/06/2018   Procedure: DILATATION & CURETTAGE/HYSTEROSCOPY WITH MYOSURE;  Surgeon: [name], MD;  Location: [facility];  Service: Gynecology;  Laterality: N/A;  follow 1st case   INCISION AND DRAINAGE Left 06/02/2016   Procedure: DRAINAGE of left axilla seroma;  Surgeon: Rolm Bookbinder, MD;  Location: [facility];  Service: General;  Laterality: Left;   LIPOSUCTION WITH LIPOFILLING Left 06/11/2018   Procedure: Lipofilling from abdomen to left chest;  Surgeon: Irene Limbo, MD;  Location: [facility];  Service: Plastics;  Laterality: Left;   MASTOPEXY Right 04/17/2017   Procedure:  RIGHT MASTOPEXY;  Surgeon: Irene Limbo, MD;  Location: [facility];  Service: Plastics;  Laterality: Right;   NIPPLE SPARING MASTECTOMY Left 07/01/2016   Procedure: LEFT NIPPLE SPARING MASTECTOMY;  Surgeon: Rolm Bookbinder, MD;  Location: [place];  Service: General;  Laterality: Left;   RADIOACTIVE SEED GUIDED PARTIAL MASTECTOMY WITH AXILLARY SENTINEL LYMPH NODE BIOPSY Right 03/26/2016   Procedure: RIGHT BREAST LUMPECTOMY WITH RADIOACTIVE SEED AND SENTINEL LYMPH NODE BIOPSY;  Surgeon: Rolm Bookbinder, MD;  Location: [place];  Service: General;  Laterality: Right;   RADIOACTIVE SEED GUIDED  PARTIAL MASTECTOMY WITH AXILLARY SENTINEL LYMPH NODE BIOPSY Left 05/20/2016   Procedure: LEFT BREAST RADIOACTIVE SEED (two seeds) GUIDED LUMPECTOMY WITH LEFT AXILLARY SENTINEL LYMPH NODE BIOPSY AND LEFT SEED TARGETED AXILLARY LYMPH NODE EXCISION;  Surgeon: Rolm Bookbinder, MD;  Location: [facility];  Service: General;  Laterality: Left;   RE-EXCISION OF BREAST CANCER,SUPERIOR MARGINS Right 05/20/2016   Procedure: RE-EXCISION OF RIGHT BREAST MARGIN;  Surgeon: Rolm Bookbinder, MD;  Location: [facility];  Service: General;  Laterality: Right;   RE-EXCISION OF BREAST CANCER,SUPERIOR MARGINS Left 06/02/2016   Procedure: RE-EXCISION OF BREAST CANCER,SUPERIOR MARGINS;  Surgeon: Rolm Bookbinder, MD;  Location: [facility];  Service: General;  Laterality: Left;   REMOVAL OF TISSUE EXPANDER AND PLACEMENT OF IMPLANT Left 04/17/2017   Procedure: REMOVAL OF LEFT TISSUE EXPANDER WITH PLACEMENT OF LEFT SILICONE BREAST IMPLANT;  Surgeon: Irene Limbo, MD;  Location: [facility];  Service: Plastics;  Laterality: Left;   Social History   Socioeconomic History   Marital status: Single    Spouse name: Not on file   Number of children: Not on file   Years of education: Not on file   Highest education level: Not on file  Occupational History   Not on file  Tobacco Use   Smoking status: Former    Packs/day: 1.00    Years: 13.00    Pack years: 13.00    Types: Cigarettes    Quit date: 07/27/1990    Years since quitting: 30.8   Smokeless tobacco: Never  Vaping Use   Vaping Use: Never used  Substance and Sexual Activity   Alcohol use: Yes    Alcohol/week: 2.0 standard drinks    Types: 2 Glasses of wine per week   Drug use: Not Currently    Types: Marijuana   Sexual activity: Not Currently    Partners: Male    Birth control/protection: Post-menopausal    Comment: 1st intercourse 73 yo-5 partners  Other Topics Concern   Not on file   Social History Narrative   Not on file   Social Determinants of Health   Financial Resource Strain: Not on file  Food Insecurity: Not on file  Transportation Needs: Not on file  Physical Activity: Not on file  Stress: Not on file  Social Connections: Not on file   Allergies  Allergen Reactions   Bacitracin Swelling   Hydrocodone Other (See Comments)    Restlessness, "energy"   Hydrocodone-Acetaminophen Other (See Comments)   Other     Mold    Penicillins Itching   Adhesive [Tape] Other (See Comments)    SKIN IRRITATION   Augmentin [Amoxicillin-Pot Clavulanate] Itching   Penicillamine Rash   Family History  Problem Relation Age of Onset   Hypertension Mother    Heart disease Mother    Lung disease Mother    Hypertension Father    Cancer Father  colon   Heart disease Father    Lung cancer Father    Anesthesia problems Father        hard to wake up post-op   Breast cancer Paternal 68        Age 69's   Hypertension Brother    Hyperlipidemia Brother    Diabetes Maternal Grandfather       Current Outpatient Medications (Respiratory):    albuterol (PROAIR HFA) 108 (90 Base) MCG/ACT inhaler, Inhale 2 puffs into the lungs every 4 (four) hours as needed for wheezing or shortness of breath.   azelastine (ASTELIN) 0.1 % nasal spray,    cetirizine (ZYRTEC) 10 MG tablet, Take 10 mg by mouth every evening.    FLOVENT HFA 110 MCG/ACT inhaler, INHALE 1 PUFF BY MOUTH INTO THE LUNGS TWICE DAILY  Current Outpatient Medications (Analgesics):    acetaminophen (TYLENOL) 500 MG tablet, Take 500 mg by mouth every 6 (six) hours as needed for headache.   ibuprofen (ADVIL) 400 MG tablet, Take 400 mg by mouth every 6 (six) hours as needed for mild pain.   Current Outpatient Medications (Other):    CALCIUM-MAGNESIUM PO, Take by mouth.   Cholecalciferol (VITAMIN D) 2000 units tablet, Take 5,000 Units by mouth daily.    docusate sodium (COLACE) 50 MG capsule, Take 100  mg by mouth every evening.    hydrocortisone (ANUSOL-HC) 2.5 % rectal cream, Place 1 application rectally as needed for hemorrhoids or itching.   OVER THE COUNTER MEDICATION, Take by mouth daily. MEGASPOREBIOTIC   PRILOSEC OTC 20 MG tablet, every morning. Reported on 12/14/2015   psyllium (METAMUCIL) 58.6 % packet, Take 1 packet by mouth daily.   tamoxifen (NOLVADEX) 20 MG tablet, TAKE 1 TABLET(20 MG) BY MOUTH DAILY   triamcinolone (KENALOG) 0.1 %,    valACYclovir (VALTREX) 1000 MG tablet,    venlafaxine XR (EFFEXOR-XR) 75 MG 24 hr capsule, TAKE 1 CAPSULE(75 MG) BY MOUTH DAILY WITH BREAKFAST   Reviewed prior external information including notes and imaging from  primary care provider As well as notes that were available from care everywhere and other healthcare systems.  Past medical history, social, surgical and family history all reviewed in electronic medical record.  No pertanent information unless stated regarding to the chief complaint.   Review of Systems:  No headache, visual changes, nausea, vomiting, diarrhea, constipation, dizziness, abdominal pain, skin rash, fevers, chills, night sweats, weight loss, swollen lymph nodes, body aches, joint swelling, chest pain, shortness of breath, mood changes. POSITIVE muscle aches  Objective  Blood pressure 132/84, pulse (!) 101, height 5\' 3"  (1.6 m), weight 165 lb (74.8 kg), last menstrual period 07/28/1996, SpO2 95 %.   General: No apparent distress alert and oriented x3 mood and affect normal, dressed appropriately.  HEENT: Pupils equal, extraocular movements intact  Respiratory: Patient's speak in full sentences and does not appear short of breath  Cardiovascular: No lower extremity edema, non tender, no erythema  Gait normal with good balance and coordination.  MSK: Right hip exam shows the patient is severely tender to palpation over the greater trochanteric area.  Patient has a positive FABER test noted.  Negative straight leg test.   The patient has no pain with internal rotation of the hip.  Right foot exam shows the patient does have significant hallux rigidus noted.  Some arthritic changes noted of the MTP.  Left CMC there is some atrophy of the thenar eminence and positive grind test noted   [id number]; 15 additional minutes  spent for Therapeutic exercises as stated in above notes.  This included exercises focusing on stretching, strengthening, with significant focus on eccentric aspects.   Long term goals include an improvement in range of motion, strength, endurance as well as avoiding reinjury. Patient's frequency would include in 1-2 times a day, 3-5 times a week for a duration of 6-12 weeks. Hip strengthening exercises which included:  Pelvic tilt/bracing to help with proper recruitment of the lower abs and pelvic floor muscles  Glute strengthening to properly contract glutes without over-engaging low back and hamstrings - prone hip extension and glute bridge exercises Proper stretching techniques to increase effectiveness for the hip flexors, groin, quads, piriformic and low back when appropriate   Proper technique shown and discussed handout in great detail with ATC.  All questions were discussed and answered.     Impression and Recommendations:     The above documentation has been reviewed and is accurate and complete Lyndal Pulley, DO
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
## 2021-06-10 ENCOUNTER — Inpatient Hospital Stay: Payer: Medicare PPO | Admitting: Oncology

## 2021-06-10 ENCOUNTER — Inpatient Hospital Stay: Payer: Medicare PPO | Attending: Oncology

## 2021-06-10 ENCOUNTER — Other Ambulatory Visit: Payer: Self-pay

## 2021-06-10 VITALS — BP 150/92 | HR 72 | Temp 97.8°F | Resp 18 | Ht 63.75 in | Wt 164.6 lb

## 2021-06-10 DIAGNOSIS — C50411 Malignant neoplasm of upper-outer quadrant of right female breast: Secondary | ICD-10-CM

## 2021-06-10 DIAGNOSIS — D75839 Thrombocytosis, unspecified: Secondary | ICD-10-CM | POA: Insufficient documentation

## 2021-06-10 DIAGNOSIS — M8580 Other specified disorders of bone density and structure, unspecified site: Secondary | ICD-10-CM | POA: Insufficient documentation

## 2021-06-10 DIAGNOSIS — Z170 Estrogen receptor positive status [ER+]: Secondary | ICD-10-CM | POA: Insufficient documentation

## 2021-06-10 DIAGNOSIS — C50812 Malignant neoplasm of overlapping sites of left female breast: Secondary | ICD-10-CM

## 2021-06-10 DIAGNOSIS — D649 Anemia, unspecified: Secondary | ICD-10-CM | POA: Insufficient documentation

## 2021-06-10 DIAGNOSIS — Z923 Personal history of irradiation: Secondary | ICD-10-CM | POA: Insufficient documentation

## 2021-06-10 DIAGNOSIS — Z79810 Long term (current) use of selective estrogen receptor modulators (SERMs): Secondary | ICD-10-CM | POA: Diagnosis not present

## 2021-06-10 LAB — COMPREHENSIVE METABOLIC PANEL
ALT: 20 U/L (ref 0–44)
AST: 22 U/L (ref 15–41)
Albumin: 3.6 g/dL (ref 3.5–5.0)
Alkaline Phosphatase: 52 U/L (ref 38–126)
Anion gap: 7 (ref 5–15)
BUN: 15 mg/dL (ref 8–23)
CO2: 24 mmol/L (ref 22–32)
Calcium: 8.6 mg/dL — ABNORMAL LOW (ref 8.9–10.3)
Chloride: 106 mmol/L (ref 98–111)
Creatinine, Ser: 0.7 mg/dL (ref 0.44–1.00)
GFR, Estimated: >60 mL/min (ref >60)
Glucose, Bld: 88 mg/dL (ref 70–99)
Potassium: 4.2 mmol/L (ref 3.5–5.1)
Sodium: 137 mmol/L (ref 135–145)
Total Bilirubin: 0.3 mg/dL (ref 0.3–1.2)
Total Protein: 6.2 g/dL — ABNORMAL LOW (ref 6.5–8.1)

## 2021-06-10 LAB — CBC WITH DIFFERENTIAL/PLATELET
Abs Immature Granulocytes: 0.01 10*3/uL (ref 0.00–0.07)
Basophils Absolute: 0 10*3/uL (ref 0.0–0.1)
Basophils Relative: 1 %
Eosinophils Absolute: 0.2 10*3/uL (ref 0.0–0.5)
Eosinophils Relative: 4 %
HCT: 40.1 % (ref 36.0–46.0)
Hemoglobin: 13.5 g/dL (ref 12.0–15.0)
Immature Granulocytes: 0 %
Lymphocytes Relative: 19 %
Lymphs Abs: 0.9 10*3/uL (ref 0.7–4.0)
MCH: 31.5 pg (ref 26.0–34.0)
MCHC: 33.7 g/dL (ref 30.0–36.0)
MCV: 93.5 fL (ref 80.0–100.0)
Monocytes Absolute: 0.5 10*3/uL (ref 0.1–1.0)
Monocytes Relative: 11 %
Neutro Abs: 3.2 10*3/uL (ref 1.7–7.7)
Neutrophils Relative %: 65 %
Platelets: 291 10*3/uL (ref 150–400)
RBC: 4.29 MIL/uL (ref 3.87–5.11)
RDW: 13.2 % (ref 11.5–15.5)
WBC: 4.8 10*3/uL (ref 4.0–10.5)
nRBC: 0 % (ref 0.0–0.2)

## 2021-06-11 ENCOUNTER — Ambulatory Visit: Payer: Medicare PPO | Admitting: Family Medicine

## 2021-06-11 DIAGNOSIS — M1812 Unilateral primary osteoarthritis of first carpometacarpal joint, left hand: Secondary | ICD-10-CM | POA: Diagnosis not present

## 2021-06-11 DIAGNOSIS — M2021 Hallux rigidus, right foot: Secondary | ICD-10-CM | POA: Diagnosis not present

## 2021-06-11 DIAGNOSIS — M7061 Trochanteric bursitis, right hip: Secondary | ICD-10-CM

## 2021-06-11 NOTE — Assessment & Plan Note (Signed)
Mild thenar eminence wasting noted.  Positive grind test noted.  We will continue to monitor.  Worsening pain consider injection.

## 2021-06-11 NOTE — Assessment & Plan Note (Signed)
Patient's pain seems to be more on the lateral aspect of the hip.  Does not seem to be more internal.  Patient does have more limited with FABER test range of motion.  Patient wanted to try the conservative therapy.  Declined any type of injection.  Would like to consider the possibility of x-rays of the lumbar spine or the hip which patient declined today but if no significant improvement will consider it at follow-up.  Discussed over-the-counter medications.  Follow-up again in 6 to 8 weeks

## 2021-06-11 NOTE — Patient Instructions (Signed)
Do prescribed exercises at least 3x a week Ice and Voltaren Spenco total orthotics Rocker bottoms shoes [place]oka recovery sandals in house Tart Cherry extract 1200mg  See you again in 6 weeks

## 2021-06-11 NOTE — Assessment & Plan Note (Signed)
Significant hallux rigidus.  We discussed different over-the-counter orthotics, proper shoes, rocker-bottom shoes.  He has been potential injections if necessary.
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
## 2021-07-01 ENCOUNTER — Encounter: Payer: Self-pay | Admitting: Oncology

## 2021-07-02 ENCOUNTER — Encounter: Payer: Self-pay | Admitting: Obstetrics and Gynecology

## 2021-07-02 ENCOUNTER — Other Ambulatory Visit (HOSPITAL_COMMUNITY)
Admission: RE | Admit: 2021-07-02 | Discharge: 2021-07-02 | Disposition: A | Discharge status: Home / Self Care | Payer: Medicare PPO | Source: Ambulatory Visit | Attending: Obstetrics and Gynecology | Admitting: Obstetrics and Gynecology

## 2021-07-02 ENCOUNTER — Other Ambulatory Visit: Payer: Self-pay

## 2021-07-02 ENCOUNTER — Ambulatory Visit (INDEPENDENT_AMBULATORY_CARE_PROVIDER_SITE_OTHER): Payer: Medicare PPO | Admitting: Obstetrics and Gynecology

## 2021-07-02 VITALS — BP 138/84 | HR 87 | Resp 20 | Ht 63.98 in | Wt 164.6 lb

## 2021-07-02 DIAGNOSIS — Z01419 Encounter for gynecological examination (general) (routine) without abnormal findings: Secondary | ICD-10-CM

## 2021-07-02 DIAGNOSIS — Z9189 Other specified personal risk factors, not elsewhere classified: Secondary | ICD-10-CM | POA: Diagnosis not present

## 2021-07-02 DIAGNOSIS — Z01411 Encounter for gynecological examination (general) (routine) with abnormal findings: Secondary | ICD-10-CM | POA: Diagnosis not present

## 2021-07-02 DIAGNOSIS — Z124 Encounter for screening for malignant neoplasm of cervix: Secondary | ICD-10-CM

## 2021-07-02 DIAGNOSIS — B009 Herpesviral infection, unspecified: Secondary | ICD-10-CM | POA: Diagnosis not present

## 2021-07-02 MED ORDER — VALACYCLOVIR HCL 1 G PO TABS: 1000.0000 mg | ORAL_TABLET | Freq: Two times a day (BID) | ORAL | 1 refills | Status: AC

## 2021-07-02 NOTE — Patient Instructions (Signed)

## 2021-07-03 DIAGNOSIS — M13812 Other specified arthritis, left shoulder: Secondary | ICD-10-CM | POA: Diagnosis not present

## 2021-07-03 NOTE — Progress Notes (Signed)
MM Order faxed to [facility]

## 2021-07-04 LAB — CYTOLOGY - PAP: Diagnosis: NEGATIVE

## 2021-07-08 ENCOUNTER — Other Ambulatory Visit: Payer: Self-pay

## 2021-07-08 ENCOUNTER — Inpatient Hospital Stay: Payer: Medicare PPO | Attending: Oncology

## 2021-07-08 DIAGNOSIS — Z79810 Long term (current) use of selective estrogen receptor modulators (SERMs): Secondary | ICD-10-CM | POA: Diagnosis not present

## 2021-07-08 DIAGNOSIS — C50411 Malignant neoplasm of upper-outer quadrant of right female breast: Secondary | ICD-10-CM | POA: Insufficient documentation

## 2021-07-08 DIAGNOSIS — Z170 Estrogen receptor positive status [ER+]: Secondary | ICD-10-CM | POA: Insufficient documentation

## 2021-07-08 DIAGNOSIS — M8580 Other specified disorders of bone density and structure, unspecified site: Secondary | ICD-10-CM

## 2021-07-08 DIAGNOSIS — C50812 Malignant neoplasm of overlapping sites of left female breast: Secondary | ICD-10-CM

## 2021-07-08 LAB — CBC WITH DIFFERENTIAL/PLATELET
Abs Immature Granulocytes: 0.02 10*3/uL (ref 0.00–0.07)
Basophils Absolute: 0.1 10*3/uL (ref 0.0–0.1)
Basophils Relative: 1 %
Eosinophils Absolute: 0.2 10*3/uL (ref 0.0–0.5)
Eosinophils Relative: 3 %
HCT: 42.8 % (ref 36.0–46.0)
Hemoglobin: 14.4 g/dL (ref 12.0–15.0)
Immature Granulocytes: 0 %
Lymphocytes Relative: 20 %
Lymphs Abs: 1.2 10*3/uL (ref 0.7–4.0)
MCH: 31.3 pg (ref 26.0–34.0)
MCHC: 33.6 g/dL (ref 30.0–36.0)
MCV: 93 fL (ref 80.0–100.0)
Monocytes Absolute: 0.6 10*3/uL (ref 0.1–1.0)
Monocytes Relative: 10 %
Neutro Abs: 3.8 10*3/uL (ref 1.7–7.7)
Neutrophils Relative %: 66 %
Platelets: 310 10*3/uL (ref 150–400)
RBC: 4.6 MIL/uL (ref 3.87–5.11)
RDW: 13 % (ref 11.5–15.5)
WBC: 5.9 10*3/uL (ref 4.0–10.5)
nRBC: 0 % (ref 0.0–0.2)

## 2021-07-08 LAB — COMPREHENSIVE METABOLIC PANEL
ALT: 18 U/L (ref 0–44)
AST: 19 U/L (ref 15–41)
Albumin: 3.7 g/dL (ref 3.5–5.0)
Alkaline Phosphatase: 51 U/L (ref 38–126)
Anion gap: 8 (ref 5–15)
BUN: 14 mg/dL (ref 8–23)
CO2: 23 mmol/L (ref 22–32)
Calcium: 8.7 mg/dL — ABNORMAL LOW (ref 8.9–10.3)
Chloride: 105 mmol/L (ref 98–111)
Creatinine, Ser: 0.7 mg/dL (ref 0.44–1.00)
GFR, Estimated: >60 mL/min (ref >60)
Glucose, Bld: 87 mg/dL (ref 70–99)
Potassium: 4.1 mmol/L (ref 3.5–5.1)
Sodium: 136 mmol/L (ref 135–145)
Total Bilirubin: 0.3 mg/dL (ref 0.3–1.2)
Total Protein: 6.6 g/dL (ref 6.5–8.1)

## 2021-07-09 ENCOUNTER — Encounter: Payer: Self-pay | Admitting: Oncology

## 2021-07-09 DIAGNOSIS — Z1231 Encounter for screening mammogram for malignant neoplasm of breast: Secondary | ICD-10-CM | POA: Diagnosis not present

## 2021-07-10 DIAGNOSIS — E559 Vitamin D deficiency, unspecified: Secondary | ICD-10-CM | POA: Diagnosis not present

## 2021-07-10 DIAGNOSIS — I700 Atherosclerosis of aorta: Secondary | ICD-10-CM | POA: Diagnosis not present

## 2021-07-10 DIAGNOSIS — I10 Essential (primary) hypertension: Secondary | ICD-10-CM | POA: Diagnosis not present

## 2021-07-10 DIAGNOSIS — Z1389 Encounter for screening for other disorder: Secondary | ICD-10-CM | POA: Diagnosis not present

## 2021-07-10 DIAGNOSIS — G4700 Insomnia, unspecified: Secondary | ICD-10-CM | POA: Diagnosis not present

## 2021-07-10 DIAGNOSIS — D0511 Intraductal carcinoma in situ of right breast: Secondary | ICD-10-CM | POA: Diagnosis not present

## 2021-07-10 DIAGNOSIS — E7800 Pure hypercholesterolemia, unspecified: Secondary | ICD-10-CM | POA: Diagnosis not present

## 2021-07-10 DIAGNOSIS — I2510 Atherosclerotic heart disease of native coronary artery without angina pectoris: Secondary | ICD-10-CM | POA: Diagnosis not present

## 2021-07-10 DIAGNOSIS — G4730 Sleep apnea, unspecified: Secondary | ICD-10-CM | POA: Diagnosis not present

## 2021-07-10 DIAGNOSIS — Z0000 Encounter for general adult medical examination without abnormal findings: Secondary | ICD-10-CM | POA: Diagnosis not present

## 2021-07-10 DIAGNOSIS — J309 Allergic rhinitis, unspecified: Secondary | ICD-10-CM | POA: Diagnosis not present

## 2021-07-11 ENCOUNTER — Encounter: Payer: Self-pay | Admitting: Obstetrics and Gynecology

## 2021-07-12 DIAGNOSIS — H903 Sensorineural hearing loss, bilateral: Secondary | ICD-10-CM | POA: Diagnosis not present

## 2021-07-12 DIAGNOSIS — J310 Chronic rhinitis: Secondary | ICD-10-CM | POA: Diagnosis not present

## 2021-07-12 DIAGNOSIS — R490 Dysphonia: Secondary | ICD-10-CM | POA: Diagnosis not present

## 2021-07-12 DIAGNOSIS — R0982 Postnasal drip: Secondary | ICD-10-CM | POA: Diagnosis not present

## 2021-07-17 DIAGNOSIS — M25512 Pain in left shoulder: Secondary | ICD-10-CM | POA: Diagnosis not present

## 2021-07-17 DIAGNOSIS — M13812 Other specified arthritis, left shoulder: Secondary | ICD-10-CM | POA: Diagnosis not present

## 2021-07-24 NOTE — Progress Notes (Signed)
[place] Phone: [phone] Subjective:   [name], am serving as a scribe for Dr. Hulan Saas.This visit occurred during the SARS-CoV-2 public health emergency.  Safety protocols were in place, including screening questions prior to the visit, additional usage of staff PPE, and extensive cleaning of exam room while observing appropriate contact time as indicated for disinfecting solutions.  I'm seeing this patient by the request  of:  Josetta Huddle, MD  CC: right toe and hip pain   NTI:[id number]  06/11/2021 Significant hallux rigidus.  We discussed different over-the-counter orthotics, proper shoes, rocker-bottom shoes.  He has been potential injections if necessary.  Patient's pain seems to be more on the lateral aspect of the hip.  Does not seem to be more internal.  Patient does have more limited with FABER test range of motion.  Patient wanted to try the conservative therapy.  Declined any type of injection.  Would like to consider the possibility of x-rays of the lumbar spine or the hip which patient declined today but if no significant improvement will consider it at follow-up.  Discussed over-the-counter medications.  Follow-up again in 6 to 8 weeks  Update 07/25/2021 Sabrina Tapia is a 70 y.o. female coming in with complaint of R great toe and R hip pain.  As well.  Patient states that she continues to have toe stiffness. Likes HOKA sandals in house and would like to get HOKA shoes.   R hip pain has improved. No pain with golf after resting for past 3 weeks.   L CMC joint pain for past year but worsening over past month. Using ice, IBU and Voltaren.       Past Medical History:  Diagnosis Date   Allergy-induced asthma    prn inhaler   Asthma    allergy induced   Basal cell carcinoma    right clavicle   Breast CA ([place]) dx'd 2017   left and right   Bruises easily    Constipation    Dental  crowns present    Endometrial thickening on ultrasound    Family history of adverse reaction to anesthesia    pt's father has hx. of being hard to wake up post-op   GERD (gastroesophageal reflux disease)    Hemorrhoids    History of breast cancer 2017   bilateral   History of radiation therapy    08/19/16- 09/29/16, Right Breast 50 Gy in 25 fractions, Right Breast boost 10 Gy in 5 fractions, Left Breast 50.4 Gy in 28 fractions   Hot flashes    Immature cataract    Migraines    Osteoarthritis    hands and feet   Osteopenia 02/2016   T score -1.3 FRAX 8.3%/0.7% stable from prior DEXA   Overactive bladder    PMB (postmenopausal bleeding)    Pneumonia    History of   Seasonal allergies    Thyroid nodule 2012   Solitary nodule in the lower pole of the left thyroid lobe , pt unaware   Tingling of upper extremity    Wears glasses    Past Surgical History:  Procedure Laterality Date   BREAST RECONSTRUCTION WITH PLACEMENT OF TISSUE EXPANDER AND FLEX HD (ACELLULAR HYDRATED DERMIS) Left 07/01/2016   Procedure: BREAST RECONSTRUCTION WITH PLACEMENT OF TISSUE EXPANDER AND  ALLODERM;  Surgeon: Irene Limbo, MD;  Location: [place];  Service: Plastics;  Laterality: Left;   BROW LIFT  COLONOSCOPY  2011   DILATATION & CURETTAGE/HYSTEROSCOPY WITH MYOSURE N/A 12/06/2018   Procedure: DILATATION & CURETTAGE/HYSTEROSCOPY WITH MYOSURE;  Surgeon: [name], MD;  Location: [facility];  Service: Gynecology;  Laterality: N/A;  follow 1st case   INCISION AND DRAINAGE Left 06/02/2016   Procedure: DRAINAGE of left axilla seroma;  Surgeon: Rolm Bookbinder, MD;  Location: [place];  Service: General;  Laterality: Left;   LIPOSUCTION WITH LIPOFILLING Left 06/11/2018   Procedure: Lipofilling from abdomen to left chest;  Surgeon: Irene Limbo, MD;  Location: [place];  Service: Plastics;  Laterality: Left;   MASTOPEXY Right  04/17/2017   Procedure: RIGHT MASTOPEXY;  Surgeon: Irene Limbo, MD;  Location: [place];  Service: Plastics;  Laterality: Right;   NIPPLE SPARING MASTECTOMY Left 07/01/2016   Procedure: LEFT NIPPLE SPARING MASTECTOMY;  Surgeon: Rolm Bookbinder, MD;  Location: [place];  Service: General;  Laterality: Left;   RADIOACTIVE SEED GUIDED PARTIAL MASTECTOMY WITH AXILLARY SENTINEL LYMPH NODE BIOPSY Right 03/26/2016   Procedure: RIGHT BREAST LUMPECTOMY WITH RADIOACTIVE SEED AND SENTINEL LYMPH NODE BIOPSY;  Surgeon: Rolm Bookbinder, MD;  Location: [place];  Service: General;  Laterality: Right;   RADIOACTIVE SEED GUIDED PARTIAL MASTECTOMY WITH AXILLARY SENTINEL LYMPH NODE BIOPSY Left 05/20/2016   Procedure: LEFT BREAST RADIOACTIVE SEED (two seeds) GUIDED LUMPECTOMY WITH LEFT AXILLARY SENTINEL LYMPH NODE BIOPSY AND LEFT SEED TARGETED AXILLARY LYMPH NODE EXCISION;  Surgeon: Rolm Bookbinder, MD;  Location: [place];  Service: General;  Laterality: Left;   RE-EXCISION OF BREAST CANCER,SUPERIOR MARGINS Right 05/20/2016   Procedure: RE-EXCISION OF RIGHT BREAST MARGIN;  Surgeon: Rolm Bookbinder, MD;  Location: [place];  Service: General;  Laterality: Right;   RE-EXCISION OF BREAST CANCER,SUPERIOR MARGINS Left 06/02/2016   Procedure: RE-EXCISION OF BREAST CANCER,SUPERIOR MARGINS;  Surgeon: Rolm Bookbinder, MD;  Location: [place];  Service: General;  Laterality: Left;   REMOVAL OF TISSUE EXPANDER AND PLACEMENT OF IMPLANT Left 04/17/2017   Procedure: REMOVAL OF LEFT TISSUE EXPANDER WITH PLACEMENT OF LEFT SILICONE BREAST IMPLANT;  Surgeon: Irene Limbo, MD;  Location: [place];  Service: Plastics;  Laterality: Left;   Social History   Socioeconomic History   Marital status: Single    Spouse name: Not on file   Number of children: Not on file   Years of education: Not on file    Highest education level: Not on file  Occupational History   Not on file  Tobacco Use   Smoking status: Former    Packs/day: 1.00    Years: 13.00    Pack years: 13.00    Types: Cigarettes    Quit date: 07/27/1990    Years since quitting: 31.0   Smokeless tobacco: Never  Vaping Use   Vaping Use: Never used  Substance and Sexual Activity   Alcohol use: Yes    Alcohol/week: 2.0 standard drinks    Types: 2 Glasses of wine per week   Drug use: Not Currently    Types: Marijuana   Sexual activity: Not Currently    Partners: Male    Birth control/protection: Post-menopausal    Comment: 1st intercourse 89 yo-5 partners  Other Topics Concern   Not on file  Social History Narrative   Not on file   Social Determinants of Health   Financial Resource Strain: Not on file  Food Insecurity: Not on file  Transportation Needs: Not  on file  Physical Activity: Not on file  Stress: Not on file  Social Connections: Not on file   Allergies  Allergen Reactions   Bacitracin Swelling   Hydrocodone Other (See Comments)    Restlessness, "energy"   Hydrocodone-Acetaminophen Other (See Comments)   Other     Mold    Penicillins Itching   Adhesive [Tape] Other (See Comments)    SKIN IRRITATION   Augmentin [Amoxicillin-Pot Clavulanate] Itching   Penicillamine Rash   Family History  Problem Relation Age of Onset   Hypertension Mother    Heart disease Mother    Lung disease Mother    Hypertension Father    Cancer Father        colon   Heart disease Father    Lung cancer Father    Anesthesia problems Father        hard to wake up post-op   Breast cancer Paternal Grandmother        Age 37's   Hypertension Brother    Hyperlipidemia Brother    Diabetes Maternal Grandfather      Current Outpatient Medications (Cardiovascular):    atorvastatin (LIPITOR) 10 MG tablet, 1 tablet  Current Outpatient Medications (Respiratory):    albuterol (PROAIR HFA) 108 (90 Base) MCG/ACT inhaler,  Inhale 2 puffs into the lungs every 4 (four) hours as needed for wheezing or shortness of breath.   azelastine (ASTELIN) 0.1 % nasal spray,    cetirizine (ZYRTEC) 10 MG tablet, Take 10 mg by mouth every evening.    FLOVENT HFA 110 MCG/ACT inhaler, INHALE 1 PUFF BY MOUTH INTO THE LUNGS TWICE DAILY  Current Outpatient Medications (Analgesics):    ibuprofen (ADVIL) 400 MG tablet, Take 400 mg by mouth every 6 (six) hours as needed for mild pain.   Current Outpatient Medications (Other):    Cholecalciferol (VITAMIN D) 2000 units tablet, Take 5,000 Units by mouth daily.    docusate sodium (COLACE) 50 MG capsule, Take 100 mg by mouth every evening.    hydrocortisone (ANUSOL-HC) 2.5 % rectal cream, Place 1 application rectally as needed for hemorrhoids or itching.   OVER THE COUNTER MEDICATION, Take by mouth daily. MEGASPOREBIOTIC   PRILOSEC OTC 20 MG tablet, every morning. Reported on 12/14/2015   psyllium (METAMUCIL) 58.6 % packet, Take 1 packet by mouth daily.   tamoxifen (NOLVADEX) 20 MG tablet, TAKE 1 TABLET(20 MG) BY MOUTH DAILY   triamcinolone (KENALOG) 0.1 %,    valACYclovir (VALTREX) 1000 MG tablet, Take 1 tablet (1,000 mg total) by mouth 2 (two) times daily. Take for 1 day as directed above as needed.   venlafaxine XR (EFFEXOR-XR) 75 MG 24 hr capsule, TAKE 1 CAPSULE(75 MG) BY MOUTH DAILY WITH BREAKFAST   CALCIUM-MAGNESIUM PO, Take by mouth.    Objective  Blood pressure 112/84, pulse 76, height 5\' 3"  (1.6 m), weight 165 lb (74.8 kg), last menstrual period 07/28/1996, SpO2 98 %.   General: No apparent distress alert and oriented x3 mood and affect normal, dressed appropriately.  HEENT: Pupils equal, extraocular movements intact  Respiratory: Patient's speak in full sentences and does not appear short of breath  Cardiovascular: No lower extremity edema, non tender, no erythema  Gait normal with good balance and coordination.  MSK: Patient does have swelling noted over the [facility] joint.   Positive grind test.  Negative Finkelstein's. Patient is feet are in shoes and appear to be comfortable.  Minimal tenderness noted over the right hip.  Limited muscular skeletal ultrasound was performed  and interpreted by Hulan Saas, M Limited ultrasound shows the patient does have hypoechoic changes within the [facility] joint and does have what appears to be a fairly large bone spur noted.  Dynamic testing does show that this could be contributing to some of the discomfort. Impression: CMC arthritis   Impression and Recommendations:     The above documentation has been reviewed and is accurate and complete Lyndal Pulley, DO

## 2021-07-25 ENCOUNTER — Ambulatory Visit (INDEPENDENT_AMBULATORY_CARE_PROVIDER_SITE_OTHER): Payer: Medicare PPO

## 2021-07-25 ENCOUNTER — Ambulatory Visit: Payer: Self-pay

## 2021-07-25 ENCOUNTER — Encounter: Payer: Self-pay | Admitting: Family Medicine

## 2021-07-25 ENCOUNTER — Ambulatory Visit: Payer: Medicare PPO | Admitting: Family Medicine

## 2021-07-25 ENCOUNTER — Other Ambulatory Visit: Payer: Self-pay

## 2021-07-25 VITALS — BP 112/84 | HR 76 | Ht 63.0 in | Wt 165.0 lb

## 2021-07-25 DIAGNOSIS — M79642 Pain in left hand: Secondary | ICD-10-CM

## 2021-07-25 DIAGNOSIS — M7061 Trochanteric bursitis, right hip: Secondary | ICD-10-CM | POA: Diagnosis not present

## 2021-07-25 DIAGNOSIS — M1812 Unilateral primary osteoarthritis of first carpometacarpal joint, left hand: Secondary | ICD-10-CM

## 2021-07-25 DIAGNOSIS — M25551 Pain in right hip: Secondary | ICD-10-CM

## 2021-07-25 NOTE — Assessment & Plan Note (Signed)
Improvement noted at this time.  We will continue to monitor.  Follow-up again in 6 weeks.  Worsening pain consider the possibility of injection.

## 2021-07-25 NOTE — Patient Instructions (Addendum)
HOKA Bondi or Arahi Thumb spica at night or a lot of activity Ice See me in 2 months

## 2021-07-25 NOTE — Assessment & Plan Note (Signed)
Continued arthritis.  Discussed the potential for injection which patient declined at the moment.  We will start with bracing and see how patient does.  Discussed icing regimen and home exercises.  Follow-up with me again in 6 to 8 weeks to evaluate further.

## 2021-07-28 HISTORY — PX: OTHER SURGICAL HISTORY: SHX169

## 2021-08-02 ENCOUNTER — Encounter: Payer: Self-pay | Admitting: Oncology

## 2021-08-03 DIAGNOSIS — M25512 Pain in left shoulder: Secondary | ICD-10-CM | POA: Diagnosis not present

## 2021-08-05 ENCOUNTER — Other Ambulatory Visit: Payer: Self-pay | Admitting: *Deleted

## 2021-08-05 MED ORDER — VENLAFAXINE HCL ER 75 MG PO CP24: ORAL_CAPSULE | ORAL | 4 refills | Status: DC

## 2021-08-14 DIAGNOSIS — G4733 Obstructive sleep apnea (adult) (pediatric): Secondary | ICD-10-CM | POA: Diagnosis not present

## 2021-08-14 DIAGNOSIS — M13812 Other specified arthritis, left shoulder: Secondary | ICD-10-CM | POA: Diagnosis not present

## 2021-08-22 DIAGNOSIS — I2510 Atherosclerotic heart disease of native coronary artery without angina pectoris: Secondary | ICD-10-CM | POA: Diagnosis not present

## 2021-08-22 DIAGNOSIS — I10 Essential (primary) hypertension: Secondary | ICD-10-CM | POA: Diagnosis not present

## 2021-08-22 DIAGNOSIS — J309 Allergic rhinitis, unspecified: Secondary | ICD-10-CM | POA: Diagnosis not present

## 2021-08-22 DIAGNOSIS — D0511 Intraductal carcinoma in situ of right breast: Secondary | ICD-10-CM | POA: Diagnosis not present

## 2021-08-22 DIAGNOSIS — M19019 Primary osteoarthritis, unspecified shoulder: Secondary | ICD-10-CM | POA: Diagnosis not present

## 2021-08-23 DIAGNOSIS — R0982 Postnasal drip: Secondary | ICD-10-CM | POA: Diagnosis not present

## 2021-08-23 DIAGNOSIS — J310 Chronic rhinitis: Secondary | ICD-10-CM | POA: Diagnosis not present

## 2021-08-23 DIAGNOSIS — J301 Allergic rhinitis due to pollen: Secondary | ICD-10-CM | POA: Diagnosis not present

## 2021-08-29 DIAGNOSIS — M13812 Other specified arthritis, left shoulder: Secondary | ICD-10-CM | POA: Diagnosis not present

## 2021-08-29 DIAGNOSIS — M7542 Impingement syndrome of left shoulder: Secondary | ICD-10-CM | POA: Diagnosis not present

## 2021-09-06 IMAGING — CT CT CHEST W/ CM
2 of 4 series · 15 of 36 positions shown, 18 images · IV contrast (OMNIPAQUE)
Comparison: None.

CLINICAL DATA: Breast cancer staging.  New right lung mass.

EXAM:
CT CHEST WITH CONTRAST
TECHNIQUE: Multidetector CT imaging of the chest was performed during
intravenous contrast administration.
CONTRAST:  75mL OMNIPAQUE IOHEXOL 300 MG/ML  SOLN

[Series 2: axial st · axial · 0.73mm/px · z∈[+1303,+1551]mm · 12 of 148 slices shown, 15 images]
[im 12/148  mediastinal]
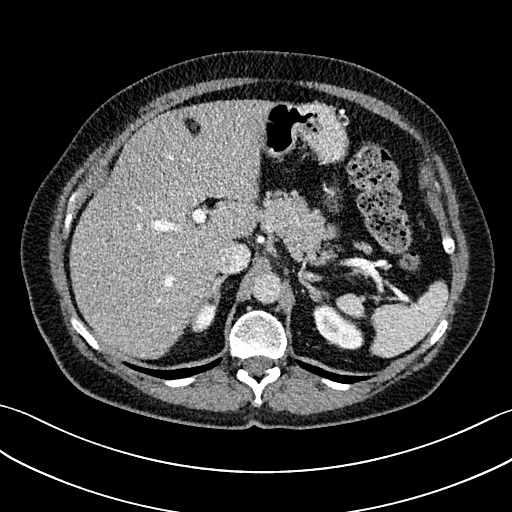
[im 12/148  lung]
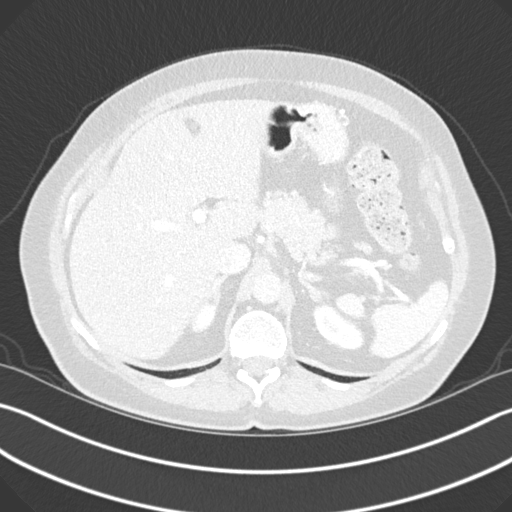
[im 23/148  lung]
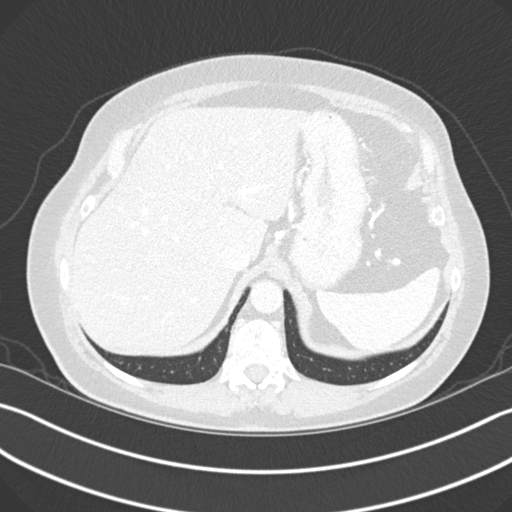
[im 34/148  lung]
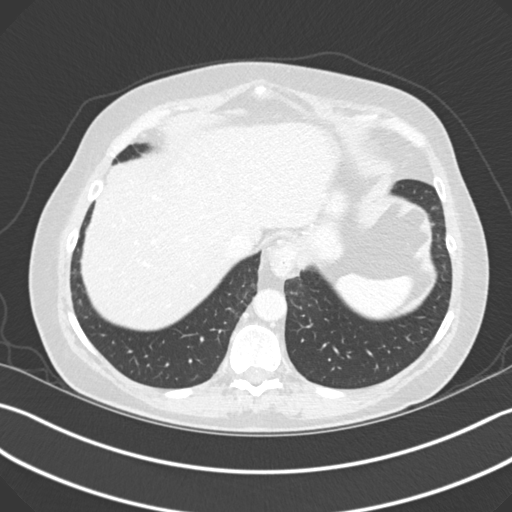
[im 46/148  lung]
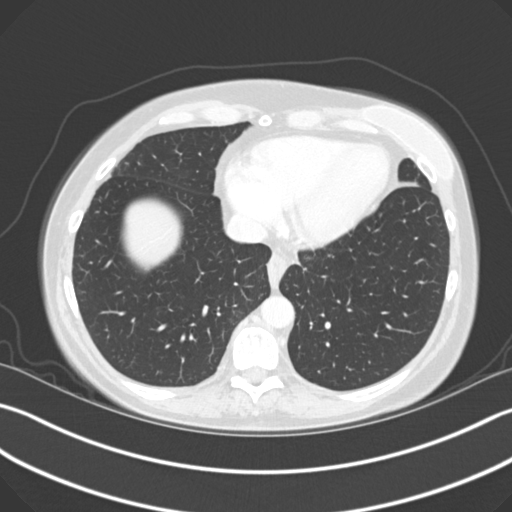
[im 57/148  mediastinal]
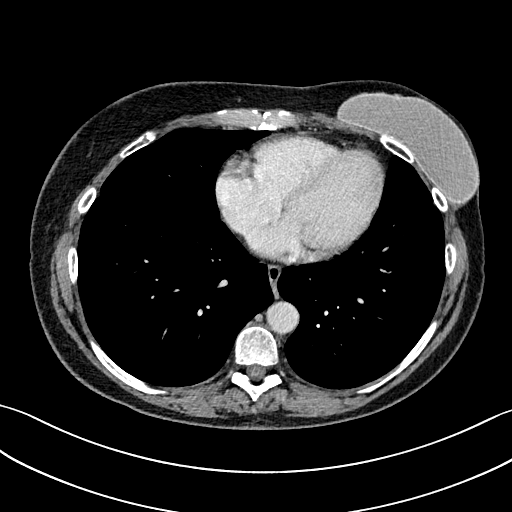
[im 57/148  lung]
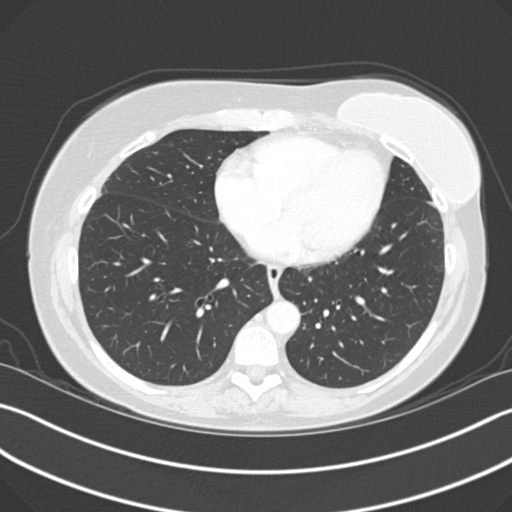
[im 68/148  lung]
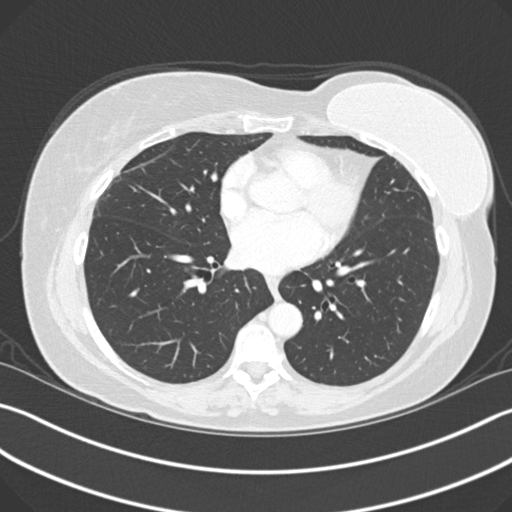
[im 80/148  lung]
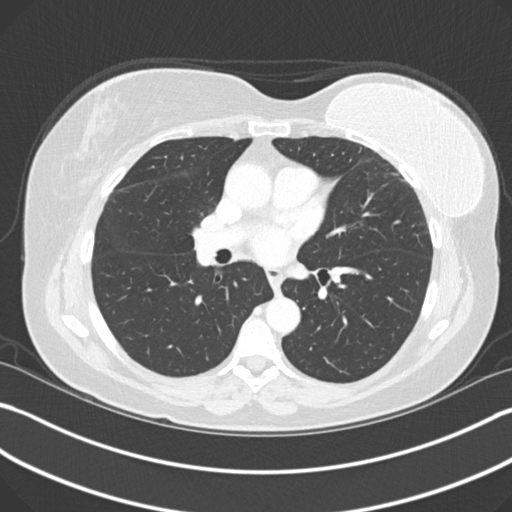
[im 91/148  lung]
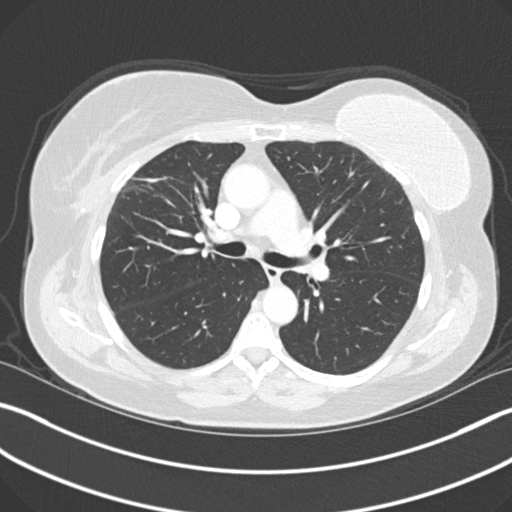
[im 102/148  mediastinal]
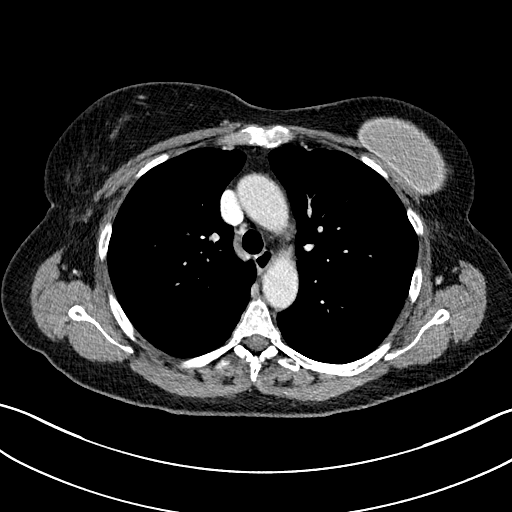
[im 102/148  lung]
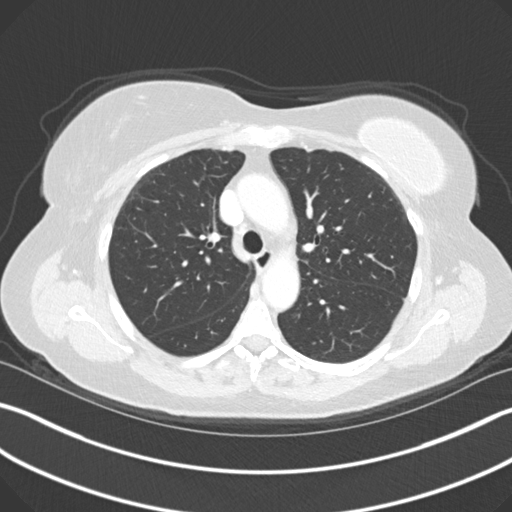
[im 114/148  lung]
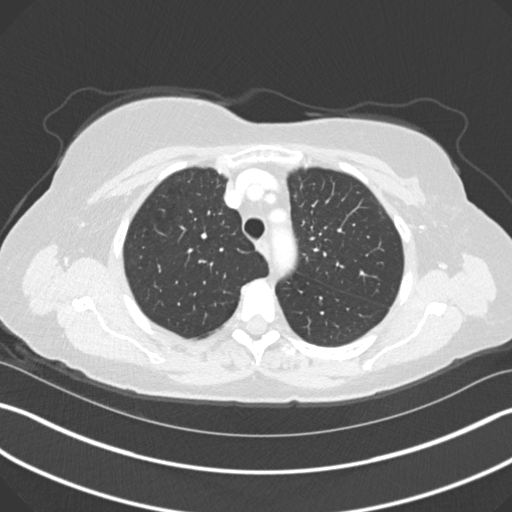
[im 125/148  lung]
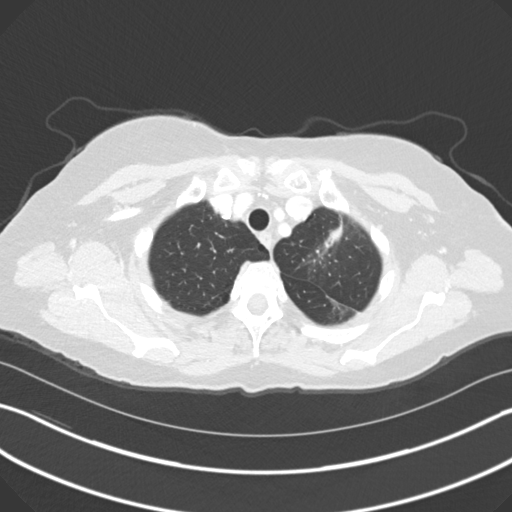
[im 136/148  lung]
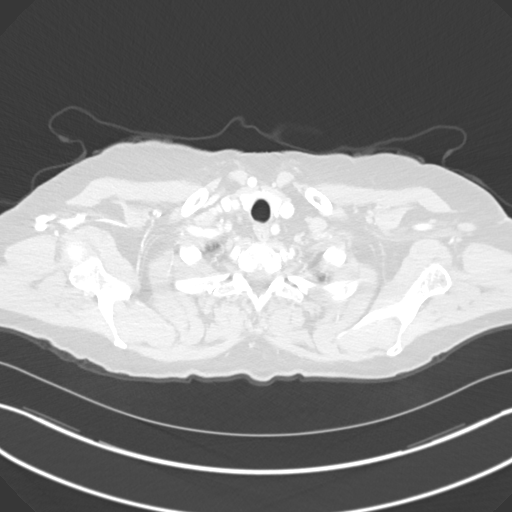

[Series 5: coronal · coronal · 0.62mm/px · 3 of 137 slices shown]
[im 28/137  lung]
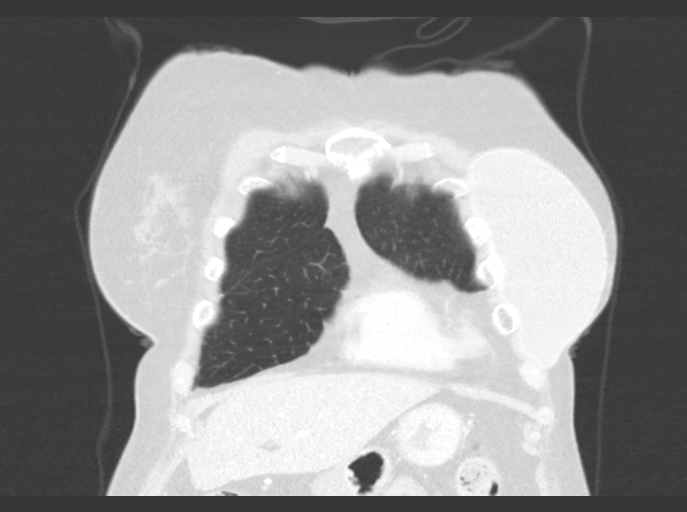
[im 55/137  lung]
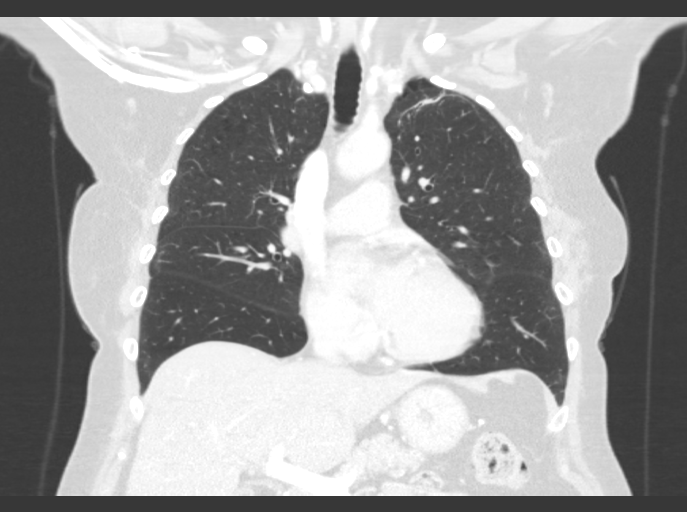
[im 82/137  lung]
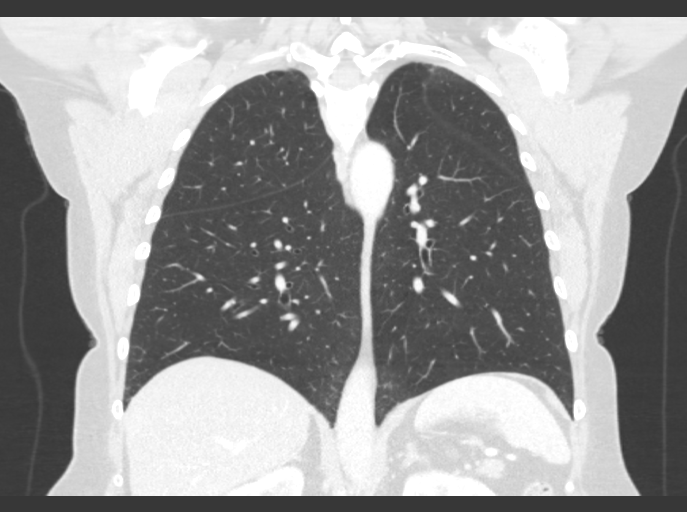

[15 of 36 positions shown; findings below may reference images not displayed]

FINDINGS: Cardiovascular: The heart size is normal. No substantial pericardial
effusion. Coronary artery calcification is evident. Atherosclerotic
calcification is noted in the wall of the thoracic aorta.

Mediastinum/Nodes: No mediastinal lymphadenopathy. No evidence for
gross hilar lymphadenopathy although assessment is limited by the
lack of intravenous contrast on today's study. The esophagus has
normal imaging features. There is no axillary lymphadenopathy.

Lungs/Pleura: Centrilobular emphsyema noted. No suspicious pulmonary
nodule or mass in the right lung. No focal consolidation or
suspicious mass lesion in the left lung. No pleural effusion.
Architectural distortion/scarring is seen in the upper lobes the
bilaterally.

Upper Abdomen: Unremarkable.

Musculoskeletal: No worrisome lytic or sclerotic osseous
abnormality.
IMPRESSION: Findings on chest x-ray of 07/10/2020 were described in the right
middle lobe but actually project over the anterior left lung base.
There is no underlying mass lesion or pneumonia in the left lung on
today's study to account for this appearance. Patient is noted to
have some subpleural reticulation/scarring in the anterior aspect of
the lower left upper lobe with prominent left pericardial fat pad.
These findings along with a left breast prosthesis that is new in
the interval between the 2688 and 07/10/2020 exams likely combine to
create the opacity identified as new on the 07/10/2020 x-ray.

## 2021-09-17 DIAGNOSIS — D2271 Melanocytic nevi of right lower limb, including hip: Secondary | ICD-10-CM | POA: Diagnosis not present

## 2021-09-17 DIAGNOSIS — Z85828 Personal history of other malignant neoplasm of skin: Secondary | ICD-10-CM | POA: Diagnosis not present

## 2021-09-17 DIAGNOSIS — Z23 Encounter for immunization: Secondary | ICD-10-CM | POA: Diagnosis not present

## 2021-09-17 DIAGNOSIS — Z86018 Personal history of other benign neoplasm: Secondary | ICD-10-CM | POA: Diagnosis not present

## 2021-09-17 DIAGNOSIS — L578 Other skin changes due to chronic exposure to nonionizing radiation: Secondary | ICD-10-CM | POA: Diagnosis not present

## 2021-09-17 DIAGNOSIS — D225 Melanocytic nevi of trunk: Secondary | ICD-10-CM | POA: Diagnosis not present

## 2021-09-17 DIAGNOSIS — L814 Other melanin hyperpigmentation: Secondary | ICD-10-CM | POA: Diagnosis not present

## 2021-09-17 DIAGNOSIS — L821 Other seborrheic keratosis: Secondary | ICD-10-CM | POA: Diagnosis not present

## 2021-09-17 DIAGNOSIS — L409 Psoriasis, unspecified: Secondary | ICD-10-CM | POA: Diagnosis not present

## 2021-09-23 DIAGNOSIS — M7542 Impingement syndrome of left shoulder: Secondary | ICD-10-CM | POA: Diagnosis not present

## 2021-09-23 DIAGNOSIS — M24112 Other articular cartilage disorders, left shoulder: Secondary | ICD-10-CM | POA: Diagnosis not present

## 2021-09-23 DIAGNOSIS — S4382XA Sprain of other specified parts of left shoulder girdle, initial encounter: Secondary | ICD-10-CM | POA: Diagnosis not present

## 2021-09-23 DIAGNOSIS — S43432A Superior glenoid labrum lesion of left shoulder, initial encounter: Secondary | ICD-10-CM | POA: Diagnosis not present

## 2021-09-23 DIAGNOSIS — M75112 Incomplete rotator cuff tear or rupture of left shoulder, not specified as traumatic: Secondary | ICD-10-CM | POA: Diagnosis not present

## 2021-09-23 DIAGNOSIS — M19012 Primary osteoarthritis, left shoulder: Secondary | ICD-10-CM | POA: Diagnosis not present

## 2021-09-23 DIAGNOSIS — M7552 Bursitis of left shoulder: Secondary | ICD-10-CM | POA: Diagnosis not present

## 2021-09-23 DIAGNOSIS — M24012 Loose body in left shoulder: Secondary | ICD-10-CM | POA: Diagnosis not present

## 2021-09-23 DIAGNOSIS — G8918 Other acute postprocedural pain: Secondary | ICD-10-CM | POA: Diagnosis not present

## 2021-09-24 ENCOUNTER — Ambulatory Visit: Payer: Medicare PPO | Admitting: Family Medicine

## 2021-10-07 ENCOUNTER — Other Ambulatory Visit: Payer: Self-pay | Admitting: *Deleted

## 2021-10-07 MED ORDER — TAMOXIFEN CITRATE 20 MG PO TABS: 20.0000 mg | ORAL_TABLET | Freq: Every day | ORAL | 3 refills | Status: DC

## 2021-10-07 NOTE — Progress Notes (Unsigned)
[place] Phone: [phone] Subjective:    I'm seeing this patient by the request  of:  Josetta Huddle, MD  CC:   [id number]  07/25/2021 Improvement noted at this time.  We will continue to monitor.  Follow-up again in 6 weeks.  Worsening pain consider the possibility of injection.  Continued arthritis.  Discussed the potential for injection which patient declined at the moment.  We will start with bracing and see how patient does.  Discussed icing regimen and home exercises.  Follow-up with me again in 6 to 8 weeks to evaluate further.  Updated 10/08/2021 Sabrina Tapia is a 71 y.o. female coming in with complaint of ***       Past Medical History:  Diagnosis Date   Allergy-induced asthma    prn inhaler   Asthma    allergy induced   Basal cell carcinoma    right clavicle   Breast CA ([place]) dx'd 2017   left and right   Bruises easily    Constipation    Dental crowns present    Endometrial thickening on ultrasound    Family history of adverse reaction to anesthesia    pt's father has hx. of being hard to wake up post-op   GERD (gastroesophageal reflux disease)    Hemorrhoids    History of breast cancer 2017   bilateral   History of radiation therapy    08/19/16- 09/29/16, Right Breast 50 Gy in 25 fractions, Right Breast boost 10 Gy in 5 fractions, Left Breast 50.4 Gy in 28 fractions   Hot flashes    Immature cataract    Migraines    Osteoarthritis    hands and feet   Osteopenia 02/2016   T score -1.3 FRAX 8.3%/0.7% stable from prior DEXA   Overactive bladder    PMB (postmenopausal bleeding)    Pneumonia    History of   Seasonal allergies    Thyroid nodule 2012   Solitary nodule in the lower pole of the left thyroid lobe , pt unaware   Tingling of upper extremity    Wears glasses    Past Surgical History:  Procedure Laterality Date   BREAST RECONSTRUCTION WITH PLACEMENT OF TISSUE  EXPANDER AND FLEX HD (ACELLULAR HYDRATED DERMIS) Left 07/01/2016   Procedure: BREAST RECONSTRUCTION WITH PLACEMENT OF TISSUE EXPANDER AND  ALLODERM;  Surgeon: Irene Limbo, MD;  Location: [place];  Service: Plastics;  Laterality: Left;   BROW LIFT     COLONOSCOPY  2011   DILATATION & CURETTAGE/HYSTEROSCOPY WITH MYOSURE N/A 12/06/2018   Procedure: DILATATION & CURETTAGE/HYSTEROSCOPY WITH MYOSURE;  Surgeon: [name], MD;  Location: [facility];  Service: Gynecology;  Laterality: N/A;  follow 1st case   INCISION AND DRAINAGE Left 06/02/2016   Procedure: DRAINAGE of left axilla seroma;  Surgeon: Rolm Bookbinder, MD;  Location: [place];  Service: General;  Laterality: Left;   LIPOSUCTION WITH LIPOFILLING Left 06/11/2018   Procedure: Lipofilling from abdomen to left chest;  Surgeon: Irene Limbo, MD;  Location: [place];  Service: Plastics;  Laterality: Left;   MASTOPEXY Right 04/17/2017   Procedure: RIGHT MASTOPEXY;  Surgeon: Irene Limbo, MD;  Location: [place];  Service: Plastics;  Laterality: Right;   NIPPLE SPARING MASTECTOMY Left 07/01/2016   Procedure: LEFT NIPPLE SPARING MASTECTOMY;  Surgeon: Rolm Bookbinder, MD;  Location: [place];  Service:  General;  Laterality: Left;   RADIOACTIVE SEED GUIDED PARTIAL MASTECTOMY WITH AXILLARY SENTINEL LYMPH NODE BIOPSY Right 03/26/2016   Procedure: RIGHT BREAST LUMPECTOMY WITH RADIOACTIVE SEED AND SENTINEL LYMPH NODE BIOPSY;  Surgeon: Rolm Bookbinder, MD;  Location: [place];  Service: General;  Laterality: Right;   RADIOACTIVE SEED GUIDED PARTIAL MASTECTOMY WITH AXILLARY SENTINEL LYMPH NODE BIOPSY Left 05/20/2016   Procedure: LEFT BREAST RADIOACTIVE SEED (two seeds) GUIDED LUMPECTOMY WITH LEFT AXILLARY SENTINEL LYMPH NODE BIOPSY AND LEFT SEED TARGETED AXILLARY LYMPH NODE EXCISION;  Surgeon: Rolm Bookbinder,  MD;  Location: [place];  Service: General;  Laterality: Left;   RE-EXCISION OF BREAST CANCER,SUPERIOR MARGINS Right 05/20/2016   Procedure: RE-EXCISION OF RIGHT BREAST MARGIN;  Surgeon: Rolm Bookbinder, MD;  Location: [place];  Service: General;  Laterality: Right;   RE-EXCISION OF BREAST CANCER,SUPERIOR MARGINS Left 06/02/2016   Procedure: RE-EXCISION OF BREAST CANCER,SUPERIOR MARGINS;  Surgeon: Rolm Bookbinder, MD;  Location: [place];  Service: General;  Laterality: Left;   REMOVAL OF TISSUE EXPANDER AND PLACEMENT OF IMPLANT Left 04/17/2017   Procedure: REMOVAL OF LEFT TISSUE EXPANDER WITH PLACEMENT OF LEFT SILICONE BREAST IMPLANT;  Surgeon: Irene Limbo, MD;  Location: [place];  Service: Plastics;  Laterality: Left;   Social History   Socioeconomic History   Marital status: Single    Spouse name: Not on file   Number of children: Not on file   Years of education: Not on file   Highest education level: Not on file  Occupational History   Not on file  Tobacco Use   Smoking status: Former    Packs/day: 1.00    Years: 13.00    Pack years: 13.00    Types: Cigarettes    Quit date: 07/27/1990    Years since quitting: 31.2   Smokeless tobacco: Never  Vaping Use   Vaping Use: Never used  Substance and Sexual Activity   Alcohol use: Yes    Alcohol/week: 2.0 standard drinks    Types: 2 Glasses of wine per week   Drug use: Not Currently    Types: Marijuana   Sexual activity: Not Currently    Partners: Male    Birth control/protection: Post-menopausal    Comment: 1st intercourse 18 yo-5 partners  Other Topics Concern   Not on file  Social History Narrative   Not on file   Social Determinants of Health   Financial Resource Strain: Not on file  Food Insecurity: Not on file  Transportation Needs: Not on file  Physical Activity: Not on file  Stress: Not on file  Social Connections: Not on file    Allergies  Allergen Reactions   Bacitracin Swelling   Hydrocodone Other (See Comments)    Restlessness, "energy"   Hydrocodone-Acetaminophen Other (See Comments)   Other     Mold    Penicillins Itching   Adhesive [Tape] Other (See Comments)    SKIN IRRITATION   Augmentin [Amoxicillin-Pot Clavulanate] Itching   Penicillamine Rash   Family History  Problem Relation Age of Onset   Hypertension Mother    Heart disease Mother    Lung disease Mother    Hypertension Father    Cancer Father        colon   Heart disease Father    Lung cancer Father    Anesthesia problems Father        hard to wake up post-op   Breast cancer Paternal Grandmother  Age 71's   Hypertension Brother    Hyperlipidemia Brother    Diabetes Maternal Grandfather      Current Outpatient Medications (Cardiovascular):    atorvastatin (LIPITOR) 10 MG tablet, 1 tablet  Current Outpatient Medications (Respiratory):    albuterol (PROAIR HFA) 108 (90 Base) MCG/ACT inhaler, Inhale 2 puffs into the lungs every 4 (four) hours as needed for wheezing or shortness of breath.   azelastine (ASTELIN) 0.1 % nasal spray,    cetirizine (ZYRTEC) 10 MG tablet, Take 10 mg by mouth every evening.    FLOVENT HFA 110 MCG/ACT inhaler, INHALE 1 PUFF BY MOUTH INTO THE LUNGS TWICE DAILY  Current Outpatient Medications (Analgesics):    ibuprofen (ADVIL) 400 MG tablet, Take 400 mg by mouth every 6 (six) hours as needed for mild pain.   Current Outpatient Medications (Other):    CALCIUM-MAGNESIUM PO, Take by mouth.   Cholecalciferol (VITAMIN D) 2000 units tablet, Take 5,000 Units by mouth daily.    docusate sodium (COLACE) 50 MG capsule, Take 100 mg by mouth every evening.    hydrocortisone (ANUSOL-HC) 2.5 % rectal cream, Place 1 application rectally as needed for hemorrhoids or itching.   OVER THE COUNTER MEDICATION, Take by mouth daily. MEGASPOREBIOTIC   PRILOSEC OTC 20 MG tablet, every morning. Reported on 12/14/2015    psyllium (METAMUCIL) 58.6 % packet, Take 1 packet by mouth daily.   tamoxifen (NOLVADEX) 20 MG tablet, Take 1 tablet (20 mg total) by mouth daily.   triamcinolone (KENALOG) 0.1 %,    valACYclovir (VALTREX) 1000 MG tablet, Take 1 tablet (1,000 mg total) by mouth 2 (two) times daily. Take for 1 day as directed above as needed.   venlafaxine XR (EFFEXOR-XR) 75 MG 24 hr capsule, TAKE 1 CAPSULE(75 MG) BY MOUTH DAILY WITH BREAKFAST   Reviewed prior external information including notes and imaging from  primary care provider As well as notes that were available from care everywhere and other healthcare systems.  Past medical history, social, surgical and family history all reviewed in electronic medical record.  No pertanent information unless stated regarding to the chief complaint.   Review of Systems:  No headache, visual changes, nausea, vomiting, diarrhea, constipation, dizziness, abdominal pain, skin rash, fevers, chills, night sweats, weight loss, swollen lymph nodes, body aches, joint swelling, chest pain, shortness of breath, mood changes. POSITIVE muscle aches  Objective  Last menstrual period 07/28/1996.   General: No apparent distress alert and oriented x3 mood and affect normal, dressed appropriately.  HEENT: Pupils equal, extraocular movements intact  Respiratory: Patient's speak in full sentences and does not appear short of breath  Cardiovascular: No lower extremity edema, non tender, no erythema  Gait normal with good balance and coordination.  MSK:  Non tender with full range of motion and good stability and symmetric strength and tone of shoulders, elbows, wrist, hip, knee and ankles bilaterally.     Impression and Recommendations:     The above documentation has been reviewed and is accurate and complete Delsa Sale

## 2021-10-08 ENCOUNTER — Ambulatory Visit: Payer: Medicare PPO | Admitting: Family Medicine

## 2021-10-08 ENCOUNTER — Other Ambulatory Visit: Payer: Self-pay

## 2021-10-08 ENCOUNTER — Encounter: Payer: Self-pay | Admitting: Family Medicine

## 2021-10-08 ENCOUNTER — Ambulatory Visit: Payer: Self-pay

## 2021-10-08 VITALS — BP 110/80 | HR 70 | Ht 63.0 in | Wt 165.0 lb

## 2021-10-08 DIAGNOSIS — M1812 Unilateral primary osteoarthritis of first carpometacarpal joint, left hand: Secondary | ICD-10-CM

## 2021-10-08 DIAGNOSIS — M79642 Pain in left hand: Secondary | ICD-10-CM

## 2021-10-08 NOTE — Assessment & Plan Note (Signed)
Chronic problem with exacerbation.  Failed all other conservative therapy.  Injection given today.  Discussed bracing at night, topical anti-inflammatories, which activities to do and which ones to avoid.  Increase activity slowly. ?

## 2021-10-08 NOTE — Patient Instructions (Signed)
Thicker grip on club  ?See me in 2-3 months ?

## 2021-10-21 DIAGNOSIS — S0990XA Unspecified injury of head, initial encounter: Secondary | ICD-10-CM | POA: Diagnosis not present

## 2021-11-07 DIAGNOSIS — R531 Weakness: Secondary | ICD-10-CM | POA: Diagnosis not present

## 2021-11-07 DIAGNOSIS — M25612 Stiffness of left shoulder, not elsewhere classified: Secondary | ICD-10-CM | POA: Diagnosis not present

## 2021-11-07 DIAGNOSIS — K921 Melena: Secondary | ICD-10-CM | POA: Diagnosis not present

## 2021-11-07 DIAGNOSIS — K6289 Other specified diseases of anus and rectum: Secondary | ICD-10-CM | POA: Diagnosis not present

## 2021-11-07 DIAGNOSIS — K649 Unspecified hemorrhoids: Secondary | ICD-10-CM | POA: Diagnosis not present

## 2021-11-21 DIAGNOSIS — M25612 Stiffness of left shoulder, not elsewhere classified: Secondary | ICD-10-CM | POA: Diagnosis not present

## 2021-11-21 DIAGNOSIS — R531 Weakness: Secondary | ICD-10-CM | POA: Diagnosis not present

## 2021-11-25 DIAGNOSIS — H5202 Hypermetropia, left eye: Secondary | ICD-10-CM | POA: Diagnosis not present

## 2021-11-25 DIAGNOSIS — H18413 Arcus senilis, bilateral: Secondary | ICD-10-CM | POA: Diagnosis not present

## 2021-11-25 DIAGNOSIS — H04123 Dry eye syndrome of bilateral lacrimal glands: Secondary | ICD-10-CM | POA: Diagnosis not present

## 2021-11-25 DIAGNOSIS — I10 Essential (primary) hypertension: Secondary | ICD-10-CM | POA: Diagnosis not present

## 2021-11-27 DIAGNOSIS — M25612 Stiffness of left shoulder, not elsewhere classified: Secondary | ICD-10-CM | POA: Diagnosis not present

## 2021-11-27 DIAGNOSIS — R531 Weakness: Secondary | ICD-10-CM | POA: Diagnosis not present

## 2021-12-02 DIAGNOSIS — M25612 Stiffness of left shoulder, not elsewhere classified: Secondary | ICD-10-CM | POA: Diagnosis not present

## 2021-12-02 DIAGNOSIS — R531 Weakness: Secondary | ICD-10-CM | POA: Diagnosis not present

## 2021-12-06 DIAGNOSIS — M25612 Stiffness of left shoulder, not elsewhere classified: Secondary | ICD-10-CM | POA: Diagnosis not present

## 2021-12-06 DIAGNOSIS — R531 Weakness: Secondary | ICD-10-CM | POA: Diagnosis not present

## 2021-12-09 DIAGNOSIS — M25612 Stiffness of left shoulder, not elsewhere classified: Secondary | ICD-10-CM | POA: Diagnosis not present

## 2021-12-09 DIAGNOSIS — R531 Weakness: Secondary | ICD-10-CM | POA: Diagnosis not present

## 2021-12-12 DIAGNOSIS — M25612 Stiffness of left shoulder, not elsewhere classified: Secondary | ICD-10-CM | POA: Diagnosis not present

## 2021-12-12 DIAGNOSIS — R531 Weakness: Secondary | ICD-10-CM | POA: Diagnosis not present

## 2021-12-17 DIAGNOSIS — Z03818 Encounter for observation for suspected exposure to other biological agents ruled out: Secondary | ICD-10-CM | POA: Diagnosis not present

## 2021-12-17 DIAGNOSIS — R509 Fever, unspecified: Secondary | ICD-10-CM | POA: Diagnosis not present

## 2021-12-17 DIAGNOSIS — R059 Cough, unspecified: Secondary | ICD-10-CM | POA: Diagnosis not present

## 2021-12-17 DIAGNOSIS — J069 Acute upper respiratory infection, unspecified: Secondary | ICD-10-CM | POA: Diagnosis not present

## 2021-12-26 DIAGNOSIS — K6289 Other specified diseases of anus and rectum: Secondary | ICD-10-CM | POA: Diagnosis not present

## 2021-12-26 DIAGNOSIS — K649 Unspecified hemorrhoids: Secondary | ICD-10-CM | POA: Diagnosis not present

## 2021-12-26 DIAGNOSIS — K921 Melena: Secondary | ICD-10-CM | POA: Diagnosis not present

## 2022-01-23 ENCOUNTER — Ambulatory Visit: Payer: Medicare PPO | Admitting: Family Medicine

## 2022-01-23 ENCOUNTER — Encounter: Payer: Self-pay | Admitting: Family Medicine

## 2022-01-23 ENCOUNTER — Ambulatory Visit: Payer: Self-pay

## 2022-01-23 ENCOUNTER — Ambulatory Visit (INDEPENDENT_AMBULATORY_CARE_PROVIDER_SITE_OTHER): Payer: Medicare PPO

## 2022-01-23 VITALS — BP 132/80 | HR 102 | Ht 63.0 in | Wt 163.0 lb

## 2022-01-23 DIAGNOSIS — M23203 Derangement of unspecified medial meniscus due to old tear or injury, right knee: Secondary | ICD-10-CM

## 2022-01-23 DIAGNOSIS — M21611 Bunion of right foot: Secondary | ICD-10-CM

## 2022-01-23 DIAGNOSIS — M25561 Pain in right knee: Secondary | ICD-10-CM

## 2022-01-23 DIAGNOSIS — M1711 Unilateral primary osteoarthritis, right knee: Secondary | ICD-10-CM | POA: Diagnosis not present

## 2022-01-23 DIAGNOSIS — M23205 Derangement of unspecified medial meniscus due to old tear or injury, unspecified knee: Secondary | ICD-10-CM | POA: Insufficient documentation

## 2022-01-23 NOTE — Patient Instructions (Addendum)
Good to see you! Injection in knee today [facility] Do prescribed exercises at least 3x a week Xrays today See you again in 6-8 weeks

## 2022-01-23 NOTE — Assessment & Plan Note (Signed)
Cannot have the new problem.  I believe that there is acute swelling in the knee that could have contributed to more of a mild displacement of the meniscus causing more of the discomfort.  Discussed which activities to do and which ones to avoid otherwise.  Patient hopefully will respond to the injection Patient wanted to do today.  We discussed worsening symptoms do need to consider the possibility of advanced imaging.  X-rays are pending at this time.  Follow-up again in 6 to 8 weeks.

## 2022-01-23 NOTE — Progress Notes (Signed)
Sabrina Tapia [place] [address] [place] Phone: [phone] Subjective:   [name], am serving as a scribe for Dr. Hulan Saas.   I'm seeing this patient by the request  of:  Josetta Huddle, MD  CC: Knee and toe pain  [id number]  Sabrina Tapia is a 71 y.o. female coming in with complaint of knee and toe pain. Right knee swelling on medial side. Playing a lot of golf and doing a lot of walking. Putting weight on it causes pain. Right foot feels like 2nd toe is getting longer. Seen an ortho about it       Past Medical History:  Diagnosis Date   Allergy-induced asthma    prn inhaler   Asthma    allergy induced   Basal cell carcinoma    right clavicle   Breast CA ([place]) dx'd 2017   left and right   Bruises easily    Constipation    Dental crowns present    Endometrial thickening on ultrasound    Family history of adverse reaction to anesthesia    pt's father has hx. of being hard to wake up post-op   GERD (gastroesophageal reflux disease)    Hemorrhoids    History of breast cancer 2017   bilateral   History of radiation therapy    08/19/16- 09/29/16, Right Breast 50 Gy in 25 fractions, Right Breast boost 10 Gy in 5 fractions, Left Breast 50.4 Gy in 28 fractions   Hot flashes    Immature cataract    Migraines    Osteoarthritis    hands and feet   Osteopenia 02/2016   T score -1.3 FRAX 8.3%/0.7% stable from prior DEXA   Overactive bladder    PMB (postmenopausal bleeding)    Pneumonia    History of   Seasonal allergies    Thyroid nodule 2012   Solitary nodule in the lower pole of the left thyroid lobe , pt unaware   Tingling of upper extremity    Wears glasses    Past Surgical History:  Procedure Laterality Date   BREAST RECONSTRUCTION WITH PLACEMENT OF TISSUE EXPANDER AND FLEX HD (ACELLULAR HYDRATED DERMIS) Left 07/01/2016   Procedure: BREAST RECONSTRUCTION WITH PLACEMENT OF TISSUE EXPANDER AND  ALLODERM;   Surgeon: Irene Limbo, MD;  Location: [place];  Service: Plastics;  Laterality: Left;   BROW LIFT     COLONOSCOPY  2011   DILATATION & CURETTAGE/HYSTEROSCOPY WITH MYOSURE N/A 12/06/2018   Procedure: DILATATION & CURETTAGE/HYSTEROSCOPY WITH MYOSURE;  Surgeon: [name], MD;  Location: [facility];  Service: Gynecology;  Laterality: N/A;  follow 1st case   INCISION AND DRAINAGE Left 06/02/2016   Procedure: DRAINAGE of left axilla seroma;  Surgeon: Rolm Bookbinder, MD;  Location: [place];  Service: General;  Laterality: Left;   LIPOSUCTION WITH LIPOFILLING Left 06/11/2018   Procedure: Lipofilling from abdomen to left chest;  Surgeon: Irene Limbo, MD;  Location: [place];  Service: Plastics;  Laterality: Left;   MASTOPEXY Right 04/17/2017   Procedure: RIGHT MASTOPEXY;  Surgeon: Irene Limbo, MD;  Location: [place];  Service: Plastics;  Laterality: Right;   NIPPLE SPARING MASTECTOMY Left 07/01/2016   Procedure: LEFT NIPPLE SPARING MASTECTOMY;  Surgeon: Rolm Bookbinder, MD;  Location: [place];  Service: General;  Laterality: Left;   RADIOACTIVE SEED GUIDED PARTIAL MASTECTOMY WITH AXILLARY SENTINEL LYMPH NODE BIOPSY Right 03/26/2016  Procedure: RIGHT BREAST LUMPECTOMY WITH RADIOACTIVE SEED AND SENTINEL LYMPH NODE BIOPSY;  Surgeon: Rolm Bookbinder, MD;  Location: [place];  Service: General;  Laterality: Right;   RADIOACTIVE SEED GUIDED PARTIAL MASTECTOMY WITH AXILLARY SENTINEL LYMPH NODE BIOPSY Left 05/20/2016   Procedure: LEFT BREAST RADIOACTIVE SEED (two seeds) GUIDED LUMPECTOMY WITH LEFT AXILLARY SENTINEL LYMPH NODE BIOPSY AND LEFT SEED TARGETED AXILLARY LYMPH NODE EXCISION;  Surgeon: Rolm Bookbinder, MD;  Location: [place];  Service: General;  Laterality: Left;   RE-EXCISION OF BREAST CANCER,SUPERIOR MARGINS Right 05/20/2016    Procedure: RE-EXCISION OF RIGHT BREAST MARGIN;  Surgeon: Rolm Bookbinder, MD;  Location: [place];  Service: General;  Laterality: Right;   RE-EXCISION OF BREAST CANCER,SUPERIOR MARGINS Left 06/02/2016   Procedure: RE-EXCISION OF BREAST CANCER,SUPERIOR MARGINS;  Surgeon: Rolm Bookbinder, MD;  Location: [place];  Service: General;  Laterality: Left;   REMOVAL OF TISSUE EXPANDER AND PLACEMENT OF IMPLANT Left 04/17/2017   Procedure: REMOVAL OF LEFT TISSUE EXPANDER WITH PLACEMENT OF LEFT SILICONE BREAST IMPLANT;  Surgeon: Irene Limbo, MD;  Location: [place];  Service: Plastics;  Laterality: Left;   Social History   Socioeconomic History   Marital status: Single    Spouse name: Not on file   Number of children: Not on file   Years of education: Not on file   Highest education level: Not on file  Occupational History   Not on file  Tobacco Use   Smoking status: Former    Packs/day: 1.00    Years: 13.00    Total pack years: 13.00    Types: Cigarettes    Quit date: 07/27/1990    Years since quitting: 31.5   Smokeless tobacco: Never  Vaping Use   Vaping Use: Never used  Substance and Sexual Activity   Alcohol use: Yes    Alcohol/week: 2.0 standard drinks of alcohol    Types: 2 Glasses of wine per week   Drug use: Not Currently    Types: Marijuana   Sexual activity: Not Currently    Partners: Male    Birth control/protection: Post-menopausal    Comment: 1st intercourse 84 yo-5 partners  Other Topics Concern   Not on file  Social History Narrative   Not on file   Social Determinants of Health   Financial Resource Strain: Not on file  Food Insecurity: Not on file  Transportation Needs: Not on file  Physical Activity: Not on file  Stress: Not on file  Social Connections: Not on file   Allergies  Allergen Reactions   Bacitracin Swelling   Hydrocodone Other (See Comments)    Restlessness, "energy"    Hydrocodone-Acetaminophen Other (See Comments)   Other     Mold    Penicillins Itching   Adhesive [Tape] Other (See Comments)    SKIN IRRITATION   Augmentin [Amoxicillin-Pot Clavulanate] Itching   Penicillamine Rash   Family History  Problem Relation Age of Onset   Hypertension Mother    Heart disease Mother    Lung disease Mother    Hypertension Father    Cancer Father        colon   Heart disease Father    Lung cancer Father    Anesthesia problems Father        hard to wake up post-op   Breast cancer Paternal Grandmother        Age 93's   Hypertension Brother    Hyperlipidemia Brother  Diabetes Maternal Grandfather      Current Outpatient Medications (Cardiovascular):    atorvastatin (LIPITOR) 10 MG tablet, 1 tablet  Current Outpatient Medications (Respiratory):    albuterol (PROAIR HFA) 108 (90 Base) MCG/ACT inhaler, Inhale 2 puffs into the lungs every 4 (four) hours as needed for wheezing or shortness of breath.   azelastine (ASTELIN) 0.1 % nasal spray,    cetirizine (ZYRTEC) 10 MG tablet, Take 10 mg by mouth every evening.    FLOVENT HFA 110 MCG/ACT inhaler, INHALE 1 PUFF BY MOUTH INTO THE LUNGS TWICE DAILY  Current Outpatient Medications (Analgesics):    ibuprofen (ADVIL) 400 MG tablet, Take 400 mg by mouth every 6 (six) hours as needed for mild pain.   Current Outpatient Medications (Other):    CALCIUM-MAGNESIUM PO, Take by mouth.   Cholecalciferol (VITAMIN D) 2000 units tablet, Take 5,000 Units by mouth daily.    docusate sodium (COLACE) 50 MG capsule, Take 100 mg by mouth every evening.    hydrocortisone (ANUSOL-HC) 2.5 % rectal cream, Place 1 application rectally as needed for hemorrhoids or itching.   OVER THE COUNTER MEDICATION, Take by mouth daily. MEGASPOREBIOTIC   PRILOSEC OTC 20 MG tablet, every morning. Reported on 12/14/2015   psyllium (METAMUCIL) 58.6 % packet, Take 1 packet by mouth daily.   tamoxifen (NOLVADEX) 20 MG tablet, Take 1 tablet (20  mg total) by mouth daily.   triamcinolone (KENALOG) 0.1 %,    valACYclovir (VALTREX) 1000 MG tablet, Take 1 tablet (1,000 mg total) by mouth 2 (two) times daily. Take for 1 day as directed above as needed.   venlafaxine XR (EFFEXOR-XR) 75 MG 24 hr capsule, TAKE 1 CAPSULE(75 MG) BY MOUTH DAILY WITH BREAKFAST   Reviewed prior external information including notes and imaging from  primary care provider As well as notes that were available from care everywhere and other healthcare systems.  Past medical history, social, surgical and family history all reviewed in electronic medical record.  No pertanent information unless stated regarding to the chief complaint.   Review of Systems:  No headache, visual changes, nausea, vomiting, diarrhea, constipation, dizziness, abdominal pain, skin rash, fevers, chills, night sweats, weight loss, swollen lymph nodes, body aches, joint swelling, chest pain, shortness of breath, mood changes. POSITIVE muscle aches  Objective  Blood pressure 132/80, pulse (!) 102, height '5\' 3"'$  (1.6 m), weight 163 lb (73.9 kg), last menstrual period 07/28/1996, SpO2 97 %.   General: No apparent distress alert and oriented x3 mood and affect normal, dressed appropriately.  HEENT: Pupils equal, extraocular movements intact  Respiratory: Patient's speak in full sentences and does not appear short of breath  Cardiovascular: No lower extremity edema, non tender, no erythema  Right knee does have a trace effusion noted.  Does have some crepitus noted.  Patient does have tenderness to palpation over the medial joint line. Foot exam shows breakdown of the transverse arch noted.  Patient does have splaying between the first and second toes.  Patient does have bunion and bunionette formation noted.   Limited muscular skeletal ultrasound was performed and interpreted by Hulan Saas, M   Limited ultrasound of patient's knee shows hypoechoic changes of the patellofemoral joint that is  consistent with a small effusion noted.  Degenerative tearing noted of the medial meniscus appreciated.  Patient does have what appears to be a hypoechoic structure that is consistent with a parameniscal cyst on the medial aspect of the knee. Impression: Degenerative meniscal tear with a parameniscal cyst medial  After informed written and verbal consent, patient was seated on exam table. Right knee was prepped with alcohol swab and utilizing anterolateral approach, patient's right knee space was injected with 4:1  marcaine 0.5%: Kenalog '40mg'$ /dL. Patient tolerated the procedure well without immediate complications.   Impression and Recommendations:

## 2022-01-23 NOTE — Assessment & Plan Note (Signed)
Breakdown of the transverse arch discussed over-the-counter orthotics.  Recovery sandals that I think will be beneficial.  Discussed avoiding certain activities.  Follow-up with me in 6 to 8 weeks otherwise

## 2022-02-18 DIAGNOSIS — E7800 Pure hypercholesterolemia, unspecified: Secondary | ICD-10-CM | POA: Diagnosis not present

## 2022-02-18 DIAGNOSIS — M19019 Primary osteoarthritis, unspecified shoulder: Secondary | ICD-10-CM | POA: Diagnosis not present

## 2022-02-18 DIAGNOSIS — G4700 Insomnia, unspecified: Secondary | ICD-10-CM | POA: Diagnosis not present

## 2022-02-18 DIAGNOSIS — G4730 Sleep apnea, unspecified: Secondary | ICD-10-CM | POA: Diagnosis not present

## 2022-02-18 DIAGNOSIS — I700 Atherosclerosis of aorta: Secondary | ICD-10-CM | POA: Diagnosis not present

## 2022-02-18 DIAGNOSIS — J309 Allergic rhinitis, unspecified: Secondary | ICD-10-CM | POA: Diagnosis not present

## 2022-02-18 DIAGNOSIS — I10 Essential (primary) hypertension: Secondary | ICD-10-CM | POA: Diagnosis not present

## 2022-02-18 DIAGNOSIS — I2510 Atherosclerotic heart disease of native coronary artery without angina pectoris: Secondary | ICD-10-CM | POA: Diagnosis not present

## 2022-02-18 DIAGNOSIS — D0511 Intraductal carcinoma in situ of right breast: Secondary | ICD-10-CM | POA: Diagnosis not present

## 2022-02-19 DIAGNOSIS — M2021 Hallux rigidus, right foot: Secondary | ICD-10-CM | POA: Diagnosis not present

## 2022-02-19 DIAGNOSIS — J343 Hypertrophy of nasal turbinates: Secondary | ICD-10-CM | POA: Diagnosis not present

## 2022-02-19 DIAGNOSIS — J301 Allergic rhinitis due to pollen: Secondary | ICD-10-CM | POA: Diagnosis not present

## 2022-02-21 ENCOUNTER — Ambulatory Visit: Payer: Medicare PPO | Admitting: Family Medicine

## 2022-02-27 ENCOUNTER — Ambulatory Visit: Payer: Medicare PPO | Admitting: Radiology

## 2022-02-27 ENCOUNTER — Encounter: Payer: Self-pay | Admitting: Radiology

## 2022-02-27 VITALS — BP 122/76

## 2022-02-27 DIAGNOSIS — R350 Frequency of micturition: Secondary | ICD-10-CM

## 2022-02-27 DIAGNOSIS — R1032 Left lower quadrant pain: Secondary | ICD-10-CM

## 2022-02-27 LAB — URINALYSIS, COMPLETE
Bacteria, UA: NONE SEEN /HPF
Bilirubin Urine: NEGATIVE
Casts: NONE SEEN /LPF
Crystals: NONE SEEN /HPF
Glucose, UA: NEGATIVE
Hgb urine dipstick: NEGATIVE
Hyaline Cast: NONE SEEN /LPF
Ketones, ur: NEGATIVE
Leukocytes,Ua: NEGATIVE
Nitrite: NEGATIVE
Protein, ur: NEGATIVE
RBC / HPF: NONE SEEN /HPF (ref 0–2)
Specific Gravity, Urine: 1.002 (ref 1.001–1.035)
WBC, UA: NONE SEEN /HPF (ref 0–5)
Yeast: NONE SEEN /HPF
pH: 6 (ref 5.0–8.0)

## 2022-02-27 NOTE — Progress Notes (Signed)
      SUBJECTIVE: Sabrina Tapia is a 71 y.o. female who complains of lower pelvic discomfort started 02/24/22, more prominent in the LLQ. She also noticed orange colored urine yesterday, today has noticed urinary frequency and urgency, no dysuria, no vaginal symptoms. Hx of Tamoxifen use. Denies any PMB, hx of D&C 2020      02/27/2022    3:59 PM 01/23/2022    2:19 PM 10/08/2021   12:47 PM  Vitals with BMI  Height  '5\' 3"'$  '5\' 3"'$   Weight  163 lbs 165 lbs  BMI  40.98 11.91  Systolic 478 295 621  Diastolic 76 80 80  Pulse  102 70    Past Medical History:  Diagnosis Date   Allergy-induced asthma    prn inhaler   Asthma    allergy induced   Basal cell carcinoma    right clavicle   Breast CA ([place]) dx'd 2017   left and right   Bruises easily    Constipation    Dental crowns present    Endometrial thickening on ultrasound    Family history of adverse reaction to anesthesia    pt's father has hx. of being hard to wake up post-op   GERD (gastroesophageal reflux disease)    Hemorrhoids    History of breast cancer 2017   bilateral   History of radiation therapy    08/19/16- 09/29/16, Right Breast 50 Gy in 25 fractions, Right Breast boost 10 Gy in 5 fractions, Left Breast 50.4 Gy in 28 fractions   Hot flashes    Immature cataract    Migraines    Osteoarthritis    hands and feet   Osteopenia 02/2016   T score -1.3 FRAX 8.3%/0.7% stable from prior DEXA   Overactive bladder    PMB (postmenopausal bleeding)    Pneumonia    History of   Seasonal allergies    Thyroid nodule 2012   Solitary nodule in the lower pole of the left thyroid lobe , pt unaware   Tingling of upper extremity    Wears glasses      OBJECTIVE: Appears well, in no apparent distress.  Vital signs are normal. The abdomen is soft without  guarding, mass, rebound or organomegaly. + LLQ pain with palpation. No CVA tenderness or inguinal adenopathy noted. Urine dipstick shows negative for all components.    Chaperone  offered and declined for exam.  ASSESSMENT/PLAN:  1. Urinary frequency  - Urinalysis, Complete - Urine Culture  2. LLQ pain  - US Transvaginal Non-OB; Future   Will send urine culture and sensitivity.  Treatment per orders - also push fluids, avoid bladder irritants. Call or return to clinic prn if these symptoms worsen or fail to improve as anticipated.

## 2022-02-28 ENCOUNTER — Telehealth: Payer: Self-pay | Admitting: *Deleted

## 2022-02-28 LAB — URINE CULTURE
MICRO NUMBER:: 13732136
Result:: NO GROWTH
SPECIMEN QUALITY:: ADEQUATE

## 2022-02-28 NOTE — Telephone Encounter (Signed)
Rosemarie Ax was able to get patient in on 03/05/22 here for ultrasound.

## 2022-02-28 NOTE — Telephone Encounter (Signed)
-----   Message from [facility] sent at 02/27/2022  4:41 PM EDT ----- Per JC needs u/s.  Next available appt is not until September and pt is requesting to have it sooner.  Please advise.

## 2022-02-28 NOTE — Telephone Encounter (Signed)
Jami see the below, can patient go to [place]?

## 2022-03-05 ENCOUNTER — Telehealth: Payer: Self-pay

## 2022-03-05 ENCOUNTER — Ambulatory Visit (INDEPENDENT_AMBULATORY_CARE_PROVIDER_SITE_OTHER): Payer: Medicare PPO | Admitting: Obstetrics and Gynecology

## 2022-03-05 ENCOUNTER — Ambulatory Visit: Payer: Medicare PPO | Admitting: Obstetrics and Gynecology

## 2022-03-05 ENCOUNTER — Ambulatory Visit (INDEPENDENT_AMBULATORY_CARE_PROVIDER_SITE_OTHER): Payer: Medicare PPO

## 2022-03-05 VITALS — BP 140/80

## 2022-03-05 DIAGNOSIS — R1032 Left lower quadrant pain: Secondary | ICD-10-CM | POA: Diagnosis not present

## 2022-03-05 DIAGNOSIS — R9389 Abnormal findings on diagnostic imaging of other specified body structures: Secondary | ICD-10-CM

## 2022-03-05 DIAGNOSIS — Z79810 Long term (current) use of selective estrogen receptor modulators (SERMs): Secondary | ICD-10-CM | POA: Diagnosis not present

## 2022-03-05 DIAGNOSIS — R102 Pelvic and perineal pain: Secondary | ICD-10-CM

## 2022-03-05 DIAGNOSIS — N882 Stricture and stenosis of cervix uteri: Secondary | ICD-10-CM | POA: Diagnosis not present

## 2022-03-05 NOTE — Telephone Encounter (Signed)
Patient called to question if she needs full bladder for transvag u/s today. Advised she does not if not instructed by our appt desk at the time she scheduled the appointment.

## 2022-03-05 NOTE — Progress Notes (Unsigned)
GYNECOLOGY  VISIT   HPI: 71 y.o.   Single  Caucasian  female   G0P0 with Patient's last menstrual period was 07/28/1996 (approximate).   here for   ultrasound check.  Complains of pelvic pain starting 10 days ago.   No vaginal bleeding.   Has some constipation and diarrhea.   GYNECOLOGIC HISTORY: Patient's last menstrual period was 07/28/1996 (approximate). Contraception:  n/a Menopausal hormone therapy:  none Last mammogram:  07/11/21 Last pap smear:   07/02/21 negative        OB History     Gravida  0   Para      Term      Preterm      AB      Living         SAB      IAB      Ectopic      Multiple      Live Births                 Patient Active Problem List   Diagnosis Date Noted   Degenerative tear of medial meniscus 01/23/2022   Greater trochanteric bursitis of right hip 06/11/2021   Arthritis of carpometacarpal (CMC) joint of left thumb 06/11/2021   Mass of right lung 07/13/2020   Arthritis 08/03/2019   Rosacea 08/03/2019   Pes anserinus bursitis of right knee 03/28/2019   Pain in right knee 03/22/2019   Adhesive capsulitis of right shoulder 10/18/2018   Pain in right hand 10/05/2018   Tear of biceps tendon 09/21/2018   Moderate persistent asthma, uncomplicated 81/27/5170   Hallux rigidus of right foot 06/15/2017   Left Achilles tendinitis 06/15/2017   History of bilateral breast cancer 04/23/2017   Mild persistent asthma, uncomplicated 01/74/9449   Other seasonal allergic rhinitis 02/12/2017   History of therapeutic radiation 02/11/2017   Malignant neoplasm of overlapping sites of left breast in female, estrogen receptor positive ([place]) 10/27/2016   Sinusitis 09/01/2016   Hyponatremia 09/01/2016   Acquired absence of left breast 07/16/2016   Malignant neoplasm of upper-outer quadrant of right breast in female, estrogen receptor positive ([place]) 04/19/2016   Dizziness 02/19/2016   Sensorineural hearing loss (SNHL), bilateral 02/19/2016    Tinnitus of both ears 02/19/2016   Bunion of great toe of right foot 04/04/2015   Hemorrhoid 11/23/2014   Anal skin tag 11/23/2014   Incontinence of urine 07/28/2014   Retrocalcaneal bursitis 05/11/2014   Pain in joint, ankle and foot 05/11/2014   Heart burn    Joint pain    Abnormal Pap smear    Osteopenia     Past Medical History:  Diagnosis Date   Allergy-induced asthma    prn inhaler   Asthma    allergy induced   Basal cell carcinoma    right clavicle   Breast CA ([place]) dx'd 2017   left and right   Bruises easily    Constipation    Dental crowns present    Endometrial thickening on ultrasound    Family history of adverse reaction to anesthesia    pt's father has hx. of being hard to wake up post-op   GERD (gastroesophageal reflux disease)    Hemorrhoids    History of breast cancer 2017   bilateral   History of radiation therapy    08/19/16- 09/29/16, Right Breast 50 Gy in 25 fractions, Right Breast boost 10 Gy in 5 fractions, Left Breast 50.4 Gy in 28 fractions   Hot flashes  Immature cataract    Migraines    Osteoarthritis    hands and feet   Osteopenia 02/2016   T score -1.3 FRAX 8.3%/0.7% stable from prior DEXA   Overactive bladder    PMB (postmenopausal bleeding)    Pneumonia    History of   Seasonal allergies    Thyroid nodule 2012   Solitary nodule in the lower pole of the left thyroid lobe , pt unaware   Tingling of upper extremity    Wears glasses     Past Surgical History:  Procedure Laterality Date   BREAST RECONSTRUCTION WITH PLACEMENT OF TISSUE EXPANDER AND FLEX HD (ACELLULAR HYDRATED DERMIS) Left 07/01/2016   Procedure: BREAST RECONSTRUCTION WITH PLACEMENT OF TISSUE EXPANDER AND  ALLODERM;  Surgeon: Irene Limbo, MD;  Location: [place];  Service: Plastics;  Laterality: Left;   BROW LIFT     COLONOSCOPY  2011   DILATATION & CURETTAGE/HYSTEROSCOPY WITH MYOSURE N/A 12/06/2018   Procedure: DILATATION & CURETTAGE/HYSTEROSCOPY  WITH MYOSURE;  Surgeon: [name], MD;  Location: [facility];  Service: Gynecology;  Laterality: N/A;  follow 1st case   INCISION AND DRAINAGE Left 06/02/2016   Procedure: DRAINAGE of left axilla seroma;  Surgeon: Rolm Bookbinder, MD;  Location: [place];  Service: General;  Laterality: Left;   LIPOSUCTION WITH LIPOFILLING Left 06/11/2018   Procedure: Lipofilling from abdomen to left chest;  Surgeon: Irene Limbo, MD;  Location: [place];  Service: Plastics;  Laterality: Left;   MASTOPEXY Right 04/17/2017   Procedure: RIGHT MASTOPEXY;  Surgeon: Irene Limbo, MD;  Location: [place];  Service: Plastics;  Laterality: Right;   NIPPLE SPARING MASTECTOMY Left 07/01/2016   Procedure: LEFT NIPPLE SPARING MASTECTOMY;  Surgeon: Rolm Bookbinder, MD;  Location: [place];  Service: General;  Laterality: Left;   RADIOACTIVE SEED GUIDED PARTIAL MASTECTOMY WITH AXILLARY SENTINEL LYMPH NODE BIOPSY Right 03/26/2016   Procedure: RIGHT BREAST LUMPECTOMY WITH RADIOACTIVE SEED AND SENTINEL LYMPH NODE BIOPSY;  Surgeon: Rolm Bookbinder, MD;  Location: [place];  Service: General;  Laterality: Right;   RADIOACTIVE SEED GUIDED PARTIAL MASTECTOMY WITH AXILLARY SENTINEL LYMPH NODE BIOPSY Left 05/20/2016   Procedure: LEFT BREAST RADIOACTIVE SEED (two seeds) GUIDED LUMPECTOMY WITH LEFT AXILLARY SENTINEL LYMPH NODE BIOPSY AND LEFT SEED TARGETED AXILLARY LYMPH NODE EXCISION;  Surgeon: Rolm Bookbinder, MD;  Location: [facility];  Service: General;  Laterality: Left;   RE-EXCISION OF BREAST CANCER,SUPERIOR MARGINS Right 05/20/2016   Procedure: RE-EXCISION OF RIGHT BREAST MARGIN;  Surgeon: Rolm Bookbinder, MD;  Location: [place];  Service: General;  Laterality: Right;   RE-EXCISION OF BREAST CANCER,SUPERIOR MARGINS Left 06/02/2016   Procedure: RE-EXCISION OF BREAST  CANCER,SUPERIOR MARGINS;  Surgeon: Rolm Bookbinder, MD;  Location: [place];  Service: General;  Laterality: Left;   REMOVAL OF TISSUE EXPANDER AND PLACEMENT OF IMPLANT Left 04/17/2017   Procedure: REMOVAL OF LEFT TISSUE EXPANDER WITH PLACEMENT OF LEFT SILICONE BREAST IMPLANT;  Surgeon: Irene Limbo, MD;  Location: [place];  Service: Plastics;  Laterality: Left;    Current Outpatient Medications  Medication Sig Dispense Refill   albuterol (PROAIR HFA) 108 (90 Base) MCG/ACT inhaler Inhale 2 puffs into the lungs every 4 (four) hours as needed for wheezing or shortness of breath. 1 Inhaler 1   ALPRAZolam (XANAX) 0.25 MG tablet 1 tablet     amLODipine (NORVASC) 2.5 MG tablet 1 tablet  atorvastatin (LIPITOR) 10 MG tablet 1 tablet     azelastine (ASTELIN) 0.1 % nasal spray      CALCIUM-MAGNESIUM PO Take by mouth.     carboxymethylcellulose 1 % ophthalmic solution      cetirizine (ZYRTEC) 10 MG tablet Take 10 mg by mouth every evening.      Cholecalciferol (VITAMIN D) 2000 units tablet Take 5,000 Units by mouth daily.      FLOVENT HFA 110 MCG/ACT inhaler INHALE 1 PUFF BY MOUTH INTO THE LUNGS TWICE DAILY 12 g 0   hydrocortisone (ANUSOL-HC) 2.5 % rectal cream Place 1 application rectally as needed for hemorrhoids or itching.     ibuprofen (ADVIL) 400 MG tablet Take 400 mg by mouth every 6 (six) hours as needed for mild pain.     OVER THE COUNTER MEDICATION Take by mouth daily. MEGASPOREBIOTIC     PRILOSEC OTC 20 MG tablet every morning. Reported on 12/14/2015     psyllium (METAMUCIL) 58.6 % packet Take 1 packet by mouth daily.     RESTASIS 0.05 % ophthalmic emulsion 1 drop 2 (two) times daily.     tamoxifen (NOLVADEX) 20 MG tablet Take 1 tablet (20 mg total) by mouth daily. 90 tablet 3   triamcinolone (KENALOG) 0.1 %      valACYclovir (VALTREX) 1000 MG tablet Take 1 tablet (1,000 mg total) by mouth 2 (two) times daily. Take for 1 day as directed above as  needed. 30 tablet 1   valsartan-hydrochlorothiazide (DIOVAN-HCT) 160-12.5 MG tablet Take 1 tablet by mouth daily.     venlafaxine XR (EFFEXOR-XR) 75 MG 24 hr capsule TAKE 1 CAPSULE(75 MG) BY MOUTH DAILY WITH BREAKFAST 90 capsule 4   No current facility-administered medications for this visit.     ALLERGIES: Bacitracin, Hydrocodone, Hydrocodone-acetaminophen, Other, Penicillins, Adhesive [tape], Augmentin [amoxicillin-pot clavulanate], and Penicillamine  Family History  Problem Relation Age of Onset   Hypertension Mother    Heart disease Mother    Lung disease Mother    Hypertension Father    Cancer Father        colon   Heart disease Father    Lung cancer Father    Anesthesia problems Father        hard to wake up post-op   Breast cancer Paternal Grandmother        Age 61's   Hypertension Brother    Hyperlipidemia Brother    Diabetes Maternal Grandfather     Social History   Socioeconomic History   Marital status: Single    Spouse name: Not on file   Number of children: Not on file   Years of education: Not on file   Highest education level: Not on file  Occupational History   Not on file  Tobacco Use   Smoking status: Former    Packs/day: 1.00    Years: 13.00    Total pack years: 13.00    Types: Cigarettes    Quit date: 07/27/1990    Years since quitting: 31.6   Smokeless tobacco: Never  Vaping Use   Vaping Use: Never used  Substance and Sexual Activity   Alcohol use: Yes    Alcohol/week: 2.0 standard drinks of alcohol    Types: 2 Glasses of wine per week   Drug use: Not Currently    Types: Marijuana   Sexual activity: Not Currently    Partners: Male    Birth control/protection: Post-menopausal    Comment: 1st intercourse 85 yo-5 partners  Other Topics Concern  Not on file  Social History Narrative   Not on file   Social Determinants of Health   Financial Resource Strain: Not on file  Food Insecurity: Not on file  Transportation Needs: Not on file   Physical Activity: Not on file  Stress: Not on file  Social Connections: Not on file  Intimate Partner Violence: Not on file    Review of Systems  Genitourinary:  Positive for pelvic pain.  All other systems reviewed and are negative.   PHYSICAL EXAMINATION:    BP (!) 140/80 (BP Location: Right Arm, Patient Position: Sitting, Cuff Size: Normal)   LMP 07/28/1996 (Approximate) Comment: pmb bleeding    General appearance: alert, cooperative and appears stated age    Pelvic: External genitalia:  no lesions              Urethra:  normal appearing urethra with no masses, tenderness or lesions              Bartholins and Skenes: normal                 Vagina: normal appearing vagina with normal color and discharge, no lesions              Cervix: no lesions.  Stenosis.                 Bimanual Exam:  Uterus:  normal size, contour, position, consistency, mobility, non-tender              Adnexa: no mass, fullness, tenderness      Pelvic US Uterus 6.51 x 4.99 x 3.81 cm.  Multiple fibroids:  1.36 cm, 1.53 cm, 1.17 cm, 1.28 cm, 0.48 cm, 0.52 cm.  EMS 15.81 mm.  Heterogeneous thickening and feeder vessel. Left ovary 2.65 x 0.9 x 0.92 cm.  Atrophic.  Right ovary 2.04 x 1.63 x 1.38 cm.  Atrophic.  No adnexal masses.  No free fluid. Colon wall thickening noted.   Attempted endometrial biopsy Consent done.  Sterile prep with Hibiclens.  1% lidocaine, 10 cc paracervical block, lot [id number], exp 07/29/23 Tenaculum to anterior cervical lip.  Os finder used, would not pass.  Pipelle used, would not pass.  Procedure aborted.  No complications.  Minimal EBL.   Chaperone was present for exam:  Estill Bamberg, CMA  ASSESSMENT  Bilateral breast cancer.  Status post left mastectomy with reconstruction.  On Tamoxifen.  Pelvic pain. Thickened endometrium.  Cervical stenosis. Colon thickening.   PLAN  Pelvic images and report reviewed.  Endometrial biopsy aborted.   Will proceed with  ultrasound guided hysteroscopy and Myosure resection of endometrial polyp, dilation and curettage.   We discussed Tamoxifen effect on the endometrium to cause polyps, endometrial thickening, and potential endometrial cancer.   She will follow up with her PCP or GI regarding her thickened bowel and any intestinal symptoms.    An After Visit Summary was printed and given to the patient.  20 min  total time was spent for this patient encounter, including preparation, face-to-face counseling with the patient, coordination of care, and documentation of the encounter in addition to the attempted endometrial biopsy.

## 2022-03-05 NOTE — Progress Notes (Unsigned)
[place] Phone: [phone] Subjective:   [name], am serving as a scribe for Dr. Hulan Saas.  I'm seeing this patient by the request  of:  Josetta Huddle, MD  CC: right knee pain   [id number]  Breakdown of the transverse arch discussed over-the-counter orthotics.  Recovery sandals that I think will be beneficial.  Discussed avoiding certain activities.  Follow-up with me in 6 to 8 weeks otherwise  Cannot have the new problem.  I believe that there is acute swelling in the knee that could have contributed to more of a mild displacement of the meniscus causing more of the discomfort.  Discussed which activities to do and which ones to avoid otherwise.  Patient hopefully will respond to the injection Patient wanted to do today.  We discussed worsening symptoms do need to consider the possibility of advanced imaging.  X-rays are pending at this time.  Follow-up again in 6 to 8 weeks.  Updated 03/06/2022 Zakiya BAILEIGH MODISETTE is a 71 y.o. female coming in with complaint of toe and R knee pain. Injection in her knee did not last long but she has been doing some weight training and this has decreased her knee pain. Finds herself sometimes rubbing medial aspect of knee.   Great toe pain has not improved. Saw Dr. Lucia Gaskins and had xray. Fusion was his suggestion. OTC orthotics made her foot pain worse.   Also c/o pain in L CMC joint. Injection has worn off, March 2023.        Past Medical History:  Diagnosis Date   Allergy-induced asthma    prn inhaler   Asthma    allergy induced   Basal cell carcinoma    right clavicle   Breast CA ([place]) dx'd 2017   left and right   Bruises easily    Constipation    Dental crowns present    Endometrial thickening on ultrasound    Family history of adverse reaction to anesthesia    pt's father has hx. of being hard to wake up post-op   GERD (gastroesophageal reflux  disease)    Hemorrhoids    History of breast cancer 2017   bilateral   History of radiation therapy    08/19/16- 09/29/16, Right Breast 50 Gy in 25 fractions, Right Breast boost 10 Gy in 5 fractions, Left Breast 50.4 Gy in 28 fractions   Hot flashes    Immature cataract    Migraines    Osteoarthritis    hands and feet   Osteopenia 02/2016   T score -1.3 FRAX 8.3%/0.7% stable from prior DEXA   Overactive bladder    PMB (postmenopausal bleeding)    Pneumonia    History of   Seasonal allergies    Thyroid nodule 2012   Solitary nodule in the lower pole of the left thyroid lobe , pt unaware   Tingling of upper extremity    Wears glasses    Past Surgical History:  Procedure Laterality Date   BREAST RECONSTRUCTION WITH PLACEMENT OF TISSUE EXPANDER AND FLEX HD (ACELLULAR HYDRATED DERMIS) Left 07/01/2016   Procedure: BREAST RECONSTRUCTION WITH PLACEMENT OF TISSUE EXPANDER AND  ALLODERM;  Surgeon: Irene Limbo, MD;  Location: [place];  Service: Plastics;  Laterality: Left;   BROW LIFT     COLONOSCOPY  2011   [place] N/A 12/06/2018   Procedure: DILATATION & CURETTAGE/HYSTEROSCOPY WITH MYOSURE;  Surgeon: Judeth Horn  Raliegh Ip, MD;  [facility];  Service: Gynecology;  Laterality: N/A;  follow 1st case   INCISION AND DRAINAGE Left 06/02/2016   Procedure: DRAINAGE of left axilla seroma;  Surgeon: Rolm Bookbinder, MD;  Location: [place];  Service: General;  Laterality: Left;   LIPOSUCTION WITH LIPOFILLING Left 06/11/2018   Procedure: Lipofilling from abdomen to left chest;  Surgeon: Irene Limbo, MD;  Location: [place];  Service: Plastics;  Laterality: Left;   MASTOPEXY Right 04/17/2017   Procedure: RIGHT MASTOPEXY;  Surgeon: Irene Limbo, MD;  Location: [place];  Service: Plastics;  Laterality: Right;   NIPPLE SPARING MASTECTOMY Left 07/01/2016    Procedure: LEFT NIPPLE SPARING MASTECTOMY;  Surgeon: Rolm Bookbinder, MD;  Location: [place];  Service: General;  Laterality: Left;   RADIOACTIVE SEED GUIDED PARTIAL MASTECTOMY WITH AXILLARY SENTINEL LYMPH NODE BIOPSY Right 03/26/2016   Procedure: RIGHT BREAST LUMPECTOMY WITH RADIOACTIVE SEED AND SENTINEL LYMPH NODE BIOPSY;  Surgeon: Rolm Bookbinder, MD;  Location: [place];  Service: General;  Laterality: Right;   RADIOACTIVE SEED GUIDED PARTIAL MASTECTOMY WITH AXILLARY SENTINEL LYMPH NODE BIOPSY Left 05/20/2016   Procedure: LEFT BREAST RADIOACTIVE SEED (two seeds) GUIDED LUMPECTOMY WITH LEFT AXILLARY SENTINEL LYMPH NODE BIOPSY AND LEFT SEED TARGETED AXILLARY LYMPH NODE EXCISION;  Surgeon: Rolm Bookbinder, MD;  Location: [place];  Service: General;  Laterality: Left;   RE-EXCISION OF BREAST CANCER,SUPERIOR MARGINS Right 05/20/2016   Procedure: RE-EXCISION OF RIGHT BREAST MARGIN;  Surgeon: Rolm Bookbinder, MD;  Location: [place];  Service: General;  Laterality: Right;   RE-EXCISION OF BREAST CANCER,SUPERIOR MARGINS Left 06/02/2016   Procedure: RE-EXCISION OF BREAST CANCER,SUPERIOR MARGINS;  Surgeon: Rolm Bookbinder, MD;  Location: [place];  Service: General;  Laterality: Left;   REMOVAL OF TISSUE EXPANDER AND PLACEMENT OF IMPLANT Left 04/17/2017   Procedure: REMOVAL OF LEFT TISSUE EXPANDER WITH PLACEMENT OF LEFT SILICONE BREAST IMPLANT;  Surgeon: Irene Limbo, MD;  Location: [place];  Service: Plastics;  Laterality: Left;   Social History   Socioeconomic History   Marital status: Single    Spouse name: Not on file   Number of children: Not on file   Years of education: Not on file   Highest education level: Not on file  Occupational History   Not on file  Tobacco Use   Smoking status: Former    Packs/day: 1.00    Years: 13.00    Total pack years: 13.00    Types: Cigarettes     Quit date: 07/27/1990    Years since quitting: 31.6   Smokeless tobacco: Never  Vaping Use   Vaping Use: Never used  Substance and Sexual Activity   Alcohol use: Yes    Alcohol/week: 2.0 standard drinks of alcohol    Types: 2 Glasses of wine per week   Drug use: Not Currently    Types: Marijuana   Sexual activity: Not Currently    Partners: Male    Birth control/protection: Post-menopausal    Comment: 1st intercourse 57 yo-5 partners  Other Topics Concern   Not on file  Social History Narrative   Not on file   Social Determinants of Health   Financial Resource Strain: Not on file  Food Insecurity: Not on file  Transportation Needs: Not on file  Physical Activity: Not on file  Stress: Not on file  Social Connections: Not on file  Allergies  Allergen Reactions   Bacitracin Swelling   Hydrocodone Other (See Comments)    Restlessness, "energy"   Hydrocodone-Acetaminophen Other (See Comments)   Other     Mold    Penicillins Itching   Adhesive [Tape] Other (See Comments)    SKIN IRRITATION   Augmentin [Amoxicillin-Pot Clavulanate] Itching   Penicillamine Rash   Family History  Problem Relation Age of Onset   Hypertension Mother    Heart disease Mother    Lung disease Mother    Hypertension Father    Cancer Father        colon   Heart disease Father    Lung cancer Father    Anesthesia problems Father        hard to wake up post-op   Breast cancer Paternal Grandmother        Age 75's   Hypertension Brother    Hyperlipidemia Brother    Diabetes Maternal Grandfather      Current Outpatient Medications (Cardiovascular):    amLODipine (NORVASC) 2.5 MG tablet, 1 tablet   atorvastatin (LIPITOR) 10 MG tablet, 1 tablet   valsartan-hydrochlorothiazide (DIOVAN-HCT) 160-12.5 MG tablet, Take 1 tablet by mouth daily.  Current Outpatient Medications (Respiratory):    albuterol (PROAIR HFA) 108 (90 Base) MCG/ACT inhaler, Inhale 2 puffs into the lungs every 4 (four)  hours as needed for wheezing or shortness of breath.   azelastine (ASTELIN) 0.1 % nasal spray,    cetirizine (ZYRTEC) 10 MG tablet, Take 10 mg by mouth every evening.    FLOVENT HFA 110 MCG/ACT inhaler, INHALE 1 PUFF BY MOUTH INTO THE LUNGS TWICE DAILY  Current Outpatient Medications (Analgesics):    ibuprofen (ADVIL) 400 MG tablet, Take 400 mg by mouth every 6 (six) hours as needed for mild pain.   Current Outpatient Medications (Other):    ALPRAZolam (XANAX) 0.25 MG tablet, 1 tablet   Cholecalciferol (VITAMIN D) 2000 units tablet, Take 5,000 Units by mouth daily.    hydrocortisone (ANUSOL-HC) 2.5 % rectal cream, Place 1 application rectally as needed for hemorrhoids or itching.   OVER THE COUNTER MEDICATION, Take by mouth daily. MEGASPOREBIOTIC   PRILOSEC OTC 20 MG tablet, every morning. Reported on 12/14/2015   psyllium (METAMUCIL) 58.6 % packet, Take 1 packet by mouth daily.   RESTASIS 0.05 % ophthalmic emulsion, 1 drop 2 (two) times daily.   tamoxifen (NOLVADEX) 20 MG tablet, Take 1 tablet (20 mg total) by mouth daily.   triamcinolone (KENALOG) 0.1 %,    valACYclovir (VALTREX) 1000 MG tablet, Take 1 tablet (1,000 mg total) by mouth 2 (two) times daily. Take for 1 day as directed above as needed.   venlafaxine XR (EFFEXOR-XR) 75 MG 24 hr capsule, TAKE 1 CAPSULE(75 MG) BY MOUTH DAILY WITH BREAKFAST   CALCIUM-MAGNESIUM PO, Take by mouth.   carboxymethylcellulose 1 % ophthalmic solution,    Reviewed prior external information including notes and imaging from  primary care provider As well as notes that were available from care everywhere and other healthcare systems.  Past medical history, social, surgical and family history all reviewed in electronic medical record.  No pertanent information unless stated regarding to the chief complaint.   Review of Systems:  No headache, visual changes, nausea, vomiting, diarrhea, constipation, dizziness, abdominal pain, skin rash, fevers, chills,  night sweats, weight loss, swollen lymph nodes, body aches, joint swelling, chest pain, shortness of breath, mood changes. POSITIVE muscle aches  Objective  Blood pressure 102/64, pulse 89, height '5\' 3"'$  (1.6  m), weight 161 lb (73 kg), last menstrual period 07/28/1996, SpO2 97 %.   General: No apparent distress alert and oriented x3 mood and affect normal, dressed appropriately.  HEENT: Pupils equal, extraocular movements intact  Respiratory: Patient's speak in full sentences and does not appear short of breath  Cardiovascular: No lower extremity edema, non tender, no erythema  Left thumb exam shows the patient does have arthritic changes of the [facility] joint.  Positive grind test noted.  Right first does have limited range of motion in all planes.  Hallux limitus noted.  Trace effusion noted of the MTP of the first toe  Procedure: Real-time Ultrasound Guided Injection of left CMC joint Device: GE Logiq Q7 Ultrasound guided injection is preferred based studies that show increased duration, increased effect, greater accuracy, decreased procedural pain, increased response rate, and decreased cost with ultrasound guided versus blind injection.  Verbal informed consent obtained.  Time-out conducted.  Noted no overlying erythema, induration, or other signs of local infection.  Skin prepped in a sterile fashion.  Local anesthesia: Topical Ethyl chloride.  With sterile technique and under real time ultrasound guidance: With a 25-gauge half inch needle injected with 0.5 cc of 0.5% Marcaine and 0.5 cc of Kenalog 40 mg/mL Completed without difficulty  Pain immediately improved suggesting accurate placement of the medication.  Advised to call if fevers/chills, erythema, induration, drainage, or persistent bleeding.  Impression: Technically successful ultrasound guided injection.    Impression and Recommendations:    The above documentation has been reviewed and is accurate and complete Lyndal Pulley,  DO

## 2022-03-06 ENCOUNTER — Ambulatory Visit: Payer: Medicare PPO | Admitting: Family Medicine

## 2022-03-06 ENCOUNTER — Telehealth: Payer: Self-pay | Admitting: Obstetrics and Gynecology

## 2022-03-06 ENCOUNTER — Encounter: Payer: Self-pay | Admitting: Family Medicine

## 2022-03-06 ENCOUNTER — Encounter: Payer: Self-pay | Admitting: Obstetrics and Gynecology

## 2022-03-06 ENCOUNTER — Ambulatory Visit: Payer: Self-pay

## 2022-03-06 VITALS — BP 102/64 | HR 89 | Ht 63.0 in | Wt 161.0 lb

## 2022-03-06 DIAGNOSIS — M2021 Hallux rigidus, right foot: Secondary | ICD-10-CM

## 2022-03-06 DIAGNOSIS — M21611 Bunion of right foot: Secondary | ICD-10-CM

## 2022-03-06 DIAGNOSIS — M79671 Pain in right foot: Secondary | ICD-10-CM | POA: Diagnosis not present

## 2022-03-06 DIAGNOSIS — M1812 Unilateral primary osteoarthritis of first carpometacarpal joint, left hand: Secondary | ICD-10-CM | POA: Diagnosis not present

## 2022-03-06 NOTE — Assessment & Plan Note (Signed)
Patient given injection and tolerated the procedure well, discussed icing regimen and home exercises,Patient has responded to the injection previously and hopefully will respond again.  Follow-up again in 6 to 8 weeks

## 2022-03-06 NOTE — Patient Instructions (Addendum)
Injected CMC joint today

## 2022-03-06 NOTE — Assessment & Plan Note (Signed)
Patient does have arthritic changes.  Have gotten custom orthotics previously.  Has tried over-the-counter orthotics as well and did not have any significant improvement.  Patient has seen an orthopedic surgeon and is discussing the possibility of fusion.  Patient brought an x-ray today that we did review that did show the patient had some asymmetric arthritic changes noted.  Patient will follow-up with me again in 4 to 6 weeks if she has any other questions on this.

## 2022-03-06 NOTE — Telephone Encounter (Signed)
Please precert and schedule an ultrasound guided hysteroscopy with Myosure resection of endometrial mass, dilation and curettage at [facility].   Dx is endometrial mass, Tamoxifen therapy.   Time needed is 1 hour.   She will need a preop with me.

## 2022-03-10 ENCOUNTER — Other Ambulatory Visit: Payer: Self-pay | Admitting: Obstetrics and Gynecology

## 2022-03-10 ENCOUNTER — Other Ambulatory Visit (HOSPITAL_COMMUNITY): Payer: Self-pay | Admitting: Obstetrics and Gynecology

## 2022-03-10 DIAGNOSIS — N9489 Other specified conditions associated with female genital organs and menstrual cycle: Secondary | ICD-10-CM

## 2022-03-10 NOTE — Telephone Encounter (Addendum)
Spoke with patient. Reviewed surgery dates. Patient request to proceed with surgery on 03/25/22.  Advised patient I will forward to business office for return call. I will return call once surgery date and time confirmed. Patient verbalizes understanding and is agreeable.   Surgery request sent.

## 2022-03-10 NOTE — Progress Notes (Deleted)
GYNECOLOGY  VISIT   HPI: 71 y.o.   Single  Caucasian  female   G0P0 with Patient's last menstrual period was 07/28/1996 (approximate).   here for pre-op for [facility] w/ hysteroscopy w/ myosure on 03/25/22.   GYNECOLOGIC HISTORY: Patient's last menstrual period was 07/28/1996 (approximate). Contraception: PM Menopausal hormone therapy: none Last mammogram: 07/09/2021-birads 2 benign Last pap smear: 07/02/2021-WNL        OB History     Gravida  0   Para      Term      Preterm      AB      Living         SAB      IAB      Ectopic      Multiple      Live Births                 Patient Active Problem List   Diagnosis Date Noted   Degenerative tear of medial meniscus 01/23/2022   Greater trochanteric bursitis of right hip 06/11/2021   Arthritis of carpometacarpal (CMC) joint of left thumb 06/11/2021   Mass of right lung 07/13/2020   Arthritis 08/03/2019   Rosacea 08/03/2019   Pes anserinus bursitis of right knee 03/28/2019   Pain in right knee 03/22/2019   Adhesive capsulitis of right shoulder 10/18/2018   Pain in right hand 10/05/2018   Tear of biceps tendon 09/21/2018   Moderate persistent asthma, uncomplicated 73/71/0626   Hallux rigidus of right foot 06/15/2017   Left Achilles tendinitis 06/15/2017   History of bilateral breast cancer 04/23/2017   Mild persistent asthma, uncomplicated 94/85/4627   Other seasonal allergic rhinitis 02/12/2017   History of therapeutic radiation 02/11/2017   Malignant neoplasm of overlapping sites of left breast in female, estrogen receptor positive ([place]) 10/27/2016   Sinusitis 09/01/2016   Hyponatremia 09/01/2016   Acquired absence of left breast 07/16/2016   Malignant neoplasm of upper-outer quadrant of right breast in female, estrogen receptor positive ([place]) 04/19/2016   Dizziness 02/19/2016   Sensorineural hearing loss (SNHL), bilateral 02/19/2016   Tinnitus of both ears 02/19/2016   Bunion of great toe of right foot  04/04/2015   Hemorrhoid 11/23/2014   Anal skin tag 11/23/2014   Incontinence of urine 07/28/2014   Retrocalcaneal bursitis 05/11/2014   Pain in joint, ankle and foot 05/11/2014   Heart burn    Joint pain    Abnormal Pap smear    Osteopenia     Past Medical History:  Diagnosis Date   Allergy-induced asthma    prn inhaler   Asthma    allergy induced   Basal cell carcinoma    right clavicle   Breast CA ([place]) dx'd 2017   left and right   Bruises easily    Constipation    Dental crowns present    Endometrial thickening on ultrasound    Family history of adverse reaction to anesthesia    pt's father has hx. of being hard to wake up post-op   GERD (gastroesophageal reflux disease)    Hemorrhoids    History of breast cancer 2017   bilateral   History of radiation therapy    08/19/16- 09/29/16, Right Breast 50 Gy in 25 fractions, Right Breast boost 10 Gy in 5 fractions, Left Breast 50.4 Gy in 28 fractions   Hot flashes    Immature cataract    Migraines    Osteoarthritis    hands and feet  Osteopenia 02/2016   T score -1.3 FRAX 8.3%/0.7% stable from prior DEXA   Overactive bladder    PMB (postmenopausal bleeding)    Pneumonia    History of   Seasonal allergies    Thyroid nodule 2012   Solitary nodule in the lower pole of the left thyroid lobe , pt unaware   Tingling of upper extremity    Wears glasses     Past Surgical History:  Procedure Laterality Date   BREAST RECONSTRUCTION WITH PLACEMENT OF TISSUE EXPANDER AND FLEX HD (ACELLULAR HYDRATED DERMIS) Left 07/01/2016   Procedure: BREAST RECONSTRUCTION WITH PLACEMENT OF TISSUE EXPANDER AND  ALLODERM;  Surgeon: Irene Limbo, MD;  Location: [place];  Service: Plastics;  Laterality: Left;   BROW LIFT     COLONOSCOPY  2011   DILATATION & CURETTAGE/HYSTEROSCOPY WITH MYOSURE N/A 12/06/2018   Procedure: DILATATION & CURETTAGE/HYSTEROSCOPY WITH MYOSURE;  Surgeon: [name], MD;  Location:  [facility];  Service: Gynecology;  Laterality: N/A;  follow 1st case   INCISION AND DRAINAGE Left 06/02/2016   Procedure: DRAINAGE of left axilla seroma;  Surgeon: Rolm Bookbinder, MD;  Location: [place];  Service: General;  Laterality: Left;   LIPOSUCTION WITH LIPOFILLING Left 06/11/2018   Procedure: Lipofilling from abdomen to left chest;  Surgeon: Irene Limbo, MD;  Location: [place];  Service: Plastics;  Laterality: Left;   MASTOPEXY Right 04/17/2017   Procedure: RIGHT MASTOPEXY;  Surgeon: Irene Limbo, MD;  Location: [place];  Service: Plastics;  Laterality: Right;   NIPPLE SPARING MASTECTOMY Left 07/01/2016   Procedure: LEFT NIPPLE SPARING MASTECTOMY;  Surgeon: Rolm Bookbinder, MD;  Location: [place];  Service: General;  Laterality: Left;   RADIOACTIVE SEED GUIDED PARTIAL MASTECTOMY WITH AXILLARY SENTINEL LYMPH NODE BIOPSY Right 03/26/2016   Procedure: RIGHT BREAST LUMPECTOMY WITH RADIOACTIVE SEED AND SENTINEL LYMPH NODE BIOPSY;  Surgeon: Rolm Bookbinder, MD;  Location: [place];  Service: General;  Laterality: Right;   RADIOACTIVE SEED GUIDED PARTIAL MASTECTOMY WITH AXILLARY SENTINEL LYMPH NODE BIOPSY Left 05/20/2016   Procedure: LEFT BREAST RADIOACTIVE SEED (two seeds) GUIDED LUMPECTOMY WITH LEFT AXILLARY SENTINEL LYMPH NODE BIOPSY AND LEFT SEED TARGETED AXILLARY LYMPH NODE EXCISION;  Surgeon: Rolm Bookbinder, MD;  Location: [place];  Service: General;  Laterality: Left;   RE-EXCISION OF BREAST CANCER,SUPERIOR MARGINS Right 05/20/2016   Procedure: RE-EXCISION OF RIGHT BREAST MARGIN;  Surgeon: Rolm Bookbinder, MD;  Location: [place];  Service: General;  Laterality: Right;   RE-EXCISION OF BREAST CANCER,SUPERIOR MARGINS Left 06/02/2016   Procedure: RE-EXCISION OF BREAST CANCER,SUPERIOR MARGINS;  Surgeon: Rolm Bookbinder, MD;  Location:  [place];  Service: General;  Laterality: Left;   REMOVAL OF TISSUE EXPANDER AND PLACEMENT OF IMPLANT Left 04/17/2017   Procedure: REMOVAL OF LEFT TISSUE EXPANDER WITH PLACEMENT OF LEFT SILICONE BREAST IMPLANT;  Surgeon: Irene Limbo, MD;  Location: [place];  Service: Plastics;  Laterality: Left;    Current Outpatient Medications  Medication Sig Dispense Refill   albuterol (PROAIR HFA) 108 (90 Base) MCG/ACT inhaler Inhale 2 puffs into the lungs every 4 (four) hours as needed for wheezing or shortness of breath. 1 Inhaler 1   ALPRAZolam (XANAX) 0.25 MG tablet 1 tablet     amLODipine (NORVASC) 2.5 MG tablet 1 tablet     atorvastatin (LIPITOR) 10 MG tablet 1 tablet     azelastine (ASTELIN) 0.1 %  nasal spray      carboxymethylcellulose 1 % ophthalmic solution      cetirizine (ZYRTEC) 10 MG tablet Take 10 mg by mouth every evening.      Cholecalciferol (VITAMIN D) 2000 units tablet Take 5,000 Units by mouth daily.      FLOVENT HFA 110 MCG/ACT inhaler INHALE 1 PUFF BY MOUTH INTO THE LUNGS TWICE DAILY 12 g 0   hydrocortisone (ANUSOL-HC) 2.5 % rectal cream Place 1 application rectally as needed for hemorrhoids or itching.     ibuprofen (ADVIL) 400 MG tablet Take 400 mg by mouth every 6 (six) hours as needed for mild pain.     OVER THE COUNTER MEDICATION Take by mouth daily. MEGASPOREBIOTIC     PRILOSEC OTC 20 MG tablet every morning. Reported on 12/14/2015     psyllium (METAMUCIL) 58.6 % packet Take 1 packet by mouth daily.     RESTASIS 0.05 % ophthalmic emulsion 1 drop 2 (two) times daily.     tamoxifen (NOLVADEX) 20 MG tablet Take 1 tablet (20 mg total) by mouth daily. 90 tablet 3   triamcinolone (KENALOG) 0.1 %      valACYclovir (VALTREX) 1000 MG tablet Take 1 tablet (1,000 mg total) by mouth 2 (two) times daily. Take for 1 day as directed above as needed. 30 tablet 1   valsartan-hydrochlorothiazide (DIOVAN-HCT) 160-12.5 MG tablet Take 1 tablet by mouth  daily.     venlafaxine XR (EFFEXOR-XR) 75 MG 24 hr capsule TAKE 1 CAPSULE(75 MG) BY MOUTH DAILY WITH BREAKFAST 90 capsule 4   No current facility-administered medications for this visit.     ALLERGIES: Bacitracin, Hydrocodone, Hydrocodone-acetaminophen, Other, Penicillins, Adhesive [tape], Augmentin [amoxicillin-pot clavulanate], and Penicillamine  Family History  Problem Relation Age of Onset   Hypertension Mother    Heart disease Mother    Lung disease Mother    Hypertension Father    Cancer Father        colon   Heart disease Father    Lung cancer Father    Anesthesia problems Father        hard to wake up post-op   Breast cancer Paternal Grandmother        Age 60's   Hypertension Brother    Hyperlipidemia Brother    Diabetes Maternal Grandfather     Social History   Socioeconomic History   Marital status: Single    Spouse name: Not on file   Number of children: Not on file   Years of education: Not on file   Highest education level: Not on file  Occupational History   Not on file  Tobacco Use   Smoking status: Former    Packs/day: 1.00    Years: 13.00    Total pack years: 13.00    Types: Cigarettes    Quit date: 07/27/1990    Years since quitting: 31.6   Smokeless tobacco: Never  Vaping Use   Vaping Use: Never used  Substance and Sexual Activity   Alcohol use: Yes    Alcohol/week: 2.0 standard drinks of alcohol    Types: 2 Glasses of wine per week   Drug use: Not Currently    Types: Marijuana   Sexual activity: Not Currently    Partners: Male    Birth control/protection: Post-menopausal    Comment: 1st intercourse 23 yo-5 partners  Other Topics Concern   Not on file  Social History Narrative   Not on file   Social Determinants of Radio broadcast assistant  Strain: Not on file  Food Insecurity: Not on file  Transportation Needs: Not on file  Physical Activity: Not on file  Stress: Not on file  Social Connections: Not on file  Intimate Partner  Violence: Not on file    Review of Systems  PHYSICAL EXAMINATION:    LMP 07/28/1996 (Approximate) Comment: pmb bleeding    General appearance: alert, cooperative and appears stated age Head: Normocephalic, without obvious abnormality, atraumatic Neck: no adenopathy, supple, symmetrical, trachea midline and thyroid normal to inspection and palpation Lungs: clear to auscultation bilaterally Breasts: normal appearance, no masses or tenderness, No nipple retraction or dimpling, No nipple discharge or bleeding, No axillary or supraclavicular adenopathy Heart: regular rate and rhythm Abdomen: soft, non-tender, no masses,  no organomegaly Extremities: extremities normal, atraumatic, no cyanosis or edema Skin: Skin color, texture, turgor normal. No rashes or lesions Lymph nodes: Cervical, supraclavicular, and axillary nodes normal. No abnormal inguinal nodes palpated Neurologic: Grossly normal  Pelvic: External genitalia:  no lesions              Urethra:  normal appearing urethra with no masses, tenderness or lesions              Bartholins and Skenes: normal                 Vagina: normal appearing vagina with normal color and discharge, no lesions              Cervix: no lesions                Bimanual Exam:  Uterus:  normal size, contour, position, consistency, mobility, non-tender              Adnexa: no mass, fullness, tenderness              Rectal exam: {yes no:[id number]}.  Confirms.              Anus:  normal sphincter tone, no lesions  Chaperone was present for exam:  ***  ASSESSMENT     PLAN     An After Visit Summary was printed and given to the patient.  ______ minutes face to face time of which over 50% was spent in counseling.

## 2022-03-10 NOTE — Telephone Encounter (Signed)
Patient returned call. Patient request to cancel surgery at this time. Patient states she is awaiting return call from GI to schedule CT of abdomen/pelvis, wants to have these results prior to proceeding with surgery. Patient states she will notify Dr. Quincy Simmonds once imaging has been scheduled. Advised patient I will provide update to Dr. Quincy Simmonds and return call if any additional recommendations. Patient agreeable.   Spoke with Maudie Mercury in [place] scheduling, case cancelled.  Will keep surgery chart for f/u. Routing to Dr. Quincy Simmonds.

## 2022-03-11 NOTE — Telephone Encounter (Signed)
Spoke with patient. Advised per Dr. Quincy Simmonds.  Patient will return call to office to provide update once imaging has been completed.   Encounter closed.

## 2022-03-11 NOTE — Telephone Encounter (Signed)
Thank you for the update.   Please keep in your surgery file.   Patient has not had any biopsy done of the thickened endometrium, so we do not currently have a diagnosis for her.

## 2022-03-13 ENCOUNTER — Encounter: Payer: Self-pay | Admitting: Obstetrics and Gynecology

## 2022-03-14 ENCOUNTER — Encounter: Payer: Medicare PPO | Admitting: Obstetrics and Gynecology

## 2022-03-14 ENCOUNTER — Encounter: Payer: Self-pay | Admitting: Obstetrics and Gynecology

## 2022-03-14 DIAGNOSIS — K449 Diaphragmatic hernia without obstruction or gangrene: Secondary | ICD-10-CM | POA: Diagnosis not present

## 2022-03-14 DIAGNOSIS — K5790 Diverticulosis of intestine, part unspecified, without perforation or abscess without bleeding: Secondary | ICD-10-CM | POA: Diagnosis not present

## 2022-03-14 DIAGNOSIS — I700 Atherosclerosis of aorta: Secondary | ICD-10-CM | POA: Diagnosis not present

## 2022-03-14 DIAGNOSIS — K429 Umbilical hernia without obstruction or gangrene: Secondary | ICD-10-CM | POA: Diagnosis not present

## 2022-03-14 DIAGNOSIS — I708 Atherosclerosis of other arteries: Secondary | ICD-10-CM | POA: Diagnosis not present

## 2022-03-14 DIAGNOSIS — N281 Cyst of kidney, acquired: Secondary | ICD-10-CM | POA: Diagnosis not present

## 2022-03-17 NOTE — Telephone Encounter (Signed)
CT results are finalized.   Please reach out to patient to see if we can reschedule her hysteroscopy with ultrasound guidance.

## 2022-03-18 ENCOUNTER — Telehealth: Payer: Self-pay | Admitting: *Deleted

## 2022-03-18 NOTE — Telephone Encounter (Signed)
[name], MD Yesterday (7:28 AM)    CT results are finalized.    Please reach out to patient to see if we can reschedule her hysteroscopy with ultrasound guidance.       Note    Sabrina Tapia "[name]"  P [facility] (supporting Brook E Yisroel Ramming, MD) 4 days ago    MY CT results are on MyChart from [place]. Hope you are able to get it.Marland Kitchen

## 2022-03-18 NOTE — Telephone Encounter (Signed)
Left message to call Jamori Biggar, RN at [facility], [phone].  

## 2022-03-18 NOTE — Telephone Encounter (Signed)
Left message to call Fiona Coto, RN at [facility], [phone].  

## 2022-03-19 NOTE — Telephone Encounter (Signed)
See telephone encounter dated 03/18/22.  Encounter closed.

## 2022-03-19 NOTE — Telephone Encounter (Signed)
Call returned to patient.  Left message to call Camdon Saetern, RN at [facility], [phone].  

## 2022-03-25 ENCOUNTER — Ambulatory Visit (HOSPITAL_COMMUNITY): Admission: RE | Admit: 2022-03-25 | Payer: Medicare PPO | Source: Ambulatory Visit

## 2022-03-25 ENCOUNTER — Ambulatory Visit (HOSPITAL_BASED_OUTPATIENT_CLINIC_OR_DEPARTMENT_OTHER): Admit: 2022-03-25 | Payer: Medicare PPO | Admitting: Obstetrics and Gynecology

## 2022-03-25 ENCOUNTER — Encounter (HOSPITAL_BASED_OUTPATIENT_CLINIC_OR_DEPARTMENT_OTHER): Payer: Self-pay

## 2022-03-25 SURGERY — DILATATION & CURETTAGE/HYSTEROSCOPY WITH MYOSURE
Anesthesia: Choice

## 2022-03-26 NOTE — Telephone Encounter (Signed)
Spoke with patient regarding surgery benefits. Patient acknowledges understanding of information presented. Patient is aware that benefits presented are professional benefits only. Patient is aware the hospital will call with facility benefits. See account note.  Routing to Jill Hamm, RN.  

## 2022-03-26 NOTE — Telephone Encounter (Signed)
See MyChart messages. Reviewed surgery dates. Patient request to proceed with surgery on 04/21/22.   I will return call once surgery date and time confirmed.   Surgery request sent.   Routing to [employer] for benefits

## 2022-03-26 NOTE — Telephone Encounter (Signed)
Patient left voicemail stating she has just returned from vacation and her September schedule is looking busy. Requesting e-mail response with September available dates for surgery to see if she can work something out with her family.   MyChart message sent to patient.

## 2022-03-27 NOTE — Telephone Encounter (Signed)
Spoke with patient. Surgery date request confirmed.  Advised surgery is scheduled for [facility] on 04/21/22, 0845.  Surgery instruction sheet and hospital brochure reviewed, printed copy will be mailed.  Patient verbalizes understanding and is agreeable.  Routing to provider. Encounter closed.   Cc: [facility]

## 2022-04-04 ENCOUNTER — Encounter: Payer: Medicare PPO | Admitting: Obstetrics and Gynecology

## 2022-04-07 ENCOUNTER — Encounter: Payer: Self-pay | Admitting: Obstetrics and Gynecology

## 2022-04-07 ENCOUNTER — Ambulatory Visit: Payer: Medicare PPO | Admitting: Obstetrics and Gynecology

## 2022-04-07 VITALS — BP 140/82 | HR 77 | Ht 64.0 in | Wt 160.0 lb

## 2022-04-07 DIAGNOSIS — N9489 Other specified conditions associated with female genital organs and menstrual cycle: Secondary | ICD-10-CM

## 2022-04-07 DIAGNOSIS — N882 Stricture and stenosis of cervix uteri: Secondary | ICD-10-CM

## 2022-04-07 NOTE — Progress Notes (Signed)
GYNECOLOGY  VISIT   HPI: 71 y.o.   Single  Caucasian  female   G0P0 with Patient's last menstrual period was 07/28/1996 (approximate).   here for pre op exam for hysteroscopy procedure.   Seen for pelvic pain on 03/05/22.  No vaginal bleeding.   Pelvic US that day showed: Uterus 6.51 x 4.99 x 3.81 cm.  Multiple fibroids:  1.36 cm, 1.53 cm, 1.17 cm, 1.28 cm, 0.48 cm, 0.52 cm.  EMS 15.81 mm.  Heterogeneous thickening and feeder vessel. Left ovary 2.65 x 0.9 x 0.92 cm.  Atrophic.  Right ovary 2.04 x 1.63 x 1.38 cm.  Atrophic.  No adnexal masses.  No free fluid. Colon wall thickening noted.   An endometrial biopsy was attempted on 03/05/22 and was unsuccessful due to cervical stenosis.   Subsequent CT of abdomen and pelvis on 03/14/22 showed diverticulosis and no diverticulitis.   On Tamoxifen.    She has a history of hysteroscopic polypectomy in 2020 wit benign polyp noted.   GYNECOLOGIC HISTORY: Patient's last menstrual period was 07/28/1996 (approximate). Contraception:  PMP Menopausal hormone therapy:  None Last mammogram:  07-09-21 Neg/Birads2 Last pap smear:   07/02/21 - normal, 03/25/19 Neg, 01/30/17 Neg:Neg HR HPV, 12-11-14 Neg        OB History     Gravida  0   Para      Term      Preterm      AB      Living         SAB      IAB      Ectopic      Multiple      Live Births                 Patient Active Problem List   Diagnosis Date Noted   Degenerative tear of medial meniscus 01/23/2022   Greater trochanteric bursitis of right hip 06/11/2021   Arthritis of carpometacarpal (CMC) joint of left thumb 06/11/2021   Mass of right lung 07/13/2020   Arthritis 08/03/2019   Rosacea 08/03/2019   Pes anserinus bursitis of right knee 03/28/2019   Pain in right knee 03/22/2019   Adhesive capsulitis of right shoulder 10/18/2018   Pain in right hand 10/05/2018   Tear of biceps tendon 09/21/2018   Moderate persistent asthma, uncomplicated 66/12/3014   Hallux  rigidus of right foot 06/15/2017   Left Achilles tendinitis 06/15/2017   History of bilateral breast cancer 04/23/2017   Mild persistent asthma, uncomplicated 08/05/3233   Other seasonal allergic rhinitis 02/12/2017   History of therapeutic radiation 02/11/2017   Malignant neoplasm of overlapping sites of left breast in female, estrogen receptor positive ([place]) 10/27/2016   Sinusitis 09/01/2016   Hyponatremia 09/01/2016   Acquired absence of left breast 07/16/2016   Malignant neoplasm of upper-outer quadrant of right breast in female, estrogen receptor positive ([place]) 04/19/2016   Dizziness 02/19/2016   Sensorineural hearing loss (SNHL), bilateral 02/19/2016   Tinnitus of both ears 02/19/2016   Bunion of great toe of right foot 04/04/2015   Hemorrhoid 11/23/2014   Anal skin tag 11/23/2014   Incontinence of urine 07/28/2014   Retrocalcaneal bursitis 05/11/2014   Pain in joint, ankle and foot 05/11/2014   Heart burn    Joint pain    Abnormal Pap smear    Osteopenia     Past Medical History:  Diagnosis Date   Allergy-induced asthma    prn inhaler   Asthma    allergy  induced   Basal cell carcinoma    right clavicle   Breast CA ([place]) dx'd 2017   left and right   Bruises easily    Constipation    Dental crowns present    Endometrial thickening on ultrasound    Family history of adverse reaction to anesthesia    pt's father has hx. of being hard to wake up post-op   GERD (gastroesophageal reflux disease)    Hemorrhoids    History of breast cancer 2017   bilateral   History of radiation therapy    08/19/16- 09/29/16, Right Breast 50 Gy in 25 fractions, Right Breast boost 10 Gy in 5 fractions, Left Breast 50.4 Gy in 28 fractions   Hot flashes    Immature cataract    Migraines    Osteoarthritis    hands and feet   Osteopenia 02/2016   T score -1.3 FRAX 8.3%/0.7% stable from prior DEXA   Overactive bladder    PMB (postmenopausal bleeding)    Pneumonia    History of    Seasonal allergies    Thyroid nodule 2012   Solitary nodule in the lower pole of the left thyroid lobe , pt unaware   Tingling of upper extremity    Wears glasses     Past Surgical History:  Procedure Laterality Date   BREAST RECONSTRUCTION WITH PLACEMENT OF TISSUE EXPANDER AND FLEX HD (ACELLULAR HYDRATED DERMIS) Left 07/01/2016   Procedure: BREAST RECONSTRUCTION WITH PLACEMENT OF TISSUE EXPANDER AND  ALLODERM;  Surgeon: Irene Limbo, MD;  Location: [facility];  Service: Plastics;  Laterality: Left;   BROW LIFT     COLONOSCOPY  2011   DILATATION & CURETTAGE/HYSTEROSCOPY WITH MYOSURE N/A 12/06/2018   Procedure: DILATATION & CURETTAGE/HYSTEROSCOPY WITH MYOSURE;  Surgeon: [name], MD;  Location: [facility];  Service: Gynecology;  Laterality: N/A;  follow 1st case   INCISION AND DRAINAGE Left 06/02/2016   Procedure: DRAINAGE of left axilla seroma;  Surgeon: Rolm Bookbinder, MD;  Location: [place];  Service: General;  Laterality: Left;   LIPOSUCTION WITH LIPOFILLING Left 06/11/2018   Procedure: Lipofilling from abdomen to left chest;  Surgeon: Irene Limbo, MD;  Location: [facility];  Service: Plastics;  Laterality: Left;   MASTOPEXY Right 04/17/2017   Procedure: RIGHT MASTOPEXY;  Surgeon: Irene Limbo, MD;  Location: [facility];  Service: Plastics;  Laterality: Right;   NIPPLE SPARING MASTECTOMY Left 07/01/2016   Procedure: LEFT NIPPLE SPARING MASTECTOMY;  Surgeon: Rolm Bookbinder, MD;  Location: [place];  Service: General;  Laterality: Left;   RADIOACTIVE SEED GUIDED PARTIAL MASTECTOMY WITH AXILLARY SENTINEL LYMPH NODE BIOPSY Right 03/26/2016   Procedure: RIGHT BREAST LUMPECTOMY WITH RADIOACTIVE SEED AND SENTINEL LYMPH NODE BIOPSY;  Surgeon: Rolm Bookbinder, MD;  Location: [place];  Service: General;  Laterality: Right;   RADIOACTIVE SEED GUIDED  PARTIAL MASTECTOMY WITH AXILLARY SENTINEL LYMPH NODE BIOPSY Left 05/20/2016   Procedure: LEFT BREAST RADIOACTIVE SEED (two seeds) GUIDED LUMPECTOMY WITH LEFT AXILLARY SENTINEL LYMPH NODE BIOPSY AND LEFT SEED TARGETED AXILLARY LYMPH NODE EXCISION;  Surgeon: Rolm Bookbinder, MD;  Location: [facility];  Service: General;  Laterality: Left;   RE-EXCISION OF BREAST CANCER,SUPERIOR MARGINS Right 05/20/2016   Procedure: RE-EXCISION OF RIGHT BREAST MARGIN;  Surgeon: Rolm Bookbinder, MD;  Location: [place];  Service: General;  Laterality: Right;   RE-EXCISION OF BREAST CANCER,SUPERIOR MARGINS Left 06/02/2016  Procedure: RE-EXCISION OF BREAST CANCER,SUPERIOR MARGINS;  Surgeon: Rolm Bookbinder, MD;  Location: [facility];  Service: General;  Laterality: Left;   REMOVAL OF TISSUE EXPANDER AND PLACEMENT OF IMPLANT Left 04/17/2017   Procedure: REMOVAL OF LEFT TISSUE EXPANDER WITH PLACEMENT OF LEFT SILICONE BREAST IMPLANT;  Surgeon: Irene Limbo, MD;  Location: [facility];  Service: Plastics;  Laterality: Left;    Current Outpatient Medications  Medication Sig Dispense Refill   albuterol (PROAIR HFA) 108 (90 Base) MCG/ACT inhaler Inhale 2 puffs into the lungs every 4 (four) hours as needed for wheezing or shortness of breath. 1 Inhaler 1   ALPRAZolam (XANAX) 0.25 MG tablet 1 tablet     amLODipine (NORVASC) 2.5 MG tablet 1 tablet     atorvastatin (LIPITOR) 10 MG tablet 1 tablet     azelastine (ASTELIN) 0.1 % nasal spray      cetirizine (ZYRTEC) 10 MG tablet Take 10 mg by mouth every evening.      Cholecalciferol (VITAMIN D) 2000 units tablet Take 5,000 Units by mouth daily.      FLOVENT HFA 110 MCG/ACT inhaler INHALE 1 PUFF BY MOUTH INTO THE LUNGS TWICE DAILY 12 g 0   hydrocortisone (ANUSOL-HC) 2.5 % rectal cream Place 1 application rectally as needed for hemorrhoids or itching.     ibuprofen (ADVIL) 400 MG tablet Take 400 mg by mouth every  6 (six) hours as needed for mild pain.     OVER THE COUNTER MEDICATION Take by mouth daily. MEGASPOREBIOTIC     PRILOSEC OTC 20 MG tablet every morning. Reported on 12/14/2015     psyllium (METAMUCIL) 58.6 % packet Take 1 packet by mouth daily.     RESTASIS 0.05 % ophthalmic emulsion 1 drop 2 (two) times daily.     tamoxifen (NOLVADEX) 20 MG tablet Take 1 tablet (20 mg total) by mouth daily. 90 tablet 3   triamcinolone (KENALOG) 0.1 %      valACYclovir (VALTREX) 1000 MG tablet Take 1 tablet (1,000 mg total) by mouth 2 (two) times daily. Take for 1 day as directed above as needed. 30 tablet 1   valsartan-hydrochlorothiazide (DIOVAN-HCT) 160-12.5 MG tablet Take 1 tablet by mouth daily.     venlafaxine XR (EFFEXOR-XR) 75 MG 24 hr capsule TAKE 1 CAPSULE(75 MG) BY MOUTH DAILY WITH BREAKFAST 90 capsule 4   No current facility-administered medications for this visit.     ALLERGIES: Bacitracin, Hydrocodone, Hydrocodone-acetaminophen, Other, Penicillins, Adhesive [tape], Augmentin [amoxicillin-pot clavulanate], and Penicillamine  Family History  Problem Relation Age of Onset   Hypertension Mother    Heart disease Mother    Lung disease Mother    Hypertension Father    Cancer Father        colon   Heart disease Father    Lung cancer Father    Anesthesia problems Father        hard to wake up post-op   Breast cancer Paternal Grandmother        Age 28's   Hypertension Brother    Hyperlipidemia Brother    Diabetes Maternal Grandfather     Social History   Socioeconomic History   Marital status: Single    Spouse name: Not on file   Number of children: Not on file   Years of education: Not on file   Highest education level: Not on file  Occupational History   Not on file  Tobacco Use   Smoking status: Former    Packs/day: 1.00  Years: 13.00    Total pack years: 13.00    Types: Cigarettes    Quit date: 07/27/1990    Years since quitting: 31.7   Smokeless tobacco: Never  Vaping  Use   Vaping Use: Never used  Substance and Sexual Activity   Alcohol use: Yes    Alcohol/week: 2.0 standard drinks of alcohol    Types: 2 Glasses of wine per week   Drug use: Not Currently    Types: Marijuana   Sexual activity: Not Currently    Partners: Male    Birth control/protection: Post-menopausal    Comment: 1st intercourse 72 yo-5 partners  Other Topics Concern   Not on file  Social History Narrative   Not on file   Social Determinants of Health   Financial Resource Strain: Not on file  Food Insecurity: Not on file  Transportation Needs: Not on file  Physical Activity: Not on file  Stress: Not on file  Social Connections: Not on file  Intimate Partner Violence: Not on file    Review of Systems  All other systems reviewed and are negative.   PHYSICAL EXAMINATION:    BP (!) 140/82   Pulse 77   Ht '5\' 4"'$  (1.626 m)   Wt 160 lb (72.6 kg)   LMP 07/28/1996 (Approximate) Comment: pmb bleeding  SpO2 98%   BMI 27.46 kg/m     General appearance: alert, cooperative and appears stated age Head: Normocephalic, without obvious abnormality, atraumatic Neck: no adenopathy, supple, symmetrical, trachea midline and thyroid normal to inspection and palpation Lungs: clear to auscultation bilaterally Heart: regular rate and rhythm Abdomen: soft, non-tender, no masses,  no organomegaly Extremities: extremities normal, atraumatic, no cyanosis or edema Skin: Skin color, texture, turgor normal. No rashes or lesions Lymph nodes: Cervical, supraclavicular, and axillary nodes normal. No abnormal inguinal nodes palpated Neurologic: Grossly normal  Pelvic: External genitalia:  no lesions              Urethra:  normal appearing urethra with no masses, tenderness or lesions              Bartholins and Skenes: normal                 Vagina: normal appearing vagina with normal color and discharge, no lesions              Cervix: no lesions                Bimanual Exam:  Uterus:   normal size, contour, position, consistency, mobility, non-tender              Adnexa: no mass, fullness, tenderness             Chaperone was present for exam:  Estill Bamberg, CMA  ASSESSMENT  Bilateral breast cancer.  Status post left mastectomy with reconstruction.  On Tamoxifen.  Pelvic pain. Thickened endometrium.  Cervical stenosis.  PLAN  Will proceed with ultrasound guided hysteroscopy and Myosure resection of endometrial polyp, dilation and curettage.  Risks, benefits, and alternatives reviewed. Risks include but are not limited to bleeding, infection, damage to surrounding organs including uterine perforation requiring hospitalization and laparoscopy, pulmonary edema, reaction to anesthesia, DVT, PE, death, need for further treatment. Surgical expectations and recovery discussed.  Patient wishes to proceed.  An After Visit Summary was printed and given to the patient.

## 2022-04-09 ENCOUNTER — Encounter (HOSPITAL_BASED_OUTPATIENT_CLINIC_OR_DEPARTMENT_OTHER): Payer: Self-pay | Admitting: Obstetrics and Gynecology

## 2022-04-09 ENCOUNTER — Other Ambulatory Visit: Payer: Self-pay

## 2022-04-09 NOTE — Progress Notes (Signed)
Spoke w/ via phone for pre-op interview---pt Lab needs dos----  I stat, ekg per anesthesia, surgery orders pending             Lab results------ COVID test -----patient states asymptomatic no test needed Arrive at -------007 am 04-21-2022 NPO after MN NO Solid Food.  Clear liquids from MN until---545 am Med rec completed Medications to take morning of surgery ----albuterol and flovent inhalers prn/bring inhalers, venlafaxine, amlodipine, prilosec, fluticasone nasal spray, astelin nasal spray, zyrtec- Diabetic medication -----n/a Patient instructed no nail polish to be worn day of surgery Patient instructed to bring photo id and insurance card day of surgery Patient aware to have Driver (ride ) / caregiver   Sabrina Tapia sister in law and brother Sabrina Tapia  for 24 hours after surgery  Patient Special Instructions -----none Pre-Op special Istructions -----surgery orders requested dr Quincy Simmonds epic ib Patient verbalized understanding of instructions that were given at this phone interview. Patient denies shortness of breath, chest pain, fever, cough at this phone interview.

## 2022-04-13 NOTE — H&P (Signed)
Office Visit  04/07/2022 [facility], Everardo All, MD Obstetrics and Gynecology Endometrial mass +1 more Dx Pre-op Exam ; Referred by Josetta Huddle, MD Reason for Visit   Additional Documentation  Vitals:  BP 140/82 Important   Pulse 77 Ht '5\' 4"'$  (1.626 m) Wt 72.6 kg LMP 07/28/1996 (Approximate) SpO2 98% BMI 27.46 kg/m BSA 1.81 m  Flowsheets:  NEWS, MEWS Score, Anthropometrics, Method of Visit   Encounter Info:  Billing Info, History, Allergies, Detailed Report    All Notes   Progress Notes by [name], MD at 04/07/2022 11:00 AM  Author: [name], MD Author Type: Physician Filed: 04/07/2022  8:03 PM  Note Status: Signed Cosign: Cosign Not Required Encounter Date: 04/07/2022  Editor: [name], MD (Physician)      Prior Versions: 1. Lowella Fairy, CMA (Certified Medical Assistant) at 04/07/2022 11:15 AM - Sign when Signing Visit  GYNECOLOGY  VISIT   HPI: 71 y.o.   Single  Caucasian  female   G0P0 with Patient's last menstrual period was 07/28/1996 (approximate).   here for pre op exam for hysteroscopy procedure.    Seen for pelvic pain on 03/05/22.  No vaginal bleeding.   Pelvic US that day showed: Uterus 6.51 x 4.99 x 3.81 cm.  Multiple fibroids:  1.36 cm, 1.53 cm, 1.17 cm, 1.28 cm, 0.48 cm, 0.52 cm.  EMS 15.81 mm.  Heterogeneous thickening and feeder vessel. Left ovary 2.65 x 0.9 x 0.92 cm.  Atrophic.  Right ovary 2.04 x 1.63 x 1.38 cm.  Atrophic.  No adnexal masses.  No free fluid. Colon wall thickening noted.    An endometrial biopsy was attempted on 03/05/22 and was unsuccessful due to cervical stenosis.    Subsequent CT of abdomen and pelvis on 03/14/22 showed diverticulosis and no diverticulitis.    On Tamoxifen.     She has a history of hysteroscopic polypectomy in 2020 wit benign polyp noted.    GYNECOLOGIC HISTORY: Patient's last menstrual period was 07/28/1996  (approximate). Contraception:  PMP Menopausal hormone therapy:  None Last mammogram:  07-09-21 Neg/Birads2 Last pap smear:   07/02/21 - normal, 03/25/19 Neg, 01/30/17 Neg:Neg HR HPV, 12-11-14 Neg        OB History       Gravida  0   Para      Term      Preterm      AB      Living           SAB      IAB      Ectopic      Multiple      Live Births                        Patient Active Problem List    Diagnosis Date Noted   Degenerative tear of medial meniscus 01/23/2022   Greater trochanteric bursitis of right hip 06/11/2021   Arthritis of carpometacarpal [facility]) joint of left thumb 06/11/2021   Mass of right lung 07/13/2020   Arthritis 08/03/2019   Rosacea 08/03/2019   Pes anserinus bursitis of right knee 03/28/2019   Pain in right knee 03/22/2019   Adhesive capsulitis of right shoulder 10/18/2018   Pain in right hand 10/05/2018   Tear of biceps tendon 09/21/2018   Moderate persistent asthma, uncomplicated 26/83/4196   Hallux rigidus of right foot 06/15/2017  Left Achilles tendinitis 06/15/2017   History of bilateral breast cancer 04/23/2017   Mild persistent asthma, uncomplicated 95/62/1308   Other seasonal allergic rhinitis 02/12/2017   History of therapeutic radiation 02/11/2017   Malignant neoplasm of overlapping sites of left breast in female, estrogen receptor positive ([place]) 10/27/2016   Sinusitis 09/01/2016   Hyponatremia 09/01/2016   Acquired absence of left breast 07/16/2016   Malignant neoplasm of upper-outer quadrant of right breast in female, estrogen receptor positive ([place]) 04/19/2016   Dizziness 02/19/2016   Sensorineural hearing loss (SNHL), bilateral 02/19/2016   Tinnitus of both ears 02/19/2016   Bunion of great toe of right foot 04/04/2015   Hemorrhoid 11/23/2014   Anal skin tag 11/23/2014   Incontinence of urine 07/28/2014   Retrocalcaneal bursitis 05/11/2014   Pain in joint, ankle and foot 05/11/2014   Heart burn     Joint pain      Abnormal Pap smear     Osteopenia            Past Medical History:  Diagnosis Date   Allergy-induced asthma      prn inhaler   Asthma      allergy induced   Basal cell carcinoma      right clavicle   Breast CA ([place]) dx'd 2017    left and right   Bruises easily     Constipation     Dental crowns present     Endometrial thickening on ultrasound     Family history of adverse reaction to anesthesia      pt's father has hx. of being hard to wake up post-op   GERD (gastroesophageal reflux disease)     Hemorrhoids     History of breast cancer 2017    bilateral   History of radiation therapy      08/19/16- 09/29/16, Right Breast 50 Gy in 25 fractions, Right Breast boost 10 Gy in 5 fractions, Left Breast 50.4 Gy in 28 fractions   Hot flashes     Immature cataract     Migraines     Osteoarthritis      hands and feet   Osteopenia 02/2016    T score -1.3 FRAX 8.3%/0.7% stable from prior DEXA   Overactive bladder     PMB (postmenopausal bleeding)     Pneumonia      History of   Seasonal allergies     Thyroid nodule 2012    Solitary nodule in the lower pole of the left thyroid lobe , pt unaware   Tingling of upper extremity     Wears glasses             Past Surgical History:  Procedure Laterality Date   BREAST RECONSTRUCTION WITH PLACEMENT OF TISSUE EXPANDER AND FLEX HD (ACELLULAR HYDRATED DERMIS) Left 07/01/2016    Procedure: BREAST RECONSTRUCTION WITH PLACEMENT OF TISSUE EXPANDER AND  ALLODERM;  Surgeon: Irene Limbo, MD;  Location: [place];  Service: Plastics;  Laterality: Left;   BROW LIFT       COLONOSCOPY   2011   DILATATION & CURETTAGE/HYSTEROSCOPY WITH MYOSURE N/A 12/06/2018    Procedure: DILATATION & CURETTAGE/HYSTEROSCOPY WITH MYOSURE;  Surgeon: [name], MD;  Location: [facility];  Service: Gynecology;  Laterality: N/A;  follow 1st case   INCISION AND DRAINAGE Left 06/02/2016    Procedure: DRAINAGE of left axilla  seroma;  Surgeon: Rolm Bookbinder, MD;  Location: [place];  Service: General;  Laterality: Left;   LIPOSUCTION WITH LIPOFILLING Left 06/11/2018    Procedure: Lipofilling from abdomen to left chest;  Surgeon: Irene Limbo, MD;  Location: [place];  Service: Plastics;  Laterality: Left;   MASTOPEXY Right 04/17/2017    Procedure: RIGHT MASTOPEXY;  Surgeon: Irene Limbo, MD;  Location: [place];  Service: Plastics;  Laterality: Right;   NIPPLE SPARING MASTECTOMY Left 07/01/2016    Procedure: LEFT NIPPLE SPARING MASTECTOMY;  Surgeon: Rolm Bookbinder, MD;  Location: [place];  Service: General;  Laterality: Left;   RADIOACTIVE SEED GUIDED PARTIAL MASTECTOMY WITH AXILLARY SENTINEL LYMPH NODE BIOPSY Right 03/26/2016    Procedure: RIGHT BREAST LUMPECTOMY WITH RADIOACTIVE SEED AND SENTINEL LYMPH NODE BIOPSY;  Surgeon: Rolm Bookbinder, MD;  Location: [place];  Service: General;  Laterality: Right;   RADIOACTIVE SEED GUIDED PARTIAL MASTECTOMY WITH AXILLARY SENTINEL LYMPH NODE BIOPSY Left 05/20/2016    Procedure: LEFT BREAST RADIOACTIVE SEED (two seeds) GUIDED LUMPECTOMY WITH LEFT AXILLARY SENTINEL LYMPH NODE BIOPSY AND LEFT SEED TARGETED AXILLARY LYMPH NODE EXCISION;  Surgeon: Rolm Bookbinder, MD;  Location: [place];  Service: General;  Laterality: Left;   RE-EXCISION OF BREAST CANCER,SUPERIOR MARGINS Right 05/20/2016    Procedure: RE-EXCISION OF RIGHT BREAST MARGIN;  Surgeon: Rolm Bookbinder, MD;  Location: [place];  Service: General;  Laterality: Right;   RE-EXCISION OF BREAST CANCER,SUPERIOR MARGINS Left 06/02/2016    Procedure: RE-EXCISION OF BREAST CANCER,SUPERIOR MARGINS;  Surgeon: Rolm Bookbinder, MD;  Location: [place];  Service: General;  Laterality: Left;   REMOVAL OF TISSUE EXPANDER AND PLACEMENT OF IMPLANT Left 04/17/2017    Procedure: REMOVAL  OF LEFT TISSUE EXPANDER WITH PLACEMENT OF LEFT SILICONE BREAST IMPLANT;  Surgeon: Irene Limbo, MD;  Location: [place];  Service: Plastics;  Laterality: Left;            Current Outpatient Medications  Medication Sig Dispense Refill   albuterol (PROAIR HFA) 108 (90 Base) MCG/ACT inhaler Inhale 2 puffs into the lungs every 4 (four) hours as needed for wheezing or shortness of breath. 1 Inhaler 1   ALPRAZolam (XANAX) 0.25 MG tablet 1 tablet       amLODipine (NORVASC) 2.5 MG tablet 1 tablet       atorvastatin (LIPITOR) 10 MG tablet 1 tablet       azelastine (ASTELIN) 0.1 % nasal spray         cetirizine (ZYRTEC) 10 MG tablet Take 10 mg by mouth every evening.        Cholecalciferol (VITAMIN D) 2000 units tablet Take 5,000 Units by mouth daily.        FLOVENT HFA 110 MCG/ACT inhaler INHALE 1 PUFF BY MOUTH INTO THE LUNGS TWICE DAILY 12 g 0   hydrocortisone (ANUSOL-HC) 2.5 % rectal cream Place 1 application rectally as needed for hemorrhoids or itching.       ibuprofen (ADVIL) 400 MG tablet Take 400 mg by mouth every 6 (six) hours as needed for mild pain.       OVER THE COUNTER MEDICATION Take by mouth daily. MEGASPOREBIOTIC       PRILOSEC OTC 20 MG tablet every morning. Reported on 12/14/2015       psyllium (METAMUCIL) 58.6 % packet Take 1 packet by mouth daily.       RESTASIS 0.05 % ophthalmic emulsion 1 drop 2 (two) times daily.       tamoxifen (NOLVADEX) 20 MG tablet Take 1  tablet (20 mg total) by mouth daily. 90 tablet 3   triamcinolone (KENALOG) 0.1 %         valACYclovir (VALTREX) 1000 MG tablet Take 1 tablet (1,000 mg total) by mouth 2 (two) times daily. Take for 1 day as directed above as needed. 30 tablet 1   valsartan-hydrochlorothiazide (DIOVAN-HCT) 160-12.5 MG tablet Take 1 tablet by mouth daily.       venlafaxine XR (EFFEXOR-XR) 75 MG 24 hr capsule TAKE 1 CAPSULE(75 MG) BY MOUTH DAILY WITH BREAKFAST 90 capsule 4    No current facility-administered medications  for this visit.      ALLERGIES: Bacitracin, Hydrocodone, Hydrocodone-acetaminophen, Other, Penicillins, Adhesive [tape], Augmentin [amoxicillin-pot clavulanate], and Penicillamine        Family History  Problem Relation Age of Onset   Hypertension Mother     Heart disease Mother     Lung disease Mother     Hypertension Father     Cancer Father          colon   Heart disease Father     Lung cancer Father     Anesthesia problems Father          hard to wake up post-op   Breast cancer Paternal Grandmother          Age 34's   Hypertension Brother     Hyperlipidemia Brother     Diabetes Maternal Grandfather        Social History         Socioeconomic History   Marital status: Single      Spouse name: Not on file   Number of children: Not on file   Years of education: Not on file   Highest education level: Not on file  Occupational History   Not on file  Tobacco Use   Smoking status: Former      Packs/day: 1.00      Years: 13.00      Total pack years: 13.00      Types: Cigarettes      Quit date: 07/27/1990      Years since quitting: 31.7   Smokeless tobacco: Never  Vaping Use   Vaping Use: Never used  Substance and Sexual Activity   Alcohol use: Yes      Alcohol/week: 2.0 standard drinks of alcohol      Types: 2 Glasses of wine per week   Drug use: Not Currently      Types: Marijuana   Sexual activity: Not Currently      Partners: Male      Birth control/protection: Post-menopausal      Comment: 1st intercourse 22 yo-5 partners  Other Topics Concern   Not on file  Social History Narrative   Not on file    Social Determinants of Health    Financial Resource Strain: Not on file  Food Insecurity: Not on file  Transportation Needs: Not on file  Physical Activity: Not on file  Stress: Not on file  Social Connections: Not on file  Intimate Partner Violence: Not on file      Review of Systems  All other systems reviewed and are negative.     PHYSICAL  EXAMINATION:     BP (!) 140/82   Pulse 77   Ht '5\' 4"'$  (1.626 m)   Wt 160 lb (72.6 kg)   LMP 07/28/1996 (Approximate) Comment: pmb bleeding  SpO2 98%   BMI 27.46 kg/m     General appearance: alert, cooperative and appears  stated age Head: Normocephalic, without obvious abnormality, atraumatic Neck: no adenopathy, supple, symmetrical, trachea midline and thyroid normal to inspection and palpation Lungs: clear to auscultation bilaterally Heart: regular rate and rhythm Abdomen: soft, non-tender, no masses,  no organomegaly Extremities: extremities normal, atraumatic, no cyanosis or edema Skin: Skin color, texture, turgor normal. No rashes or lesions Lymph nodes: Cervical, supraclavicular, and axillary nodes normal. No abnormal inguinal nodes palpated Neurologic: Grossly normal   Pelvic: External genitalia:  no lesions              Urethra:  normal appearing urethra with no masses, tenderness or lesions              Bartholins and Skenes: normal                 Vagina: normal appearing vagina with normal color and discharge, no lesions              Cervix: no lesions                Bimanual Exam:  Uterus:  normal size, contour, position, consistency, mobility, non-tender              Adnexa: no mass, fullness, tenderness              Chaperone was present for exam:  Estill Bamberg, CMA   ASSESSMENT   Bilateral breast cancer.  Status post left mastectomy with reconstruction.  On Tamoxifen.  Pelvic pain. Thickened endometrium.  Cervical stenosis.   PLAN   Will proceed with ultrasound guided hysteroscopy and Myosure resection of endometrial polyp, dilation and curettage.  Risks, benefits, and alternatives reviewed. Risks include but are not limited to bleeding, infection, damage to surrounding organs including uterine perforation requiring hospitalization and laparoscopy, pulmonary edema, reaction to anesthesia, DVT, PE, death, need for further treatment. Surgical expectations and  recovery discussed.  Patient wishes to proceed.   An After Visit Summary was printed and given to the patient.

## 2022-04-21 ENCOUNTER — Other Ambulatory Visit: Payer: Self-pay

## 2022-04-21 ENCOUNTER — Encounter (HOSPITAL_BASED_OUTPATIENT_CLINIC_OR_DEPARTMENT_OTHER): Payer: Self-pay | Admitting: Obstetrics and Gynecology

## 2022-04-21 ENCOUNTER — Encounter (HOSPITAL_BASED_OUTPATIENT_CLINIC_OR_DEPARTMENT_OTHER)
Admission: RE | Disposition: A | Discharge status: Home / Self Care | Payer: Self-pay | Source: Home / Self Care | Attending: Obstetrics and Gynecology

## 2022-04-21 ENCOUNTER — Ambulatory Visit (HOSPITAL_BASED_OUTPATIENT_CLINIC_OR_DEPARTMENT_OTHER): Payer: Medicare PPO | Admitting: Anesthesiology

## 2022-04-21 ENCOUNTER — Ambulatory Visit (HOSPITAL_BASED_OUTPATIENT_CLINIC_OR_DEPARTMENT_OTHER)
Admission: RE | Admit: 2022-04-21 | Discharge: 2022-04-21 | Disposition: A | Discharge status: Home / Self Care | Payer: Medicare PPO | Attending: Obstetrics and Gynecology | Admitting: Obstetrics and Gynecology

## 2022-04-21 ENCOUNTER — Ambulatory Visit (HOSPITAL_COMMUNITY)
Admission: RE | Admit: 2022-04-21 | Discharge: 2022-04-21 | Disposition: A | Discharge status: Home / Self Care | Payer: Medicare PPO | Source: Ambulatory Visit | Attending: Obstetrics and Gynecology | Admitting: Obstetrics and Gynecology

## 2022-04-21 DIAGNOSIS — N882 Stricture and stenosis of cervix uteri: Secondary | ICD-10-CM

## 2022-04-21 DIAGNOSIS — K219 Gastro-esophageal reflux disease without esophagitis: Secondary | ICD-10-CM | POA: Diagnosis not present

## 2022-04-21 DIAGNOSIS — R9389 Abnormal findings on diagnostic imaging of other specified body structures: Secondary | ICD-10-CM | POA: Diagnosis not present

## 2022-04-21 DIAGNOSIS — Z87891 Personal history of nicotine dependence: Secondary | ICD-10-CM | POA: Diagnosis not present

## 2022-04-21 DIAGNOSIS — J45909 Unspecified asthma, uncomplicated: Secondary | ICD-10-CM | POA: Insufficient documentation

## 2022-04-21 DIAGNOSIS — Z01818 Encounter for other preprocedural examination: Secondary | ICD-10-CM

## 2022-04-21 DIAGNOSIS — M1990 Unspecified osteoarthritis, unspecified site: Secondary | ICD-10-CM | POA: Insufficient documentation

## 2022-04-21 DIAGNOSIS — N9489 Other specified conditions associated with female genital organs and menstrual cycle: Secondary | ICD-10-CM

## 2022-04-21 DIAGNOSIS — N858 Other specified noninflammatory disorders of uterus: Secondary | ICD-10-CM | POA: Diagnosis not present

## 2022-04-21 DIAGNOSIS — Z79810 Long term (current) use of selective estrogen receptor modulators (SERMs): Secondary | ICD-10-CM | POA: Diagnosis not present

## 2022-04-21 DIAGNOSIS — R102 Pelvic and perineal pain: Secondary | ICD-10-CM | POA: Diagnosis not present

## 2022-04-21 DIAGNOSIS — I10 Essential (primary) hypertension: Secondary | ICD-10-CM | POA: Diagnosis not present

## 2022-04-21 HISTORY — DX: Unspecified atherosclerosis: I70.90

## 2022-04-21 HISTORY — PX: OPERATIVE ULTRASOUND: SHX5996

## 2022-04-21 HISTORY — PX: DILATATION & CURETTAGE/HYSTEROSCOPY WITH MYOSURE: SHX6511

## 2022-04-21 HISTORY — DX: Sleep apnea, unspecified: G47.30

## 2022-04-21 HISTORY — DX: Essential (primary) hypertension: I10

## 2022-04-21 HISTORY — DX: Presence of external hearing-aid: Z97.4

## 2022-04-21 LAB — POCT I-STAT, CHEM 8
BUN: 15 mg/dL (ref 8–23)
Calcium, Ion: 1.12 mmol/L — ABNORMAL LOW (ref 1.15–1.40)
Chloride: 104 mmol/L (ref 98–111)
Creatinine, Ser: 0.5 mg/dL (ref 0.44–1.00)
Glucose, Bld: 92 mg/dL (ref 70–99)
HCT: 39 % (ref 36.0–46.0)
Hemoglobin: 13.3 g/dL (ref 12.0–15.0)
Potassium: 3.9 mmol/L (ref 3.5–5.1)
Sodium: 136 mmol/L (ref 135–145)
TCO2: 23 mmol/L (ref 22–32)

## 2022-04-21 LAB — CBC
HCT: 38.7 % (ref 36.0–46.0)
Hemoglobin: 13 g/dL (ref 12.0–15.0)
MCH: 31 pg (ref 26.0–34.0)
MCHC: 33.6 g/dL (ref 30.0–36.0)
MCV: 92.4 fL (ref 80.0–100.0)
Platelets: 303 10*3/uL (ref 150–400)
RBC: 4.19 MIL/uL (ref 3.87–5.11)
RDW: 13.5 % (ref 11.5–15.5)
WBC: 4.8 10*3/uL (ref 4.0–10.5)
nRBC: 0 % (ref 0.0–0.2)

## 2022-04-21 SURGERY — DILATATION & CURETTAGE/HYSTEROSCOPY WITH MYOSURE
Anesthesia: General | Site: Uterus

## 2022-04-21 MED ORDER — FENTANYL CITRATE (PF) 100 MCG/2ML IJ SOLN: 25.0000 ug | INTRAMUSCULAR | Status: DC | PRN

## 2022-04-21 MED ORDER — MIDAZOLAM HCL 2 MG/2ML IJ SOLN
INTRAMUSCULAR | Status: AC
  Filled 2022-04-21: qty 2

## 2022-04-21 MED ORDER — KETOROLAC TROMETHAMINE 30 MG/ML IJ SOLN
INTRAMUSCULAR | Status: DC | PRN
  Administered 2022-04-21: 30 mg via INTRAVENOUS

## 2022-04-21 MED ORDER — OXYCODONE HCL 5 MG PO TABS: 5.0000 mg | ORAL_TABLET | Freq: Once | ORAL | Status: DC | PRN

## 2022-04-21 MED ORDER — FENTANYL CITRATE (PF) 250 MCG/5ML IJ SOLN
INTRAMUSCULAR | Status: DC | PRN
  Administered 2022-04-21: 50 ug via INTRAVENOUS
  Administered 2022-04-21: 25 ug via INTRAVENOUS

## 2022-04-21 MED ORDER — ONDANSETRON HCL 4 MG/2ML IJ SOLN
INTRAMUSCULAR | Status: DC | PRN
  Administered 2022-04-21: 4 mg via INTRAVENOUS

## 2022-04-21 MED ORDER — IBUPROFEN 600 MG PO TABS: 600.0000 mg | ORAL_TABLET | Freq: Four times a day (QID) | ORAL | 0 refills | Status: DC | PRN

## 2022-04-21 MED ORDER — ACETAMINOPHEN 500 MG PO TABS
1000.0000 mg | ORAL_TABLET | Freq: Once | ORAL | Status: AC
  Administered 2022-04-21: 1000 mg via ORAL

## 2022-04-21 MED ORDER — LACTATED RINGERS IV SOLN: INTRAVENOUS | Status: DC

## 2022-04-21 MED ORDER — DEXAMETHASONE SODIUM PHOSPHATE 10 MG/ML IJ SOLN
INTRAMUSCULAR | Status: DC | PRN
  Administered 2022-04-21: 5 mg via INTRAVENOUS

## 2022-04-21 MED ORDER — LIDOCAINE HCL 1 % IJ SOLN
INTRAMUSCULAR | Status: DC | PRN
  Administered 2022-04-21: 10 mL

## 2022-04-21 MED ORDER — POVIDONE-IODINE 10 % EX SWAB: 2.0000 | Freq: Once | CUTANEOUS | Status: DC

## 2022-04-21 MED ORDER — AMISULPRIDE (ANTIEMETIC) 5 MG/2ML IV SOLN: 10.0000 mg | Freq: Once | INTRAVENOUS | Status: DC | PRN

## 2022-04-21 MED ORDER — ACETAMINOPHEN 500 MG PO TABS
ORAL_TABLET | ORAL | Status: AC
  Filled 2022-04-21: qty 2

## 2022-04-21 MED ORDER — LIDOCAINE 2% (20 MG/ML) 5 ML SYRINGE
INTRAMUSCULAR | Status: DC | PRN
  Administered 2022-04-21: 60 mg via INTRAVENOUS

## 2022-04-21 MED ORDER — FENTANYL CITRATE (PF) 100 MCG/2ML IJ SOLN
INTRAMUSCULAR | Status: AC
  Filled 2022-04-21: qty 2

## 2022-04-21 MED ORDER — OXYCODONE HCL 5 MG/5ML PO SOLN: 5.0000 mg | Freq: Once | ORAL | Status: DC | PRN

## 2022-04-21 MED ORDER — PROPOFOL 10 MG/ML IV BOLUS
INTRAVENOUS | Status: DC | PRN
  Administered 2022-04-21: 30 mg via INTRAVENOUS
  Administered 2022-04-21: 150 mg via INTRAVENOUS

## 2022-04-21 MED ORDER — PROPOFOL 10 MG/ML IV BOLUS
INTRAVENOUS | Status: AC
  Filled 2022-04-21: qty 20

## 2022-04-21 MED ORDER — MIDAZOLAM HCL 2 MG/2ML IJ SOLN
INTRAMUSCULAR | Status: DC | PRN
  Administered 2022-04-21: 1 mg via INTRAVENOUS

## 2022-04-21 SURGICAL SUPPLY — 12 items
CATH ROBINSON RED A/P 16FR (CATHETERS) ×2 IMPLANT
DILATOR CANAL MILEX (MISCELLANEOUS) IMPLANT
GAUZE 4X4 16PLY ~~LOC~~+RFID DBL (SPONGE) ×4 IMPLANT
GLOVE BIO SURGEON STRL SZ 6.5 (GLOVE) ×2 IMPLANT
GOWN STRL REUS W/TWL LRG LVL3 (GOWN DISPOSABLE) ×2 IMPLANT
IV NS IRRIG 3000ML ARTHROMATIC (IV SOLUTION) ×2 IMPLANT
KIT PROCEDURE FLUENT (KITS) ×2 IMPLANT
KIT TURNOVER CYSTO (KITS) ×2 IMPLANT
PACK VAGINAL MINOR WOMEN LF (CUSTOM PROCEDURE TRAY) ×2 IMPLANT
PAD OB MATERNITY 4.3X12.25 (PERSONAL CARE ITEMS) ×2 IMPLANT
SEAL ROD LENS SCOPE MYOSURE (ABLATOR) ×2 IMPLANT
TOWEL OR 17X26 10 PK STRL BLUE (TOWEL DISPOSABLE) ×2 IMPLANT

## 2022-04-21 NOTE — Transfer of Care (Signed)
Immediate Anesthesia Transfer of Care Note  Patient: Sabrina Tapia  Procedure(s) Performed: DILATATION & CURETTAGE/HYSTEROSCOPY (Uterus) OPERATIVE ULTRASOUND  Patient Location: PACU  Anesthesia Type:General  Level of Consciousness: awake, alert  and oriented  Airway & Oxygen Therapy: Patient Spontanous Breathing  Post-op Assessment: Report given to RN and Post -op Vital signs reviewed and stable  Post vital signs: Reviewed and stable  Last Vitals:  Vitals Value Taken Time  BP 134/71 04/21/22 0911  Temp    Pulse 74 04/21/22 0914  Resp 16 04/21/22 0914  SpO2 100 % 04/21/22 0914  Vitals shown include unvalidated device data.  Last Pain:  Vitals:   04/21/22 0719  TempSrc: Oral  PainSc: 0-No pain      Patients Stated Pain Goal: 5 (47/12/52 7129)  Complications: No notable events documented.

## 2022-04-21 NOTE — Anesthesia Preprocedure Evaluation (Signed)
Anesthesia Evaluation  Patient identified by MRN, date of birth, ID band  Airway Mallampati: II  TM Distance: >3 FB Neck ROM: Full    Dental  (+) Dental Advisory Given   Pulmonary asthma , sleep apnea , former smoker,    breath sounds clear to auscultation       Cardiovascular hypertension, Pt. on medications  Rhythm:Regular Rate:Normal     Neuro/Psych  Headaches,    GI/Hepatic Neg liver ROS, GERD  ,  Endo/Other  negative endocrine ROS  Renal/GU negative Renal ROS     Musculoskeletal  (+) Arthritis ,   Abdominal   Peds  Hematology negative hematology ROS (+)   Anesthesia Other Findings   Reproductive/Obstetrics                             Lab Results  Component Value Date   WBC 5.9 07/08/2021   HGB 14.4 07/08/2021   HCT 42.8 07/08/2021   MCV 93.0 07/08/2021   PLT 310 07/08/2021   Lab Results  Component Value Date   CREATININE 0.70 07/08/2021   BUN 14 07/08/2021   NA 136 07/08/2021   K 4.1 07/08/2021   CL 105 07/08/2021   CO2 23 07/08/2021    Anesthesia Physical Anesthesia Plan  ASA: 2  Anesthesia Plan: General   Post-op Pain Management: Tylenol PO (pre-op)* and Toradol IV (intra-op)*   Induction: Intravenous  PONV Risk Score and Plan: 4 or greater and Dexamethasone, Ondansetron and Treatment may vary due to age or medical condition  Airway Management Planned: LMA  Additional Equipment:   Intra-op Plan:   Post-operative Plan: Extubation in OR  Informed Consent: I have reviewed the patients History and Physical, chart, labs and discussed the procedure including the risks, benefits and alternatives for the proposed anesthesia with the patient or authorized representative who has indicated his/her understanding and acceptance.     Dental advisory given  Plan Discussed with: CRNA  Anesthesia Plan Comments:         Anesthesia Quick Evaluation

## 2022-04-21 NOTE — Discharge Instructions (Addendum)
Hello Ms. Sabrina Tapia,   I was able to get inside your uterus using the ultrasound to guide me.  You did not have a polyp or mass inside the uterus.  The lining looked consistent with thickening due to Tamoxifen.  I did the curettage to send endometrial curettings to the lab for biopsy.   Everything went well!  [name], MD   No acetaminophen/Tylenol until after 1:25pm today if needed for pain.   No ibuprofen, Advil, Aleve, Motrin, ketorolac, meloxicam, naproxen, or other NSAIDS until after 3:00pm today if needed for pain.    DISCHARGE INSTRUCTIONS: HYSTEROSCOPY / ENDOMETRIAL ABLATION The following instructions have been prepared to help you care for yourself upon your return home.  May Remove Scop patch on or before  May take Ibuprofen after  May take stool softner while taking narcotic pain medication to prevent constipation.  Drink plenty of water.  Personal hygiene:  Use sanitary pads for vaginal drainage, not tampons.  Shower the day after your procedure.  NO tub baths, pools or Jacuzzis for 2-3 weeks.  Wipe front to back after using the bathroom.  Activity and limitations:  Do NOT drive or operate any equipment for 24 hours. The effects of anesthesia are still present and drowsiness may result.  Do NOT rest in bed all day.  Walking is encouraged.  Walk up and down stairs slowly.  You may resume your normal activity in one to two days or as indicated by your physician. Sexual activity: NO intercourse for at least 2 weeks after the procedure, or as indicated by your Doctor.  Diet: Eat a light meal as desired this evening. You may resume your usual diet tomorrow.  Return to Work: You may resume your work activities in one to two days or as indicated by Marine scientist.  What to expect after your surgery: Expect to have vaginal bleeding/discharge for 2-3 days and spotting for up to 10 days. It is not unusual to have soreness for up to 1-2 weeks. You may have a slight  burning sensation when you urinate for the first day. Mild cramps may continue for a couple of days. You may have a regular period in 2-6 weeks.  Call your doctor for any of the following:  Excessive vaginal bleeding or clotting, saturating and changing one pad every hour.  Inability to urinate 6 hours after discharge from hospital.  Pain not relieved by pain medication.  Fever of 100.4 F or greater.  Unusual vaginal discharge or odor.    Post Anesthesia Home Care Instructions  Activity: Get plenty of rest for the remainder of the day. A responsible individual must stay with you for 24 hours following the procedure.  For the next 24 hours, DO NOT: -Drive a car -Paediatric nurse -Drink alcoholic beverages -Take any medication unless instructed by your physician -Make any legal decisions or sign important papers.  Meals: Start with liquid foods such as gelatin or soup. Progress to regular foods as tolerated. Avoid greasy, spicy, heavy foods. If nausea and/or vomiting occur, drink only clear liquids until the nausea and/or vomiting subsides. Call your physician if vomiting continues.  Special Instructions/Symptoms: Your throat may feel dry or sore from the anesthesia or the breathing tube placed in your throat during surgery. If this causes discomfort, gargle with warm salt water. The discomfort should disappear within 24 hours.

## 2022-04-21 NOTE — Anesthesia Procedure Notes (Signed)
Procedure Name: LMA Insertion Date/Time: 04/21/2022 8:31 AM  Performed by: Clearnce Sorrel, CRNAPre-anesthesia Checklist: Patient identified, Emergency Drugs available, Suction available and Patient being monitored Patient Re-evaluated:Patient Re-evaluated prior to induction Oxygen Delivery Method: Circle System Utilized Preoxygenation: Pre-oxygenation with 100% oxygen Induction Type: IV induction Ventilation: Mask ventilation without difficulty LMA: LMA inserted LMA Size: 4.0 Number of attempts: 1 Airway Equipment and Method: Bite block Placement Confirmation: positive ETCO2 Tube secured with: Tape Dental Injury: Teeth and Oropharynx as per pre-operative assessment

## 2022-04-21 NOTE — Anesthesia Postprocedure Evaluation (Signed)
Anesthesia Post Note  Patient: Sabrina Tapia  Procedure(s) Performed: DILATATION & CURETTAGE/HYSTEROSCOPY (Uterus) OPERATIVE ULTRASOUND     Patient location during evaluation: PACU Anesthesia Type: General Level of consciousness: awake and alert Pain management: pain level controlled Vital Signs Assessment: post-procedure vital signs reviewed and stable Respiratory status: spontaneous breathing, nonlabored ventilation, respiratory function stable and patient connected to nasal cannula oxygen Cardiovascular status: blood pressure returned to baseline and stable Postop Assessment: no apparent nausea or vomiting Anesthetic complications: no   No notable events documented.  Last Vitals:  Vitals:   04/21/22 0930 04/21/22 1005  BP: 133/79 (!) 152/76  Pulse: 65 64  Resp: 15 16  Temp: (!) 36.3 C 36.4 C  SpO2: 98% 100%    Last Pain:  Vitals:   04/21/22 0930  TempSrc:   PainSc: 3                  [name]

## 2022-04-21 NOTE — Progress Notes (Signed)
Update to History and Physical  No marked change in status since office pre-op visit.   Patient examined.   OK to proceed with surgery. 

## 2022-04-21 NOTE — Op Note (Signed)
OPERATIVE REPORT  PREOPERATIVE DIAGNOSES:   Pelvic pain, endometrial thickening, cervical stenosis, Tamoxifen use.   POSTOPERATIVE DIAGNOSES:    Pelvic pain, endometrial thickening, cervical stenosis, Tamoxifen use.   PROCEDURE:  ultrasound guided hysteroscopy with dilation and curettage    SURGEON:  Lenard Galloway, MD  ANESTHESIA:  LMA, paracervical block with 10 mL of 1% lidocaine.  IV FLUIDS:  350 cc LR  EBL:  5 cc  URINE OUTPUT:   225 cc  NORMAL SALINE DEFICIT:   25 cc   COMPLICATIONS:  None.  INDICATIONS FOR THE PROCEDURE:     The patient is a 71 year old G107P0 Caucasian female who presents with pelvic pain.   The patient takes Tamoxifen and has a history of a benign endometrial polyp resected in 2020.  Pelvic ultrasound on 03/05/22 showed an endometrium measuring 15.81 mm. The lining was heterogeneously thickened and contained a feeder vessel.  She also had several small fibroids ranging in size from 0.48 - 1.53 cm. Her ovaries were atrophic. Endometrial biopsy in the office was not successful due to cervical stenosis.  A plan is now made to proceed with an ultrasound guided hysteroscopy with dilation and curettage and Myosure resection of endometrial thickening, after risks, benefits and alternatives were reviewed.  FINDINGS:  Exam under anesthesia revealed a small anteverted, mobile uterus.  No adnexal masses were noted.  The uterus was sounded to almost 7 cm. Hysteroscopy showed generally thickened endometrium with no specific intracavitary lesions.   The tubal ostia regions were scarred.   Endometrial currettings were scant.  SPECIMENS:   Endometrial curettings to Pathology.  PROCEDURE IN DETAIL:  The patient was reidentified in the preoperative hold area.  She received TED hose and PAS stockings for DVT prophylaxis.  In the operating room, the patient was placed in the dorsal lithotomy position and then an LMA anesthetic was introduced.  The patient's lower abdomen,  vagina and perineum were sterilely prepped with Betadine and she was sterilly draped.  An exam under anesthesia was performed.  A speculum was placed inside the vagina and a single-tooth tenaculum was placed on the anterior cervical lip.  A paracervical block was performed with a total of 10 mL of 1% lidocaine.  Under ultrasound guidance, an attempt was made to sound the uterus.  There was evidence of internal cervical os stenosis.  The tenaculum was then placed on the posterior cervical lip, and half Hegar dilators were used to dilate the cervix under ultrasound guidance.  This was successful.  The uterus was then sounded. The cervix was further dilated to a #19 Pratt dilator.  The MyoSure hysteroscope was then inserted inside the uterine cavity under the continuous infusion of normal saline solution.   Findings are as noted above.    The pressure on the Myosure pump was reduced, and no intracavitary mass was noted.  The hysteroscope was removed.  The cervix was further dilated to a #23 Pratt dilator.   The serrated and then sharp curettes were introduced into the uterine cavity and the endometrium was curetted in all 4 quadrants.   A scant amount of endometrial curettings was obtained.  This specimen was sent to Pathology.  The single-tooth tenaculum which had been placed on the anterior cervical lip was removed.     Hemostasis was good, and all of the vaginal instruments were removed.  The bladder was sterilly catheterized with a red rubber catheter.   The patient was awakened and escorted to the recovery room  in stable condition after she was cleansed of Betadine.  There were no complications to the procedure.   All needle, instrument and sponge counts were correct.  Lenard Galloway, MD

## 2022-04-22 ENCOUNTER — Encounter (HOSPITAL_BASED_OUTPATIENT_CLINIC_OR_DEPARTMENT_OTHER): Payer: Self-pay | Admitting: Obstetrics and Gynecology

## 2022-04-23 LAB — SURGICAL PATHOLOGY

## 2022-04-30 NOTE — Progress Notes (Signed)
GYNECOLOGY  VISIT   HPI: 71 y.o.   Single  Caucasian  female   G0P0 with Patient's last menstrual period was 07/28/1996 (approximate).   here for 2 week post op. U/S guided hysteroscopy with dilation & curettage.  Patient presented with pelvic discomfort and her pelvic US showed endometrium measuring 15.81 mm. She was not having postmenopausal bleeding.  She takes Tamoxifen following her breast cancer treatment.   Findings at surgery were generally thickened endometrium which was not smooth in appearance. Pathology showed scant crushed endometrial and myometrial tissue. This limited the evaluation per pathologist. No definitive atypia, hyperplasia noted.   Patient states it took more time to recover from anesthesia with this surgery.  Bleeding stopped.   Patient has considered hysterectomy.  She had postmenopausal bleeding and a thickened endometrium in 2020, and she underwent a hysteroscopic polypectomy on 12/06/18.  Final pathology showed a benign LUS polyp.  Patient is dealing with sinus symptoms today.   GYNECOLOGIC HISTORY: Patient's last menstrual period was 07/28/1996 (approximate). Contraception:  pmp Menopausal hormone therapy:  none Last mammogram:  07-09-21 category b density birads 2:neg (hx of left breast mastectomy) Last pap smear:   07-02-21 neg, 03-25-19 neg        OB History     Gravida  0   Para      Term      Preterm      AB      Living         SAB      IAB      Ectopic      Multiple      Live Births                 Patient Active Problem List   Diagnosis Date Noted   Degenerative tear of medial meniscus 01/23/2022   Greater trochanteric bursitis of right hip 06/11/2021   Arthritis of carpometacarpal (CMC) joint of left thumb 06/11/2021   Mass of right lung 07/13/2020   Arthritis 08/03/2019   Rosacea 08/03/2019   Pes anserinus bursitis of right knee 03/28/2019   Pain in right knee 03/22/2019   Adhesive capsulitis of right  shoulder 10/18/2018   Pain in right hand 10/05/2018   Tear of biceps tendon 09/21/2018   Moderate persistent asthma, uncomplicated 20/25/4270   Hallux rigidus of right foot 06/15/2017   Left Achilles tendinitis 06/15/2017   History of bilateral breast cancer 04/23/2017   Mild persistent asthma, uncomplicated 62/37/6283   Other seasonal allergic rhinitis 02/12/2017   History of therapeutic radiation 02/11/2017   Malignant neoplasm of overlapping sites of left breast in female, estrogen receptor positive ([place]) 10/27/2016   Sinusitis 09/01/2016   Hyponatremia 09/01/2016   Acquired absence of left breast 07/16/2016   Malignant neoplasm of upper-outer quadrant of right breast in female, estrogen receptor positive ([place]) 04/19/2016   Dizziness 02/19/2016   Sensorineural hearing loss (SNHL), bilateral 02/19/2016   Tinnitus of both ears 02/19/2016   Bunion of great toe of right foot 04/04/2015   Hemorrhoid 11/23/2014   Anal skin tag 11/23/2014   Incontinence of urine 07/28/2014   Retrocalcaneal bursitis 05/11/2014   Pain in joint, ankle and foot 05/11/2014   Heart burn    Joint pain    Abnormal Pap smear    Osteopenia     Past Medical History:  Diagnosis Date   Allergy-induced asthma    prn inhaler   Arterial calcification    sees pcp for takes atorvastatin  for   Asthma    allergy induced   Basal cell carcinoma    right clavicle   Breast CA ([place]) dx'd 2017   left and right   Constipation    Dental crowns present    Endometrial thickening on ultrasound    Family history of adverse reaction to anesthesia    pt's father has hx. of being hard to wake up post-op x 1 time after 8 hour whipple surgery   GERD (gastroesophageal reflux disease)    Hemorrhoids    Hisory of Migraines    History of breast cancer 2017   bilateral   History of radiation therapy    08/19/16- 09/29/16, Right Breast 50 Gy in 25 fractions, Right Breast boost 10 Gy in 5 fractions, Left Breast 50.4 Gy in 28  fractions   Hot flashes    Hypertension    Osteoarthritis    hands and feet   Osteopenia 02/2016   T score -1.3 FRAX 8.3%/0.7% stable from prior DEXA   Overactive bladder    Pneumonia    18 to 19 yrs ago per pt on 04-09-2022   Seasonal allergies    Sleep apnea    uses dental appliance mild /moderate osa   Thyroid nodule 2012   Solitary nodule in the lower pole of the left thyroid lobe , pt unaware   Wears glasses for reading    Wears hearing aid in both ears     Past Surgical History:  Procedure Laterality Date   BREAST RECONSTRUCTION WITH PLACEMENT OF TISSUE EXPANDER AND FLEX HD (ACELLULAR HYDRATED DERMIS) Left 07/01/2016   Procedure: BREAST RECONSTRUCTION WITH PLACEMENT OF TISSUE EXPANDER AND  ALLODERM;  Surgeon: Irene Limbo, MD;  Location: [place];  Service: Plastics;  Laterality: Left;   BROW LIFT     COLONOSCOPY  2011   colonscopy  2022   due every 5 years   [place] N/A 12/06/2018   Procedure: [place];  Surgeon: [name], MD;  Location[facility];  Service: Gynecology;  Laterality: N/A;  follow 1st case   DILATATION & CURETTAGE/HYSTEROSCOPY WITH MYOSURE N/A 04/21/2022   Procedure: DILATATION & CURETTAGE/HYSTEROSCOPY;  Surgeon: [name], MD;  Location: [facility];  Service: Gynecology;  Laterality: N/A;   INCISION AND DRAINAGE Left 06/02/2016   Procedure: DRAINAGE of left axilla seroma;  Surgeon: Rolm Bookbinder, MD;  Location: [place];  Service: General;  Laterality: Left;   LIPOSUCTION WITH LIPOFILLING Left 06/11/2018   Procedure: Lipofilling from abdomen to left chest;  Surgeon: Irene Limbo, MD;  Location: [place];  Service: Plastics;  Laterality: Left;   MASTOPEXY Right 04/17/2017   Procedure: RIGHT MASTOPEXY;  Surgeon: Irene Limbo, MD;  Location: [place];  Service: Plastics;  Laterality: Right;   NIPPLE SPARING MASTECTOMY Left 07/01/2016   Procedure: LEFT NIPPLE SPARING MASTECTOMY;  Surgeon: Rolm Bookbinder, MD;  Location: [place];  Service: General;  Laterality: Left;   OPERATIVE ULTRASOUND N/A 04/21/2022   Procedure: OPERATIVE ULTRASOUND;  Surgeon: [name], MD;  Location: [facility];  Service: Gynecology;  Laterality: N/A;   RADIOACTIVE SEED GUIDED PARTIAL MASTECTOMY WITH AXILLARY SENTINEL LYMPH NODE BIOPSY Right 03/26/2016   Procedure: RIGHT BREAST LUMPECTOMY WITH RADIOACTIVE SEED AND SENTINEL LYMPH NODE BIOPSY;  Surgeon: Rolm Bookbinder, MD;  Location: [place];  Service:  General;  Laterality: Right;   RADIOACTIVE SEED GUIDED PARTIAL MASTECTOMY WITH AXILLARY SENTINEL LYMPH NODE BIOPSY Left 05/20/2016   Procedure: LEFT BREAST RADIOACTIVE SEED (two seeds) GUIDED LUMPECTOMY WITH LEFT AXILLARY SENTINEL LYMPH NODE BIOPSY AND LEFT SEED TARGETED AXILLARY LYMPH NODE EXCISION;  Surgeon: Rolm Bookbinder, MD;  Location: [place];  Service: General;  Laterality: Left;   RE-EXCISION OF BREAST CANCER,SUPERIOR MARGINS Right 05/20/2016   Procedure: RE-EXCISION OF RIGHT BREAST MARGIN;  Surgeon: Rolm Bookbinder, MD;  Location: [place];  Service: General;  Laterality: Right;   RE-EXCISION OF BREAST CANCER,SUPERIOR MARGINS Left 06/02/2016   Procedure: RE-EXCISION OF BREAST CANCER,SUPERIOR MARGINS;  Surgeon: Rolm Bookbinder, MD;  Location: [place];  Service: General;  Laterality: Left;   REMOVAL OF TISSUE EXPANDER AND PLACEMENT OF IMPLANT Left 04/17/2017   Procedure: REMOVAL OF LEFT TISSUE EXPANDER WITH PLACEMENT OF LEFT SILICONE BREAST IMPLANT;  Surgeon: Irene Limbo, MD;  Location: [place];  Service: Plastics;  Laterality: Left;   shoulder surgery for arthritis Left 07/2021   arthroscopic done  at [facility] [place]    Current Outpatient Medications  Medication Sig Dispense Refill   albuterol (PROAIR HFA) 108 (90 Base) MCG/ACT inhaler Inhale 2 puffs into the lungs every 4 (four) hours as needed for wheezing or shortness of breath. 1 Inhaler 1   ALPRAZolam (XANAX) 0.25 MG tablet as needed. Prn for travel     amLODipine (NORVASC) 2.5 MG tablet 1 tablet     atorvastatin (LIPITOR) 10 MG tablet at bedtime.     azelastine (ASTELIN) 0.1 % nasal spray 1 spray daily.     cetirizine (ZYRTEC) 10 MG tablet Take 10 mg by mouth daily.     fluticasone (FLONASE) 50 MCG/ACT nasal spray Place 1 spray into both nostrils daily.     hydrocortisone (ANUSOL-HC) 2.5 % rectal cream Place 1 application rectally as needed for hemorrhoids or itching.     ibuprofen (ADVIL) 600 MG tablet Take 1 tablet (600 mg total) by mouth every 6 (six) hours as needed for mild pain. 30 tablet 0   omeprazole (PRILOSEC) 20 MG capsule Take 20 mg by mouth daily.     OVER THE COUNTER MEDICATION Take by mouth daily. MEGASPOREBIOTIC     psyllium (METAMUCIL) 58.6 % packet Take 1 packet by mouth daily.     RESTASIS 0.05 % ophthalmic emulsion Place 1 drop into both eyes as needed.     tamoxifen (NOLVADEX) 20 MG tablet Take 1 tablet (20 mg total) by mouth daily. (Patient taking differently: Take 20 mg by mouth at bedtime.) 90 tablet 3   triamcinolone (KENALOG) 0.1 % as needed.     valACYclovir (VALTREX) 1000 MG tablet Take 1 tablet (1,000 mg total) by mouth 2 (two) times daily. Take for 1 day as directed above as needed. (Patient taking differently: Take 1,000 mg by mouth 2 (two) times daily as needed. Take for 1 day as directed above as needed.) 30 tablet 1   valsartan-hydrochlorothiazide (DIOVAN-HCT) 160-12.5 MG tablet Take 1 tablet by mouth daily.     venlafaxine XR (EFFEXOR-XR) 75 MG 24 hr capsule TAKE 1 CAPSULE(75 MG) BY MOUTH DAILY WITH BREAKFAST 90 capsule 4   VITAMIN D PO Take 1,000 Int'l Units by mouth.     FLOVENT  HFA 110 MCG/ACT inhaler INHALE 1 PUFF BY MOUTH INTO THE LUNGS TWICE DAILY (Patient taking differently: 2 puffs as needed.) 12 g 0   No current facility-administered medications for  this visit.     ALLERGIES: Bacitracin, Hydrocodone-acetaminophen, Other, Penicillins, Adhesive [tape], Augmentin [amoxicillin-pot clavulanate], and Penicillamine  Family History  Problem Relation Age of Onset   Hypertension Mother    Heart disease Mother    Lung disease Mother    Hypertension Father    Cancer Father        colon   Heart disease Father    Lung cancer Father    Anesthesia problems Father        hard to wake up post-op   Breast cancer Paternal 23        Age 91's   Hypertension Brother    Hyperlipidemia Brother    Diabetes Maternal Grandfather     Social History   Socioeconomic History   Marital status: Single    Spouse name: Not on file   Number of children: Not on file   Years of education: Not on file   Highest education level: Not on file  Occupational History   Not on file  Tobacco Use   Smoking status: Former    Packs/day: 1.00    Years: 25.00    Total pack years: 25.00    Types: Cigarettes    Quit date: 07/27/1990    Years since quitting: 31.7   Smokeless tobacco: Never  Vaping Use   Vaping Use: Never used  Substance and Sexual Activity   Alcohol use: Yes    Comment: 2-3 wine spritzer  per day   Drug use: Not Currently   Sexual activity: Not Currently    Partners: Male    Birth control/protection: Post-menopausal    Comment: 1st intercourse 86 yo-5 partners  Other Topics Concern   Not on file  Social History Narrative   Not on file   Social Determinants of Health   Financial Resource Strain: Not on file  Food Insecurity: Not on file  Transportation Needs: Not on file  Physical Activity: Not on file  Stress: Not on file  Social Connections: Not on file  Intimate Partner Violence: Not on file    Review of Systems  Constitutional: Negative.    HENT: Negative.    Eyes: Negative.   Respiratory: Negative.    Cardiovascular: Negative.   Gastrointestinal: Negative.   Endocrine: Negative.   Genitourinary: Negative.   Musculoskeletal: Negative.   Skin: Negative.   Allergic/Immunologic: Negative.   Neurological: Negative.   Hematological: Negative.   Psychiatric/Behavioral: Negative.      PHYSICAL EXAMINATION:    BP 110/74   Pulse 94   Wt 158 lb (71.7 kg)   LMP 07/28/1996 (Approximate)   SpO2 98%   BMI 27.12 kg/m     General appearance: alert, cooperative and appears stated age  Pelvic: External genitalia:  no lesions              Urethra:  normal appearing urethra with no masses, tenderness or lesions              Bartholins and Skenes: normal                 Vagina: normal appearing vagina with normal color and discharge, no lesions              Cervix: no lesions.  Ecchymoses/petechiae of the cervix noted.                 Bimanual Exam:  Uterus:  normal size, contour, position, consistency, mobility, non-tender  Adnexa: no mass, fullness, tenderness           Chaperone was present for exam:  yes.  ASSESSMENT  Status post ultrasound guided hysteroscopy with dilation and curettage.  Pathology specimen limited to make a diagnosis. Thickened endometrium.  Tamoxifen use.  Sinusitis.  PLAN  Surgical findings, procedure and pathology report reviewed.  We discussed option of repeat endometrial biopsy in the office now that her cervix has been dilated intraoperatively.   She declines this option.  She understands that she is at increased risk for endometrial hyperplasia and malignancy due to her Tamoxifen use.  Will refer to [place] for evaluation and potential definitive surgical treatment.  I encouraged her to also contact her medical oncologist as this is the second surgical intervention due to a thickened endometrial lining while on Tamoxifen.  I recommend she do a Covid test. FU here prn.    An  After Visit Summary was printed and given to the patient.  22 min  total time was spent for this patient encounter, including preparation, face-to-face counseling with the patient, coordination of care, and documentation of the encounter.

## 2022-05-01 ENCOUNTER — Encounter: Payer: Self-pay | Admitting: Obstetrics and Gynecology

## 2022-05-01 ENCOUNTER — Ambulatory Visit (INDEPENDENT_AMBULATORY_CARE_PROVIDER_SITE_OTHER): Payer: Medicare PPO | Admitting: Obstetrics and Gynecology

## 2022-05-01 VITALS — BP 110/74 | HR 94 | Wt 158.0 lb

## 2022-05-01 DIAGNOSIS — R9389 Abnormal findings on diagnostic imaging of other specified body structures: Secondary | ICD-10-CM

## 2022-05-01 DIAGNOSIS — Z79810 Long term (current) use of selective estrogen receptor modulators (SERMs): Secondary | ICD-10-CM

## 2022-05-01 DIAGNOSIS — Z9889 Other specified postprocedural states: Secondary | ICD-10-CM

## 2022-05-02 DIAGNOSIS — M205X1 Other deformities of toe(s) (acquired), right foot: Secondary | ICD-10-CM | POA: Diagnosis not present

## 2022-05-02 DIAGNOSIS — Z7189 Other specified counseling: Secondary | ICD-10-CM | POA: Diagnosis not present

## 2022-05-03 ENCOUNTER — Telehealth: Payer: Self-pay | Admitting: Obstetrics and Gynecology

## 2022-05-03 DIAGNOSIS — Z9889 Other specified postprocedural states: Secondary | ICD-10-CM

## 2022-05-03 DIAGNOSIS — R9389 Abnormal findings on diagnostic imaging of other specified body structures: Secondary | ICD-10-CM

## 2022-05-03 DIAGNOSIS — Z79810 Long term (current) use of selective estrogen receptor modulators (SERMs): Secondary | ICD-10-CM

## 2022-05-03 NOTE — Telephone Encounter (Signed)
Please make referral to Dr. Valarie Cones for my patient.   She has thickened endometrium, Tamoxifen use, and a limited specimen for analysis on her recent hysteroscopy/dilation and curettage specimen.  This is her second hysteroscopy procedure while on Tamoxifen.   She is considering hysterectomy for definitive surgical therapy.   She has a history of breast cancer.

## 2022-05-05 NOTE — Telephone Encounter (Signed)
Referral placed at [facility] cancer center they will call patient to schedule.

## 2022-05-05 NOTE — Progress Notes (Signed)
Sabrina Tapia [address] [place] Phone: [phone] Subjective:   Sabrina Tapia, am serving as a scribe for Dr. Hulan Saas.  I'm seeing this patient by the request  of:  Josetta Huddle, MD  CC: hand and foot pain   IDP:[id number]  03/06/2022 Patient does have arthritic changes.  Have gotten custom orthotics previously.  Has tried over-the-counter orthotics as well and did not have any significant improvement.  Patient has seen an orthopedic surgeon and is discussing the possibility of fusion.  Patient brought an x-ray today that we did review that did show the patient had some asymmetric arthritic changes noted.  Patient will follow-up with me again in 4 to 6 weeks if she has any other questions on this.  Patient given injection and tolerated the procedure well, discussed icing regimen and home exercises,Patient has responded to the injection previously and hopefully will respond again.  Follow-up again in 6 to 8 weeks   Updated 05/07/2022 Sabrina Tapia is a 71 y.o. female coming in with complaint of toe and thumb pain. Injection in thumb lasted about a month. Same pain is back with high intensity at times. Having surgery on her toe next month. Feels like she pulled a muscle in her neck on the left side. No other concerns.  Patient had been recently ill. States it was the flu had fever but no fever for 48 hours       Past Medical History:  Diagnosis Date   Allergy-induced asthma    prn inhaler   Arterial calcification    sees pcp for takes atorvastatin  for   Asthma    allergy induced   Basal cell carcinoma    right clavicle   Breast CA ([place]) dx'd 2017   left and right   Constipation    Dental crowns present    Endometrial thickening on ultrasound    Family history of adverse reaction to anesthesia    pt's father has hx. of being hard to wake up post-op x 1 time after 8 hour whipple surgery   GERD (gastroesophageal reflux  disease)    Hemorrhoids    Hisory of Migraines    History of breast cancer 2017   bilateral   History of radiation therapy    08/19/16- 09/29/16, Right Breast 50 Gy in 25 fractions, Right Breast boost 10 Gy in 5 fractions, Left Breast 50.4 Gy in 28 fractions   Hot flashes    Hypertension    Osteoarthritis    hands and feet   Osteopenia 02/2016   T score -1.3 FRAX 8.3%/0.7% stable from prior DEXA   Overactive bladder    Pneumonia    18 to 19 yrs ago per pt on 04-09-2022   Seasonal allergies    Sleep apnea    uses dental appliance mild /moderate osa   Thyroid nodule 2012   Solitary nodule in the lower pole of the left thyroid lobe , pt unaware   Wears glasses for reading    Wears hearing aid in both ears    Past Surgical History:  Procedure Laterality Date   BREAST RECONSTRUCTION WITH PLACEMENT OF TISSUE EXPANDER AND FLEX HD (ACELLULAR HYDRATED DERMIS) Left 07/01/2016   Procedure: BREAST RECONSTRUCTION WITH PLACEMENT OF TISSUE EXPANDER AND  ALLODERM;  Surgeon: Irene Limbo, MD;  Location: [facility];  Service: Plastics;  Laterality: Left;   BROW LIFT     COLONOSCOPY  2011   colonscopy  2022   due every 5 years   [place] N/A 12/06/2018   Procedure: [place];  Surgeon: [name], MD;  Location: [facility];  Service: Gynecology;  Laterality: N/A;  follow 1st case   DILATATION & CURETTAGE/HYSTEROSCOPY WITH MYOSURE N/A 04/21/2022   Procedure: DILATATION & CURETTAGE/HYSTEROSCOPY;  Surgeon: [name], MD;  Location: [facility];  Service: Gynecology;  Laterality: N/A;   INCISION AND DRAINAGE Left 06/02/2016   Procedure: DRAINAGE of left axilla seroma;  Surgeon: Rolm Bookbinder, MD;  Location: [facility];  Service: General;  Laterality: Left;   LIPOSUCTION WITH LIPOFILLING Left 06/11/2018   Procedure: Lipofilling  from abdomen to left chest;  Surgeon: Irene Limbo, MD;  Location: [facility];  Service: Plastics;  Laterality: Left;   MASTOPEXY Right 04/17/2017   Procedure: RIGHT MASTOPEXY;  Surgeon: Irene Limbo, MD;  Location: [facility];  Service: Plastics;  Laterality: Right;   NIPPLE SPARING MASTECTOMY Left 07/01/2016   Procedure: LEFT NIPPLE SPARING MASTECTOMY;  Surgeon: Rolm Bookbinder, MD;  Location: [facility];  Service: General;  Laterality: Left;   OPERATIVE ULTRASOUND N/A 04/21/2022   Procedure: OPERATIVE ULTRASOUND;  Surgeon: [name], MD;  Location[facility];  Service: Gynecology;  Laterality: N/A;   RADIOACTIVE SEED GUIDED PARTIAL MASTECTOMY WITH AXILLARY SENTINEL LYMPH NODE BIOPSY Right 03/26/2016   Procedure: RIGHT BREAST LUMPECTOMY WITH RADIOACTIVE SEED AND SENTINEL LYMPH NODE BIOPSY;  Surgeon: Rolm Bookbinder, MD;  Location: [facility];  Service: General;  Laterality: Right;   RADIOACTIVE SEED GUIDED PARTIAL MASTECTOMY WITH AXILLARY SENTINEL LYMPH NODE BIOPSY Left 05/20/2016   Procedure: LEFT BREAST RADIOACTIVE SEED (two seeds) GUIDED LUMPECTOMY WITH LEFT AXILLARY SENTINEL LYMPH NODE BIOPSY AND LEFT SEED TARGETED AXILLARY LYMPH NODE EXCISION;  Surgeon: Rolm Bookbinder, MD;  Location: [facility];  Service: General;  Laterality: Left;   RE-EXCISION OF BREAST CANCER,SUPERIOR MARGINS Right 05/20/2016   Procedure: RE-EXCISION OF RIGHT BREAST MARGIN;  Surgeon: Rolm Bookbinder, MD;  Location: [facility];  Service: General;  Laterality: Right;   RE-EXCISION OF BREAST CANCER,SUPERIOR MARGINS Left 06/02/2016   Procedure: RE-EXCISION OF BREAST CANCER,SUPERIOR MARGINS;  Surgeon: Rolm Bookbinder, MD;  Location: [facility];  Service: General;  Laterality: Left;   REMOVAL OF TISSUE EXPANDER AND PLACEMENT OF IMPLANT Left 04/17/2017   Procedure: REMOVAL  OF LEFT TISSUE EXPANDER WITH PLACEMENT OF LEFT SILICONE BREAST IMPLANT;  Surgeon: Irene Limbo, MD;  Location: [facility];  Service: Plastics;  Laterality: Left;   shoulder surgery for arthritis Left 07/2021   arthroscopic done at [facility] [place]   Social History   Socioeconomic History   Marital status: Single    Spouse name: Not on file   Number of children: Not on file   Years of education: Not on file   Highest education level: Not on file  Occupational History   Not on file  Tobacco Use   Smoking status: Former    Packs/day: 1.00    Years: 25.00    Total pack years: 25.00    Types: Cigarettes    Quit date: 07/27/1990    Years since quitting: 31.8   Smokeless tobacco: Never  Vaping Use   Vaping Use: Never used  Substance and Sexual Activity   Alcohol use: Yes    Comment: 2-3 wine spritzer  per day   Drug use: Not Currently   Sexual activity: Not Currently    Partners: Male    Birth control/protection: Post-menopausal    Comment: 1st intercourse 89 yo-5 partners  Other Topics Concern   Not on file  Social History Narrative   Not on file   Social Determinants of Health   Financial Resource Strain: Not on file  Food Insecurity: Not on file  Transportation Needs: Not on file  Physical Activity: Not on file  Stress: Not on file  Social Connections: Not on file   Allergies  Allergen Reactions   Bacitracin Swelling   Hydrocodone-Acetaminophen Other (See Comments)    "Nervous energy cannot sleep"   Other     Mold causes seasonal allergies and cough and throat clearing"    Penicillins Itching   Adhesive [Tape] Other (See Comments)    SKIN IRRITATION   Augmentin [Amoxicillin-Pot Clavulanate] Itching   Penicillamine Rash   Family History  Problem Relation Age of Onset   Hypertension Mother    Heart disease Mother    Lung disease Mother    Hypertension Father    Cancer Father        colon   Heart disease Father    Lung  cancer Father    Anesthesia problems Father        hard to wake up post-op   Breast cancer Paternal Grandmother        Age 39's   Hypertension Brother    Hyperlipidemia Brother    Diabetes Maternal Grandfather      Current Outpatient Medications (Cardiovascular):    amLODipine (NORVASC) 2.5 MG tablet, 1 tablet   atorvastatin (LIPITOR) 10 MG tablet, at bedtime.   valsartan-hydrochlorothiazide (DIOVAN-HCT) 160-12.5 MG tablet, Take 1 tablet by mouth daily.  Current Outpatient Medications (Respiratory):    albuterol (PROAIR HFA) 108 (90 Base) MCG/ACT inhaler, Inhale 2 puffs into the lungs every 4 (four) hours as needed for wheezing or shortness of breath.   azelastine (ASTELIN) 0.1 % nasal spray, 1 spray daily.   cetirizine (ZYRTEC) 10 MG tablet, Take 10 mg by mouth daily.   FLOVENT HFA 110 MCG/ACT inhaler, INHALE 1 PUFF BY MOUTH INTO THE LUNGS TWICE DAILY (Patient taking differently: 2 puffs as needed.)   fluticasone (FLONASE) 50 MCG/ACT nasal spray, Place 1 spray into both nostrils daily.  Current Outpatient Medications (Analgesics):    ibuprofen (ADVIL) 600 MG tablet, Take 1 tablet (600 mg total) by mouth every 6 (six) hours as needed for mild pain.   Current Outpatient Medications (Other):    ALPRAZolam (XANAX) 0.25 MG tablet, as needed. Prn for travel   hydrocortisone (ANUSOL-HC) 2.5 % rectal cream, Place 1 application rectally as needed for hemorrhoids or itching.   omeprazole (PRILOSEC) 20 MG capsule, Take 20 mg by mouth daily.   OVER THE COUNTER MEDICATION, Take by mouth daily. MEGASPOREBIOTIC   psyllium (METAMUCIL) 58.6 % packet, Take 1 packet by mouth daily.   RESTASIS 0.05 % ophthalmic emulsion, Place 1 drop into both eyes as needed.   tamoxifen (NOLVADEX) 20 MG tablet, Take 1 tablet (20 mg total) by mouth daily. (Patient taking differently: Take 20 mg by mouth at bedtime.)   triamcinolone (KENALOG) 0.1 %, as needed.   valACYclovir (VALTREX) 1000 MG tablet, Take 1 tablet  (1,000 mg total) by mouth 2 (two) times daily. Take for 1 day as directed above as needed. (Patient taking differently: Take 1,000 mg by mouth 2 (two) times daily as needed. Take  for 1 day as directed above as needed.)   venlafaxine XR (EFFEXOR-XR) 75 MG 24 hr capsule, TAKE 1 CAPSULE(75 MG) BY MOUTH DAILY WITH BREAKFAST   VITAMIN D PO, Take 1,000 Int'l Units by mouth.   Reviewed prior external information including notes and imaging from  primary care provider As well as notes that were available from care everywhere and other healthcare systems.  Past medical history, social, surgical and family history all reviewed in electronic medical record.  No pertanent information unless stated regarding to the chief complaint.   Review of Systems:  No , visual changes, nausea, vomiting, diarrhea, constipation,  abdominal pain, skin rash, fevers, chills, night sweats, weight loss, swollen lymph nodes, body aches, joint swelling, chest pain, shortness of breath, mood changes. POSITIVE muscle aches, dizziness, headache  Objective  Blood pressure 110/70, height '5\' 4"'$  (1.626 m), weight 157 lb (71.2 kg), last menstrual period 07/28/1996.   General: No apparent distress alert and oriented x3 mood and affect normal, dressed appropriately.  HEENT: Pupils equal, extraocular movements intact  Respiratory: Patient's speak in full sentences and does not appear short of breath  Cardiovascular: No lower extremity edema, non tender, no erythema  Neck exam does have some loss of lordosis.  Some tenderness to palpation over the paraspinal musculature.  Lacks last 10 degrees of extension.  Unfortunately with extension of the neck though patient does get significant dizziness.  Seems to be left greater than right.  No carotid bruit noted.  When patient brings her head back to a neutral position takes several seconds for patient to regain some coordination.  No significant weakness of the upper extremities.  Left CMC  joint significant decrease in the swelling noted previously.  Patient does have positive grind test still noted.  Very mild atrophy of the thenar eminence  Limited muscular skeletal ultrasound was performed and interpreted by Hulan Saas, M  Limited ultrasound of patient's [facility] joint does show some hypoechoic changes but it is less intense than previous exam.  Patient does have what appears to be a calcific mass that is likely a loose body that does seem to move into the plate.  Does have a fairly large spur noted on the joint as well. Impression: CMC arthritis with spurring and questionable loose body    Impression and Recommendations:    The above documentation has been reviewed and is accurate and complete Lyndal Pulley, DO

## 2022-05-06 ENCOUNTER — Telehealth: Payer: Self-pay

## 2022-05-06 NOTE — Telephone Encounter (Signed)
Spoke with the patient regarding the referral to GYN oncology. Patient scheduled as new patient with Dr Jerene Canny on 05/13/2022. Patient given an arrival time of 10:00am.  Explained to the patient the the doctor will perform a pelvic exam at this visit. Patient given the policy that no visitors under the 16 yrs are allowed in the [facility]. Patient given the address/phone number for the clinic and that the center offers free valet service.

## 2022-05-06 NOTE — Telephone Encounter (Signed)
Encounter reviewed and closed.  

## 2022-05-06 NOTE — Telephone Encounter (Signed)
Patient scheduled on 05/13/22 with Dr. Berline Lopes.

## 2022-05-07 ENCOUNTER — Ambulatory Visit: Payer: Medicare PPO | Admitting: Family Medicine

## 2022-05-07 ENCOUNTER — Ambulatory Visit: Payer: Self-pay

## 2022-05-07 VITALS — BP 110/70 | Ht 64.0 in | Wt 157.0 lb

## 2022-05-07 DIAGNOSIS — M1812 Unilateral primary osteoarthritis of first carpometacarpal joint, left hand: Secondary | ICD-10-CM

## 2022-05-07 DIAGNOSIS — M24042 Loose body in left finger joint(s): Secondary | ICD-10-CM | POA: Diagnosis not present

## 2022-05-07 DIAGNOSIS — R42 Dizziness and giddiness: Secondary | ICD-10-CM | POA: Diagnosis not present

## 2022-05-07 DIAGNOSIS — M542 Cervicalgia: Secondary | ICD-10-CM

## 2022-05-07 DIAGNOSIS — M79671 Pain in right foot: Secondary | ICD-10-CM | POA: Diagnosis not present

## 2022-05-07 NOTE — Assessment & Plan Note (Signed)
Dizziness noted with extension of the neck. No weakness noted.  Patient has worsening symptoms, I did discuss with patient that she needs to seek medical attention.  Concern for potential blood flow and I do feel that an urgent MR angiogram would be done.  Did recently have an illness that could also be contributing.  Did not do any significant labs at the moment.  Vitals were unremarkable today.  We will try to check in with patient as well.

## 2022-05-07 NOTE — Patient Instructions (Addendum)
Referral to Dr. Sherren [facility] [phone] Call Tomorrow  When we receive your results we will contact you.   Worsen headache dizziness lightheadedness seek medical attention

## 2022-05-07 NOTE — Assessment & Plan Note (Signed)
Patient no increase in hypoechoic changes but patient does have what appears to be a potential loose body in there.  Would like to send her to hand surgeon to see if there is a chance that there is any other surgical intervention less than replacement if patient decides to do more conservative treatment options for this problem can come back.

## 2022-05-08 ENCOUNTER — Encounter: Payer: Self-pay | Admitting: Family Medicine

## 2022-05-08 ENCOUNTER — Encounter: Payer: Self-pay | Admitting: Gynecologic Oncology

## 2022-05-08 NOTE — Addendum Note (Signed)
Addended by: Carmie Kanner on: 05/08/2022 12:57 PM   Modules accepted: Orders

## 2022-05-13 ENCOUNTER — Inpatient Hospital Stay: Payer: Medicare PPO | Attending: Gynecologic Oncology | Admitting: Gynecologic Oncology

## 2022-05-13 ENCOUNTER — Inpatient Hospital Stay (HOSPITAL_BASED_OUTPATIENT_CLINIC_OR_DEPARTMENT_OTHER): Payer: Medicare PPO | Admitting: Gynecologic Oncology

## 2022-05-13 ENCOUNTER — Encounter (HOSPITAL_BASED_OUTPATIENT_CLINIC_OR_DEPARTMENT_OTHER): Payer: Self-pay | Admitting: Gynecologic Oncology

## 2022-05-13 ENCOUNTER — Encounter: Payer: Self-pay | Admitting: Gynecologic Oncology

## 2022-05-13 VITALS — BP 124/78 | HR 72 | Temp 98.4°F | Resp 16 | Wt 162.2 lb

## 2022-05-13 DIAGNOSIS — R9389 Abnormal findings on diagnostic imaging of other specified body structures: Secondary | ICD-10-CM | POA: Insufficient documentation

## 2022-05-13 DIAGNOSIS — C50919 Malignant neoplasm of unspecified site of unspecified female breast: Secondary | ICD-10-CM | POA: Insufficient documentation

## 2022-05-13 DIAGNOSIS — Z79810 Long term (current) use of selective estrogen receptor modulators (SERMs): Secondary | ICD-10-CM | POA: Diagnosis not present

## 2022-05-13 DIAGNOSIS — R102 Pelvic and perineal pain: Secondary | ICD-10-CM

## 2022-05-13 DIAGNOSIS — Z170 Estrogen receptor positive status [ER+]: Secondary | ICD-10-CM

## 2022-05-13 DIAGNOSIS — C50411 Malignant neoplasm of upper-outer quadrant of right female breast: Secondary | ICD-10-CM

## 2022-05-13 DIAGNOSIS — Z853 Personal history of malignant neoplasm of breast: Secondary | ICD-10-CM

## 2022-05-13 NOTE — Progress Notes (Unsigned)
GYNECOLOGIC ONCOLOGY NEW PATIENT CONSULTATION   Patient Name: Sabrina Tapia  Patient Age: 71 y.o. Date of Service: 05/13/22 Referring Provider: Dr. [name]  Primary Care Provider: Josetta Huddle, MD Consulting Provider: Jeral Pinch, MD   Assessment/Plan:  Postmenopausal patient with thickened endometrium on tamoxifen with recent scant sampling that was somewhat limited by crush artifact.  I discussed recent endometrial sampling with the patient in the findings did not show hyperplasia or malignancy but unfortunately were somewhat limited due to crush artifact as well as scant sampling.  We reviewed the mechanism of action of tamoxifen and that it acts as an agonist in some tissue, notably the endometrium.  Tamoxifen is associated with increased risk of endometrial overgrowth and the development of hyperplasia and malignancy.  Most studies estimate a 2-3 fold increase in the risk of endometrial cancer with tamoxifen use.  There also appears to be an increased risk of some high risk endometrial cancer histologies with tamoxifen use.  Given prior history with endometrial thickening requiring assessment in the OR, I think it would be reasonable to proceed with definitive surgery in the form of a hysterectomy.  I would recommend that we plan to send the uterus for frozen section at the time of surgery to help guide any necessary procedures.  Alternatively, we discussed the option of additional sampling under hysteroscopic guidance.  The patient has a golf trip scheduled at the end of October and is having toe surgery in mid November.  She is very much looking forward to a trip that she has in February, which will include a significant amount of walking that she has not been able to do because of her foot problems.  After discussing management options, her preference is not to have to cancel the surgery or her trip.  In that case, my recommendation is that we proceed with further diagnostic  surgery in the form of hysteroscopy with endometrial sampling.  If malignancy encountered, then we will discuss management options after pathology returns.  In the setting of endometrial intraepithelial neoplasia, we could treat this medically until she is ready for definitive surgery.  If further sampling is benign, then no additional surgery would be necessary at this time.  We discussed plan for hysteroscopy, endometrial sampling, any other indicated procedures.  Perioperative teaching was performed today.  DVT prophylaxis and antibiotics will be ordered as indicated.  A copy of this note was sent to the patient's referring provider.   65 minutes of total time was spent for this patient encounter, including preparation, face-to-face counseling with the patient and coordination of care, and documentation of the encounter.   Jeral Pinch, MD  Division of Gynecologic Oncology  Department of Obstetrics and Gynecology  [facility]  ___________________________________________  Chief Complaint: Chief Complaint  Patient presents with   Thickened endometrium    History of Present Illness:  Sabrina Tapia is a 71 y.o. y.o. female who is seen in consultation at the request of Dr. Quincy Simmonds for an evaluation of thickened endometrium.   Patient postmenopausal bleeding in 2020 and was found to have a thickened endometrium.  She underwent hysteroscopic polypectomy with final pathology showing a benign uterine segment polyp.  Patient presented with pelvic discomfort, no postmenopausal bleeding since 2020.  She is on tamoxifen for her history of breast cancer.  Patient on 03/16/2022 shows a uterus measuring 6.5 x 5 x 3.8 cm with multiple fibroids measuring up to 1.5 cm.  Endometrial lining measures 15.8 mm  and is heterogenous with feeder vessel.  Bilateral ovaries normal in appearance.  No adnexal masses or free fluid.  Hysteroscopy with D&C was performed with findings of  generally thickened endometrium.  Endometrial curettage on 04/21/2022 shows scant crushed endometrial and myometrial tissue.  While evaluation is limited by crush artifact, no definitive evidence of atypia or hyperplasia identified.  The patient's history is notable for ER/PR+ breast cancer on Tamoxifen for a planned 10 years (started in 03/2016).  Today, she endorses about 6 weeks of some intermittent pelvic pressure.  She thought this was a urinary tract infection initially.  Had her urine tested which was negative for infection.  She describes the pressure is occasionally painful.  This was initially right-sided pelvic pain when she emptied her bladder.  She endorses a good appetite without nausea or emesis.  She has some constipation at baseline, uses fiber and Metamucil.  She denies any urinary symptoms now.  PAST MEDICAL HISTORY:  Past Medical History:  Diagnosis Date   Allergy-induced asthma    prn inhaler   Arterial calcification    sees pcp for takes atorvastatin  for   Asthma    allergy induced   Breast CA ([place]) dx'd 2017   left and right   CAD (coronary artery disease)    Constipation    Endometrial thickening on ultrasound    Family history of adverse reaction to anesthesia    pt's father has hx. of being hard to wake up post-op x 1 time after 8 hour whipple surgery   GERD (gastroesophageal reflux disease)    Hemorrhoids    Hisory of Migraines    History of basal cell carcinoma (BCC) excision    right clavicle   History of breast cancer 2017   bilateral   History of radiation therapy    08/19/16- 09/29/16, Right Breast 50 Gy in 25 fractions, Right Breast boost 10 Gy in 5 fractions, Left Breast 50.4 Gy in 28 fractions   Hypertension    OSA (obstructive sleep apnea)    uses dental appliance mild /moderate osa   Osteoarthritis    hands and feet   Osteopenia 02/2016   T score -1.3 FRAX 8.3%/0.7% stable from prior DEXA   Overactive bladder    Seasonal allergies    Thyroid  nodule 2012   Solitary nodule in the lower pole of the left thyroid lobe , pt unaware   Wears glasses for reading    Wears hearing aid in both ears      PAST SURGICAL HISTORY:  Past Surgical History:  Procedure Laterality Date   BREAST RECONSTRUCTION WITH PLACEMENT OF TISSUE EXPANDER AND FLEX HD (ACELLULAR HYDRATED DERMIS) Left 07/01/2016   Procedure: BREAST RECONSTRUCTION WITH PLACEMENT OF TISSUE EXPANDER AND  ALLODERM;  Surgeon: Irene Limbo, MD;  Location: [place];  Service: Plastics;  Laterality: Left;   BROW LIFT     COLONOSCOPY  2011   colonscopy  2022   due every 5 years   [place] N/A 12/06/2018   Procedure: [place];  Surgeon: [name], MD;  Location: [facility];  Service: Gynecology;  Laterality: N/A;  follow 1st case   DILATATION & CURETTAGE/HYSTEROSCOPY WITH MYOSURE N/A 04/21/2022   Procedure: DILATATION & CURETTAGE/HYSTEROSCOPY;  Surgeon: [name], MD;  Location: [facility];  Service: Gynecology;  Laterality: N/A;   INCISION AND DRAINAGE Left 06/02/2016   Procedure: DRAINAGE of  left axilla seroma;  Surgeon: Rolm Bookbinder, MD;  Location: [place];  Service: General;  Laterality: Left;   LIPOSUCTION WITH LIPOFILLING Left 06/11/2018   Procedure: Lipofilling from abdomen to left chest;  Surgeon: Irene Limbo, MD;  Location: [place];  Service: Plastics;  Laterality: Left;   MASTOPEXY Right 04/17/2017   Procedure: RIGHT MASTOPEXY;  Surgeon: Irene Limbo, MD;  Location: [place];  Service: Plastics;  Laterality: Right;   NIPPLE SPARING MASTECTOMY Left 07/01/2016   Procedure: LEFT NIPPLE SPARING MASTECTOMY;  Surgeon: Rolm Bookbinder, MD;  Location: [place];  Service: General;  Laterality: Left;   OPERATIVE ULTRASOUND N/A 04/21/2022    Procedure: OPERATIVE ULTRASOUND;  Surgeon: [name], MD;  Location: [facility];  Service: Gynecology;  Laterality: N/A;   RADIOACTIVE SEED GUIDED PARTIAL MASTECTOMY WITH AXILLARY SENTINEL LYMPH NODE BIOPSY Right 03/26/2016   Procedure: RIGHT BREAST LUMPECTOMY WITH RADIOACTIVE SEED AND SENTINEL LYMPH NODE BIOPSY;  Surgeon: Rolm Bookbinder, MD;  Location: [place];  Service: General;  Laterality: Right;   RADIOACTIVE SEED GUIDED PARTIAL MASTECTOMY WITH AXILLARY SENTINEL LYMPH NODE BIOPSY Left 05/20/2016   Procedure: LEFT BREAST RADIOACTIVE SEED (two seeds) GUIDED LUMPECTOMY WITH LEFT AXILLARY SENTINEL LYMPH NODE BIOPSY AND LEFT SEED TARGETED AXILLARY LYMPH NODE EXCISION;  Surgeon: Rolm Bookbinder, MD;  Location: [place];  Service: General;  Laterality: Left;   RE-EXCISION OF BREAST CANCER,SUPERIOR MARGINS Right 05/20/2016   Procedure: RE-EXCISION OF RIGHT BREAST MARGIN;  Surgeon: Rolm Bookbinder, MD;  Location: [place];  Service: General;  Laterality: Right;   RE-EXCISION OF BREAST CANCER,SUPERIOR MARGINS Left 06/02/2016   Procedure: RE-EXCISION OF BREAST CANCER,SUPERIOR MARGINS;  Surgeon: Rolm Bookbinder, MD;  Location: [place];  Service: General;  Laterality: Left;   REMOVAL OF TISSUE EXPANDER AND PLACEMENT OF IMPLANT Left 04/17/2017   Procedure: REMOVAL OF LEFT TISSUE EXPANDER WITH PLACEMENT OF LEFT SILICONE BREAST IMPLANT;  Surgeon: Irene Limbo, MD;  Location: [place];  Service: Plastics;  Laterality: Left;   shoulder surgery for arthritis Left 07/2021   arthroscopic done at surgical center of [place]    OB/GYN HISTORY:  OB History  Gravida Para Term Preterm AB Living  0            SAB IAB Ectopic Multiple Live Births               Patient's last menstrual period was 07/28/1996 (approximate).  Age at menarche: 33 Age at menopause: 56 Hx of HRT: Yes,  was previously on HRT; Tamoxifen therapy currently Hx of STDs: Denies Last pap: 06/2021- [facility] History of abnormal pap smears: Denies  SCREENING STUDIES:  Last mammogram: 2022  Last colonoscopy: 2011  MEDICATIONS: Outpatient Encounter Medications as of 05/13/2022  Medication Sig   albuterol (PROAIR HFA) 108 (90 Base) MCG/ACT inhaler Inhale 2 puffs into the lungs every 4 (four) hours as needed for wheezing or shortness of breath.   ALPRAZolam (XANAX) 0.25 MG tablet as needed. Prn for travel   amLODipine (NORVASC) 2.5 MG tablet 1 tablet   atorvastatin (LIPITOR) 10 MG tablet at bedtime.   azelastine (ASTELIN) 0.1 % nasal spray 1 spray daily.   cetirizine (ZYRTEC) 10 MG tablet Take 10 mg by mouth daily.   FLOVENT HFA 110 MCG/ACT inhaler INHALE 1 PUFF BY MOUTH INTO THE LUNGS TWICE DAILY (Patient taking differently: 2 puffs as needed.)   fluticasone (FLONASE) 50 MCG/ACT  nasal spray Place 1 spray into both nostrils daily.   hydrocortisone (ANUSOL-HC) 2.5 % rectal cream Place 1 application rectally as needed for hemorrhoids or itching.   ibuprofen (ADVIL) 600 MG tablet Take 1 tablet (600 mg total) by mouth every 6 (six) hours as needed for mild pain.   omeprazole (PRILOSEC) 20 MG capsule Take 20 mg by mouth daily.   OVER THE COUNTER MEDICATION Take by mouth daily. MEGASPOREBIOTIC   psyllium (METAMUCIL) 58.6 % packet Take 1 packet by mouth daily.   RESTASIS 0.05 % ophthalmic emulsion Place 1 drop into both eyes as needed.   tamoxifen (NOLVADEX) 20 MG tablet Take 1 tablet (20 mg total) by mouth daily. (Patient taking differently: Take 20 mg by mouth at bedtime.)   triamcinolone (KENALOG) 0.1 % as needed.   valACYclovir (VALTREX) 1000 MG tablet Take 1 tablet (1,000 mg total) by mouth 2 (two) times daily. Take for 1 day as directed above as needed. (Patient taking differently: Take 1,000 mg by mouth 2 (two) times daily as needed. Take for 1 day as directed above as needed.)    valsartan-hydrochlorothiazide (DIOVAN-HCT) 160-12.5 MG tablet Take 1 tablet by mouth daily.   venlafaxine XR (EFFEXOR-XR) 75 MG 24 hr capsule TAKE 1 CAPSULE(75 MG) BY MOUTH DAILY WITH BREAKFAST   VITAMIN D PO Take 1,000 Int'l Units by mouth.   No facility-administered encounter medications on file as of 05/13/2022.    ALLERGIES:  Allergies  Allergen Reactions   Bacitracin Swelling   Hydrocodone-Acetaminophen Other (See Comments)    "Nervous energy cannot sleep"   Other     Mold causes seasonal allergies and cough and throat clearing"    Penicillins Itching   Adhesive [Tape] Other (See Comments)    SKIN IRRITATION   Augmentin [Amoxicillin-Pot Clavulanate] Itching   Penicillamine Rash     FAMILY HISTORY:  Family History  Problem Relation Age of Onset   Hypertension Mother    Heart disease Mother    Lung disease Mother    Hypertension Father    Cancer Father        colon   Heart disease Father    Lung cancer Father    Anesthesia problems Father        hard to wake up post-op   Hypertension Brother    Hyperlipidemia Brother    Diabetes Maternal Grandfather    Breast cancer Paternal Grandmother        Age 32's   Ovarian cancer Neg Hx    Endometrial cancer Neg Hx    Pancreatic cancer Neg Hx    Prostate cancer Neg Hx      SOCIAL HISTORY:  Social Connections: Not on file    REVIEW OF SYSTEMS:  + Swollen lymph nodes, bruising/bleeding easily Denies appetite changes, fevers, chills, fatigue, unexplained weight changes. Denies hearing loss, neck lumps or masses, mouth sores, ringing in ears or voice changes. Denies cough or wheezing.  Denies shortness of breath. Denies chest pain or palpitations. Denies leg swelling. Denies abdominal distention, pain, blood in stools, constipation, diarrhea, nausea, vomiting, or early satiety. Denies pain with intercourse, dysuria, frequency, hematuria or incontinence. Denies hot flashes, pelvic pain, vaginal bleeding or vaginal  discharge.   Denies joint pain, back pain or muscle pain/cramps. Denies itching, rash, or wounds. Denies dizziness, headaches, numbness or seizures. Denies anxiety, depression, confusion, or decreased concentration.  Physical Exam:  Vital Signs for this encounter:  Blood pressure 124/78, pulse 72, temperature 98.4 F (36.9 C), temperature source Oral,  resp. rate 16, weight 162 lb 3 oz (73.6 kg), last menstrual period 07/28/1996, SpO2 99 %. Body mass index is 27.84 kg/m. General: Alert, oriented, no acute distress.  HEENT: Normocephalic, atraumatic. Sclera anicteric.  Chest: Clear to auscultation bilaterally. No wheezes, rhonchi, or rales. Cardiovascular: Regular rate and rhythm, no murmurs, rubs, or gallops.  Abdomen: Normoactive bowel sounds. Soft, nondistended, nontender to palpation. No masses or hepatosplenomegaly appreciated. No palpable fluid wave.  Extremities: Grossly normal range of motion. Warm, well perfused. No edema bilaterally.  Skin: No rashes or lesions.  Lymphatics: No cervical, supraclavicular adenopathy.  GU: Deferred.  LABORATORY AND RADIOLOGIC DATA:  Outside medical records were reviewed to synthesize the above history, along with the history and physical obtained during the visit.   Lab Results  Component Value Date   WBC 4.8 04/21/2022   HGB 13.3 04/21/2022   HCT 39.0 04/21/2022   PLT 303 04/21/2022   GLUCOSE 92 04/21/2022   CHOL 193 12/12/2011   TRIG 70 12/12/2011   HDL 61 12/12/2011   LDLCALC 118 (H) 12/12/2011   ALT 18 07/08/2021   AST 19 07/08/2021   NA 136 04/21/2022   K 3.9 04/21/2022   CL 104 04/21/2022   CREATININE 0.50 04/21/2022   BUN 15 04/21/2022   CO2 23 07/08/2021   HGBA1C 5.2 12/12/2011

## 2022-05-13 NOTE — Patient Instructions (Signed)
Preparing for your Surgery  Plan for surgery on May 21, 2022 with Dr. Jeral Pinch at [facility]. You will be scheduled for dilation and curettage of the uterus with hysteroscopy and myosure (dilating the cervix and taking a sample from the lining of the uterus with assistance of a camera).   Pre-operative Testing -You will receive a phone call from presurgical testing at [facility] to discuss surgery instructions and arrange for lab work if needed.  -Bring your insurance card, copy of an advanced directive if applicable, medication list.  -You should not be taking blood thinners or aspirin at least ten days prior to surgery unless instructed by your surgeon.  -Do not take supplements such as fish oil (omega 3), red yeast rice, turmeric before your surgery. You want to avoid medications with aspirin in them including headache powders such as BC or Goody's), Excedrin migraine.  Day Before Surgery at [place] will be advised you can have clear liquids up until 3 hours before your surgery.    Your role in recovery Your role is to become active as soon as directed by your doctor, while still giving yourself time to heal.  Rest when you feel tired. You will be asked to do the following in order to speed your recovery:  - Cough and breathe deeply. This helps to clear and expand your lungs and can prevent pneumonia after surgery.  - [place]. Do mild physical activity. Walking or moving your legs help your circulation and body functions return to normal. Do not try to get up or walk alone the first time after surgery.   -If you develop swelling on one leg or the other, pain in the back of your leg, redness/warmth in one of your legs, please call the office or go to the Emergency Room to have a doppler to rule out a blood clot. For shortness of breath, chest pain-seek care in the Emergency Room as soon as possible. - Actively  manage your pain. Managing your pain lets you move in comfort. We will ask you to rate your pain on a scale of zero to 10. It is your responsibility to tell your doctor or nurse where and how much you hurt so your pain can be treated.  Special Considerations -Your final pathology results from surgery should be available around one week after surgery and the results will be relayed to you when available.  -FMLA forms can be faxed to [phone] and please allow 5-7 business days for completion.  Pain Management After Surgery -Make sure that you have Tylenol and Ibuprofen at home IF [place] to use on a regular basis after surgery for pain control. We recommend alternating the medications every hour to six hours since they work differently and are processed in the body differently for pain relief.  -Review the attached handout on narcotic use and their risks and side effects.   Bowel Regimen - It is important to prevent constipation and drink adequate amounts of liquids. You can take a laxative if needed such as Sennakot-S or Miralax.  Risks of Surgery Risks of surgery are low but include bleeding, infection, damage to surrounding structures, re-operation, blood clots, and very rarely death.  AFTER SURGERY INSTRUCTIONS  Return to work:  1-2 days if applicable  Activity: 1. Be up and out of the bed during the day.  Take a nap if needed.  You may  walk up steps but be careful and use the hand rail.  Stair climbing will tire you more than you think, you may need to stop part way and rest.   2. No lifting or straining for 2 weeks over 10 pounds. No pushing, pulling, straining for 2 weeks.  3. No driving for minimum 24 hours after surgery.  Do not drive if you are taking narcotic pain medicine and make sure that your reaction time has returned.   4. You can shower as soon as the next day after surgery. Shower daily. No tub baths or submerging your body in water  until cleared by your surgeon. If you have the soap that was given to you by pre-surgical testing that was used before surgery, you do not need to use it afterwards because this can irritate your incisions.   5. No sexual activity and nothing in the vagina for 2 weeks minimum.  6. You may experience vaginal spotting and discharge after surgery.  The spotting is normal but if you experience heavy bleeding, call our office.  7. Take Tylenol or ibuprofen first for pain if you are able to take these medications. Monitor your Tylenol intake to a max of 4,000 mg in a 24 hour period. You can alternate these medications after surgery.  Diet: 1. Low sodium Heart Healthy Diet is recommended but you are cleared to resume your normal (before surgery) diet after your procedure.  2. It is safe to use a laxative, such as Miralax or Colace, if you have difficulty moving your bowels.   Wound Care: 1. Keep clean and dry.  Shower daily.  Reasons to call the Doctor: Fever - Oral temperature greater than 100.4 degrees Fahrenheit Foul-smelling vaginal discharge Difficulty urinating Nausea and vomiting Increased pain at the site of the incision that is unrelieved with pain medicine. Difficulty breathing with or without chest pain New calf pain especially if only on one side Sudden, continuing increased vaginal bleeding with or without clots.   Contacts: For questions or concerns you should contact:  Dr. Jeral Pinch at [phone]  Joylene John, NP at [phone]  After Hours: call [phone] and have the GYN Oncologist paged/contacted (after 5 pm or on the weekends).  Messages sent via mychart are for non-urgent matters and are not responded to after hours so for urgent needs, please call the after hours number.

## 2022-05-14 ENCOUNTER — Telehealth: Payer: Self-pay | Admitting: *Deleted

## 2022-05-14 NOTE — Pre-Procedure Instructions (Signed)
Patient contacted for pre-op phone call. Patient stated that she wants to have a different type of procedure and opted not to do pre-op phone call. Patient stated she is going to speak with Melissa the PA about scheduling. RN advised to call PA for further instructions.

## 2022-05-14 NOTE — Progress Notes (Signed)
Patient here for consultation with Dr. Berline Lopes and for a pre-operative discussion prior to her scheduled surgery on May 21, 2022. She is scheduled for dilation and curettage of the uterus with hysteroscopy and myosure. The surgery was discussed in detail.  See after visit summary for additional details.    Discussed post-op pain management in detail including the aspects of the enhanced recovery pathway with use of tylenol and ibuprofen if able to take these medications.     Discussed measures to take at home to prevent DVT including frequent mobility.  Reportable signs and symptoms of DVT discussed. Post-operative instructions discussed and expectations for after surgery. Incisional care discussed as well including reportable signs and symptoms including erythema, drainage, wound separation.     5 minutes spent with the patient.  Verbalizing understanding of material discussed. No needs or concerns voiced at the end of the visit. Advised patient to call for any needs.    This appointment is included in the global surgical bundle as pre-operative teaching and has no charge.

## 2022-05-14 NOTE — Telephone Encounter (Signed)
Patient called and stated "after discussing things with my family and me really thinking about everything I want to cancel the [facility] and proceed with the total hysterectomy. I would like to postpone the surgery to the beginning of January." Explained that the message will be given to [facility] APP and the office would be calling her back

## 2022-05-14 NOTE — Patient Instructions (Signed)
Preparing for your Surgery   Plan for surgery on May 21, 2022 with Dr. Jeral Pinch at [facility]. You will be scheduled for dilation and curettage of the uterus with hysteroscopy and myosure (dilating the cervix and taking a sample from the lining of the uterus with assistance of a camera).    Pre-operative Testing -You will receive a phone call from presurgical testing at [facility] to discuss surgery instructions and arrange for lab work if needed.   -Bring your insurance card, copy of an advanced directive if applicable, medication list.   -You should not be taking blood thinners or aspirin at least ten days prior to surgery unless instructed by your surgeon.   -Do not take supplements such as fish oil (omega 3), red yeast rice, turmeric before your surgery. You want to avoid medications with aspirin in them including headache powders such as BC or Goody's), Excedrin migraine.   Day Before Surgery at [place] will be advised you can have clear liquids up until 3 hours before your surgery.     Your role in recovery Your role is to become active as soon as directed by your doctor, while still giving yourself time to heal.  Rest when you feel tired. You will be asked to do the following in order to speed your recovery:   - Cough and breathe deeply. This helps to clear and expand your lungs and can prevent pneumonia after surgery.  - [place]. Do mild physical activity. Walking or moving your legs help your circulation and body functions return to normal. Do not try to get up or walk alone the first time after surgery.   -If you develop swelling on one leg or the other, pain in the back of your leg, redness/warmth in one of your legs, please call the office or go to the Emergency Room to have a doppler to rule out a blood clot. For shortness of breath, chest pain-seek care in the Emergency Room as soon as possible. -  Actively manage your pain. Managing your pain lets you move in comfort. We will ask you to rate your pain on a scale of zero to 10. It is your responsibility to tell your doctor or nurse where and how much you hurt so your pain can be treated.   Special Considerations -Your final pathology results from surgery should be available around one week after surgery and the results will be relayed to you when available.   -FMLA forms can be faxed to [phone] and please allow 5-7 business days for completion.   Pain Management After Surgery -Make sure that you have Tylenol and Ibuprofen at home IF [place] to use on a regular basis after surgery for pain control. We recommend alternating the medications every hour to six hours since they work differently and are processed in the body differently for pain relief.   -Review the attached handout on narcotic use and their risks and side effects.    Bowel Regimen - It is important to prevent constipation and drink adequate amounts of liquids. You can take a laxative if needed such as Sennakot-S or Miralax.   Risks of Surgery Risks of surgery are low but include bleeding, infection, damage to surrounding structures, re-operation, blood clots, and very rarely death.   AFTER SURGERY INSTRUCTIONS   Return to work:  1-2 days if applicable   Activity: 1. Be up  and out of the bed during the day.  Take a nap if needed.  You may walk up steps but be careful and use the hand rail.  Stair climbing will tire you more than you think, you may need to stop part way and rest.    2. No lifting or straining for 2 weeks over 10 pounds. No pushing, pulling, straining for 2 weeks.   3. No driving for minimum 24 hours after surgery.  Do not drive if you are taking narcotic pain medicine and make sure that your reaction time has returned.    4. You can shower as soon as the next day after surgery. Shower daily. No tub baths or submerging  your body in water until cleared by your surgeon. If you have the soap that was given to you by pre-surgical testing that was used before surgery, you do not need to use it afterwards because this can irritate your incisions.    5. No sexual activity and nothing in the vagina for 2 weeks minimum.   6. You may experience vaginal spotting and discharge after surgery.  The spotting is normal but if you experience heavy bleeding, call our office.   7. Take Tylenol or ibuprofen first for pain if you are able to take these medications. Monitor your Tylenol intake to a max of 4,000 mg in a 24 hour period. You can alternate these medications after surgery.   Diet: 1. Low sodium Heart Healthy Diet is recommended but you are cleared to resume your normal (before surgery) diet after your procedure.   2. It is safe to use a laxative, such as Miralax or Colace, if you have difficulty moving your bowels.    Wound Care: 1. Keep clean and dry.  Shower daily.   Reasons to call the Doctor: Fever - Oral temperature greater than 100.4 degrees Fahrenheit Foul-smelling vaginal discharge Difficulty urinating Nausea and vomiting Increased pain at the site of the incision that is unrelieved with pain medicine. Difficulty breathing with or without chest pain New calf pain especially if only on one side Sudden, continuing increased vaginal bleeding with or without clots.   Contacts: For questions or concerns you should contact:   Dr. Jeral Pinch at [phone]   Joylene John, NP at [phone]   After Hours: call [phone] and have the GYN Oncologist paged/contacted (after 5 pm or on the weekends).   Messages sent via mychart are for non-urgent matters and are not responded to after hours so for urgent needs, please call the after hours number.

## 2022-05-16 ENCOUNTER — Encounter: Payer: Self-pay | Admitting: Gynecologic Oncology

## 2022-05-16 NOTE — Progress Notes (Signed)
Pt spoke with office and wants to cancel her d/c for 05/21/2022 and reschudel for a hysterectomy in Jan 2024.

## 2022-05-19 ENCOUNTER — Encounter: Payer: Self-pay | Admitting: Family Medicine

## 2022-05-20 ENCOUNTER — Encounter: Payer: Self-pay | Admitting: Gynecologic Oncology

## 2022-05-21 DIAGNOSIS — R9389 Abnormal findings on diagnostic imaging of other specified body structures: Secondary | ICD-10-CM

## 2022-05-23 ENCOUNTER — Telehealth: Payer: Self-pay | Admitting: Gynecologic Oncology

## 2022-05-23 DIAGNOSIS — I639 Cerebral infarction, unspecified: Secondary | ICD-10-CM | POA: Diagnosis not present

## 2022-05-23 DIAGNOSIS — I720 Aneurysm of carotid artery: Secondary | ICD-10-CM | POA: Diagnosis not present

## 2022-05-23 NOTE — Telephone Encounter (Signed)
Called patient to follow up on last mychart message sent. Patient discussed her wishes moving forward. Our office will find several potential OR dates and sent her a message in [place]. She will then discuss the dates with her family and get back with our office. No needs or concerns voiced at the end of the call.

## 2022-05-26 ENCOUNTER — Other Ambulatory Visit: Payer: Self-pay

## 2022-05-26 ENCOUNTER — Other Ambulatory Visit: Payer: Self-pay | Admitting: *Deleted

## 2022-05-26 ENCOUNTER — Encounter: Payer: Self-pay | Admitting: Family Medicine

## 2022-05-26 DIAGNOSIS — I671 Cerebral aneurysm, nonruptured: Secondary | ICD-10-CM

## 2022-05-26 MED ORDER — TAMOXIFEN CITRATE 20 MG PO TABS: 20.0000 mg | ORAL_TABLET | Freq: Every day | ORAL | 0 refills | Status: DC

## 2022-05-28 ENCOUNTER — Encounter: Payer: Self-pay | Admitting: Gynecologic Oncology

## 2022-05-30 ENCOUNTER — Other Ambulatory Visit: Payer: Medicare PPO

## 2022-06-02 DIAGNOSIS — Z6827 Body mass index (BMI) 27.0-27.9, adult: Secondary | ICD-10-CM | POA: Diagnosis not present

## 2022-06-02 DIAGNOSIS — I671 Cerebral aneurysm, nonruptured: Secondary | ICD-10-CM | POA: Diagnosis not present

## 2022-06-03 ENCOUNTER — Encounter (HOSPITAL_BASED_OUTPATIENT_CLINIC_OR_DEPARTMENT_OTHER): Payer: Self-pay | Admitting: Gynecologic Oncology

## 2022-06-04 ENCOUNTER — Other Ambulatory Visit: Payer: Self-pay | Admitting: *Deleted

## 2022-06-04 DIAGNOSIS — Z170 Estrogen receptor positive status [ER+]: Secondary | ICD-10-CM

## 2022-06-04 NOTE — Progress Notes (Signed)
Patient Care Team: Josetta Huddle, MD as PCP - General (Internal Medicine) Rolm Bookbinder, MD as Consulting Physician (General Surgery) Magrinat, Virgie Dad, MD (Inactive) as Consulting Physician (Oncology) Delice Bison, Charlestine Massed, NP as Nurse Practitioner (Hematology and Oncology) Yisroel Ramming, Everardo All, MD as Consulting Physician (Obstetrics and Gynecology)  DIAGNOSIS: No diagnosis found.  SUMMARY OF ONCOLOGIC HISTORY: Oncology History  Malignant neoplasm of upper-outer quadrant of right breast in female, estrogen receptor positive ([place])  03/26/2016 Surgery   right breast upper outer quadrant lumpectomy 03/26/2016 for a pT1c pN1, stage IIA invasive ductal carcinoma, grade 1, estrogen and progesterone receptor positive, HER-2 nonamplified, with an MIB-1 of 12%              (a) mammaprint from this tumor was read as "low risk".          02/2016 Initial Diagnosis   Malignant neoplasm of upper-outer quadrant of right breast in female, estrogen receptor positive ([place])   03/26/2016 Surgery   a second right breast cancer, invasive lobular, grade 1, measuring 0.6 cm, focally involved the final right medial margin from the 03/26/2016 procedure; this tumor also was estrogen and progesterone receptor positive, HER-2 negative, with an MIB-1 of 3%             (a) medial margin cleared with additional surgery 05/20/2016.   03/2016 -  Anti-estrogen oral therapy   tamoxifen started 04/18/2016--to be continued a minimum of 5 years, likely followed by anastrozole x 2 years   08/19/2016 - 09/29/2016 Radiation Therapy   radiation therapy 08/19/16-09/29/16 with capecitabine sensitization Site/dose:   1) Right breast/ 50Gy in 25 fractions                         2) Right breast boost/ 10 Gy in 5 fractions                         3) Left breast 50.4 Gy in 28 fractions   Malignant neoplasm of overlapping sites of left breast in female, estrogen receptor positive ([place])  04/09/2016 Initial Biopsy   two  biopsies from the left breast 04/09/2016 and 04/11/2016, 4.6 cm apart, showed             (a) centrally, an invasive ductal carcinoma, grade 1 or 2, with no prognostic panel available             (b) in the upper outer quadrant, invasive ductal carcinoma, grade 2, estrogen receptor 10% positive, progesterone receptor and HER-2 negative, with an MIB-1 of 2%             (c) left axillary lymph node biopsy 04/14/2016 showed invasive ductal carcinoma, estrogen and progesterone receptor negative             (d) mammaprint from (2)(c) was also "low risk"   03/2016 Initial Diagnosis   Malignant neoplasm of overlapping sites of left breast in female, estrogen receptor positive ([place])   03/2016 -  Anti-estrogen oral therapy   tamoxifen started 04/18/2016--to be continued a minimum of 5 years, likely followed by anastrozole x 2 years   05/20/2016 Surgery   left lumpectomy 05/20/2016 showed a  pT1c pN1 stage IIA  invasive ductal carcinoma, grade 2, estrogen receptor positive, progesterone receptor negative, HER-2 nonamplified, with an MIB-1 of 2%; margins were positive    07/01/2016 Surgery   status post left sided nipple sparing mastectomy with immediate expander placement 07/01/2016  showing multiple foci of residual grade 2 invasive ductal carcinoma, the largest measuring 0.7 cm, with evidence of lymphovascular invasion and multiple close but negative margins   08/19/2016 - 09/29/2016 Radiation Therapy   radiation therapy 08/19/16-09/29/16 with capecitabine sensitization Site/dose:   1) Right breast/ 50Gy in 25 fractions                         2) Right breast boost/ 10 Gy in 5 fractions                         3) Left breast 50.4 Gy in 28 fractions     CHIEF COMPLIANT:   INTERVAL HISTORY: Sabrina Tapia is a   ALLERGIES:  is allergic to bacitracin, hydrocodone, other, penicillins, adhesive [tape], augmentin [amoxicillin-pot clavulanate], and penicillamine.  MEDICATIONS:  Current Outpatient  Medications  Medication Sig Dispense Refill   tamoxifen (NOLVADEX) 20 MG tablet Take 1 tablet (20 mg total) by mouth daily. 90 tablet 0   albuterol (PROAIR HFA) 108 (90 Base) MCG/ACT inhaler Inhale 2 puffs into the lungs every 4 (four) hours as needed for wheezing or shortness of breath. 1 Inhaler 1   ALPRAZolam (XANAX) 0.25 MG tablet as needed. Prn for travel     amLODipine (NORVASC) 2.5 MG tablet 1 tablet     atorvastatin (LIPITOR) 10 MG tablet at bedtime.     azelastine (ASTELIN) 0.1 % nasal spray 1 spray daily.     cetirizine (ZYRTEC) 10 MG tablet Take 10 mg by mouth daily.     FLOVENT HFA 110 MCG/ACT inhaler INHALE 1 PUFF BY MOUTH INTO THE LUNGS TWICE DAILY (Patient taking differently: 2 puffs as needed.) 12 g 0   fluticasone (FLONASE) 50 MCG/ACT nasal spray Place 1 spray into both nostrils daily.     hydrocortisone (ANUSOL-HC) 2.5 % rectal cream Place 1 application rectally as needed for hemorrhoids or itching.     ibuprofen (ADVIL) 600 MG tablet Take 1 tablet (600 mg total) by mouth every 6 (six) hours as needed for mild pain. 30 tablet 0   omeprazole (PRILOSEC) 20 MG capsule Take 20 mg by mouth daily.     OVER THE COUNTER MEDICATION Take by mouth daily. MEGASPOREBIOTIC     psyllium (METAMUCIL) 58.6 % packet Take 1 packet by mouth daily.     RESTASIS 0.05 % ophthalmic emulsion Place 1 drop into both eyes as needed.     triamcinolone (KENALOG) 0.1 % as needed.     valACYclovir (VALTREX) 1000 MG tablet Take 1 tablet (1,000 mg total) by mouth 2 (two) times daily. Take for 1 day as directed above as needed. (Patient taking differently: Take 1,000 mg by mouth 2 (two) times daily as needed. Take for 1 day as directed above as needed.) 30 tablet 1   valsartan-hydrochlorothiazide (DIOVAN-HCT) 160-12.5 MG tablet Take 1 tablet by mouth daily.     venlafaxine XR (EFFEXOR-XR) 75 MG 24 hr capsule TAKE 1 CAPSULE(75 MG) BY MOUTH DAILY WITH BREAKFAST 90 capsule 4   VITAMIN D PO Take 1,000 Int'l Units by  mouth.     No current facility-administered medications for this visit.    PHYSICAL EXAMINATION: ECOG PERFORMANCE STATUS: {CHL ONC ECOG PS:[id number]}  There were no vitals filed for this visit. There were no vitals filed for this visit.  BREAST:*** No palpable masses or nodules in either right or left breasts. No palpable axillary supraclavicular or infraclavicular adenopathy no   breast tenderness or nipple discharge. (exam performed in the presence of a chaperone)  LABORATORY DATA:  I have reviewed the data as listed    Latest Ref Rng & Units 04/21/2022    7:58 AM 07/08/2021    9:37 AM 06/10/2021    8:11 AM  CMP  Glucose 70 - 99 mg/dL 92  87  88   BUN 8 - 23 mg/dL _0 Creatinine 0.44 - 1.00 mg/dL 0.50  0.70  0.70   Sodium 135 - 145 mmol/L 136  136  137   Potassium 3.5 - 5.1 mmol/L 3.9  4.1  4.2   Chloride 98 - 111 mmol/L 104  105  106   CO2 22 - 32 mmol/L  23  24   Calcium 8.9 - 10.3 mg/dL  8.7  8.6   Total Protein 6.5 - 8.1 g/dL  6.6  6.2   Total Bilirubin 0.3 - 1.2 mg/dL  0.3  0.3   Alkaline Phos 38 - 126 U/L  51  52   AST 15 - 41 U/L  19  22   ALT 0 - 44 U/L  18  20     Lab Results  Component Value Date   WBC 4.8 04/21/2022   HGB 13.3 04/21/2022   HCT 39.0 04/21/2022   MCV 92.4 04/21/2022   PLT 303 04/21/2022   NEUTROABS 3.8 07/08/2021    ASSESSMENT & PLAN:  No problem-specific Assessment & Plan notes found for this encounter.    No orders of the defined types were placed in this encounter.  The patient has a good understanding of the overall plan. she agrees with it. she will call with any problems that may develop before the next visit here. Total time spent: 30 mins including face to face time and time spent for planning, charting and co-ordination of care   Suzzette Righter, [place] 06/04/22    I Gardiner Coins am scribing for Dr. Lindi Adie  ***

## 2022-06-05 ENCOUNTER — Ambulatory Visit (HOSPITAL_BASED_OUTPATIENT_CLINIC_OR_DEPARTMENT_OTHER): Admission: EM | Admit: 2022-06-05 | Payer: Medicare PPO | Source: Home / Self Care | Admitting: Gynecologic Oncology

## 2022-06-05 ENCOUNTER — Inpatient Hospital Stay: Payer: Medicare PPO | Attending: Gynecologic Oncology

## 2022-06-05 ENCOUNTER — Inpatient Hospital Stay (HOSPITAL_BASED_OUTPATIENT_CLINIC_OR_DEPARTMENT_OTHER): Payer: Medicare PPO | Admitting: Hematology and Oncology

## 2022-06-05 VITALS — BP 147/81 | HR 89 | Temp 97.8°F | Resp 18 | Ht 64.0 in | Wt 158.9 lb

## 2022-06-05 DIAGNOSIS — Z79899 Other long term (current) drug therapy: Secondary | ICD-10-CM | POA: Diagnosis not present

## 2022-06-05 DIAGNOSIS — Z7951 Long term (current) use of inhaled steroids: Secondary | ICD-10-CM | POA: Insufficient documentation

## 2022-06-05 DIAGNOSIS — Z79810 Long term (current) use of selective estrogen receptor modulators (SERMs): Secondary | ICD-10-CM | POA: Insufficient documentation

## 2022-06-05 DIAGNOSIS — C50411 Malignant neoplasm of upper-outer quadrant of right female breast: Secondary | ICD-10-CM | POA: Diagnosis not present

## 2022-06-05 DIAGNOSIS — Z170 Estrogen receptor positive status [ER+]: Secondary | ICD-10-CM

## 2022-06-05 DIAGNOSIS — Z79624 Long term (current) use of inhibitors of nucleotide synthesis: Secondary | ICD-10-CM | POA: Diagnosis not present

## 2022-06-05 DIAGNOSIS — R9389 Abnormal findings on diagnostic imaging of other specified body structures: Secondary | ICD-10-CM

## 2022-06-05 DIAGNOSIS — Z923 Personal history of irradiation: Secondary | ICD-10-CM | POA: Diagnosis not present

## 2022-06-05 HISTORY — DX: Other allergic rhinitis: J30.89

## 2022-06-05 HISTORY — DX: Other specified postprocedural states: Z85.828

## 2022-06-05 HISTORY — DX: Personal history of other malignant neoplasm of skin: Z98.890

## 2022-06-05 HISTORY — DX: Obstructive sleep apnea (adult) (pediatric): G47.33

## 2022-06-05 LAB — CMP (CANCER CENTER ONLY)
ALT: 19 U/L (ref 0–44)
AST: 23 U/L (ref 15–41)
Albumin: 4.2 g/dL (ref 3.5–5.0)
Alkaline Phosphatase: 57 U/L (ref 38–126)
Anion gap: 6 (ref 5–15)
BUN: 15 mg/dL (ref 8–23)
CO2: 27 mmol/L (ref 22–32)
Calcium: 9.2 mg/dL (ref 8.9–10.3)
Chloride: 102 mmol/L (ref 98–111)
Creatinine: 0.77 mg/dL (ref 0.44–1.00)
GFR, Estimated: >60 mL/min (ref >60)
Glucose, Bld: 91 mg/dL (ref 70–99)
Potassium: 4.2 mmol/L (ref 3.5–5.1)
Sodium: 135 mmol/L (ref 135–145)
Total Bilirubin: 0.4 mg/dL (ref 0.3–1.2)
Total Protein: 6.8 g/dL (ref 6.5–8.1)

## 2022-06-05 LAB — CBC WITH DIFFERENTIAL (CANCER CENTER ONLY)
Abs Immature Granulocytes: 0.01 10*3/uL (ref 0.00–0.07)
Basophils Absolute: 0.1 10*3/uL (ref 0.0–0.1)
Basophils Relative: 1 %
Eosinophils Absolute: 0.2 10*3/uL (ref 0.0–0.5)
Eosinophils Relative: 4 %
HCT: 39.8 % (ref 36.0–46.0)
Hemoglobin: 13.5 g/dL (ref 12.0–15.0)
Immature Granulocytes: 0 %
Lymphocytes Relative: 21 %
Lymphs Abs: 1.1 10*3/uL (ref 0.7–4.0)
MCH: 30.6 pg (ref 26.0–34.0)
MCHC: 33.9 g/dL (ref 30.0–36.0)
MCV: 90.2 fL (ref 80.0–100.0)
Monocytes Absolute: 0.6 10*3/uL (ref 0.1–1.0)
Monocytes Relative: 11 %
Neutro Abs: 3.5 10*3/uL (ref 1.7–7.7)
Neutrophils Relative %: 63 %
Platelet Count: 336 10*3/uL (ref 150–400)
RBC: 4.41 MIL/uL (ref 3.87–5.11)
RDW: 13.6 % (ref 11.5–15.5)
WBC Count: 5.5 10*3/uL (ref 4.0–10.5)
nRBC: 0 % (ref 0.0–0.2)

## 2022-06-05 SURGERY — DILATATION & CURETTAGE/HYSTEROSCOPY WITH MYOSURE
Anesthesia: Choice

## 2022-06-05 MED ORDER — TAMOXIFEN CITRATE 20 MG PO TABS: 20.0000 mg | ORAL_TABLET | Freq: Every day | ORAL | 3 refills | Status: DC

## 2022-06-05 MED ORDER — VENLAFAXINE HCL ER 75 MG PO CP24: ORAL_CAPSULE | ORAL | 4 refills | Status: DC

## 2022-06-05 NOTE — Assessment & Plan Note (Addendum)
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 care with me 03/26/2016: T1CN1 stage IIa right breast IDC grade 1 ER/PR positive HER2 negative Ki-67 12%, MammaPrint low risk 03/26/2016: Right lumpectomy 1.4 cm grade 1 IDC with DCIS second right breast cancer: Grade 1 ILC 0.6 cm ER/PR positive HER2 negative Ki-67 3% 04/09/2016: Left breast: Grade 1 through 2 IDC, ER 10%, PR 0%, HER2 negative, Ki-67 2% 04/14/2016: Left axillary lymph node biopsy: Positive for IDC ER/PR negative, MammaPrint low risk 05/20/2016: Left lumpectomy: T1CN1 stage IIa grade 2 IDC ER positive PR negative HER2 negative Ki-67 2% 07/11/2016: Left mastectomy: Multiple foci of residual grade 2 IDC largest 0.7 cm with lymphovascular invasion, reconstruction 07/30/2016 - 09/29/2016: Radiation with capecitabine 04/18/2016: Tamoxifen started discontinued April 2024 endometrial thickening, started anastrozole 11/18/2018 stopped June 2020, resumed tamoxifen  Tamoxifen toxicities: Endometrial hypertrophy: Patient is going to undergo hysterectomy next month.  I instructed her to stop tamoxifen for 1 week before and until she is more mobile after. Occasional hot flashes which seem to be getting better with the Effexor.  Breast cancer surveillance: Breast examination 06/05/2022: Benign Right breast mammogram:  She is an avid golfer and a member of the [employer].  Return to clinic in 1 year for follow-up

## 2022-06-06 ENCOUNTER — Encounter: Payer: Medicare PPO | Admitting: Gynecologic Oncology

## 2022-06-10 ENCOUNTER — Ambulatory Visit: Payer: Medicare PPO | Admitting: Hematology and Oncology

## 2022-06-10 ENCOUNTER — Other Ambulatory Visit: Payer: Medicare PPO

## 2022-06-13 ENCOUNTER — Telehealth: Payer: Self-pay

## 2022-06-13 NOTE — Telephone Encounter (Signed)
I contacted [name] , per Joylene John NP, to  let her know her surgery is scheduled for November 30. Since the plan has changed from the original plan for a d and c, she will need to come in to see Berline Lopes to have a preop discussion with her and me.   Follow up and pre-op is scheduled for 11/28. Pt agreed to surgery date and date /time of pre-op

## 2022-06-16 NOTE — Progress Notes (Signed)
Please place orders for PAT appointment scheduled 06/17/22.

## 2022-06-16 NOTE — Progress Notes (Signed)
Need consent signed DOS as patient has not had pre op with surgeon yet.  COVID Vaccine Completed: yes  Date of COVID positive in last 90 days: no  PCP - Okey Dupre, MD Cardiologist - n/a  Chest x-ray - n/a EKG - 04/21/22 Epic Stress Test - n/a ECHO - n/a Cardiac Cath - n/a Pacemaker/ICD device last checked: n/a Spinal Cord Stimulator: n/a  Bowel Prep - light diet day before surgery  Sleep Study - yes CPAP - no, uses appliance  Fasting Blood Sugar - n/a Checks Blood Sugar _____ times a day  Last dose of GLP1 agonist-  N/A GLP1 instructions:  N/A   Last dose of SGLT-2 inhibitors-  N/A SGLT-2 instructions: N/A   Blood Thinner Instructions: n/a Aspirin Instructions: Last Dose:  Activity level: Can go up a flight of stairs and perform activities of daily living without stopping and without symptoms of chest pain or shortness of breath.   Anesthesia review: HTN, CAD, OSA, emphysema   Patient denies shortness of breath, fever, cough and chest pain at PAT appointment  Patient verbalized understanding of instructions that were given to them at the PAT appointment. Patient was also instructed that they will need to review over the PAT instructions again at home before surgery.

## 2022-06-16 NOTE — Patient Instructions (Signed)
SURGICAL WAITING ROOM VISITATION Patients having surgery or a procedure may have no more than 2 support people in the waiting area - these visitors may rotate.   Children under the age of 17 must have an adult with them who is not the patient. If the patient needs to stay at the hospital during part of their recovery, the visitor guidelines for inpatient rooms apply. Pre-op nurse will coordinate an appropriate time for 1 support person to accompany patient in pre-op.  This support person may not rotate.    Please refer to the [facility] website for the visitor guidelines for Inpatients (after your surgery is over and you are in a regular room).     Your procedure is scheduled on: 06/26/22   Report to [facility] Main Entrance    Report to admitting at 8:30 AM   Call this number if you have problems the morning of surgery [phone]   Do not eat food :After Midnight.   After Midnight you may have the following liquids until 7:30 AM DAY OF SURGERY  Water Non-Citrus Juices (without pulp, NO RED) Carbonated Beverages Black Coffee (NO MILK/CREAM OR CREAMERS, sugar ok)  Clear Tea (NO MILK/CREAM OR CREAMERS, sugar ok) regular and decaf                             Plain Jell-O (NO RED)                                           Fruit ices (not with fruit pulp, NO RED)                                     Popsicles (NO RED)                                                               Sports drinks like Gatorade (NO RED)          If you have questions, please contact your surgeon's office.   FOLLOW BOWEL PREP AND ANY ADDITIONAL PRE OP INSTRUCTIONS YOU RECEIVED FROM YOUR SURGEON'S OFFICE!!!     Oral Hygiene is also important to reduce your risk of infection.                                    Remember - BRUSH YOUR TEETH THE MORNING OF SURGERY WITH YOUR REGULAR TOOTHPASTE   Take these medicines the morning of surgery with A SIP OF WATER: Tylenol, Inhalers, Xanax, Amlodipine,  Omeprazole, Effexor                               You may not have any metal on your body including hair pins, jewelry, and body piercing             Do not wear make-up, lotions, powders, perfumes, or deodorant  Do not wear nail polish including gel and S&S, artificial/acrylic nails,  or any other type of covering on natural nails including finger and toenails. If you have artificial nails, gel coating, etc. that needs to be removed by a nail salon please have this removed prior to surgery or surgery may need to be canceled/ delayed if the surgeon/ anesthesia feels like they are unable to be safely monitored.   Do not shave  48 hours prior to surgery.    Do not bring valuables to the hospital. [place].  DO NOT [place]. PHARMACY WILL DISPENSE MEDICATIONS LISTED ON YOUR MEDICATION LIST TO YOU DURING YOUR ADMISSION [place]!    Patients discharged on the day of surgery will not be allowed to drive home.  Someone NEEDS to stay with you for the first 24 hours after anesthesia.   Special Instructions: Bring a copy of your healthcare power of attorney and living will documents the day of surgery if you haven't scanned them before.              Please read over the following fact sheets you were given: IF [place] [id number] Schneiders    If you received a COVID test during your pre-op visit  it is requested that you wear a mask when out in public, stay away from anyone that may not be feeling well and notify your surgeon if you develop symptoms. If you test positive for Covid or have been in contact with anyone that has tested positive in the last 10 days please notify you surgeon.    [facility] - Preparing for Surgery Before surgery, you can play an important role.  Because skin is not sterile, your skin needs to be as free of germs as possible.  You  can reduce the number of germs on your skin by washing with CHG (chlorahexidine gluconate) soap before surgery.  CHG is an antiseptic cleaner which kills germs and bonds with the skin to continue killing germs even after washing. Please DO NOT use if you have an allergy to CHG or antibacterial soaps.  If your skin becomes reddened/irritated stop using the CHG and inform your nurse when you arrive at Short Stay. Do not shave (including legs and underarms) for at least 48 hours prior to the first CHG shower.  You may shave your face/neck.  Please follow these instructions carefully:  1.  Shower with CHG Soap the night before surgery and the  morning of surgery.  2.  If you choose to wash your hair, wash your hair first as usual with your normal  shampoo.  3.  After you shampoo, rinse your hair and body thoroughly to remove the shampoo.                             4.  Use CHG as you would any other liquid soap.  You can apply chg directly to the skin and wash.  Gently with a scrungie or clean washcloth.  5.  Apply the CHG Soap to your body ONLY FROM THE NECK DOWN.   Do   not use on face/ open                           Wound or open sores. Avoid contact with  eyes, ears mouth and   genitals (private parts).                       Wash face,  Genitals (private parts) with your normal soap.             6.  Wash thoroughly, paying special attention to the area where your    surgery  will be performed.  7.  Thoroughly rinse your body with warm water from the neck down.  8.  DO NOT shower/wash with your normal soap after using and rinsing off the CHG Soap.                9.  Pat yourself dry with a clean towel.            10.  Wear clean pajamas.            11.  Place clean sheets on your bed the night of your first shower and do not  sleep with pets. Day of Surgery : Do not apply any lotions/deodorants the morning of surgery.  Please wear clean clothes to the hospital/surgery center.  FAILURE TO FOLLOW  THESE INSTRUCTIONS MAY RESULT IN THE CANCELLATION OF YOUR SURGERY  PATIENT SIGNATURE_________________________________  NURSE SIGNATURE__________________________________  ________________________________________________________________________ WHAT IS A BLOOD TRANSFUSION? Blood Transfusion Information  A transfusion is the replacement of blood or some of its parts. Blood is made up of multiple cells which provide different functions. Red blood cells carry oxygen and are used for blood loss replacement. White blood cells fight against infection. Platelets control bleeding. Plasma helps clot blood. Other blood products are available for specialized needs, such as hemophilia or other clotting disorders. BEFORE THE TRANSFUSION  Who gives blood for transfusions?  Healthy volunteers who are fully evaluated to make sure their blood is safe. This is blood bank blood. Transfusion therapy is the safest it has ever been in the practice of medicine. Before blood is taken from a donor, a complete history is taken to make sure that person has no history of diseases nor engages in risky social behavior (examples are intravenous drug use or sexual activity with multiple partners). The donor's travel history is screened to minimize risk of transmitting infections, such as malaria. The donated blood is tested for signs of infectious diseases, such as HIV and hepatitis. The blood is then tested to be sure it is compatible with you in order to minimize the chance of a transfusion reaction. If you or a relative donates blood, this is often done in anticipation of surgery and is not appropriate for emergency situations. It takes many days to process the donated blood. RISKS AND COMPLICATIONS Although transfusion therapy is very safe and saves many lives, the main dangers of transfusion include:  Getting an infectious disease. Developing a transfusion reaction. This is an allergic reaction to something in the blood  you were given. Every precaution is taken to prevent this. The decision to have a blood transfusion has been considered carefully by your caregiver before blood is given. Blood is not given unless the benefits outweigh the risks. AFTER THE TRANSFUSION Right after receiving a blood transfusion, you will usually feel much better and more energetic. This is especially true if your red blood cells have gotten low (anemic). The transfusion raises the level of the red blood cells which carry oxygen, and this usually causes an energy increase. The nurse administering the transfusion will monitor you carefully for complications. HOME CARE  INSTRUCTIONS  No special instructions are needed after a transfusion. You may find your energy is better. Speak with your caregiver about any limitations on activity for underlying diseases you may have. SEEK MEDICAL CARE IF:  Your condition is not improving after your transfusion. You develop redness or irritation at the intravenous (IV) site. SEEK IMMEDIATE MEDICAL CARE IF:  Any of the following symptoms occur over the next 12 hours: Shaking chills. You have a temperature by mouth above 102 F (38.9 C), not controlled by medicine. Chest, back, or muscle pain. People around you feel you are not acting correctly or are confused. Shortness of breath or difficulty breathing. Dizziness and fainting. You get a rash or develop hives. You have a decrease in urine output. Your urine turns a dark color or changes to pink, red, or brown. Any of the following symptoms occur over the next 10 days: You have a temperature by mouth above 102 F (38.9 C), not controlled by medicine. Shortness of breath. Weakness after normal activity. The white part of the eye turns yellow (jaundice). You have a decrease in the amount of urine or are urinating less often. Your urine turns a dark color or changes to pink, red, or brown. Document Released: 07/11/2000 Document Revised:  10/06/2011 Document Reviewed: 02/28/2008 [facility] Patient Information 2014 [place], [place].  _______________________________________________________________________

## 2022-06-17 ENCOUNTER — Encounter: Payer: Medicare PPO | Admitting: Gynecologic Oncology

## 2022-06-17 ENCOUNTER — Encounter (HOSPITAL_COMMUNITY): Payer: Self-pay

## 2022-06-17 ENCOUNTER — Telehealth: Payer: Self-pay

## 2022-06-17 ENCOUNTER — Encounter (HOSPITAL_COMMUNITY)
Admission: RE | Admit: 2022-06-17 | Discharge: 2022-06-17 | Disposition: A | Discharge status: Home / Self Care | Payer: Medicare PPO | Source: Ambulatory Visit | Attending: Gynecologic Oncology | Admitting: Gynecologic Oncology

## 2022-06-17 VITALS — BP 126/80 | HR 73 | Temp 98.4°F | Resp 14 | Ht 64.0 in | Wt 159.0 lb

## 2022-06-17 DIAGNOSIS — Z01818 Encounter for other preprocedural examination: Secondary | ICD-10-CM | POA: Diagnosis not present

## 2022-06-17 DIAGNOSIS — R9389 Abnormal findings on diagnostic imaging of other specified body structures: Secondary | ICD-10-CM | POA: Diagnosis not present

## 2022-06-17 HISTORY — DX: Emphysema, unspecified: J43.9

## 2022-06-17 HISTORY — DX: Cerebral infarction, unspecified: I63.9

## 2022-06-17 LAB — COMPREHENSIVE METABOLIC PANEL
ALT: 20 U/L (ref 0–44)
AST: 24 U/L (ref 15–41)
Albumin: 3.8 g/dL (ref 3.5–5.0)
Alkaline Phosphatase: 55 U/L (ref 38–126)
Anion gap: 7 (ref 5–15)
BUN: 20 mg/dL (ref 8–23)
CO2: 25 mmol/L (ref 22–32)
Calcium: 9.1 mg/dL (ref 8.9–10.3)
Chloride: 100 mmol/L (ref 98–111)
Creatinine, Ser: 0.65 mg/dL (ref 0.44–1.00)
GFR, Estimated: >60 mL/min (ref >60)
Glucose, Bld: 85 mg/dL (ref 70–99)
Potassium: 4.3 mmol/L (ref 3.5–5.1)
Sodium: 132 mmol/L — ABNORMAL LOW (ref 135–145)
Total Bilirubin: 0.4 mg/dL (ref 0.3–1.2)
Total Protein: 6.8 g/dL (ref 6.5–8.1)

## 2022-06-17 LAB — CBC
HCT: 40.7 % (ref 36.0–46.0)
Hemoglobin: 13.4 g/dL (ref 12.0–15.0)
MCH: 30.5 pg (ref 26.0–34.0)
MCHC: 32.9 g/dL (ref 30.0–36.0)
MCV: 92.5 fL (ref 80.0–100.0)
Platelets: 330 10*3/uL (ref 150–400)
RBC: 4.4 MIL/uL (ref 3.87–5.11)
RDW: 13.7 % (ref 11.5–15.5)
WBC: 5.9 10*3/uL (ref 4.0–10.5)
nRBC: 0 % (ref 0.0–0.2)

## 2022-06-17 NOTE — Telephone Encounter (Signed)
I spoke to Ms.Awwad at the request of Joylene John NP. Pt had pre-admit labs drawn today and her sodium was a little low (132). Pt aware she does not need to be restricting sodium and to stay hydrated with things other than just water. Pt states she is aware her sodium tends to stay low and has started using hydrating packets.

## 2022-06-23 ENCOUNTER — Encounter: Payer: Self-pay | Admitting: Gynecologic Oncology

## 2022-06-24 ENCOUNTER — Inpatient Hospital Stay (HOSPITAL_BASED_OUTPATIENT_CLINIC_OR_DEPARTMENT_OTHER): Payer: Medicare PPO | Admitting: Gynecologic Oncology

## 2022-06-24 ENCOUNTER — Encounter: Payer: Self-pay | Admitting: Gynecologic Oncology

## 2022-06-24 VITALS — BP 147/80 | HR 70 | Temp 98.4°F | Resp 16 | Ht 63.39 in | Wt 161.6 lb

## 2022-06-24 DIAGNOSIS — R9389 Abnormal findings on diagnostic imaging of other specified body structures: Secondary | ICD-10-CM

## 2022-06-24 DIAGNOSIS — Z170 Estrogen receptor positive status [ER+]: Secondary | ICD-10-CM | POA: Diagnosis not present

## 2022-06-24 DIAGNOSIS — Z7951 Long term (current) use of inhaled steroids: Secondary | ICD-10-CM | POA: Diagnosis not present

## 2022-06-24 DIAGNOSIS — Z853 Personal history of malignant neoplasm of breast: Secondary | ICD-10-CM

## 2022-06-24 DIAGNOSIS — Z79810 Long term (current) use of selective estrogen receptor modulators (SERMs): Secondary | ICD-10-CM

## 2022-06-24 DIAGNOSIS — Z923 Personal history of irradiation: Secondary | ICD-10-CM | POA: Diagnosis not present

## 2022-06-24 DIAGNOSIS — Z79899 Other long term (current) drug therapy: Secondary | ICD-10-CM | POA: Diagnosis not present

## 2022-06-24 DIAGNOSIS — Z79624 Long term (current) use of inhibitors of nucleotide synthesis: Secondary | ICD-10-CM | POA: Diagnosis not present

## 2022-06-24 DIAGNOSIS — C50411 Malignant neoplasm of upper-outer quadrant of right female breast: Secondary | ICD-10-CM

## 2022-06-24 MED ORDER — SENNOSIDES-DOCUSATE SODIUM 8.6-50 MG PO TABS: 2.0000 | ORAL_TABLET | Freq: Every day | ORAL | 0 refills | Status: DC

## 2022-06-24 MED ORDER — TRAMADOL HCL 50 MG PO TABS: 50.0000 mg | ORAL_TABLET | Freq: Four times a day (QID) | ORAL | 0 refills | Status: DC | PRN

## 2022-06-24 NOTE — Progress Notes (Signed)
Gynecologic Oncology Return Clinic Visit  06/24/22  Reason for Visit: treatment planning  Treatment History: Oncology History  Malignant neoplasm of upper-outer quadrant of right breast in female, estrogen receptor positive ([place])  03/26/2016 Surgery   right breast upper outer quadrant lumpectomy 03/26/2016 for a pT1c pN1, stage IIA invasive ductal carcinoma, grade 1, estrogen and progesterone receptor positive, HER-2 nonamplified, with an MIB-1 of 12%              (a) mammaprint from this tumor was read as "low risk".          02/2016 Initial Diagnosis   Malignant neoplasm of upper-outer quadrant of right breast in female, estrogen receptor positive ([place])   03/26/2016 Surgery   a second right breast cancer, invasive lobular, grade 1, measuring 0.6 cm, focally involved the final right medial margin from the 03/26/2016 procedure; this tumor also was estrogen and progesterone receptor positive, HER-2 negative, with an MIB-1 of 3%             (a) medial margin cleared with additional surgery 05/20/2016.   03/2016 -  Anti-estrogen oral therapy   tamoxifen started 04/18/2016--to be continued a minimum of 5 years, likely followed by anastrozole x 2 years   08/19/2016 - 09/29/2016 Radiation Therapy   radiation therapy 08/19/16-09/29/16 with capecitabine sensitization Site/dose:   1) Right breast/ 50Gy in 25 fractions                         2) Right breast boost/ 10 Gy in 5 fractions                         3) Left breast 50.4 Gy in 28 fractions   Malignant neoplasm of overlapping sites of left breast in female, estrogen receptor positive ([place])  04/09/2016 Initial Biopsy   two biopsies from the left breast 04/09/2016 and 04/11/2016, 4.6 cm apart, showed             (a) centrally, an invasive ductal carcinoma, grade 1 or 2, with no prognostic panel available             (b) in the upper outer quadrant, invasive ductal carcinoma, grade 2, estrogen receptor 10% positive, progesterone receptor and HER-2  negative, with an MIB-1 of 2%             (c) left axillary lymph node biopsy 04/14/2016 showed invasive ductal carcinoma, estrogen and progesterone receptor negative             (d) mammaprint from (2)(c) was also "low risk"   03/2016 Initial Diagnosis   Malignant neoplasm of overlapping sites of left breast in female, estrogen receptor positive ([place])   03/2016 -  Anti-estrogen oral therapy   tamoxifen started 04/18/2016--to be continued a minimum of 5 years, likely followed by anastrozole x 2 years   05/20/2016 Surgery   left lumpectomy 05/20/2016 showed a  pT1c pN1 stage IIA  invasive ductal carcinoma, grade 2, estrogen receptor positive, progesterone receptor negative, HER-2 nonamplified, with an MIB-1 of 2%; margins were positive    07/01/2016 Surgery   status post left sided nipple sparing mastectomy with immediate expander placement 07/01/2016 showing multiple foci of residual grade 2 invasive ductal carcinoma, the largest measuring 0.7 cm, with evidence of lymphovascular invasion and multiple close but negative margins   08/19/2016 - 09/29/2016 Radiation Therapy   radiation therapy 08/19/16-09/29/16 with capecitabine sensitization Site/dose:   1) Right  breast/ 50Gy in 25 fractions                         2) Right breast boost/ 10 Gy in 5 fractions                         3) Left breast 50.4 Gy in 28 fractions    Patient postmenopausal bleeding in 2020 and was found to have a thickened endometrium.  She underwent hysteroscopic polypectomy with final pathology showing a benign uterine segment polyp.   Patient presented with pelvic discomfort, no postmenopausal bleeding since 2020.  She is on tamoxifen for her history of breast cancer.   Patient on 03/16/2022 shows a uterus measuring 6.5 x 5 x 3.8 cm with multiple fibroids measuring up to 1.5 cm.  Endometrial lining measures 15.8 mm and is heterogenous with feeder vessel.  Bilateral ovaries normal in appearance.  No adnexal masses or free  fluid.   Hysteroscopy with D&C was performed with findings of generally thickened endometrium.  Endometrial curettage on 04/21/2022 shows scant crushed endometrial and myometrial tissue.  While evaluation is limited by crush artifact, no definitive evidence of atypia or hyperplasia identified.   The patient's history is notable for ER/PR+ breast cancer on Tamoxifen for a planned 10 years (started in 03/2016).  Interval History: Doing well.  Notes some very mild "pinching" sensation in her pelvis for the last couple of days.  Denies true cramping.  Denies any bleeding.  Past Medical/Surgical History: Past Medical History:  Diagnosis Date   Allergy-induced asthma    prn inhaler   Arterial calcification    sees pcp for takes atorvastatin  for   CAD (coronary artery disease)    Constipation    Emphysema of lung (HCC)    Endometrial thickening on ultrasound    Environmental and seasonal allergies    Family history of adverse reaction to anesthesia    pt's father has hx. of being hard to wake up post-op x 1 time after 8 hour whipple surgery   GERD (gastroesophageal reflux disease)    Hemorrhoids    Hisory of Migraines    History of basal cell carcinoma (BCC) excision    right clavicle   History of breast cancer 2017   bilateral   History of radiation therapy    08/19/16- 09/29/16, Right Breast 50 Gy in 25 fractions, Right Breast boost 10 Gy in 5 fractions, Left Breast 50.4 Gy in 28 fractions   Hypertension    OSA (obstructive sleep apnea)    uses dental appliance mild /moderate osa   Osteoarthritis    hands and feet   Osteopenia 02/2016   T score -1.3 FRAX 8.3%/0.7% stable from prior DEXA   Overactive bladder    Stroke [facility])    Thyroid nodule 2012   Solitary nodule in the lower pole of the left thyroid lobe , pt unaware   Wears glasses for reading    Wears hearing aid in both ears     Past Surgical History:  Procedure Laterality Date   BREAST RECONSTRUCTION WITH PLACEMENT OF  TISSUE EXPANDER AND FLEX HD (ACELLULAR HYDRATED DERMIS) Left 07/01/2016   Procedure: BREAST RECONSTRUCTION WITH PLACEMENT OF TISSUE EXPANDER AND  ALLODERM;  Surgeon: Irene Limbo, MD;  Location: [place];  Service: Plastics;  Laterality: Left;   BROW LIFT     COLONOSCOPY  2022   DILATATION & CURETTAGE/HYSTEROSCOPY WITH MYOSURE N/A  12/06/2018   Procedure: DILATATION & CURETTAGE/HYSTEROSCOPY WITH MYOSURE;  Surgeon: [name], MD;  Location: [facility];  Service: Gynecology;  Laterality: N/A;  follow 1st case   DILATATION & CURETTAGE/HYSTEROSCOPY WITH MYOSURE N/A 04/21/2022   Procedure: DILATATION & CURETTAGE/HYSTEROSCOPY;  Surgeon: [name], MD;  Location: [facility];  Service: Gynecology;  Laterality: N/A;   INCISION AND DRAINAGE Left 06/02/2016   Procedure: DRAINAGE of left axilla seroma;  Surgeon: Rolm Bookbinder, MD;  Location: [place];  Service: General;  Laterality: Left;   LIPOSUCTION WITH LIPOFILLING Left 06/11/2018   Procedure: Lipofilling from abdomen to left chest;  Surgeon: Irene Limbo, MD;  Location: [place];  Service: Plastics;  Laterality: Left;   MASTOPEXY Right 04/17/2017   Procedure: RIGHT MASTOPEXY;  Surgeon: Irene Limbo, MD;  Location: [place];  Service: Plastics;  Laterality: Right;   NIPPLE SPARING MASTECTOMY Left 07/01/2016   Procedure: LEFT NIPPLE SPARING MASTECTOMY;  Surgeon: Rolm Bookbinder, MD;  Location: [place];  Service: General;  Laterality: Left;   OPERATIVE ULTRASOUND N/A 04/21/2022   Procedure: OPERATIVE ULTRASOUND;  Surgeon: [name], MD;  Location: [facility];  Service: Gynecology;  Laterality: N/A;   RADIOACTIVE SEED GUIDED PARTIAL MASTECTOMY WITH AXILLARY SENTINEL LYMPH NODE BIOPSY Right 03/26/2016   Procedure: RIGHT BREAST LUMPECTOMY WITH RADIOACTIVE SEED  AND SENTINEL LYMPH NODE BIOPSY;  Surgeon: Rolm Bookbinder, MD;  Location: [place];  Service: General;  Laterality: Right;   RADIOACTIVE SEED GUIDED PARTIAL MASTECTOMY WITH AXILLARY SENTINEL LYMPH NODE BIOPSY Left 05/20/2016   Procedure: LEFT BREAST RADIOACTIVE SEED (two seeds) GUIDED LUMPECTOMY WITH LEFT AXILLARY SENTINEL LYMPH NODE BIOPSY AND LEFT SEED TARGETED AXILLARY LYMPH NODE EXCISION;  Surgeon: Rolm Bookbinder, MD;  Location: [place];  Service: General;  Laterality: Left;   RE-EXCISION OF BREAST CANCER,SUPERIOR MARGINS Right 05/20/2016   Procedure: RE-EXCISION OF RIGHT BREAST MARGIN;  Surgeon: Rolm Bookbinder, MD;  Location: [place];  Service: General;  Laterality: Right;   RE-EXCISION OF BREAST CANCER,SUPERIOR MARGINS Left 06/02/2016   Procedure: RE-EXCISION OF BREAST CANCER,SUPERIOR MARGINS;  Surgeon: Rolm Bookbinder, MD;  Location: [place];  Service: General;  Laterality: Left;   REMOVAL OF TISSUE EXPANDER AND PLACEMENT OF IMPLANT Left 04/17/2017   Procedure: REMOVAL OF LEFT TISSUE EXPANDER WITH PLACEMENT OF LEFT SILICONE BREAST IMPLANT;  Surgeon: Irene Limbo, MD;  Location: [place];  Service: Plastics;  Laterality: Left;   shoulder surgery for arthritis Left 07/2021   arthroscopic done at [facility] [place]    Family History  Problem Relation Age of Onset   Hypertension Mother    Heart disease Mother    Lung disease Mother    Hypertension Father    Cancer Father        colon   Heart disease Father    Lung cancer Father    Anesthesia problems Father        hard to wake up post-op   Hypertension Brother    Hyperlipidemia Brother    Diabetes Maternal Grandfather    Breast cancer Paternal Grandmother        Age 9's   Ovarian cancer Neg Hx    Endometrial cancer Neg Hx    Pancreatic cancer Neg Hx    Prostate cancer Neg Hx     Social History   Socioeconomic  History  Marital status: Single    Spouse name: Not on file   Number of children: Not on file   Years of education: Not on file   Highest education level: Not on file  Occupational History   Not on file  Tobacco Use   Smoking status: Former    Packs/day: 1.00    Years: 25.00    Total pack years: 25.00    Types: Cigarettes    Quit date: 07/27/1990    Years since quitting: 31.9   Smokeless tobacco: Never  Vaping Use   Vaping Use: Never used  Substance and Sexual Activity   Alcohol use: Yes    Comment: 2-3 wine spritzer  per day   Drug use: Not Currently   Sexual activity: Not Currently    Partners: Male    Birth control/protection: Post-menopausal    Comment: 1st intercourse 78 yo-5 partners  Other Topics Concern   Not on file  Social History Narrative   Not on file   Social Determinants of Health   Financial Resource Strain: Not on file  Food Insecurity: Not on file  Transportation Needs: Not on file  Physical Activity: Not on file  Stress: Not on file  Social Connections: Not on file    Current Medications:  Current Outpatient Medications:    acetaminophen (TYLENOL) 500 MG tablet, Take 500 mg by mouth every 6 (six) hours as needed for moderate pain., Disp: , Rfl:    albuterol (PROAIR HFA) 108 (90 Base) MCG/ACT inhaler, Inhale 2 puffs into the lungs every 4 (four) hours as needed for wheezing or shortness of breath., Disp: 1 Inhaler, Rfl: 1   ALPRAZolam (XANAX) 0.25 MG tablet, Take 0.25 mg by mouth daily as needed (travel)., Disp: , Rfl:    amLODipine (NORVASC) 2.5 MG tablet, Take 2.5 mg by mouth daily., Disp: , Rfl:    atorvastatin (LIPITOR) 10 MG tablet, Take 10 mg by mouth at bedtime., Disp: , Rfl:    azelastine (ASTELIN) 0.1 % nasal spray, Place 1 spray into both nostrils daily., Disp: , Rfl:    cetirizine (ZYRTEC) 10 MG tablet, Take 10 mg by mouth at bedtime., Disp: , Rfl:    FLOVENT HFA 110 MCG/ACT inhaler, INHALE 1 PUFF BY MOUTH INTO THE LUNGS TWICE DAILY  (Patient taking differently: Inhale 2 puffs into the lungs 2 (two) times daily as needed (shortness of breath).), Disp: 12 g, Rfl: 0   fluticasone (FLONASE) 50 MCG/ACT nasal spray, Place 1 spray into both nostrils daily., Disp: , Rfl:    hydrocortisone (ANUSOL-HC) 2.5 % rectal cream, Place 1 application rectally as needed for hemorrhoids or itching., Disp: , Rfl:    ibuprofen (ADVIL) 200 MG tablet, Take 200 mg by mouth every 6 (six) hours as needed for moderate pain., Disp: , Rfl:    ibuprofen (ADVIL) 600 MG tablet, Take 1 tablet (600 mg total) by mouth every 6 (six) hours as needed for mild pain., Disp: 30 tablet, Rfl: 0   mometasone (NASONEX) 50 MCG/ACT nasal spray, Place 2 sprays into the nose as needed., Disp: , Rfl:    omeprazole (PRILOSEC) 20 MG capsule, Take 20 mg by mouth daily., Disp: , Rfl:    OVER THE COUNTER MEDICATION, Take 2 capsules by mouth daily. MEGASPOREBIOTIC, Disp: , Rfl:    psyllium (METAMUCIL) 58.6 % packet, Take 1 packet by mouth daily., Disp: , Rfl:    RESTASIS 0.05 % ophthalmic emulsion, Place 1 drop into both eyes 2 (two) times daily., Disp: , Rfl:  triamcinolone (KENALOG) 0.1 %, Apply 1 Application topically daily as needed (psoriasis)., Disp: , Rfl:    valACYclovir (VALTREX) 1000 MG tablet, Take 1 tablet (1,000 mg total) by mouth 2 (two) times daily. Take for 1 day as directed above as needed. (Patient taking differently: Take 1,000 mg by mouth 2 (two) times daily as needed (fever blisters).), Disp: 30 tablet, Rfl: 1   valsartan-hydrochlorothiazide (DIOVAN-HCT) 160-12.5 MG tablet, Take 1 tablet by mouth daily., Disp: , Rfl:    venlafaxine XR (EFFEXOR-XR) 75 MG 24 hr capsule, TAKE 1 CAPSULE(75 MG) BY MOUTH DAILY WITH BREAKFAST, Disp: 90 capsule, Rfl: 4   VITAMIN D PO, Take 2,000 Units by mouth daily., Disp: , Rfl:    senna-docusate (SENOKOT-S) 8.6-50 MG tablet, Take 2 tablets by mouth at bedtime. For AFTER surgery, do not take if having diarrhea, Disp: 30 tablet, Rfl: 0    tamoxifen (NOLVADEX) 20 MG tablet, Take 1 tablet (20 mg total) by mouth daily. (Patient not taking: Reported on 06/23/2022), Disp: 90 tablet, Rfl: 3   traMADol (ULTRAM) 50 MG tablet, Take 1 tablet (50 mg total) by mouth every 6 (six) hours as needed for severe pain. For AFTER surgery only, do not take and drive, Disp: 10 tablet, Rfl: 0  Review of Systems: + Hearing loss, ringing in ears, hot flashes, joint pain. Denies appetite changes, fevers, chills, fatigue, unexplained weight changes. Denies neck lumps or masses, mouth sores, or voice changes. Denies cough or wheezing.  Denies shortness of breath. Denies chest pain or palpitations. Denies leg swelling. Denies abdominal distention, pain, blood in stools, constipation, diarrhea, nausea, vomiting, or early satiety. Denies pain with intercourse, dysuria, frequency, hematuria or incontinence. Denies pelvic pain, vaginal bleeding or vaginal discharge.   Denies back pain or muscle pain/cramps. Denies itching, rash, or wounds. Denies dizziness, headaches, numbness or seizures. Denies swollen lymph nodes or glands, denies easy bruising or bleeding. Denies anxiety, depression, confusion, or decreased concentration.  Physical Exam: BP (!) 147/80 (BP Location: Left Arm, Patient Position: Sitting) Comment: BP meds taken;  Pulse 70   Temp 98.4 F (36.9 C) (Oral)   Resp 16   Ht 5' 3.39" (1.61 m)   Wt 161 lb 9.6 oz (73.3 kg)   LMP 07/28/1996 (Approximate)   SpO2 100%   BMI 28.28 kg/m  General: Alert, oriented, no acute distress.  HEENT: Normocephalic, atraumatic. Sclera anicteric.  Chest: Clear to auscultation bilaterally. No wheezes, rhonchi, or rales. Cardiovascular: Regular rate and rhythm, no murmurs, rubs, or gallops.   Laboratory & Radiologic Studies: CBC    Component Value Date/Time   WBC 5.9 06/17/2022 0844   RBC 4.40 06/17/2022 0844   HGB 13.4 06/17/2022 0844   HGB 13.5 06/05/2022 1046   HGB 14.1 03/12/2017 1115   HCT 40.7  06/17/2022 0844   HCT 41.9 03/12/2017 1115   PLT 330 06/17/2022 0844   PLT 336 06/05/2022 1046   PLT 333 03/12/2017 1115   MCV 92.5 06/17/2022 0844   MCV 92.6 03/12/2017 1115   MCH 30.5 06/17/2022 0844   MCHC 32.9 06/17/2022 0844   RDW 13.7 06/17/2022 0844   RDW 15.5 (H) 03/12/2017 1115   LYMPHSABS 1.1 06/05/2022 1046   LYMPHSABS 0.9 03/12/2017 1115   MONOABS 0.6 06/05/2022 1046   MONOABS 0.7 03/12/2017 1115   EOSABS 0.2 06/05/2022 1046   EOSABS 0.0 03/12/2017 1115   BASOSABS 0.1 06/05/2022 1046   BASOSABS 0.1 03/12/2017 1115      Latest Ref Rng & Units 06/17/2022  8:44 AM 06/05/2022   10:46 AM 04/21/2022    7:58 AM  BMP  Glucose 70 - 99 mg/dL 85  91  92   BUN 8 - 23 mg/dL _0 Creatinine 0.44 - 1.00 mg/dL 0.65  0.77  0.50   Sodium 135 - 145 mmol/L 132  135  136   Potassium 3.5 - 5.1 mmol/L 4.3  4.2  3.9   Chloride 98 - 111 mmol/L 100  102  104   CO2 22 - 32 mmol/L 25  27    Calcium 8.9 - 10.3 mg/dL 9.1  9.2      Assessment & Plan: ANNALINA NEEDLES is a 71 y.o. woman with thickened endometrium on tamoxifen with recent scant sampling that was somewhat limited by crush artifact.  The patient and I have previously discussed decision regarding further diagnostic surgery versus definitive surgery.  After considering these options, she is ultimately decided to proceed with definitive hysterectomy.  She understands that the plan will be to send the uterus for frozen section at the time of her surgery.  Any additional procedures, such as staging in the setting of endometrial cancer, will be dictated based on frozen section results.  We reviewed the plan for a robotic assisted hysterectomy, bilateral salpingo-oophorectomy, possible staging including lymph node dissection, possible laparotomy. The risks of surgery were discussed in detail and she understands these to include infection; wound separation; hernia; vaginal cuff separation, injury to adjacent organs such as bowel,  bladder, blood vessels, ureters and nerves; bleeding which may require blood transfusion; anesthesia risk; thromboembolic events; possible death; unforeseen complications; possible need for re-exploration; medical complications such as heart attack, stroke, pleural effusion and pneumonia; and, if full lymphadenectomy is performed the risk of lymphedema and lymphocyst. The patient will receive DVT and antibiotic prophylaxis as indicated. She voiced a clear understanding. She had the opportunity to ask questions. Perioperative instructions were reviewed with her. Prescriptions for post-op medications were sent to her pharmacy of choice.  28 minutes of total time was spent for this patient encounter, including preparation, face-to-face counseling with the patient and coordination of care, and documentation of the encounter.  Jeral Pinch, MD  Division of Gynecologic Oncology  Department of Obstetrics and Gynecology  [facility]

## 2022-06-24 NOTE — H&P (View-Only) (Signed)
Gynecologic Oncology Return Clinic Visit  06/24/22  Reason for Visit: treatment planning  Treatment History: Oncology History  Malignant neoplasm of upper-outer quadrant of right breast in female, estrogen receptor positive ([place])  03/26/2016 Surgery   right breast upper outer quadrant lumpectomy 03/26/2016 for a pT1c pN1, stage IIA invasive ductal carcinoma, grade 1, estrogen and progesterone receptor positive, HER-2 nonamplified, with an MIB-1 of 12%              (a) mammaprint from this tumor was read as "low risk".          02/2016 Initial Diagnosis   Malignant neoplasm of upper-outer quadrant of right breast in female, estrogen receptor positive ([place])   03/26/2016 Surgery   a second right breast cancer, invasive lobular, grade 1, measuring 0.6 cm, focally involved the final right medial margin from the 03/26/2016 procedure; this tumor also was estrogen and progesterone receptor positive, HER-2 negative, with an MIB-1 of 3%             (a) medial margin cleared with additional surgery 05/20/2016.   03/2016 -  Anti-estrogen oral therapy   tamoxifen started 04/18/2016--to be continued a minimum of 5 years, likely followed by anastrozole x 2 years   08/19/2016 - 09/29/2016 Radiation Therapy   radiation therapy 08/19/16-09/29/16 with capecitabine sensitization Site/dose:   1) Right breast/ 50Gy in 25 fractions                         2) Right breast boost/ 10 Gy in 5 fractions                         3) Left breast 50.4 Gy in 28 fractions   Malignant neoplasm of overlapping sites of left breast in female, estrogen receptor positive ([place])  04/09/2016 Initial Biopsy   two biopsies from the left breast 04/09/2016 and 04/11/2016, 4.6 cm apart, showed             (a) centrally, an invasive ductal carcinoma, grade 1 or 2, with no prognostic panel available             (b) in the upper outer quadrant, invasive ductal carcinoma, grade 2, estrogen receptor 10% positive, progesterone receptor and HER-2  negative, with an MIB-1 of 2%             (c) left axillary lymph node biopsy 04/14/2016 showed invasive ductal carcinoma, estrogen and progesterone receptor negative             (d) mammaprint from (2)(c) was also "low risk"   03/2016 Initial Diagnosis   Malignant neoplasm of overlapping sites of left breast in female, estrogen receptor positive ([place])   03/2016 -  Anti-estrogen oral therapy   tamoxifen started 04/18/2016--to be continued a minimum of 5 years, likely followed by anastrozole x 2 years   05/20/2016 Surgery   left lumpectomy 05/20/2016 showed a  pT1c pN1 stage IIA  invasive ductal carcinoma, grade 2, estrogen receptor positive, progesterone receptor negative, HER-2 nonamplified, with an MIB-1 of 2%; margins were positive    07/01/2016 Surgery   status post left sided nipple sparing mastectomy with immediate expander placement 07/01/2016 showing multiple foci of residual grade 2 invasive ductal carcinoma, the largest measuring 0.7 cm, with evidence of lymphovascular invasion and multiple close but negative margins   08/19/2016 - 09/29/2016 Radiation Therapy   radiation therapy 08/19/16-09/29/16 with capecitabine sensitization Site/dose:   1) Right  breast/ 50Gy in 25 fractions                         2) Right breast boost/ 10 Gy in 5 fractions                         3) Left breast 50.4 Gy in 28 fractions    Patient postmenopausal bleeding in 2020 and was found to have a thickened endometrium.  She underwent hysteroscopic polypectomy with final pathology showing a benign uterine segment polyp.   Patient presented with pelvic discomfort, no postmenopausal bleeding since 2020.  She is on tamoxifen for her history of breast cancer.   Patient on 03/16/2022 shows a uterus measuring 6.5 x 5 x 3.8 cm with multiple fibroids measuring up to 1.5 cm.  Endometrial lining measures 15.8 mm and is heterogenous with feeder vessel.  Bilateral ovaries normal in appearance.  No adnexal masses or free  fluid.   Hysteroscopy with D&C was performed with findings of generally thickened endometrium.  Endometrial curettage on 04/21/2022 shows scant crushed endometrial and myometrial tissue.  While evaluation is limited by crush artifact, no definitive evidence of atypia or hyperplasia identified.   The patient's history is notable for ER/PR+ breast cancer on Tamoxifen for a planned 10 years (started in 03/2016).  Interval History: Doing well.  Notes some very mild "pinching" sensation in her pelvis for the last couple of days.  Denies true cramping.  Denies any bleeding.  Past Medical/Surgical History: Past Medical History:  Diagnosis Date   Allergy-induced asthma    prn inhaler   Arterial calcification    sees pcp for takes atorvastatin  for   CAD (coronary artery disease)    Constipation    Emphysema of lung (HCC)    Endometrial thickening on ultrasound    Environmental and seasonal allergies    Family history of adverse reaction to anesthesia    pt's father has hx. of being hard to wake up post-op x 1 time after 8 hour whipple surgery   GERD (gastroesophageal reflux disease)    Hemorrhoids    Hisory of Migraines    History of basal cell carcinoma (BCC) excision    right clavicle   History of breast cancer 2017   bilateral   History of radiation therapy    08/19/16- 09/29/16, Right Breast 50 Gy in 25 fractions, Right Breast boost 10 Gy in 5 fractions, Left Breast 50.4 Gy in 28 fractions   Hypertension    OSA (obstructive sleep apnea)    uses dental appliance mild /moderate osa   Osteoarthritis    hands and feet   Osteopenia 02/2016   T score -1.3 FRAX 8.3%/0.7% stable from prior DEXA   Overactive bladder    Stroke (HCC)    Thyroid nodule 2012   Solitary nodule in the lower pole of the left thyroid lobe , pt unaware   Wears glasses for reading    Wears hearing aid in both ears     Past Surgical History:  Procedure Laterality Date   BREAST RECONSTRUCTION WITH PLACEMENT OF  TISSUE EXPANDER AND FLEX HD (ACELLULAR HYDRATED DERMIS) Left 07/01/2016   Procedure: BREAST RECONSTRUCTION WITH PLACEMENT OF TISSUE EXPANDER AND  ALLODERM;  Surgeon: Brinda Thimmappa, MD;  Location: [facility];  Service: Plastics;  Laterality: Left;   BROW LIFT     COLONOSCOPY  2022   DILATATION & CURETTAGE/HYSTEROSCOPY WITH MYOSURE N/A   12/06/2018   Procedure: DILATATION & CURETTAGE/HYSTEROSCOPY WITH MYOSURE;  Surgeon: Amundson C Silva, Brook E, MD;  Location: [facility];  Service: Gynecology;  Laterality: N/A;  follow 1st case   DILATATION & CURETTAGE/HYSTEROSCOPY WITH MYOSURE N/A 04/21/2022   Procedure: DILATATION & CURETTAGE/HYSTEROSCOPY;  Surgeon: Amundson C Silva, Brook E, MD;  Location: [facility];  Service: Gynecology;  Laterality: N/A;   INCISION AND DRAINAGE Left 06/02/2016   Procedure: DRAINAGE of left axilla seroma;  Surgeon: Matthew Wakefield, MD;  Location: [facility];  Service: General;  Laterality: Left;   LIPOSUCTION WITH LIPOFILLING Left 06/11/2018   Procedure: Lipofilling from abdomen to left chest;  Surgeon: Thimmappa, Brinda, MD;  Location: [facility];  Service: Plastics;  Laterality: Left;   MASTOPEXY Right 04/17/2017   Procedure: RIGHT MASTOPEXY;  Surgeon: Thimmappa, Brinda, MD;  Location: [facility];  Service: Plastics;  Laterality: Right;   NIPPLE SPARING MASTECTOMY Left 07/01/2016   Procedure: LEFT NIPPLE SPARING MASTECTOMY;  Surgeon: Matthew Wakefield, MD;  Location: [facility];  Service: General;  Laterality: Left;   OPERATIVE ULTRASOUND N/A 04/21/2022   Procedure: OPERATIVE ULTRASOUND;  Surgeon: Amundson C Silva, Brook E, MD;  Location: [facility];  Service: Gynecology;  Laterality: N/A;   RADIOACTIVE SEED GUIDED PARTIAL MASTECTOMY WITH AXILLARY SENTINEL LYMPH NODE BIOPSY Right 03/26/2016   Procedure: RIGHT BREAST LUMPECTOMY WITH RADIOACTIVE SEED  AND SENTINEL LYMPH NODE BIOPSY;  Surgeon: Matthew Wakefield, MD;  Location: [facility];  Service: General;  Laterality: Right;   RADIOACTIVE SEED GUIDED PARTIAL MASTECTOMY WITH AXILLARY SENTINEL LYMPH NODE BIOPSY Left 05/20/2016   Procedure: LEFT BREAST RADIOACTIVE SEED (two seeds) GUIDED LUMPECTOMY WITH LEFT AXILLARY SENTINEL LYMPH NODE BIOPSY AND LEFT SEED TARGETED AXILLARY LYMPH NODE EXCISION;  Surgeon: Matthew Wakefield, MD;  Location: [facility];  Service: General;  Laterality: Left;   RE-EXCISION OF BREAST CANCER,SUPERIOR MARGINS Right 05/20/2016   Procedure: RE-EXCISION OF RIGHT BREAST MARGIN;  Surgeon: Matthew Wakefield, MD;  Location: [facility];  Service: General;  Laterality: Right;   RE-EXCISION OF BREAST CANCER,SUPERIOR MARGINS Left 06/02/2016   Procedure: RE-EXCISION OF BREAST CANCER,SUPERIOR MARGINS;  Surgeon: Matthew Wakefield, MD;  Location: [facility];  Service: General;  Laterality: Left;   REMOVAL OF TISSUE EXPANDER AND PLACEMENT OF IMPLANT Left 04/17/2017   Procedure: REMOVAL OF LEFT TISSUE EXPANDER WITH PLACEMENT OF LEFT SILICONE BREAST IMPLANT;  Surgeon: Thimmappa, Brinda, MD;  Location: [facility];  Service: Plastics;  Laterality: Left;   shoulder surgery for arthritis Left 07/2021   arthroscopic done at [facility] [place]    Family History  Problem Relation Age of Onset   Hypertension Mother    Heart disease Mother    Lung disease Mother    Hypertension Father    Cancer Father        colon   Heart disease Father    Lung cancer Father    Anesthesia problems Father        hard to wake up post-op   Hypertension Brother    Hyperlipidemia Brother    Diabetes Maternal Grandfather    Breast cancer Paternal Grandmother        Age 60's   Ovarian cancer Neg Hx    Endometrial cancer Neg Hx    Pancreatic cancer Neg Hx    Prostate cancer Neg Hx     Social History   Socioeconomic  History     Marital status: Single    Spouse name: Not on file   Number of children: Not on file   Years of education: Not on file   Highest education level: Not on file  Occupational History   Not on file  Tobacco Use   Smoking status: Former    Packs/day: 1.00    Years: 25.00    Total pack years: 25.00    Types: Cigarettes    Quit date: 07/27/1990    Years since quitting: 31.9   Smokeless tobacco: Never  Vaping Use   Vaping Use: Never used  Substance and Sexual Activity   Alcohol use: Yes    Comment: 2-3 wine spritzer  per day   Drug use: Not Currently   Sexual activity: Not Currently    Partners: Male    Birth control/protection: Post-menopausal    Comment: 1st intercourse 22 yo-5 partners  Other Topics Concern   Not on file  Social History Narrative   Not on file   Social Determinants of Health   Financial Resource Strain: Not on file  Food Insecurity: Not on file  Transportation Needs: Not on file  Physical Activity: Not on file  Stress: Not on file  Social Connections: Not on file    Current Medications:  Current Outpatient Medications:    acetaminophen (TYLENOL) 500 MG tablet, Take 500 mg by mouth every 6 (six) hours as needed for moderate pain., Disp: , Rfl:    albuterol (PROAIR HFA) 108 (90 Base) MCG/ACT inhaler, Inhale 2 puffs into the lungs every 4 (four) hours as needed for wheezing or shortness of breath., Disp: 1 Inhaler, Rfl: 1   ALPRAZolam (XANAX) 0.25 MG tablet, Take 0.25 mg by mouth daily as needed (travel)., Disp: , Rfl:    amLODipine (NORVASC) 2.5 MG tablet, Take 2.5 mg by mouth daily., Disp: , Rfl:    atorvastatin (LIPITOR) 10 MG tablet, Take 10 mg by mouth at bedtime., Disp: , Rfl:    azelastine (ASTELIN) 0.1 % nasal spray, Place 1 spray into both nostrils daily., Disp: , Rfl:    cetirizine (ZYRTEC) 10 MG tablet, Take 10 mg by mouth at bedtime., Disp: , Rfl:    FLOVENT HFA 110 MCG/ACT inhaler, INHALE 1 PUFF BY MOUTH INTO THE LUNGS TWICE DAILY  (Patient taking differently: Inhale 2 puffs into the lungs 2 (two) times daily as needed (shortness of breath).), Disp: 12 g, Rfl: 0   fluticasone (FLONASE) 50 MCG/ACT nasal spray, Place 1 spray into both nostrils daily., Disp: , Rfl:    hydrocortisone (ANUSOL-HC) 2.5 % rectal cream, Place 1 application rectally as needed for hemorrhoids or itching., Disp: , Rfl:    ibuprofen (ADVIL) 200 MG tablet, Take 200 mg by mouth every 6 (six) hours as needed for moderate pain., Disp: , Rfl:    ibuprofen (ADVIL) 600 MG tablet, Take 1 tablet (600 mg total) by mouth every 6 (six) hours as needed for mild pain., Disp: 30 tablet, Rfl: 0   mometasone (NASONEX) 50 MCG/ACT nasal spray, Place 2 sprays into the nose as needed., Disp: , Rfl:    omeprazole (PRILOSEC) 20 MG capsule, Take 20 mg by mouth daily., Disp: , Rfl:    OVER THE COUNTER MEDICATION, Take 2 capsules by mouth daily. MEGASPOREBIOTIC, Disp: , Rfl:    psyllium (METAMUCIL) 58.6 % packet, Take 1 packet by mouth daily., Disp: , Rfl:    RESTASIS 0.05 % ophthalmic emulsion, Place 1 drop into both eyes 2 (two) times daily., Disp: , Rfl:  triamcinolone (KENALOG) 0.1 %, Apply 1 Application topically daily as needed (psoriasis)., Disp: , Rfl:    valACYclovir (VALTREX) 1000 MG tablet, Take 1 tablet (1,000 mg total) by mouth 2 (two) times daily. Take for 1 day as directed above as needed. (Patient taking differently: Take 1,000 mg by mouth 2 (two) times daily as needed (fever blisters).), Disp: 30 tablet, Rfl: 1   valsartan-hydrochlorothiazide (DIOVAN-HCT) 160-12.5 MG tablet, Take 1 tablet by mouth daily., Disp: , Rfl:    venlafaxine XR (EFFEXOR-XR) 75 MG 24 hr capsule, TAKE 1 CAPSULE(75 MG) BY MOUTH DAILY WITH BREAKFAST, Disp: 90 capsule, Rfl: 4   VITAMIN D PO, Take 2,000 Units by mouth daily., Disp: , Rfl:    senna-docusate (SENOKOT-S) 8.6-50 MG tablet, Take 2 tablets by mouth at bedtime. For AFTER surgery, do not take if having diarrhea, Disp: 30 tablet, Rfl: 0    tamoxifen (NOLVADEX) 20 MG tablet, Take 1 tablet (20 mg total) by mouth daily. (Patient not taking: Reported on 06/23/2022), Disp: 90 tablet, Rfl: 3   traMADol (ULTRAM) 50 MG tablet, Take 1 tablet (50 mg total) by mouth every 6 (six) hours as needed for severe pain. For AFTER surgery only, do not take and drive, Disp: 10 tablet, Rfl: 0  Review of Systems: + Hearing loss, ringing in ears, hot flashes, joint pain. Denies appetite changes, fevers, chills, fatigue, unexplained weight changes. Denies neck lumps or masses, mouth sores, or voice changes. Denies cough or wheezing.  Denies shortness of breath. Denies chest pain or palpitations. Denies leg swelling. Denies abdominal distention, pain, blood in stools, constipation, diarrhea, nausea, vomiting, or early satiety. Denies pain with intercourse, dysuria, frequency, hematuria or incontinence. Denies pelvic pain, vaginal bleeding or vaginal discharge.   Denies back pain or muscle pain/cramps. Denies itching, rash, or wounds. Denies dizziness, headaches, numbness or seizures. Denies swollen lymph nodes or glands, denies easy bruising or bleeding. Denies anxiety, depression, confusion, or decreased concentration.  Physical Exam: BP (!) 147/80 (BP Location: Left Arm, Patient Position: Sitting) Comment: BP meds taken;  Pulse 70   Temp 98.4 F (36.9 C) (Oral)   Resp 16   Ht 5' 3.39" (1.61 m)   Wt 161 lb 9.6 oz (73.3 kg)   LMP 07/28/1996 (Approximate)   SpO2 100%   BMI 28.28 kg/m  General: Alert, oriented, no acute distress.  HEENT: Normocephalic, atraumatic. Sclera anicteric.  Chest: Clear to auscultation bilaterally. No wheezes, rhonchi, or rales. Cardiovascular: Regular rate and rhythm, no murmurs, rubs, or gallops.   Laboratory & Radiologic Studies: CBC    Component Value Date/Time   WBC 5.9 06/17/2022 0844   RBC 4.40 06/17/2022 0844   HGB 13.4 06/17/2022 0844   HGB 13.5 06/05/2022 1046   HGB 14.1 03/12/2017 1115   HCT 40.7  06/17/2022 0844   HCT 41.9 03/12/2017 1115   PLT 330 06/17/2022 0844   PLT 336 06/05/2022 1046   PLT 333 03/12/2017 1115   MCV 92.5 06/17/2022 0844   MCV 92.6 03/12/2017 1115   MCH 30.5 06/17/2022 0844   MCHC 32.9 06/17/2022 0844   RDW 13.7 06/17/2022 0844   RDW 15.5 (H) 03/12/2017 1115   LYMPHSABS 1.1 06/05/2022 1046   LYMPHSABS 0.9 03/12/2017 1115   MONOABS 0.6 06/05/2022 1046   MONOABS 0.7 03/12/2017 1115   EOSABS 0.2 06/05/2022 1046   EOSABS 0.0 03/12/2017 1115   BASOSABS 0.1 06/05/2022 1046   BASOSABS 0.1 03/12/2017 1115      Latest Ref Rng & Units 06/17/2022  8:44 AM 06/05/2022   10:46 AM 04/21/2022    7:58 AM  BMP  Glucose 70 - 99 mg/dL 85  91  92   BUN 8 - 23 mg/dL 20  15  15   Creatinine 0.44 - 1.00 mg/dL 0.65  0.77  0.50   Sodium 135 - 145 mmol/L 132  135  136   Potassium 3.5 - 5.1 mmol/L 4.3  4.2  3.9   Chloride 98 - 111 mmol/L 100  102  104   CO2 22 - 32 mmol/L 25  27    Calcium 8.9 - 10.3 mg/dL 9.1  9.2      Assessment & Plan: Sabrina Tapia is a 71 y.o. woman with thickened endometrium on tamoxifen with recent scant sampling that was somewhat limited by crush artifact.  The patient and I have previously discussed decision regarding further diagnostic surgery versus definitive surgery.  After considering these options, she is ultimately decided to proceed with definitive hysterectomy.  She understands that the plan will be to send the uterus for frozen section at the time of her surgery.  Any additional procedures, such as staging in the setting of endometrial cancer, will be dictated based on frozen section results.  We reviewed the plan for a robotic assisted hysterectomy, bilateral salpingo-oophorectomy, possible staging including lymph node dissection, possible laparotomy. The risks of surgery were discussed in detail and she understands these to include infection; wound separation; hernia; vaginal cuff separation, injury to adjacent organs such as bowel,  bladder, blood vessels, ureters and nerves; bleeding which may require blood transfusion; anesthesia risk; thromboembolic events; possible death; unforeseen complications; possible need for re-exploration; medical complications such as heart attack, stroke, pleural effusion and pneumonia; and, if full lymphadenectomy is performed the risk of lymphedema and lymphocyst. The patient will receive DVT and antibiotic prophylaxis as indicated. She voiced a clear understanding. She had the opportunity to ask questions. Perioperative instructions were reviewed with her. Prescriptions for post-op medications were sent to her pharmacy of choice.  28 minutes of total time was spent for this patient encounter, including preparation, face-to-face counseling with the patient and coordination of care, and documentation of the encounter.  Jarome Trull, MD  Division of Gynecologic Oncology  Department of Obstetrics and Gynecology  [facility]   

## 2022-06-24 NOTE — Patient Instructions (Addendum)
Preparing for your Surgery  Plan for surgery on June 26, 2022 with Dr. Jeral Pinch at [facility] will be scheduled for robotic assisted laparoscopic total hysterectomy (removal of the uterus and cervix), bilateral salpingo-oophorectomy (removal of both ovaries and fallopian tubes), possible staging if a precancer or cancer is identified.   Tamoxifen should be held one week before and do not restart until active.  DO NOT DRINK ANY LIQUIDS STARTING 3 HOURS BEFORE YOUR PROCEDURE  Pre-operative Testing -(Done, 11/21) You will receive a phone call from presurgical testing at [facility] to arrange for a pre-operative appointment and lab work.  -Bring your insurance card, copy of an advanced directive if applicable, medication list  -At that visit, you will be asked to sign a consent for a possible blood transfusion in case a transfusion becomes necessary during surgery.  The need for a blood transfusion is rare but having consent is a necessary part of your care.     -You should not be taking blood thinners or aspirin at least ten days prior to surgery unless instructed by your surgeon.  -Do not take supplements such as fish oil (omega 3), red yeast rice, turmeric before your surgery. You want to avoid medications with aspirin in them including headache powders such as BC or Goody's), Excedrin migraine.  Day Before Surgery at [facility] will be asked to take in a light diet the day before surgery. You will be advised you can have clear liquids up until 3 hours before your surgery.    Eat a light diet the day before surgery.  Examples including soups, broths, toast, yogurt, mashed potatoes.  AVOID GAS PRODUCING FOODS. Things to avoid include carbonated beverages (fizzy beverages, sodas), raw fruits and raw vegetables (uncooked), or beans.   If your bowels are filled with gas, your surgeon will have difficulty visualizing your pelvic organs which increases your  surgical risks.  Your role in recovery Your role is to become active as soon as directed by your doctor, while still giving yourself time to heal.  Rest when you feel tired. You will be asked to do the following in order to speed your recovery:  - Cough and breathe deeply. This helps to clear and expand your lungs and can prevent pneumonia after surgery.  - [place]. Do mild physical activity. Walking or moving your legs help your circulation and body functions return to normal. Do not try to get up or walk alone the first time after surgery.   -If you develop swelling on one leg or the other, pain in the back of your leg, redness/warmth in one of your legs, please call the office or go to the Emergency Room to have a doppler to rule out a blood clot. For shortness of breath, chest pain-seek care in the Emergency Room as soon as possible. - Actively manage your pain. Managing your pain lets you move in comfort. We will ask you to rate your pain on a scale of zero to 10. It is your responsibility to tell your doctor or nurse where and how much you hurt so your pain can be treated.  Special Considerations -If you are diabetic, you may be placed on insulin after surgery to have closer control over your blood sugars to promote healing and recovery.  This does not mean that you will be discharged on insulin.  If applicable, your oral antidiabetics will be resumed when you are tolerating a solid  diet.  -Your final pathology results from surgery should be available around one week after surgery and the results will be relayed to you when available.  -FMLA forms can be faxed to [phone] and please allow 5-7 business days for completion.  Pain Management After Surgery -You have been prescribed your pain medication and bowel regimen medications before surgery so that you can have these available when you are discharged from the hospital. The pain medication is for use ONLY AFTER  surgery and a new prescription will not be given.   -Make sure that you have Tylenol and Ibuprofen IF YOU ARE ABLE TO TAKE THESE MEDICATIONS at home to use on a regular basis after surgery for pain control. We recommend alternating the medications every hour to six hours since they work differently and are processed in the body differently for pain relief.  -Review the attached handout on narcotic use and their risks and side effects.   Bowel Regimen -You have been prescribed Sennakot-S to take nightly to prevent constipation especially if you are taking the narcotic pain medication intermittently.  It is important to prevent constipation and drink adequate amounts of liquids. You can stop taking this medication when you are not taking pain medication and you are back on your normal bowel routine.  Risks of Surgery Risks of surgery are low but include bleeding, infection, damage to surrounding structures, re-operation, blood clots, and very rarely death.   Blood Transfusion Information (For the consent to be signed before surgery)  We will be checking your blood type before surgery so in case of emergencies, we will know what type of blood you would need.                                            WHAT IS A BLOOD TRANSFUSION?  A transfusion is the replacement of blood or some of its parts. Blood is made up of multiple cells which provide different functions. Red blood cells carry oxygen and are used for blood loss replacement. White blood cells fight against infection. Platelets control bleeding. Plasma helps clot blood. Other blood products are available for specialized needs, such as hemophilia or other clotting disorders. BEFORE THE TRANSFUSION  Who gives blood for transfusions?  You may be able to donate blood to be used at a later date on yourself (autologous donation). Relatives can be asked to donate blood. This is generally not any safer than if you have received blood from a  stranger. The same precautions are taken to ensure safety when a relative's blood is donated. Healthy volunteers who are fully evaluated to make sure their blood is safe. This is blood bank blood. Transfusion therapy is the safest it has ever been in the practice of medicine. Before blood is taken from a donor, a complete history is taken to make sure that person has no history of diseases nor engages in risky social behavior (examples are intravenous drug use or sexual activity with multiple partners). The donor's travel history is screened to minimize risk of transmitting infections, such as malaria. The donated blood is tested for signs of infectious diseases, such as HIV and hepatitis. The blood is then tested to be sure it is compatible with you in order to minimize the chance of a transfusion reaction. If you or a relative donates blood, this is often done in anticipation of surgery and  is not appropriate for emergency situations. It takes many days to process the donated blood. RISKS AND COMPLICATIONS Although transfusion therapy is very safe and saves many lives, the main dangers of transfusion include:  Getting an infectious disease. Developing a transfusion reaction. This is an allergic reaction to something in the blood you were given. Every precaution is taken to prevent this. The decision to have a blood transfusion has been considered carefully by your caregiver before blood is given. Blood is not given unless the benefits outweigh the risks.  AFTER SURGERY INSTRUCTIONS  Return to work: 4-6 weeks if applicable  Activity: 1. Be up and out of the bed during the day.  Take a nap if needed.  You may walk up steps but be careful and use the hand rail.  Stair climbing will tire you more than you think, you may need to stop part way and rest.   2. No lifting or straining for 6 weeks over 10 pounds. No pushing, pulling, straining for 6 weeks.  3. No driving for around 1 week(s).  Do not drive  if you are taking narcotic pain medicine and make sure that your reaction time has returned.   4. You can shower as soon as the next day after surgery. Shower daily.  Use your regular soap and water (not directly on the incision) and pat your incision(s) dry afterwards; don't rub.  No tub baths or submerging your body in water until cleared by your surgeon. If you have the soap that was given to you by pre-surgical testing that was used before surgery, you do not need to use it afterwards because this can irritate your incisions.   5. No sexual activity and nothing in the vagina for 8-10 weeks.  6. You may experience a small amount of clear drainage from your incisions, which is normal.  If the drainage persists, increases, or changes color please call the office.  7. Do not use creams, lotions, or ointments such as neosporin on your incisions after surgery until advised by your surgeon because they can cause removal of the dermabond glue on your incisions.    8. You may experience vaginal spotting after surgery or around the 6-8 week mark from surgery when the stitches at the top of the vagina begin to dissolve.  The spotting is normal but if you experience heavy bleeding, call our office.  9. Take Tylenol or ibuprofen first for pain if you are able to take these medications and only use narcotic pain medication for severe pain not relieved by the Tylenol or Ibuprofen.  Monitor your Tylenol intake to a max of 4,000 mg in a 24 hour period. You can alternate these medications after surgery.  Diet: 1. Low sodium Heart Healthy Diet is recommended but you are cleared to resume your normal (before surgery) diet after your procedure.  2. It is safe to use a laxative, such as Miralax or Colace, if you have difficulty moving your bowels. You have been prescribed Sennakot-S to take at bedtime every evening after surgery to keep bowel movements regular and to prevent constipation.    Wound Care: 1. Keep  clean and dry.  Shower daily.  Reasons to call the Doctor: Fever - Oral temperature greater than 100.4 degrees Fahrenheit Foul-smelling vaginal discharge Difficulty urinating Nausea and vomiting Increased pain at the site of the incision that is unrelieved with pain medicine. Difficulty breathing with or without chest pain New calf pain especially if only on one side  Sudden, continuing increased vaginal bleeding with or without clots.   Contacts: For questions or concerns you should contact:  Dr. Jeral Pinch at [phone]  Joylene John, NP at [phone]  After Hours: call [phone] and have the GYN Oncologist paged/contacted (after 5 pm or on the weekends).  Messages sent via mychart are for non-urgent matters and are not responded to after hours so for urgent needs, please call the after hours number.

## 2022-06-25 ENCOUNTER — Telehealth: Payer: Self-pay

## 2022-06-25 NOTE — Telephone Encounter (Signed)
Telephone call to check on pre-operative status.  Patient compliant with pre-operative instructions.  Reinforced nothing to eat after midnight. Clear liquids until 0730. Patient to arrive at 0830.  No questions or concerns voiced.  Instructed to call for any needs.  Pt aware medications for morning of procedure are: Tylenol, Inhalers, Xanax, Amlodipine, Omeprazole, Effexor. She voiced an understanding

## 2022-06-25 NOTE — Progress Notes (Signed)
Patient here for follow up with Dr. Jeral Pinch and for a pre-operative appointment prior to her scheduled surgery on June 26, 2022. She is scheduled for robotic assisted laparoscopic total hysterectomy, bilateral salpingo-oophorectomy, possible staging if a precancer or cancer is identified. She had her pre-admission testing appointment on 11/21 at [facility].  The surgery was discussed in detail.  See after visit summary for additional details. Visual aids used to discuss items related to surgery including the incentive spirometer, sequential compression stockings, foley catheter, IV pump, multi-modal pain regimen including tylenol, photo of the surgical robot, female reproductive system to discuss surgery in detail.      Discussed post-op pain management in detail including the aspects of the enhanced recovery pathway.  Advised her that a new prescription would be sent in for tramadol and it is only to be used for after her upcoming surgery.  We discussed the use of tylenol post-op and to monitor for a maximum of 4,000 mg in a 24 hour period.  Also prescribed sennakot to be used after surgery and to hold if having loose stools.  Discussed bowel regimen in detail.     Discussed the use of SCDs and measures to take at home to prevent DVT including frequent mobility.  Reportable signs and symptoms of DVT discussed. Post-operative instructions discussed and expectations for after surgery. Incisional care discussed as well including reportable signs and symptoms including erythema, drainage, wound separation.     5 minutes spent with the patient.  Verbalizing understanding of material discussed. No needs or concerns voiced at the end of the visit.   Advised patient to call for any needs.  Advised that her post-operative medications had been prescribed and could be picked up at any time. She is aware of pre-op and post-op instructions for tamoxifen use per Dr. Geralyn Flash last note.  This appointment is  included in the global surgical bundle as pre-operative teaching and has no charge.

## 2022-06-26 ENCOUNTER — Encounter (HOSPITAL_COMMUNITY)
Admission: RE | Disposition: A | Discharge status: Home / Self Care | Payer: Self-pay | Source: Ambulatory Visit | Attending: Gynecologic Oncology

## 2022-06-26 ENCOUNTER — Other Ambulatory Visit: Payer: Self-pay

## 2022-06-26 ENCOUNTER — Ambulatory Visit (HOSPITAL_COMMUNITY): Payer: Medicare PPO | Admitting: Physician Assistant

## 2022-06-26 ENCOUNTER — Encounter (HOSPITAL_COMMUNITY): Payer: Self-pay | Admitting: Gynecologic Oncology

## 2022-06-26 ENCOUNTER — Ambulatory Visit (HOSPITAL_COMMUNITY)
Admission: RE | Admit: 2022-06-26 | Discharge: 2022-06-26 | Disposition: A | Discharge status: Home / Self Care | Payer: Medicare PPO | Source: Ambulatory Visit | Attending: Gynecologic Oncology | Admitting: Gynecologic Oncology

## 2022-06-26 ENCOUNTER — Ambulatory Visit (HOSPITAL_BASED_OUTPATIENT_CLINIC_OR_DEPARTMENT_OTHER): Payer: Medicare PPO | Admitting: Certified Registered Nurse Anesthetist

## 2022-06-26 DIAGNOSIS — N858 Other specified noninflammatory disorders of uterus: Secondary | ICD-10-CM | POA: Diagnosis not present

## 2022-06-26 DIAGNOSIS — I10 Essential (primary) hypertension: Secondary | ICD-10-CM | POA: Diagnosis not present

## 2022-06-26 DIAGNOSIS — D259 Leiomyoma of uterus, unspecified: Secondary | ICD-10-CM | POA: Insufficient documentation

## 2022-06-26 DIAGNOSIS — N8003 Adenomyosis of the uterus: Secondary | ICD-10-CM | POA: Diagnosis not present

## 2022-06-26 DIAGNOSIS — J449 Chronic obstructive pulmonary disease, unspecified: Secondary | ICD-10-CM | POA: Insufficient documentation

## 2022-06-26 DIAGNOSIS — R9389 Abnormal findings on diagnostic imaging of other specified body structures: Secondary | ICD-10-CM | POA: Diagnosis present

## 2022-06-26 DIAGNOSIS — N736 Female pelvic peritoneal adhesions (postinfective): Secondary | ICD-10-CM

## 2022-06-26 DIAGNOSIS — Z87891 Personal history of nicotine dependence: Secondary | ICD-10-CM | POA: Insufficient documentation

## 2022-06-26 DIAGNOSIS — N8000 Endometriosis of the uterus, unspecified: Secondary | ICD-10-CM | POA: Diagnosis not present

## 2022-06-26 DIAGNOSIS — I2510 Atherosclerotic heart disease of native coronary artery without angina pectoris: Secondary | ICD-10-CM

## 2022-06-26 DIAGNOSIS — K660 Peritoneal adhesions (postprocedural) (postinfection): Secondary | ICD-10-CM | POA: Diagnosis not present

## 2022-06-26 DIAGNOSIS — Z01818 Encounter for other preprocedural examination: Secondary | ICD-10-CM

## 2022-06-26 DIAGNOSIS — Z170 Estrogen receptor positive status [ER+]: Secondary | ICD-10-CM | POA: Insufficient documentation

## 2022-06-26 DIAGNOSIS — Z79810 Long term (current) use of selective estrogen receptor modulators (SERMs): Secondary | ICD-10-CM | POA: Diagnosis not present

## 2022-06-26 DIAGNOSIS — C50412 Malignant neoplasm of upper-outer quadrant of left female breast: Secondary | ICD-10-CM | POA: Diagnosis not present

## 2022-06-26 DIAGNOSIS — N950 Postmenopausal bleeding: Secondary | ICD-10-CM | POA: Diagnosis not present

## 2022-06-26 HISTORY — PX: ROBOTIC ASSISTED LAPAROSCOPIC LYSIS OF ADHESION: SHX6080

## 2022-06-26 HISTORY — PX: ROBOTIC ASSISTED TOTAL HYSTERECTOMY WITH BILATERAL SALPINGO OOPHERECTOMY: SHX6086

## 2022-06-26 LAB — TYPE AND SCREEN
ABO/RH(D): B POS
Antibody Screen: NEGATIVE

## 2022-06-26 LAB — ABO/RH: ABO/RH(D): B POS

## 2022-06-26 SURGERY — HYSTERECTOMY, TOTAL, ROBOT-ASSISTED, LAPAROSCOPIC, WITH BILATERAL SALPINGO-OOPHORECTOMY
Anesthesia: General

## 2022-06-26 MED ORDER — HEPARIN SODIUM (PORCINE) 5000 UNIT/ML IJ SOLN
5000.0000 [IU] | INTRAMUSCULAR | Status: AC
  Administered 2022-06-26: 5000 [IU] via SUBCUTANEOUS
  Filled 2022-06-26: qty 1

## 2022-06-26 MED ORDER — HYDROMORPHONE HCL 1 MG/ML IJ SOLN
INTRAMUSCULAR | Status: DC | PRN
  Administered 2022-06-26 (×4): .5 mg via INTRAVENOUS

## 2022-06-26 MED ORDER — BUPIVACAINE LIPOSOME 1.3 % IJ SUSP
INTRAMUSCULAR | Status: DC | PRN
  Administered 2022-06-26: 20 mL

## 2022-06-26 MED ORDER — ROCURONIUM BROMIDE 10 MG/ML (PF) SYRINGE
PREFILLED_SYRINGE | INTRAVENOUS | Status: DC | PRN
  Administered 2022-06-26: 70 mg via INTRAVENOUS
  Administered 2022-06-26: 20 mg via INTRAVENOUS
  Administered 2022-06-26: 10 mg via INTRAVENOUS
  Administered 2022-06-26: 20 mg via INTRAVENOUS

## 2022-06-26 MED ORDER — ONDANSETRON HCL 4 MG/2ML IJ SOLN
INTRAMUSCULAR | Status: DC | PRN
  Administered 2022-06-26: 4 mg via INTRAVENOUS

## 2022-06-26 MED ORDER — PROPOFOL 10 MG/ML IV BOLUS
INTRAVENOUS | Status: DC | PRN
  Administered 2022-06-26: 140 mg via INTRAVENOUS
  Administered 2022-06-26: 40 mg via INTRAVENOUS

## 2022-06-26 MED ORDER — LIDOCAINE HCL (PF) 2 % IJ SOLN
INTRAMUSCULAR | Status: AC
  Filled 2022-06-26: qty 5

## 2022-06-26 MED ORDER — DEXAMETHASONE SODIUM PHOSPHATE 10 MG/ML IJ SOLN
INTRAMUSCULAR | Status: DC | PRN
  Administered 2022-06-26: 10 mg via INTRAVENOUS

## 2022-06-26 MED ORDER — ONDANSETRON HCL 4 MG/2ML IJ SOLN
INTRAMUSCULAR | Status: AC
  Filled 2022-06-26: qty 2

## 2022-06-26 MED ORDER — ROCURONIUM BROMIDE 10 MG/ML (PF) SYRINGE
PREFILLED_SYRINGE | INTRAVENOUS | Status: AC
  Filled 2022-06-26: qty 10

## 2022-06-26 MED ORDER — FENTANYL CITRATE (PF) 100 MCG/2ML IJ SOLN
INTRAMUSCULAR | Status: AC
  Filled 2022-06-26: qty 2

## 2022-06-26 MED ORDER — TAMOXIFEN CITRATE 20 MG PO TABS: 20.0000 mg | ORAL_TABLET | Freq: Every day | ORAL | 3 refills | Status: DC

## 2022-06-26 MED ORDER — ACETAMINOPHEN 500 MG PO TABS
1000.0000 mg | ORAL_TABLET | ORAL | Status: AC
  Administered 2022-06-26: 1000 mg via ORAL
  Filled 2022-06-26: qty 2

## 2022-06-26 MED ORDER — LACTATED RINGERS IV SOLN: INTRAVENOUS | Status: DC

## 2022-06-26 MED ORDER — DEXAMETHASONE SODIUM PHOSPHATE 4 MG/ML IJ SOLN: 4.0000 mg | INTRAMUSCULAR | Status: DC

## 2022-06-26 MED ORDER — HYDROMORPHONE HCL 1 MG/ML IJ SOLN: 0.2500 mg | INTRAMUSCULAR | Status: DC | PRN

## 2022-06-26 MED ORDER — LACTATED RINGERS IV SOLN: INTRAVENOUS | Status: DC | PRN

## 2022-06-26 MED ORDER — CHLORHEXIDINE GLUCONATE 0.12 % MT SOLN
15.0000 mL | Freq: Once | OROMUCOSAL | Status: AC
  Administered 2022-06-26: 15 mL via OROMUCOSAL

## 2022-06-26 MED ORDER — BUPIVACAINE HCL 0.25 % IJ SOLN
INTRAMUSCULAR | Status: DC | PRN
  Administered 2022-06-26: 30 mL

## 2022-06-26 MED ORDER — BUPIVACAINE LIPOSOME 1.3 % IJ SUSP
INTRAMUSCULAR | Status: AC
  Filled 2022-06-26: qty 20

## 2022-06-26 MED ORDER — SUGAMMADEX SODIUM 200 MG/2ML IV SOLN
INTRAVENOUS | Status: DC | PRN
  Administered 2022-06-26: 150 mg via INTRAVENOUS

## 2022-06-26 MED ORDER — LIDOCAINE 2% (20 MG/ML) 5 ML SYRINGE
INTRAMUSCULAR | Status: DC | PRN
  Administered 2022-06-26: 100 mg via INTRAVENOUS

## 2022-06-26 MED ORDER — BUPIVACAINE HCL 0.25 % IJ SOLN
INTRAMUSCULAR | Status: AC
  Filled 2022-06-26: qty 1

## 2022-06-26 MED ORDER — OXYCODONE HCL 5 MG/5ML PO SOLN: 5.0000 mg | Freq: Once | ORAL | Status: DC | PRN

## 2022-06-26 MED ORDER — AMISULPRIDE (ANTIEMETIC) 5 MG/2ML IV SOLN: 10.0000 mg | Freq: Once | INTRAVENOUS | Status: DC | PRN

## 2022-06-26 MED ORDER — FENTANYL CITRATE (PF) 250 MCG/5ML IJ SOLN
INTRAMUSCULAR | Status: AC
  Filled 2022-06-26: qty 5

## 2022-06-26 MED ORDER — CEFAZOLIN SODIUM-DEXTROSE 2-4 GM/100ML-% IV SOLN
2.0000 g | INTRAVENOUS | Status: AC
  Administered 2022-06-26: 2 g via INTRAVENOUS
  Filled 2022-06-26: qty 100

## 2022-06-26 MED ORDER — LACTATED RINGERS IR SOLN
Status: DC | PRN
  Administered 2022-06-26: 1000 mL

## 2022-06-26 MED ORDER — ORAL CARE MOUTH RINSE: 15.0000 mL | Freq: Once | OROMUCOSAL | Status: AC

## 2022-06-26 MED ORDER — FENTANYL CITRATE (PF) 250 MCG/5ML IJ SOLN
INTRAMUSCULAR | Status: DC | PRN
  Administered 2022-06-26 (×7): 50 ug via INTRAVENOUS

## 2022-06-26 MED ORDER — HYDROMORPHONE HCL 2 MG/ML IJ SOLN
INTRAMUSCULAR | Status: AC
  Filled 2022-06-26: qty 1

## 2022-06-26 MED ORDER — PROMETHAZINE HCL 25 MG/ML IJ SOLN: 6.2500 mg | INTRAMUSCULAR | Status: DC | PRN

## 2022-06-26 MED ORDER — DEXAMETHASONE SODIUM PHOSPHATE 10 MG/ML IJ SOLN
INTRAMUSCULAR | Status: AC
  Filled 2022-06-26: qty 1

## 2022-06-26 MED ORDER — OXYCODONE HCL 5 MG PO TABS: 5.0000 mg | ORAL_TABLET | Freq: Once | ORAL | Status: DC | PRN

## 2022-06-26 SURGICAL SUPPLY — 83 items
ADH SKN CLS APL DERMABOND .7 (GAUZE/BANDAGES/DRESSINGS) ×1
ADH SKN CLS LQ APL DERMABOND (GAUZE/BANDAGES/DRESSINGS) ×1
AGENT HMST KT MTR STRL THRMB (HEMOSTASIS)
APL ESCP 34 STRL LF DISP (HEMOSTASIS)
APL SRG 38 LTWT LNG FL B (MISCELLANEOUS) ×1
APPLICATOR ARISTA FLEXITIP XL (MISCELLANEOUS) IMPLANT
APPLICATOR SURGIFLO ENDO (HEMOSTASIS) IMPLANT
BAG LAPAROSCOPIC 12 15 PORT 16 (BASKET) IMPLANT
BAG RETRIEVAL 12/15 (BASKET) ×2
BLADE SURG SZ10 CARB STEEL (BLADE) IMPLANT
COVER BACK TABLE 60X90IN (DRAPES) ×1 IMPLANT
COVER TIP SHEARS 8 DVNC (MISCELLANEOUS) ×1 IMPLANT
COVER TIP SHEARS 8MM DA VINCI (MISCELLANEOUS) ×1
DERMABOND ADVANCED .7 DNX12 (GAUZE/BANDAGES/DRESSINGS) ×1 IMPLANT
DERMABOND ADVANCED .7 DNX6 (GAUZE/BANDAGES/DRESSINGS) IMPLANT
DRAPE ARM DVNC X/XI (DISPOSABLE) ×4 IMPLANT
DRAPE COLUMN DVNC XI (DISPOSABLE) ×1 IMPLANT
DRAPE DA VINCI XI ARM (DISPOSABLE) ×4
DRAPE DA VINCI XI COLUMN (DISPOSABLE) ×1
DRAPE SHEET LG 3/4 BI-LAMINATE (DRAPES) ×1 IMPLANT
DRAPE SURG IRRIG POUCH 19X23 (DRAPES) ×1 IMPLANT
DRSG OPSITE POSTOP 4X6 (GAUZE/BANDAGES/DRESSINGS) IMPLANT
DRSG OPSITE POSTOP 4X8 (GAUZE/BANDAGES/DRESSINGS) IMPLANT
ELECT PENCIL ROCKER SW 15FT (MISCELLANEOUS) IMPLANT
ELECT REM PT RETURN 15FT ADLT (MISCELLANEOUS) ×1 IMPLANT
GAUZE 4X4 16PLY ~~LOC~~+RFID DBL (SPONGE) ×2 IMPLANT
GLOVE BIO SURGEON STRL SZ 6 (GLOVE) ×4 IMPLANT
GLOVE BIO SURGEON STRL SZ 6.5 (GLOVE) ×1 IMPLANT
GOWN STRL REUS W/ TWL LRG LVL3 (GOWN DISPOSABLE) ×4 IMPLANT
GOWN STRL REUS W/TWL LRG LVL3 (GOWN DISPOSABLE) ×4
GRASPER SUT TROCAR 14GX15 (MISCELLANEOUS) IMPLANT
HEMOSTAT ARISTA ABSORB 3G PWDR (HEMOSTASIS) IMPLANT
HOLDER FOLEY CATH W/STRAP (MISCELLANEOUS) IMPLANT
IRRIG SUCT STRYKERFLOW 2 WTIP (MISCELLANEOUS) ×1
IRRIGATION SUCT STRKRFLW 2 WTP (MISCELLANEOUS) ×1 IMPLANT
KIT PROCEDURE DA VINCI SI (MISCELLANEOUS)
KIT PROCEDURE DVNC SI (MISCELLANEOUS) IMPLANT
KIT TURNOVER KIT A (KITS) IMPLANT
LIGASURE IMPACT 36 18CM CVD LR (INSTRUMENTS) IMPLANT
MANIPULATOR ADVINCU DEL 3.0 PL (MISCELLANEOUS) IMPLANT
MANIPULATOR ADVINCU DEL 3.5 PL (MISCELLANEOUS) IMPLANT
MANIPULATOR UTERINE 4.5 ZUMI (MISCELLANEOUS) IMPLANT
NDL HYPO 21X1.5 SAFETY (NEEDLE) ×1 IMPLANT
NDL SPNL 18GX3.5 QUINCKE PK (NEEDLE) IMPLANT
NEEDLE HYPO 21X1.5 SAFETY (NEEDLE) ×1 IMPLANT
NEEDLE SPNL 18GX3.5 QUINCKE PK (NEEDLE) IMPLANT
OBTURATOR OPTICAL STANDARD 8MM (TROCAR) ×1
OBTURATOR OPTICAL STND 8 DVNC (TROCAR) ×1
OBTURATOR OPTICALSTD 8 DVNC (TROCAR) ×1 IMPLANT
PACK ROBOT GYN CUSTOM WL (TRAY / TRAY PROCEDURE) ×1 IMPLANT
PAD POSITIONING PINK XL (MISCELLANEOUS) ×1 IMPLANT
PORT ACCESS TROCAR AIRSEAL 12 (TROCAR) ×1 IMPLANT
SEAL CANN UNIV 5-8 DVNC XI (MISCELLANEOUS) ×4 IMPLANT
SEAL XI 5MM-8MM UNIVERSAL (MISCELLANEOUS) ×4
SET TRI-LUMEN FLTR TB AIRSEAL (TUBING) ×1 IMPLANT
SOL PREP POV-IOD 4OZ 10% (MISCELLANEOUS) ×2 IMPLANT
SPIKE FLUID TRANSFER (MISCELLANEOUS) ×1 IMPLANT
SPONGE T-LAP 18X18 ~~LOC~~+RFID (SPONGE) IMPLANT
SURGIFLO W/THROMBIN 8M KIT (HEMOSTASIS) IMPLANT
SUT MNCRL AB 4-0 PS2 18 (SUTURE) IMPLANT
SUT PDS AB 1 TP1 96 (SUTURE) IMPLANT
SUT VIC AB 0 CT1 27 (SUTURE)
SUT VIC AB 0 CT1 27XBRD ANTBC (SUTURE) IMPLANT
SUT VIC AB 2-0 CT1 27 (SUTURE)
SUT VIC AB 2-0 CT1 TAPERPNT 27 (SUTURE) IMPLANT
SUT VIC AB 2-0 SH 27 (SUTURE) ×2
SUT VIC AB 2-0 SH 27X BRD (SUTURE) IMPLANT
SUT VIC AB 4-0 PS2 18 (SUTURE) ×2 IMPLANT
SUT VICRYL 0 UR6 27IN ABS (SUTURE) IMPLANT
SUT VLOC 180 0 9IN  GS21 (SUTURE) ×1
SUT VLOC 180 0 9IN GS21 (SUTURE) IMPLANT
SYR 10ML LL (SYRINGE) IMPLANT
SYR BULB IRRIG 60ML STRL (SYRINGE) IMPLANT
SYS BAG RETRIEVAL 10MM (BASKET)
SYS WOUND ALEXIS 18CM MED (MISCELLANEOUS) ×1
SYSTEM BAG RETRIEVAL 10MM (BASKET) IMPLANT
SYSTEM WOUND ALEXIS 18CM MED (MISCELLANEOUS) IMPLANT
TOWEL OR NON WOVEN STRL DISP B (DISPOSABLE) IMPLANT
TRAP SPECIMEN MUCUS 40CC (MISCELLANEOUS) IMPLANT
TRAY FOLEY MTR SLVR 16FR STAT (SET/KITS/TRAYS/PACK) ×1 IMPLANT
UNDERPAD 30X36 HEAVY ABSORB (UNDERPADS AND DIAPERS) ×2 IMPLANT
WATER STERILE IRR 1000ML POUR (IV SOLUTION) ×1 IMPLANT
YANKAUER SUCT BULB TIP 10FT TU (MISCELLANEOUS) IMPLANT

## 2022-06-26 NOTE — Anesthesia Postprocedure Evaluation (Signed)
Anesthesia Post Note  Patient: Sabrina Tapia  Procedure(s) Performed: XI ROBOTIC ASSISTED TOTAL HYSTERECTOMY WITH BILATERAL SALPINGO OOPHORECTOMY, MINI LAPAROTOMY XI ROBOTIC ASSISTED LAPAROSCOPIC LYSIS OF ADHESION     Patient location during evaluation: PACU Anesthesia Type: General Level of consciousness: awake and alert Pain management: pain level controlled Vital Signs Assessment: post-procedure vital signs reviewed and stable Respiratory status: spontaneous breathing, nonlabored ventilation and respiratory function stable Cardiovascular status: blood pressure returned to baseline and stable Postop Assessment: no apparent nausea or vomiting Anesthetic complications: no   No notable events documented.  Last Vitals:  Vitals:   06/26/22 1545 06/26/22 1600  BP: 109/72 113/67  Pulse: 79 78  Resp: 13 13  Temp:    SpO2: 92% 95%    Last Pain:  Vitals:   06/26/22 1530  TempSrc:   PainSc: 0-No pain                 Lynda Rainwater

## 2022-06-26 NOTE — Op Note (Signed)
OPERATIVE NOTE  Pre-operative Diagnosis: PMB, non diagnostic endometrial sampling  Post-operative Diagnosis: same, no endometrial malignancy seen on frozen section, significant pelvic adhesions  Operation: Robotic-assisted laparoscopic total hysterectomy with bilateral salpingo-oophorectomy, enterolysis for approximately 60 minutes, mini laparotomy for specimen removal, repair of vaginal laceration  Surgeon: Jeral Pinch MD  Assistant Surgeon: Joylene John NP  Anesthesia: GET  Urine Output: 400 cc  Operative Findings:  : On EUA, narrow introitus, mobile uterus.  On intra-abdominal entry, normal upper abdominal survey.  Normal-appearing omentum, small and large bowel.  Uterus approximately 8 cm with multiple 1-2 intramural fibroids at the fundus.  Atrophic and normal-appearing bilateral adnexa.  Significant adhesions of the rectum to the posterior lower uterine minute and cervix.  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  Given dense adhesions, suspect prior history of endometriosis, pelvic inflammatory disease, or posterior uterine perforation during prior 2020 surgery.  Been narrow vagina, specimen was delivered through a mini lap.   Given dissection required to mobilize the rectum from the posterior uterus and cervix, I asked Dr. Marcello Moores for intraoperative assistance.  At the end of surgery, bubble test was performed which was negative. On frozen section, no endometrial mass or malignancy noted.  Estimated Blood Loss:  50 cc      Total IV Fluids: see I&O flowsheet         Specimens: uterus, cervix, bilateral tubes and ovaries         Complications:  None apparent; patient tolerated the procedure well.         Disposition: PACU - hemodynamically stable.  Procedure Details  The patient was seen in the Holding Room. The risks, benefits, complications, treatment options, and expected outcomes were discussed with the patient.  The patient concurred with the proposed plan, giving  informed consent.  The site of surgery properly noted/marked. The patient was identified as Sabrina Tapia and the procedure verified as a Robotic-assisted hysterectomy with bilateral salpingo oophorectomy, possible staging.   After induction of anesthesia, the patient was draped and prepped in the usual sterile manner. Patient was placed in supine position after anesthesia and draped and prepped in the usual sterile manner as follows: Her arms were tucked to her side with all appropriate precautions.  The patient was secured to the bed using padding and tape across her chest.  The patient was placed in the semi-lithotomy position in [place].  The perineum and vagina were prepped with Betadine. The patient's abdomen was prepped with ChloraPrep and then she was draped after the prep had been allowed to dry for 3 minutes.  A Time Out was held and the above information confirmed.  The urethra was prepped with Betadine. Foley catheter was placed.  A sterile speculum was placed in the vagina.  The cervix was grasped with a single-tooth tenaculum. The cervix was dilated with Kennon Rounds dilators.  The ZUMI uterine manipulator a colpotomizer ring was placed given very narrow vaginal introitus.  A small episiotomy was even cut but this did not facilitate placement of the KOH ring.  A pneum occluder balloon was placed over the manipulator.  OG tube placement was confirmed and to suction.   Next, a 10 mm skin incision was made 1 cm below the subcostal margin in the midclavicular line.  The 5 mm Optiview port and scope was used for direct entry.  Opening pressure was under 10 mm CO2.  The abdomen was insufflated and the findings were noted as above.   At this point and all  points during the procedure, the patient's intra-abdominal pressure did not exceed 15 mmHg. Next, an 8 mm skin incision was made superior to the umbilicus and a right and left port were placed about 8 cm lateral to the robot port on the right and left  side.  A fourth arm was placed on the right.  The 5 mm assist trocar was exchanged for a 10-12 mm port. All ports were placed under direct visualization.  The patient was placed in steep Trendelenburg.  Bowel was folded away into the upper abdomen.  The robot was docked in the normal manner.  The right and left peritoneum were opened parallel to the IP ligament to open the retroperitoneal spaces bilaterally. The round ligaments were transected. The ureter was noted to be on the medial leaf of the broad ligament.  The peritoneum above the ureter was incised and stretched and the infundibulopelvic ligament was skeletonized, cauterized and cut.    At this point, attention was turned posteriorly, an attempt was made to mobilize the rectum from the posterior uterus using sharp and blunt dissection, erring on the side of the uterus given difficulty in identifying this plane.  Given challenging dissection, I called Dr. Marcello Moores for her assistance.  In the meantime, I opened up the pararectal space bilaterally.  Some fibrosis was noted within the left retroperitoneum.   Attention was then turned anteriorly.  The anterior peritoneum was taken down.  At this point, an EEA sizer was placed in the vagina to help facilitate visualization of the cervicovaginal junction.  The bladder flap was taken down to the EEA sizer.  The uterine vessels were skeletonized bilaterally.  This point, Dr. Marcello Moores was available.  She helped assist by placing an EEA sizer in the rectum and mobilizing the rectum inferiorly.  With her help, I continued the posterior dissection of the rectum from the lower uterus and cervix with sharp dissection.  Once mobilized below the level of the cervix, the rectum was again inspected with no obvious serosal injury.  Monopolar electrocautery was not used during any of the dissection to mobilize the rectum.  Dr. Marcello Moores also palpated the rectum which felt intact.  Posteriorly, the tissue was somewhat fibrous,  which I suspect was because my plane was within the uterus to avoid injury to the rectum.  Uterine arteries were cauterized and cut bilaterally.  At this point, the vaginal manipulator was removed and using the EEA sizer, a colpotomy was made circumferentially and the uterus, cervix, bilateral ovaries and tubes were amputated and placed in a 15 mm Endo Catch bag inserted through the assist trocar.  Pedicles were inspected with excellent hemostasis achieved.  The colpotomy at the vaginal cuff was closed with 0 Vicryl with a figure-of-eight at each apex and 0 V-Loc to close the midline.  Irrigation was used with excellent hemostasis achieved.  At this time, the monopolar scissors were removed and the patient was placed in 15 degrees of Trendelenburg.  Pressure was placed on the superior aspect of the sigmoid and the pelvis filled with fluid.  A bubble test was performed with no bubbles noted.    All of the robotic trocars were removed direct visualization.  The robot was undocked and the patient flattened completely.  With the abdomen still insufflated, the supraumbilical incision was extended to approximately 6 cm scalpel.  The incision was carried down to and through the fascia with monopolar electrocautery.  Peritoneal patient was extended.  An Alexis retractor was placed in the  incision.  The Endo Catch bag containing the uterine specimen was removed through the laparotomy incision and handed off the field sent for frozen section.  Laparoscopic cap was placed over the Alexis retractor and the abdomen was insufflated again.  The patient was placed back in steep Trendelenburg and the pelvis inspected again.  Good hemostasis was noted.  Arista was placed over the rectum and rectal mesentery given degree of sharp dissection with some oozing during this portion of the procedure.  This time, the frozen section returned as benign.  All instruments were removed from the abdomen.  The fascia at the 10-12 port was  closed with 0 Vicryl using a PMI fascial closure device under direct visualization.  The Ubaldo Glassing was removed.  The fascia at the minilaparotomy site was closed with running #1 looped PDS tied in the midline. The subcutaneous tissue was irrigated and hemostasis achieved. Exparel was injected for local anesthesia. The subcutaneous tissue was closed with 2-0 Vicyrl in running fashion.   The subcuticular tissue of all incisions was closed with 4-0 Vicryl and the skin was closed with 4-0 Monocryl in a subcuticular manner.  Dermabond was applied.    The vagina was then swabbed.  Some bleeding was noted from the episiotomy that had been performed.  2-0 Vicryl was used in running fashion to create hemostasis.  The vagina was swabbed again with minimal blood noted. Foley catheter was removed.  All sponge, lap and needle counts were correct x  3.   The patient was transferred to the recovery room in stable condition.  Jeral Pinch, MD

## 2022-06-26 NOTE — Anesthesia Procedure Notes (Signed)
Procedure Name: Intubation Date/Time: 06/26/2022 11:26 AM  Performed by: Sharlette Dense, CRNAPre-anesthesia Checklist: Patient identified, Emergency Drugs available, Suction available and Patient being monitored Patient Re-evaluated:Patient Re-evaluated prior to induction Oxygen Delivery Method: Circle system utilized Preoxygenation: Pre-oxygenation with 100% oxygen Induction Type: IV induction Ventilation: Mask ventilation without difficulty Laryngoscope Size: Miller and 2 Grade View: Grade I Tube type: Oral Tube size: 7.0 mm Number of attempts: 1 Airway Equipment and Method: Stylet Placement Confirmation: ETT inserted through vocal cords under direct vision, positive ETCO2 and breath sounds checked- equal and bilateral Secured at: 22 cm Tube secured with: Tape Dental Injury: Teeth and Oropharynx as per pre-operative assessment

## 2022-06-26 NOTE — Transfer of Care (Signed)
Immediate Anesthesia Transfer of Care Note  Patient: Karynn Jose Persia  Procedure(s) Performed: XI ROBOTIC ASSISTED TOTAL HYSTERECTOMY WITH BILATERAL SALPINGO OOPHORECTOMY, MINI LAPAROTOMY XI ROBOTIC ASSISTED LAPAROSCOPIC LYSIS OF ADHESION  Patient Location: PACU  Anesthesia Type:General  Level of Consciousness: awake and alert   Airway & Oxygen Therapy: Patient Spontanous Breathing and Patient connected to face mask oxygen  Post-op Assessment: Report given to RN and Post -op Vital signs reviewed and stable  Post vital signs: Reviewed and stable  Last Vitals:  Vitals Value Taken Time  BP    Temp    Pulse    Resp    SpO2      Last Pain:  Vitals:   06/26/22 0920  TempSrc:   PainSc: 0-No pain         Complications: No notable events documented.

## 2022-06-26 NOTE — Interval H&P Note (Signed)
History and Physical Interval Note:  06/26/2022 8:39 AM  Sabrina Tapia  has presented today for surgery, with the diagnosis of thickened endometrium, tamoxifen.  The various methods of treatment have been discussed with the patient and family. After consideration of risks, benefits and other options for treatment, the patient has consented to  Procedure(s): XI ROBOTIC ASSISTED TOTAL HYSTERECTOMY WITH BILATERAL SALPINGO OOPHORECTOMY, POSSIBLE STAGING (N/A) as a surgical intervention.  The patient's history has been reviewed, patient examined, no change in status, stable for surgery.  I have reviewed the patient's chart and labs.  Questions were answered to the patient's satisfaction.     Lafonda Mosses

## 2022-06-26 NOTE — Brief Op Note (Signed)
06/26/2022  2:58 PM  PATIENT:  Sabrina Tapia  71 y.o. female  PRE-OPERATIVE DIAGNOSIS:  thickened endometrium on tamoxifen  POST-OPERATIVE DIAGNOSIS:  thickened endometrium on tamoxifen  PROCEDURE:  Procedure(s): XI ROBOTIC ASSISTED TOTAL HYSTERECTOMY WITH BILATERAL SALPINGO OOPHORECTOMY, MINI LAPAROTOMY (N/A) XI ROBOTIC ASSISTED LAPAROSCOPIC LYSIS OF ADHESION (N/A)  SURGEON:  Surgeon(s) and Role:    Lafonda Mosses, MD - Primary    * Leighton Ruff, MD - Assisting  ASSISTANTS: Joylene John NP   ANESTHESIA:  general  EBL:  50 mL   BLOOD ADMINISTERED:none  DRAINS: none   LOCAL MEDICATIONS USED:  MARCAINE, Exparel     SPECIMEN:  uterus, cervix, tubes and ovaries, pelvic washings  DISPOSITION OF SPECIMEN:  PATHOLOGY  COUNTS:  YES  TOURNIQUET:  * No tourniquets in log *  DICTATION: .Note written in EPIC  PLAN OF CARE: Discharge to home after PACU  PATIENT DISPOSITION:  PACU - hemodynamically stable.   Delay start of Pharmacological VTE agent (>24hrs) due to surgical blood loss or risk of bleeding: not applicable

## 2022-06-26 NOTE — Discharge Instructions (Addendum)
AFTER SURGERY INSTRUCTIONS   Return to work: 4-6 weeks if applicable  At the time of surgery, your rectum was stuck to the back side of your uterus. Dr. Berline Lopes was able to separate these but you will need to be on an aggressive bowel regimen with your stools on the loose/soft side. You will need to call the office for any new symptoms such as fever, chills, feeling poorly, increased abdominal pain/tenderness. On frozen section, the pathologist did not feel there was a cancer or precancer if the uterus. We will wait for final path to confirm this.   Activity: 1. Be up and out of the bed during the day.  Take a nap if needed.  You may walk up steps but be careful and use the hand rail.  Stair climbing will tire you more than you think, you may need to stop part way and rest.    2. No lifting or straining for 6 weeks over 10 pounds. No pushing, pulling, straining for 6 weeks.   3. No driving for around 1 week(s).  Do not drive if you are taking narcotic pain medicine and make sure that your reaction time has returned.    4. You can shower as soon as the next day after surgery. Shower daily.  Use your regular soap and water (not directly on the incision) and pat your incision(s) dry afterwards; don't rub.  No tub baths or submerging your body in water until cleared by your surgeon. If you have the soap that was given to you by pre-surgical testing that was used before surgery, you do not need to use it afterwards because this can irritate your incisions.    5. No sexual activity and nothing in the vagina for 8-10 weeks.   6. You may experience a small amount of clear drainage from your incisions, which is normal.  If the drainage persists, increases, or changes color please call the office.   7. Do not use creams, lotions, or ointments such as neosporin on your incisions after surgery until advised by your surgeon because they can cause removal of the dermabond glue on your incisions.     8. You may  experience vaginal spotting after surgery or around the 6-8 week mark from surgery when the stitches at the top of the vagina begin to dissolve.  The spotting is normal but if you experience heavy bleeding, call our office.   9. Take Tylenol or ibuprofen first for pain if you are able to take these medications and only use narcotic pain medication for severe pain not relieved by the Tylenol or Ibuprofen.  Monitor your Tylenol intake to a max of 4,000 mg in a 24 hour period. You can alternate these medications after surgery.   Diet: 1. Low sodium Heart Healthy Diet is recommended but you are cleared to resume your normal (before surgery) diet after your procedure.   2. It is safe to use a laxative, such as Miralax or Colace, if you have difficulty moving your bowels. You have been prescribed Sennakot-S to take at bedtime every evening after surgery to keep bowel movements regular and to prevent constipation.  You will also need to take miralax.   Wound Care: 1. Keep clean and dry.  Shower daily.   Reasons to call the Doctor: Fever - Oral temperature greater than 100.4 degrees Fahrenheit Foul-smelling vaginal discharge Difficulty urinating Nausea and vomiting Increased pain at the site of the incision that is unrelieved with pain medicine. Difficulty  breathing with or without chest pain New calf pain especially if only on one side Sudden, continuing increased vaginal bleeding with or without clots.   Contacts: For questions or concerns you should contact:   Dr. Jeral Pinch at [phone]   Joylene John, NP at [phone]   After Hours: call [phone] and have the GYN Oncologist paged/contacted (after 5 pm or on the weekends).   Messages sent via mychart are for non-urgent matters and are not responded to after hours so for urgent needs, please call the after hours number.

## 2022-06-26 NOTE — Anesthesia Preprocedure Evaluation (Signed)
Anesthesia Evaluation  Patient identified by MRN, date of birth, ID band  Airway Mallampati: II  TM Distance: >3 FB Neck ROM: Full    Dental  (+) Dental Advisory Given   Pulmonary asthma , sleep apnea , COPD, former smoker   breath sounds clear to auscultation       Cardiovascular hypertension, Pt. on medications + CAD   Rhythm:Regular Rate:Normal     Neuro/Psych  Headaches CVA, No Residual Symptoms    GI/Hepatic Neg liver ROS,GERD  ,,  Endo/Other  negative endocrine ROS    Renal/GU negative Renal ROS     Musculoskeletal  (+) Arthritis , Osteoarthritis,    Abdominal   Peds  Hematology negative hematology ROS (+)   Anesthesia Other Findings   Reproductive/Obstetrics                             Lab Results  Component Value Date   WBC 5.9 06/17/2022   HGB 13.4 06/17/2022   HCT 40.7 06/17/2022   MCV 92.5 06/17/2022   PLT 330 06/17/2022   Lab Results  Component Value Date   CREATININE 0.65 06/17/2022   BUN 20 06/17/2022   NA 132 (L) 06/17/2022   K 4.3 06/17/2022   CL 100 06/17/2022   CO2 25 06/17/2022    Anesthesia Physical Anesthesia Plan  ASA: 3  Anesthesia Plan: General   Post-op Pain Management: Tylenol PO (pre-op)*, Dilaudid IV and Toradol IV (intra-op)*   Induction: Intravenous  PONV Risk Score and Plan: 3 and Dexamethasone, Ondansetron, Treatment may vary due to age or medical condition and Midazolam  Airway Management Planned: Oral ETT  Additional Equipment:   Intra-op Plan:   Post-operative Plan: Extubation in OR  Informed Consent: I have reviewed the patients History and Physical, chart, labs and discussed the procedure including the risks, benefits and alternatives for the proposed anesthesia with the patient or authorized representative who has indicated his/her understanding and acceptance.     Dental advisory given  Plan Discussed with:  CRNA  Anesthesia Plan Comments:         Anesthesia Quick Evaluation

## 2022-06-26 NOTE — Op Note (Signed)
06/26/2022  1:26 PM  PATIENT:  Sabrina Tapia  71 y.o. female  Patient Care Team: Kathalene Frames, MD as PCP - General (Internal Medicine) Rolm Bookbinder, MD as Consulting Physician (General Surgery) Magrinat, Virgie Dad, MD (Inactive) as Consulting Physician (Oncology) Delice Bison, Charlestine Massed, NP as Nurse Practitioner (Hematology and Oncology) Yisroel Ramming, Everardo All, MD as Consulting Physician (Obstetrics and Gynecology)  PRE-OPERATIVE DIAGNOSIS:  thickened endometrium on tamoxifen  POST-OPERATIVE DIAGNOSIS:  thickened endometrium on tamoxifen  PROCEDURE:  XI ROBOTIC ASSISTED TOTAL HYSTERECTOMY WITH BILATERAL SALPINGO OOPHORECTOMY, POSSIBLE STAGING    Surgeon(s): Lafonda Mosses, MD Leighton Ruff, MD   DESCRIPTION: I was called to the operating room anterior rectal thickening and help with dissection of the uterus from the rectal wall.  Upon entering the OR the patient was asleep and the robot was docked.  Dr. Berline Lopes was dissecting the uterus for transection of the vagina.  There was some thickened tissue at the peritoneal reflection.  There was concern for possible adherence to the rectum.  I was able to place a small rectal dilator into the rectum and identify the anterior wall.  Dr. Berline Lopes was then able to dissect above this safely.  Upon leaving the operating room, there was no sign of injury to the rectum or thinning of the rectal wall.  The area was palpated internally and felt to be normal.  Case was then turned over to Dr. Berline Lopes for completion.  Rosario Adie, MD  [facility]

## 2022-06-27 ENCOUNTER — Telehealth: Payer: Self-pay | Admitting: Surgery

## 2022-06-27 ENCOUNTER — Encounter (HOSPITAL_COMMUNITY): Payer: Self-pay | Admitting: Gynecologic Oncology

## 2022-06-27 NOTE — Telephone Encounter (Signed)
Spoke with Sabrina Tapia this morning. She states she is eating, drinking and urinating well. She has not had a BM yet but is passing gas. She is taking senokot and miralax as prescribed and encouraged her to drink plenty of water. She denies fever or chills. Incisions are dry and intact. She rates her pain 5/10. Her pain is controlled with Tylenol and Advil.    Instructed to call office with any fever, chills, purulent drainage, uncontrolled pain or any other questions or concerns. Reinforced importance of bowel regimen due to the nature of her surgery (see discharge instructions). Patient verbalizes understanding.   Pt aware of post op appointments as well as the office number [phone] and after hours number [phone] to call if she has any questions or concerns.

## 2022-06-30 LAB — SURGICAL PATHOLOGY

## 2022-07-01 ENCOUNTER — Telehealth: Payer: Self-pay | Admitting: Gynecologic Oncology

## 2022-07-01 NOTE — Telephone Encounter (Signed)
Doing well.  Meeting postoperative milestones.  Began having bowel function 2 and half days after surgery, describes stool as soft.  Reviewed pathology from surgery, all benign.  Patient very happy with this news.  Jeral Pinch MD Gynecologic Oncology

## 2022-07-02 ENCOUNTER — Inpatient Hospital Stay: Payer: Medicare PPO | Admitting: Gynecologic Oncology

## 2022-07-04 ENCOUNTER — Inpatient Hospital Stay: Payer: Medicare PPO | Admitting: Gynecologic Oncology

## 2022-07-11 DIAGNOSIS — G4730 Sleep apnea, unspecified: Secondary | ICD-10-CM | POA: Diagnosis not present

## 2022-07-11 DIAGNOSIS — I10 Essential (primary) hypertension: Secondary | ICD-10-CM | POA: Diagnosis not present

## 2022-07-11 DIAGNOSIS — G4700 Insomnia, unspecified: Secondary | ICD-10-CM | POA: Diagnosis not present

## 2022-07-11 DIAGNOSIS — D0511 Intraductal carcinoma in situ of right breast: Secondary | ICD-10-CM | POA: Diagnosis not present

## 2022-07-11 DIAGNOSIS — Z1331 Encounter for screening for depression: Secondary | ICD-10-CM | POA: Diagnosis not present

## 2022-07-11 DIAGNOSIS — I700 Atherosclerosis of aorta: Secondary | ICD-10-CM | POA: Diagnosis not present

## 2022-07-11 DIAGNOSIS — J309 Allergic rhinitis, unspecified: Secondary | ICD-10-CM | POA: Diagnosis not present

## 2022-07-11 DIAGNOSIS — E559 Vitamin D deficiency, unspecified: Secondary | ICD-10-CM | POA: Diagnosis not present

## 2022-07-11 DIAGNOSIS — E7800 Pure hypercholesterolemia, unspecified: Secondary | ICD-10-CM | POA: Diagnosis not present

## 2022-07-11 DIAGNOSIS — I2510 Atherosclerotic heart disease of native coronary artery without angina pectoris: Secondary | ICD-10-CM | POA: Diagnosis not present

## 2022-07-11 DIAGNOSIS — Z0000 Encounter for general adult medical examination without abnormal findings: Secondary | ICD-10-CM | POA: Diagnosis not present

## 2022-07-14 DIAGNOSIS — Z1231 Encounter for screening mammogram for malignant neoplasm of breast: Secondary | ICD-10-CM | POA: Diagnosis not present

## 2022-07-18 ENCOUNTER — Encounter: Payer: Medicare PPO | Admitting: Gynecologic Oncology

## 2022-07-22 ENCOUNTER — Encounter: Payer: Self-pay | Admitting: Hematology and Oncology

## 2022-07-22 ENCOUNTER — Encounter: Payer: Self-pay | Admitting: Gynecologic Oncology

## 2022-07-22 ENCOUNTER — Encounter: Payer: Medicare PPO | Admitting: Gynecologic Oncology

## 2022-07-23 DIAGNOSIS — R52 Pain, unspecified: Secondary | ICD-10-CM | POA: Diagnosis not present

## 2022-07-23 DIAGNOSIS — M13842 Other specified arthritis, left hand: Secondary | ICD-10-CM | POA: Diagnosis not present

## 2022-07-23 DIAGNOSIS — M79645 Pain in left finger(s): Secondary | ICD-10-CM | POA: Diagnosis not present

## 2022-07-23 DIAGNOSIS — M79644 Pain in right finger(s): Secondary | ICD-10-CM | POA: Diagnosis not present

## 2022-07-25 ENCOUNTER — Encounter: Payer: Self-pay | Admitting: Gynecologic Oncology

## 2022-07-25 ENCOUNTER — Inpatient Hospital Stay: Payer: Medicare PPO | Attending: Gynecologic Oncology | Admitting: Gynecologic Oncology

## 2022-07-25 VITALS — BP 137/81 | HR 66 | Temp 98.7°F | Resp 16 | Ht 64.0 in | Wt 161.1 lb

## 2022-07-25 DIAGNOSIS — Z170 Estrogen receptor positive status [ER+]: Secondary | ICD-10-CM

## 2022-07-25 DIAGNOSIS — C50411 Malignant neoplasm of upper-outer quadrant of right female breast: Secondary | ICD-10-CM

## 2022-07-25 DIAGNOSIS — Z90710 Acquired absence of both cervix and uterus: Secondary | ICD-10-CM

## 2022-07-25 DIAGNOSIS — Z90722 Acquired absence of ovaries, bilateral: Secondary | ICD-10-CM

## 2022-07-25 DIAGNOSIS — R9389 Abnormal findings on diagnostic imaging of other specified body structures: Secondary | ICD-10-CM

## 2022-07-25 NOTE — Patient Instructions (Signed)
It was good to see you today.  You are healing well from surgery.  Remember, no heavy lifting for at least 6 weeks after surgery and nothing in the vagina for 8-10.  Please keep me posted about bladder sensation.

## 2022-07-25 NOTE — Progress Notes (Signed)
Gynecologic Oncology Return Clinic Visit  07/25/22  Reason for Visit: Follow-up after surgery  Treatment History: Oncology History  Malignant neoplasm of upper-outer quadrant of right breast in female, estrogen receptor positive ([place])  03/26/2016 Surgery   right breast upper outer quadrant lumpectomy 03/26/2016 for a pT1c pN1, stage IIA invasive ductal carcinoma, grade 1, estrogen and progesterone receptor positive, HER-2 nonamplified, with an MIB-1 of 12%              (a) mammaprint from this tumor was read as "low risk".          02/2016 Initial Diagnosis   Malignant neoplasm of upper-outer quadrant of right breast in female, estrogen receptor positive ([place])   03/26/2016 Surgery   a second right breast cancer, invasive lobular, grade 1, measuring 0.6 cm, focally involved the final right medial margin from the 03/26/2016 procedure; this tumor also was estrogen and progesterone receptor positive, HER-2 negative, with an MIB-1 of 3%             (a) medial margin cleared with additional surgery 05/20/2016.   03/2016 -  Anti-estrogen oral therapy   tamoxifen started 04/18/2016--to be continued a minimum of 5 years, likely followed by anastrozole x 2 years   08/19/2016 - 09/29/2016 Radiation Therapy   radiation therapy 08/19/16-09/29/16 with capecitabine sensitization Site/dose:   1) Right breast/ 50Gy in 25 fractions                         2) Right breast boost/ 10 Gy in 5 fractions                         3) Left breast 50.4 Gy in 28 fractions   Malignant neoplasm of overlapping sites of left breast in female, estrogen receptor positive ([place])  04/09/2016 Initial Biopsy   two biopsies from the left breast 04/09/2016 and 04/11/2016, 4.6 cm apart, showed             (a) centrally, an invasive ductal carcinoma, grade 1 or 2, with no prognostic panel available             (b) in the upper outer quadrant, invasive ductal carcinoma, grade 2, estrogen receptor 10% positive, progesterone receptor and  HER-2 negative, with an MIB-1 of 2%             (c) left axillary lymph node biopsy 04/14/2016 showed invasive ductal carcinoma, estrogen and progesterone receptor negative             (d) mammaprint from (2)(c) was also "low risk"   03/2016 Initial Diagnosis   Malignant neoplasm of overlapping sites of left breast in female, estrogen receptor positive ([place])   03/2016 -  Anti-estrogen oral therapy   tamoxifen started 04/18/2016--to be continued a minimum of 5 years, likely followed by anastrozole x 2 years   05/20/2016 Surgery   left lumpectomy 05/20/2016 showed a  pT1c pN1 stage IIA  invasive ductal carcinoma, grade 2, estrogen receptor positive, progesterone receptor negative, HER-2 nonamplified, with an MIB-1 of 2%; margins were positive    07/01/2016 Surgery   status post left sided nipple sparing mastectomy with immediate expander placement 07/01/2016 showing multiple foci of residual grade 2 invasive ductal carcinoma, the largest measuring 0.7 cm, with evidence of lymphovascular invasion and multiple close but negative margins   08/19/2016 - 09/29/2016 Radiation Therapy   radiation therapy 08/19/16-09/29/16 with capecitabine sensitization Site/dose:   1)  Right breast/ 50Gy in 25 fractions                         2) Right breast boost/ 10 Gy in 5 fractions                         3) Left breast 50.4 Gy in 28 fractions     06/26/22: Robotic-assisted laparoscopic total hysterectomy with bilateral salpingo-oophorectomy, enterolysis for approximately 60 minutes, mini laparotomy for specimen removal, repair of vaginal laceration   Interval History: Patient reports overall doing well.  Denies any significant abdominal or pelvic pain.  Denies any vaginal bleeding or discharge.  Has occasional vaginal pruritus.  Bowel function is improving, alternating between Metamucil use and MiraLAX.  Denies any dysuria or frequency but notes that intermittently feels a different sensation when she voids.   Describes this as feeling like a "vacuum".  Past Medical/Surgical History: Past Medical History:  Diagnosis Date   Allergy-induced asthma    prn inhaler   Arterial calcification    sees pcp for takes atorvastatin  for   CAD (coronary artery disease)    Constipation    Emphysema of lung (HCC)    Endometrial thickening on ultrasound    Environmental and seasonal allergies    Family history of adverse reaction to anesthesia    pt's father has hx. of being hard to wake up post-op x 1 time after 8 hour whipple surgery   GERD (gastroesophageal reflux disease)    Hemorrhoids    Hisory of Migraines    History of basal cell carcinoma (BCC) excision    right clavicle   History of breast cancer 2017   bilateral   History of radiation therapy    08/19/16- 09/29/16, Right Breast 50 Gy in 25 fractions, Right Breast boost 10 Gy in 5 fractions, Left Breast 50.4 Gy in 28 fractions   Hypertension    OSA (obstructive sleep apnea)    uses dental appliance mild /moderate osa   Osteoarthritis    hands and feet   Osteopenia 02/2016   T score -1.3 FRAX 8.3%/0.7% stable from prior DEXA   Overactive bladder    Stroke [facility])    Thyroid nodule 2012   Solitary nodule in the lower pole of the left thyroid lobe , pt unaware   Wears glasses for reading    Wears hearing aid in both ears     Past Surgical History:  Procedure Laterality Date   BREAST RECONSTRUCTION WITH PLACEMENT OF TISSUE EXPANDER AND FLEX HD (ACELLULAR HYDRATED DERMIS) Left 07/01/2016   Procedure: BREAST RECONSTRUCTION WITH PLACEMENT OF TISSUE EXPANDER AND  ALLODERM;  Surgeon: Irene Limbo, MD;  Location: [place];  Service: Plastics;  Laterality: Left;   BROW LIFT     COLONOSCOPY  2022   DILATATION & CURETTAGE/HYSTEROSCOPY WITH MYOSURE N/A 12/06/2018   Procedure: DILATATION & CURETTAGE/HYSTEROSCOPY WITH MYOSURE;  Surgeon: [name], MD;  Location: [facility];  Service: Gynecology;   Laterality: N/A;  follow 1st case   DILATATION & CURETTAGE/HYSTEROSCOPY WITH MYOSURE N/A 04/21/2022   Procedure: DILATATION & CURETTAGE/HYSTEROSCOPY;  Surgeon: [name], MD;  Location: [facility];  Service: Gynecology;  Laterality: N/A;   INCISION AND DRAINAGE Left 06/02/2016   Procedure: DRAINAGE of left axilla seroma;  Surgeon: Rolm Bookbinder, MD;  Location: [place];  Service: General;  Laterality: Left;  LIPOSUCTION WITH LIPOFILLING Left 06/11/2018   Procedure: Lipofilling from abdomen to left chest;  Surgeon: Irene Limbo, MD;  Location: [place];  Service: Plastics;  Laterality: Left;   MASTOPEXY Right 04/17/2017   Procedure: RIGHT MASTOPEXY;  Surgeon: Irene Limbo, MD;  Location: [place];  Service: Plastics;  Laterality: Right;   NIPPLE SPARING MASTECTOMY Left 07/01/2016   Procedure: LEFT NIPPLE SPARING MASTECTOMY;  Surgeon: Rolm Bookbinder, MD;  Location: [place];  Service: General;  Laterality: Left;   OPERATIVE ULTRASOUND N/A 04/21/2022   Procedure: OPERATIVE ULTRASOUND;  Surgeon: [name], MD;  Location: [facility];  Service: Gynecology;  Laterality: N/A;   RADIOACTIVE SEED GUIDED PARTIAL MASTECTOMY WITH AXILLARY SENTINEL LYMPH NODE BIOPSY Right 03/26/2016   Procedure: RIGHT BREAST LUMPECTOMY WITH RADIOACTIVE SEED AND SENTINEL LYMPH NODE BIOPSY;  Surgeon: Rolm Bookbinder, MD;  Location: [place];  Service: General;  Laterality: Right;   RADIOACTIVE SEED GUIDED PARTIAL MASTECTOMY WITH AXILLARY SENTINEL LYMPH NODE BIOPSY Left 05/20/2016   Procedure: LEFT BREAST RADIOACTIVE SEED (two seeds) GUIDED LUMPECTOMY WITH LEFT AXILLARY SENTINEL LYMPH NODE BIOPSY AND LEFT SEED TARGETED AXILLARY LYMPH NODE EXCISION;  Surgeon: Rolm Bookbinder, MD;  Location: [place];  Service: General;  Laterality: Left;    RE-EXCISION OF BREAST CANCER,SUPERIOR MARGINS Right 05/20/2016   Procedure: RE-EXCISION OF RIGHT BREAST MARGIN;  Surgeon: Rolm Bookbinder, MD;  Location: [place];  Service: General;  Laterality: Right;   RE-EXCISION OF BREAST CANCER,SUPERIOR MARGINS Left 06/02/2016   Procedure: RE-EXCISION OF BREAST CANCER,SUPERIOR MARGINS;  Surgeon: Rolm Bookbinder, MD;  Location: [place];  Service: General;  Laterality: Left;   REMOVAL OF TISSUE EXPANDER AND PLACEMENT OF IMPLANT Left 04/17/2017   Procedure: REMOVAL OF LEFT TISSUE EXPANDER WITH PLACEMENT OF LEFT SILICONE BREAST IMPLANT;  Surgeon: Irene Limbo, MD;  Location: [place];  Service: Plastics;  Laterality: Left;   ROBOTIC ASSISTED LAPAROSCOPIC LYSIS OF ADHESION N/A 06/26/2022   Procedure: XI ROBOTIC ASSISTED LAPAROSCOPIC LYSIS OF ADHESION;  Surgeon: Lafonda Mosses, MD;  Location: [facility];  Service: Gynecology;  Laterality: N/A;   ROBOTIC ASSISTED TOTAL HYSTERECTOMY WITH BILATERAL SALPINGO OOPHERECTOMY N/A 06/26/2022   Procedure: XI ROBOTIC ASSISTED TOTAL HYSTERECTOMY WITH BILATERAL SALPINGO OOPHORECTOMY, MINI LAPAROTOMY;  Surgeon: Lafonda Mosses, MD;  Location: [facility];  Service: Gynecology;  Laterality: N/A;   shoulder surgery for arthritis Left 07/2021   arthroscopic done at [facility] [place]    Family History  Problem Relation Age of Onset   Hypertension Mother    Heart disease Mother    Lung disease Mother    Hypertension Father    Cancer Father        colon   Heart disease Father    Lung cancer Father    Anesthesia problems Father        hard to wake up post-op   Hypertension Brother    Hyperlipidemia Brother    Diabetes Maternal Grandfather    Breast cancer Paternal Grandmother        Age 78's   Ovarian cancer Neg Hx    Endometrial cancer Neg Hx    Pancreatic cancer Neg Hx    Prostate cancer Neg Hx     Social History   Socioeconomic History    Marital status: Single    Spouse name: Not on file   Number of children: Not on file   Years of  education: Not on file   Highest education level: Not on file  Occupational History   Not on file  Tobacco Use   Smoking status: Former    Packs/day: 1.00    Years: 25.00    Total pack years: 25.00    Types: Cigarettes    Quit date: 07/27/1990    Years since quitting: 32.0   Smokeless tobacco: Never  Vaping Use   Vaping Use: Never used  Substance and Sexual Activity   Alcohol use: Yes    Comment: 2-3 wine spritzer  per day   Drug use: Not Currently   Sexual activity: Not Currently    Partners: Male    Birth control/protection: Post-menopausal    Comment: 1st intercourse 89 yo-5 partners  Other Topics Concern   Not on file  Social History Narrative   Not on file   Social Determinants of Health   Financial Resource Strain: Not on file  Food Insecurity: Not on file  Transportation Needs: Not on file  Physical Activity: Not on file  Stress: Not on file  Social Connections: Not on file    Current Medications:  Current Outpatient Medications:    acetaminophen (TYLENOL) 500 MG tablet, Take 500 mg by mouth every 6 (six) hours as needed for moderate pain., Disp: , Rfl:    albuterol (PROAIR HFA) 108 (90 Base) MCG/ACT inhaler, Inhale 2 puffs into the lungs every 4 (four) hours as needed for wheezing or shortness of breath., Disp: 1 Inhaler, Rfl: 1   ALPRAZolam (XANAX) 0.25 MG tablet, Take 0.25 mg by mouth daily as needed (travel)., Disp: , Rfl:    amLODipine (NORVASC) 2.5 MG tablet, Take 2.5 mg by mouth daily., Disp: , Rfl:    atorvastatin (LIPITOR) 10 MG tablet, Take 10 mg by mouth at bedtime., Disp: , Rfl:    azelastine (ASTELIN) 0.1 % nasal spray, Place 1 spray into both nostrils daily., Disp: , Rfl:    cetirizine (ZYRTEC) 10 MG tablet, Take 10 mg by mouth at bedtime., Disp: , Rfl:    FLOVENT HFA 110 MCG/ACT inhaler, INHALE 1 PUFF BY MOUTH INTO THE LUNGS TWICE DAILY (Patient  taking differently: Inhale 2 puffs into the lungs 2 (two) times daily as needed (shortness of breath).), Disp: 12 g, Rfl: 0   fluticasone (FLONASE) 50 MCG/ACT nasal spray, Place 1 spray into both nostrils daily., Disp: , Rfl:    hydrocortisone (ANUSOL-HC) 2.5 % rectal cream, Place 1 application rectally as needed for hemorrhoids or itching., Disp: , Rfl:    ibuprofen (ADVIL) 200 MG tablet, Take 200 mg by mouth every 6 (six) hours as needed for moderate pain., Disp: , Rfl:    mometasone (NASONEX) 50 MCG/ACT nasal spray, Place 2 sprays into the nose as needed., Disp: , Rfl:    omeprazole (PRILOSEC) 20 MG capsule, Take 20 mg by mouth daily., Disp: , Rfl:    OVER THE COUNTER MEDICATION, Take 2 capsules by mouth daily. MEGASPOREBIOTIC, Disp: , Rfl:    psyllium (METAMUCIL) 58.6 % packet, Take 1 packet by mouth daily., Disp: , Rfl:    RESTASIS 0.05 % ophthalmic emulsion, Place 1 drop into both eyes 2 (two) times daily., Disp: , Rfl:    tamoxifen (NOLVADEX) 20 MG tablet, Take 1 tablet (20 mg total) by mouth daily. Begin taking once active after surgery, Disp: 90 tablet, Rfl: 3   triamcinolone (KENALOG) 0.1 %, Apply 1 Application topically daily as needed (psoriasis)., Disp: , Rfl:    valACYclovir (VALTREX) 1000 MG  tablet, Take 1 tablet (1,000 mg total) by mouth 2 (two) times daily. Take for 1 day as directed above as needed. (Patient taking differently: Take 1,000 mg by mouth 2 (two) times daily as needed (fever blisters).), Disp: 30 tablet, Rfl: 1   valsartan-hydrochlorothiazide (DIOVAN-HCT) 160-12.5 MG tablet, Take 1 tablet by mouth daily., Disp: , Rfl:    venlafaxine XR (EFFEXOR-XR) 75 MG 24 hr capsule, TAKE 1 CAPSULE(75 MG) BY MOUTH DAILY WITH BREAKFAST, Disp: 90 capsule, Rfl: 4   VITAMIN D PO, Take 2,000 Units by mouth daily., Disp: , Rfl:   Review of Systems: Denies appetite changes, fevers, chills, fatigue, unexplained weight changes. Denies hearing loss, neck lumps or masses, mouth sores, ringing  in ears or voice changes. Denies cough or wheezing.  Denies shortness of breath. Denies chest pain or palpitations. Denies leg swelling. Denies abdominal distention, pain, blood in stools, constipation, diarrhea, nausea, vomiting, or early satiety. Denies pain with intercourse, dysuria, frequency, hematuria or incontinence. Denies hot flashes, pelvic pain, vaginal bleeding or vaginal discharge.   Denies joint pain, back pain or muscle pain/cramps. Denies itching, rash, or wounds. Denies dizziness, headaches, numbness or seizures. Denies swollen lymph nodes or glands, denies easy bruising or bleeding. Denies anxiety, depression, confusion, or decreased concentration.  Physical Exam: BP 137/81 (BP Location: Left Arm)   Pulse 66   Temp 98.7 F (37.1 C) (Oral)   Resp 16   Ht _0  (1.626 m)   Wt 161 lb 1.6 oz (73.1 kg)   LMP 07/28/1996 (Approximate)   SpO2 100%   BMI 27.65 kg/m  General: Alert, oriented, no acute distress. HEENT: Normocephalic, atraumatic, sclera anicteric. Chest: Unlabored breathing on room air.  Lungs clear to auscultation bilaterally. Abdomen: soft, nontender.  Normoactive bowel sounds.  No masses or hepatosplenomegaly appreciated.  Well-healed incisions. Extremities: Grossly normal range of motion.  Warm, well perfused.  No edema bilaterally. Skin: No rashes or lesions noted. GU: Normal appearing external genitalia without erythema, excoriation, or lesions.  Speculum exam reveals posterior distal vaginal sutures in place, cuff itself is intact, no discharge or bleeding, suture visible.  Bimanual exam reveals cuff intact, no fluctuance or tenderness to palpation.    Laboratory & Radiologic Studies: A. UTERUS AND CERVIX, HYSTERECTOMY:  - Uterus with leiomyomata, largest measuring 0.6 cm  - Benign inactive endometrium with adenomyosis  - Benign unremarkable cervix  - No evidence of malignancy   B. CERVIX, ANTERIOR, EXCISION:  - Portion of benign unremarkable  cervix   C. OVARIES AND FALLOPIAN TUBES, BILATERAL, SALPINGO OOPHORECTOMY:  - Bilateral ovaries and segments of fallopian tubes with no specific  histopathologic changes  - No evidence of malignancy   Assessment & Plan: Najiyah LAINI URICK is a 71 y.o. woman s/p robotic total hysterectomy and BSO in the setting of postmenopausal bleeding with nondiagnostic endometrial sampling.  Patient is overall doing well and meeting postoperative milestones.  Discussed intermittent pruritus which I suspect is related to healing of the distal vagina where I had to place suture.  Also discussed bladder sensation and that there can be some adjustment after surgery.  I have asked her to call me if the symptom does not improve over the next several weeks.  We reviewed her pathology from surgery.  Patient was given a copy of her pathology report.  She is very happy with this news.  We discussed again findings during surgery of adhesive disease, specifically rectum adherent to the posterior uterus.  18 minutes of total time was  spent for this patient encounter, including preparation, face-to-face counseling with the patient and coordination of care, and documentation of the encounter.  Jeral Pinch, MD  Division of Gynecologic Oncology  Department of Obstetrics and Gynecology  [facility]

## 2022-07-27 ENCOUNTER — Encounter: Payer: Self-pay | Admitting: Gynecologic Oncology

## 2022-07-30 ENCOUNTER — Telehealth: Payer: Self-pay | Admitting: Surgery

## 2022-07-30 NOTE — Telephone Encounter (Signed)
Called patient to assess umbilical hernia per Sabrina Tapia. Patient states she was told in the past by 2 different doctors that she has a small hernia near her belly button. Patient states that she was watching a comedian and laughed and it popped out. It went back in on it's own after a day. Denies pain. Patient denies any concerns with her incision, no drainage, swelling, or intestines pushing through. Patient advised she may use an abdominal binder temporarily but that she should follow up with her gastroenterologist for further treatment. Patient verbalized understanding and had no further questions at this time. `

## 2022-07-30 NOTE — Telephone Encounter (Signed)
This is something a general surgeon would repair, not her GI. If she is having more symptoms from it, she can call us to send a referral to [facility]. If not, it is fine to continue to monitor for any change

## 2022-07-31 ENCOUNTER — Telehealth: Payer: Self-pay | Admitting: Gynecologic Oncology

## 2022-07-31 NOTE — Telephone Encounter (Signed)
Called the patient to discuss recent symptoms.  She has noticed at least several years of having more fullness or a slight bulge to the right of her umbilicus.  This far preceded surgery.  She has been told by a couple people that she likely has a small hernia.  She and I discussed that there was no significant hernia at the time of her surgery.  The area where she has had some discomfort recently is not near her supraumbilical incision.  We discussed that if she were to continue to have more symptoms, she could be seen by general surgeon (not her GI) to discuss possible surgical intervention.  She is aware of symptoms that should prompt coming to the emergency department urgently such as significant pain, nausea with emesis, or a bulge that cannot be reduced.  Jeral Pinch MD Gynecologic Oncology

## 2022-08-11 DIAGNOSIS — M2021 Hallux rigidus, right foot: Secondary | ICD-10-CM | POA: Diagnosis not present

## 2022-08-13 DIAGNOSIS — M85851 Other specified disorders of bone density and structure, right thigh: Secondary | ICD-10-CM | POA: Diagnosis not present

## 2022-08-13 DIAGNOSIS — M85852 Other specified disorders of bone density and structure, left thigh: Secondary | ICD-10-CM | POA: Diagnosis not present

## 2022-08-13 DIAGNOSIS — Z780 Asymptomatic menopausal state: Secondary | ICD-10-CM | POA: Diagnosis not present

## 2022-08-14 DIAGNOSIS — H8112 Benign paroxysmal vertigo, left ear: Secondary | ICD-10-CM | POA: Diagnosis not present

## 2022-08-18 ENCOUNTER — Other Ambulatory Visit: Payer: Self-pay

## 2022-08-18 ENCOUNTER — Telehealth: Payer: Self-pay | Admitting: *Deleted

## 2022-08-18 ENCOUNTER — Inpatient Hospital Stay: Payer: Medicare PPO | Attending: Gynecologic Oncology | Admitting: Gynecologic Oncology

## 2022-08-18 VITALS — BP 143/82 | HR 75 | Temp 98.4°F | Resp 14 | Wt 162.6 lb

## 2022-08-18 DIAGNOSIS — N939 Abnormal uterine and vaginal bleeding, unspecified: Secondary | ICD-10-CM

## 2022-08-18 NOTE — Telephone Encounter (Signed)
Patient called and stated "I had surgery with Dr Berline Lopes on 11/30 ([place]). This morning I started having some bleeding. It was when I was wiping and some fell in the toilet. It was a small clot. The blood is bright fresh red blood. It started last night and this morning. It is a little more than spotting. I am wearing a panty liner, just started this morning. I have some cramping and discomfort, I feel like something is wrong. I have done no heavy lifting and nothing in the vagina. I do have toe surgery tomorrow." Explained that the messgae would be given to Dr Chancy Hurter APP and the office would call her back.

## 2022-08-18 NOTE — Patient Instructions (Signed)
There was a scant amount of older blood noted in the vagina. The vaginal cuff was intact.   Continue to monitor and call the office for any changes.   Good luck on your surgery tomorrow.  Please call for any needs or concerns.

## 2022-08-19 DIAGNOSIS — M2021 Hallux rigidus, right foot: Secondary | ICD-10-CM | POA: Diagnosis not present

## 2022-08-19 DIAGNOSIS — G8918 Other acute postprocedural pain: Secondary | ICD-10-CM | POA: Diagnosis not present

## 2022-08-19 NOTE — Progress Notes (Signed)
Gynecologic Oncology Symptom Management Appointment  08/18/2022  Reason for Visit: Evaluation of vaginal spotting  Treatment History: Oncology History  Malignant neoplasm of upper-outer quadrant of right breast in female, estrogen receptor positive ([place])  03/26/2016 Surgery   right breast upper outer quadrant lumpectomy 03/26/2016 for a pT1c pN1, stage IIA invasive ductal carcinoma, grade 1, estrogen and progesterone receptor positive, HER-2 nonamplified, with an MIB-1 of 12%              (a) mammaprint from this tumor was read as "low risk".          02/2016 Initial Diagnosis   Malignant neoplasm of upper-outer quadrant of right breast in female, estrogen receptor positive ([place])   03/26/2016 Surgery   a second right breast cancer, invasive lobular, grade 1, measuring 0.6 cm, focally involved the final right medial margin from the 03/26/2016 procedure; this tumor also was estrogen and progesterone receptor positive, HER-2 negative, with an MIB-1 of 3%             (a) medial margin cleared with additional surgery 05/20/2016.   03/2016 -  Anti-estrogen oral therapy   tamoxifen started 04/18/2016--to be continued a minimum of 5 years, likely followed by anastrozole x 2 years   08/19/2016 - 09/29/2016 Radiation Therapy   radiation therapy 08/19/16-09/29/16 with capecitabine sensitization Site/dose:   1) Right breast/ 50Gy in 25 fractions                         2) Right breast boost/ 10 Gy in 5 fractions                         3) Left breast 50.4 Gy in 28 fractions   Malignant neoplasm of overlapping sites of left breast in female, estrogen receptor positive ([place])  04/09/2016 Initial Biopsy   two biopsies from the left breast 04/09/2016 and 04/11/2016, 4.6 cm apart, showed             (a) centrally, an invasive ductal carcinoma, grade 1 or 2, with no prognostic panel available             (b) in the upper outer quadrant, invasive ductal carcinoma, grade 2, estrogen receptor 10% positive,  progesterone receptor and HER-2 negative, with an MIB-1 of 2%             (c) left axillary lymph node biopsy 04/14/2016 showed invasive ductal carcinoma, estrogen and progesterone receptor negative             (d) mammaprint from (2)(c) was also "low risk"   03/2016 Initial Diagnosis   Malignant neoplasm of overlapping sites of left breast in female, estrogen receptor positive ([place])   03/2016 -  Anti-estrogen oral therapy   tamoxifen started 04/18/2016--to be continued a minimum of 5 years, likely followed by anastrozole x 2 years   05/20/2016 Surgery   left lumpectomy 05/20/2016 showed a  pT1c pN1 stage IIA  invasive ductal carcinoma, grade 2, estrogen receptor positive, progesterone receptor negative, HER-2 nonamplified, with an MIB-1 of 2%; margins were positive    07/01/2016 Surgery   status post left sided nipple sparing mastectomy with immediate expander placement 07/01/2016 showing multiple foci of residual grade 2 invasive ductal carcinoma, the largest measuring 0.7 cm, with evidence of lymphovascular invasion and multiple close but negative margins   08/19/2016 - 09/29/2016 Radiation Therapy   radiation therapy 08/19/16-09/29/16 with capecitabine sensitization Site/dose:  1) Right breast/ 50Gy in 25 fractions                         2) Right breast boost/ 10 Gy in 5 fractions                         3) Left breast 50.4 Gy in 28 fractions     06/26/22: Robotic-assisted laparoscopic total hysterectomy with bilateral salpingo-oophorectomy, enterolysis for approximately 60 minutes, mini laparotomy for specimen removal, repair of vaginal laceration   Interval History: Patient reports overall doing well.  Denies any significant abdominal or pelvic pain. Incisions have healed well. Bowels and bladder functioning without difficulty. She woke up early this am and had bright red vaginal bleeding. She reported seeing a small clot as well when using the bathroom. She states she could tell  something was going on and had symptoms similar to cramps. No preceding strenuous activity or vaginal penetration. She has toe surgery tomorrow. No other concerns voiced.  Past Medical/Surgical History: Past Medical History:  Diagnosis Date   Allergy-induced asthma    prn inhaler   Arterial calcification    sees pcp for takes atorvastatin  for   CAD (coronary artery disease)    Constipation    Emphysema of lung (HCC)    Endometrial thickening on ultrasound    Environmental and seasonal allergies    Family history of adverse reaction to anesthesia    pt's father has hx. of being hard to wake up post-op x 1 time after 8 hour whipple surgery   GERD (gastroesophageal reflux disease)    Hemorrhoids    Hisory of Migraines    History of basal cell carcinoma (BCC) excision    right clavicle   History of breast cancer 2017   bilateral   History of radiation therapy    08/19/16- 09/29/16, Right Breast 50 Gy in 25 fractions, Right Breast boost 10 Gy in 5 fractions, Left Breast 50.4 Gy in 28 fractions   Hypertension    OSA (obstructive sleep apnea)    uses dental appliance mild /moderate osa   Osteoarthritis    hands and feet   Osteopenia 02/2016   T score -1.3 FRAX 8.3%/0.7% stable from prior DEXA   Overactive bladder    Stroke [facility])    Thyroid nodule 2012   Solitary nodule in the lower pole of the left thyroid lobe , pt unaware   Wears glasses for reading    Wears hearing aid in both ears     Past Surgical History:  Procedure Laterality Date   BREAST RECONSTRUCTION WITH PLACEMENT OF TISSUE EXPANDER AND FLEX HD (ACELLULAR HYDRATED DERMIS) Left 07/01/2016   Procedure: BREAST RECONSTRUCTION WITH PLACEMENT OF TISSUE EXPANDER AND  ALLODERM;  Surgeon: Irene Limbo, MD;  Location: [place];  Service: Plastics;  Laterality: Left;   BROW LIFT     COLONOSCOPY  2022   DILATATION & CURETTAGE/HYSTEROSCOPY WITH MYOSURE N/A 12/06/2018   Procedure: DILATATION &  CURETTAGE/HYSTEROSCOPY WITH MYOSURE;  Surgeon: [name], MD;  Location: [facility];  Service: Gynecology;  Laterality: N/A;  follow 1st case   DILATATION & CURETTAGE/HYSTEROSCOPY WITH MYOSURE N/A 04/21/2022   Procedure: DILATATION & CURETTAGE/HYSTEROSCOPY;  Surgeon: [name], MD;  Location: [facility];  Service: Gynecology;  Laterality: N/A;   INCISION AND DRAINAGE Left 06/02/2016   Procedure: DRAINAGE of left axilla  seroma;  Surgeon: Rolm Bookbinder, MD;  Location: [place];  Service: General;  Laterality: Left;   LIPOSUCTION WITH LIPOFILLING Left 06/11/2018   Procedure: Lipofilling from abdomen to left chest;  Surgeon: Irene Limbo, MD;  Location: [place];  Service: Plastics;  Laterality: Left;   MASTOPEXY Right 04/17/2017   Procedure: RIGHT MASTOPEXY;  Surgeon: Irene Limbo, MD;  Location: [place];  Service: Plastics;  Laterality: Right;   NIPPLE SPARING MASTECTOMY Left 07/01/2016   Procedure: LEFT NIPPLE SPARING MASTECTOMY;  Surgeon: Rolm Bookbinder, MD;  Location: [place];  Service: General;  Laterality: Left;   OPERATIVE ULTRASOUND N/A 04/21/2022   Procedure: OPERATIVE ULTRASOUND;  Surgeon: [name], MD;  Location: [facility];  Service: Gynecology;  Laterality: N/A;   RADIOACTIVE SEED GUIDED PARTIAL MASTECTOMY WITH AXILLARY SENTINEL LYMPH NODE BIOPSY Right 03/26/2016   Procedure: RIGHT BREAST LUMPECTOMY WITH RADIOACTIVE SEED AND SENTINEL LYMPH NODE BIOPSY;  Surgeon: Rolm Bookbinder, MD;  Location: [place];  Service: General;  Laterality: Right;   RADIOACTIVE SEED GUIDED PARTIAL MASTECTOMY WITH AXILLARY SENTINEL LYMPH NODE BIOPSY Left 05/20/2016   Procedure: LEFT BREAST RADIOACTIVE SEED (two seeds) GUIDED LUMPECTOMY WITH LEFT AXILLARY SENTINEL LYMPH NODE BIOPSY AND LEFT SEED TARGETED AXILLARY  LYMPH NODE EXCISION;  Surgeon: Rolm Bookbinder, MD;  Location: [place];  Service: General;  Laterality: Left;   RE-EXCISION OF BREAST CANCER,SUPERIOR MARGINS Right 05/20/2016   Procedure: RE-EXCISION OF RIGHT BREAST MARGIN;  Surgeon: Rolm Bookbinder, MD;  Location: [place];  Service: General;  Laterality: Right;   RE-EXCISION OF BREAST CANCER,SUPERIOR MARGINS Left 06/02/2016   Procedure: RE-EXCISION OF BREAST CANCER,SUPERIOR MARGINS;  Surgeon: Rolm Bookbinder, MD;  Location: [place];  Service: General;  Laterality: Left;   REMOVAL OF TISSUE EXPANDER AND PLACEMENT OF IMPLANT Left 04/17/2017   Procedure: REMOVAL OF LEFT TISSUE EXPANDER WITH PLACEMENT OF LEFT SILICONE BREAST IMPLANT;  Surgeon: Irene Limbo, MD;  Location: [place];  Service: Plastics;  Laterality: Left;   ROBOTIC ASSISTED LAPAROSCOPIC LYSIS OF ADHESION N/A 06/26/2022   Procedure: XI ROBOTIC ASSISTED LAPAROSCOPIC LYSIS OF ADHESION;  Surgeon: Lafonda Mosses, MD;  Location: [facility];  Service: Gynecology;  Laterality: N/A;   ROBOTIC ASSISTED TOTAL HYSTERECTOMY WITH BILATERAL SALPINGO OOPHERECTOMY N/A 06/26/2022   Procedure: XI ROBOTIC ASSISTED TOTAL HYSTERECTOMY WITH BILATERAL SALPINGO OOPHORECTOMY, MINI LAPAROTOMY;  Surgeon: Lafonda Mosses, MD;  Location: [facility];  Service: Gynecology;  Laterality: N/A;   shoulder surgery for arthritis Left 07/2021   arthroscopic done at [facility] [place]    Family History  Problem Relation Age of Onset   Hypertension Mother    Heart disease Mother    Lung disease Mother    Hypertension Father    Cancer Father        colon   Heart disease Father    Lung cancer Father    Anesthesia problems Father        hard to wake up post-op   Hypertension Brother    Hyperlipidemia Brother    Diabetes Maternal Grandfather    Breast cancer Paternal Grandmother        Age 44's   Ovarian cancer Neg Hx     Endometrial cancer Neg Hx    Pancreatic cancer Neg Hx    Prostate cancer Neg Hx     Social History   Socioeconomic History   Marital status: Single  Spouse name: Not on file   Number of children: Not on file   Years of education: Not on file   Highest education level: Not on file  Occupational History   Not on file  Tobacco Use   Smoking status: Former    Packs/day: 1.00    Years: 25.00    Total pack years: 25.00    Types: Cigarettes    Quit date: 07/27/1990    Years since quitting: 32.0   Smokeless tobacco: Never  Vaping Use   Vaping Use: Never used  Substance and Sexual Activity   Alcohol use: Yes    Comment: 2-3 wine spritzer  per day   Drug use: Not Currently   Sexual activity: Not Currently    Partners: Male    Birth control/protection: Post-menopausal    Comment: 1st intercourse 32 yo-5 partners  Other Topics Concern   Not on file  Social History Narrative   Not on file   Social Determinants of Health   Financial Resource Strain: Not on file  Food Insecurity: Not on file  Transportation Needs: Not on file  Physical Activity: Not on file  Stress: Not on file  Social Connections: Not on file    Current Medications:  Current Outpatient Medications:    acetaminophen (TYLENOL) 500 MG tablet, Take 500 mg by mouth every 6 (six) hours as needed for moderate pain., Disp: , Rfl:    albuterol (PROAIR HFA) 108 (90 Base) MCG/ACT inhaler, Inhale 2 puffs into the lungs every 4 (four) hours as needed for wheezing or shortness of breath., Disp: 1 Inhaler, Rfl: 1   ALPRAZolam (XANAX) 0.25 MG tablet, Take 0.25 mg by mouth daily as needed (travel)., Disp: , Rfl:    amLODipine (NORVASC) 2.5 MG tablet, Take 2.5 mg by mouth daily., Disp: , Rfl:    atorvastatin (LIPITOR) 10 MG tablet, Take 10 mg by mouth at bedtime., Disp: , Rfl:    azelastine (ASTELIN) 0.1 % nasal spray, Place 1 spray into both nostrils daily., Disp: , Rfl:    cetirizine (ZYRTEC) 10 MG tablet, Take 10 mg by  mouth at bedtime., Disp: , Rfl:    FLOVENT HFA 110 MCG/ACT inhaler, INHALE 1 PUFF BY MOUTH INTO THE LUNGS TWICE DAILY (Patient taking differently: Inhale 2 puffs into the lungs 2 (two) times daily as needed (shortness of breath).), Disp: 12 g, Rfl: 0   fluticasone (FLONASE) 50 MCG/ACT nasal spray, Place 1 spray into both nostrils daily., Disp: , Rfl:    hydrocortisone (ANUSOL-HC) 2.5 % rectal cream, Place 1 application rectally as needed for hemorrhoids or itching., Disp: , Rfl:    ibuprofen (ADVIL) 200 MG tablet, Take 200 mg by mouth every 6 (six) hours as needed for moderate pain., Disp: , Rfl:    mometasone (NASONEX) 50 MCG/ACT nasal spray, Place 2 sprays into the nose as needed., Disp: , Rfl:    omeprazole (PRILOSEC) 20 MG capsule, Take 20 mg by mouth daily., Disp: , Rfl:    OVER THE COUNTER MEDICATION, Take 2 capsules by mouth daily. MEGASPOREBIOTIC, Disp: , Rfl:    psyllium (METAMUCIL) 58.6 % packet, Take 1 packet by mouth daily., Disp: , Rfl:    RESTASIS 0.05 % ophthalmic emulsion, Place 1 drop into both eyes 2 (two) times daily., Disp: , Rfl:    tamoxifen (NOLVADEX) 20 MG tablet, Take 1 tablet (20 mg total) by mouth daily. Begin taking once active after surgery, Disp: 90 tablet, Rfl: 3   triamcinolone (KENALOG) 0.1 %, Apply  1 Application topically daily as needed (psoriasis)., Disp: , Rfl:    valACYclovir (VALTREX) 1000 MG tablet, Take 1 tablet (1,000 mg total) by mouth 2 (two) times daily. Take for 1 day as directed above as needed. (Patient taking differently: Take 1,000 mg by mouth 2 (two) times daily as needed (fever blisters).), Disp: 30 tablet, Rfl: 1   valsartan-hydrochlorothiazide (DIOVAN-HCT) 160-12.5 MG tablet, Take 1 tablet by mouth daily., Disp: , Rfl:    venlafaxine XR (EFFEXOR-XR) 75 MG 24 hr capsule, TAKE 1 CAPSULE(75 MG) BY MOUTH DAILY WITH BREAKFAST, Disp: 90 capsule, Rfl: 4   VITAMIN D PO, Take 2,000 Units by mouth daily., Disp: , Rfl:   Review of Systems: Additional  review negative  Physical Exam: BP (!) 143/82 (BP Location: Left Arm, Patient Position: Sitting)   Pulse 75   Temp 98.4 F (36.9 C) (Oral)   Resp 14   Wt 162 lb 9.6 oz (73.8 kg)   LMP 07/28/1996 (Approximate)   SpO2 99%   BMI 27.91 kg/m  General: Alert, oriented, no acute distress. HEENT: Normocephalic, atraumatic, sclera anicteric. Chest: Unlabored breathing on room air.  Abdomen: soft, nontender.  Well-healed incisions. Extremities: Grossly normal range of motion.  Warm, well perfused.  No edema bilaterally. Skin: No rashes or lesions noted. GU: Normal appearing external genitalia without erythema, excoriation, or lesions.  Speculum exam reveals intact, healing cuff. Scant amount of dark blood noted in the vagina without identification of active bleeding area. No discharge.  Bimanual exam reveals cuff intact, no fluctuance or tenderness to palpation.    Laboratory & Radiologic Studies: A. UTERUS AND CERVIX, HYSTERECTOMY:  - Uterus with leiomyomata, largest measuring 0.6 cm  - Benign inactive endometrium with adenomyosis  - Benign unremarkable cervix  - No evidence of malignancy   B. CERVIX, ANTERIOR, EXCISION:  - Portion of benign unremarkable cervix   C. OVARIES AND FALLOPIAN TUBES, BILATERAL, SALPINGO OOPHORECTOMY:  - Bilateral ovaries and segments of fallopian tubes with no specific  histopathologic changes  - No evidence of malignancy   Assessment & Plan: Sabrina Tapia is a 72 y.o. woman s/p robotic total hysterectomy and BSO in the setting of postmenopausal bleeding with nondiagnostic endometrial sampling.  Patient is overall doing well and meeting postoperative milestones. She is advised to monitor the spotting and call for any changes, persistent symptoms. No area of active bleeding noted on exam. She is advised to call for any needs or concerns. Our office will touch base with her at the beginning of next week to see how her symptoms are.   Joylene John NP

## 2022-08-22 ENCOUNTER — Telehealth: Payer: Self-pay | Admitting: *Deleted

## 2022-08-22 NOTE — Telephone Encounter (Signed)
Follow-up call to patient regarding vaginal bleeding.  Reports doing fine since office visit. No further bleeding. Denies pain.   Encouraged to call back if bleeding returns.

## 2022-09-01 DIAGNOSIS — M2021 Hallux rigidus, right foot: Secondary | ICD-10-CM | POA: Diagnosis not present

## 2022-09-23 DIAGNOSIS — Z85828 Personal history of other malignant neoplasm of skin: Secondary | ICD-10-CM | POA: Diagnosis not present

## 2022-09-23 DIAGNOSIS — D2271 Melanocytic nevi of right lower limb, including hip: Secondary | ICD-10-CM | POA: Diagnosis not present

## 2022-09-23 DIAGNOSIS — D239 Other benign neoplasm of skin, unspecified: Secondary | ICD-10-CM | POA: Diagnosis not present

## 2022-09-23 DIAGNOSIS — Z86018 Personal history of other benign neoplasm: Secondary | ICD-10-CM | POA: Diagnosis not present

## 2022-09-23 DIAGNOSIS — L814 Other melanin hyperpigmentation: Secondary | ICD-10-CM | POA: Diagnosis not present

## 2022-09-23 DIAGNOSIS — L578 Other skin changes due to chronic exposure to nonionizing radiation: Secondary | ICD-10-CM | POA: Diagnosis not present

## 2022-09-23 DIAGNOSIS — D225 Melanocytic nevi of trunk: Secondary | ICD-10-CM | POA: Diagnosis not present

## 2022-09-23 DIAGNOSIS — L821 Other seborrheic keratosis: Secondary | ICD-10-CM | POA: Diagnosis not present

## 2022-09-23 DIAGNOSIS — L719 Rosacea, unspecified: Secondary | ICD-10-CM | POA: Diagnosis not present

## 2022-09-26 ENCOUNTER — Other Ambulatory Visit: Payer: Self-pay | Admitting: Hematology and Oncology

## 2022-09-29 DIAGNOSIS — M2021 Hallux rigidus, right foot: Secondary | ICD-10-CM | POA: Diagnosis not present

## 2022-10-29 NOTE — Progress Notes (Unsigned)
Sabrina Tapia D.[place] Phone: [phone]   Assessment and Plan:     There are no diagnoses linked to this encounter.  ***   Pertinent previous records reviewed include ***   Follow Up: ***     Subjective:   I, Kiel Cockerell, am serving as a Education administrator for Doctor Glennon Mac  Chief Complaint: piriformis pain   HPI:   10/30/2022 Patient is a 72 year old female complaining of piriformis pain. Patient states  Relevant Historical Information: ***  Additional pertinent review of systems negative.   Current Outpatient Medications:    acetaminophen (TYLENOL) 500 MG tablet, Take 500 mg by mouth every 6 (six) hours as needed for moderate pain., Disp: , Rfl:    albuterol (PROAIR HFA) 108 (90 Base) MCG/ACT inhaler, Inhale 2 puffs into the lungs every 4 (four) hours as needed for wheezing or shortness of breath., Disp: 1 Inhaler, Rfl: 1   ALPRAZolam (XANAX) 0.25 MG tablet, Take 0.25 mg by mouth daily as needed (travel)., Disp: , Rfl:    amLODipine (NORVASC) 2.5 MG tablet, Take 2.5 mg by mouth daily., Disp: , Rfl:    atorvastatin (LIPITOR) 10 MG tablet, Take 10 mg by mouth at bedtime., Disp: , Rfl:    azelastine (ASTELIN) 0.1 % nasal spray, Place 1 spray into both nostrils daily., Disp: , Rfl:    cetirizine (ZYRTEC) 10 MG tablet, Take 10 mg by mouth at bedtime., Disp: , Rfl:    FLOVENT HFA 110 MCG/ACT inhaler, INHALE 1 PUFF BY MOUTH INTO THE LUNGS TWICE DAILY (Patient taking differently: Inhale 2 puffs into the lungs 2 (two) times daily as needed (shortness of breath).), Disp: 12 g, Rfl: 0   fluticasone (FLONASE) 50 MCG/ACT nasal spray, Place 1 spray into both nostrils daily., Disp: , Rfl:    hydrocortisone (ANUSOL-HC) 2.5 % rectal cream, Place 1 application rectally as needed for hemorrhoids or itching., Disp: , Rfl:    ibuprofen (ADVIL) 200 MG tablet, Take 200 mg by mouth every 6 (six) hours as needed for  moderate pain., Disp: , Rfl:    mometasone (NASONEX) 50 MCG/ACT nasal spray, Place 2 sprays into the nose as needed., Disp: , Rfl:    omeprazole (PRILOSEC) 20 MG capsule, Take 20 mg by mouth daily., Disp: , Rfl:    OVER THE COUNTER MEDICATION, Take 2 capsules by mouth daily. MEGASPOREBIOTIC, Disp: , Rfl:    psyllium (METAMUCIL) 58.6 % packet, Take 1 packet by mouth daily., Disp: , Rfl:    RESTASIS 0.05 % ophthalmic emulsion, Place 1 drop into both eyes 2 (two) times daily., Disp: , Rfl:    tamoxifen (NOLVADEX) 20 MG tablet, Take 1 tablet (20 mg total) by mouth daily. Begin taking once active after surgery, Disp: 90 tablet, Rfl: 3   triamcinolone (KENALOG) 0.1 %, Apply 1 Application topically daily as needed (psoriasis)., Disp: , Rfl:    valACYclovir (VALTREX) 1000 MG tablet, Take 1 tablet (1,000 mg total) by mouth 2 (two) times daily. Take for 1 day as directed above as needed. (Patient taking differently: Take 1,000 mg by mouth 2 (two) times daily as needed (fever blisters).), Disp: 30 tablet, Rfl: 1   valsartan-hydrochlorothiazide (DIOVAN-HCT) 160-12.5 MG tablet, Take 1 tablet by mouth daily., Disp: , Rfl:    venlafaxine XR (EFFEXOR-XR) 75 MG 24 hr capsule, TAKE 1 CAPSULE(75 MG) BY MOUTH DAILY WITH BREAKFAST, Disp: 90 capsule, Rfl: 4   VITAMIN D PO,  Take 2,000 Units by mouth daily., Disp: , Rfl:    Objective:     There were no vitals filed for this visit.    There is no height or weight on file to calculate BMI.    Physical Exam:    ***   Electronically signed by:  Sabrina Tapia D.[facility] 7:17 AM 10/29/22

## 2022-10-30 ENCOUNTER — Ambulatory Visit: Payer: Medicare PPO | Admitting: Sports Medicine

## 2022-10-30 ENCOUNTER — Ambulatory Visit: Payer: Self-pay

## 2022-10-30 VITALS — BP 124/80 | HR 106 | Ht 64.0 in | Wt 162.0 lb

## 2022-10-30 DIAGNOSIS — S76001A Unspecified injury of muscle, fascia and tendon of right hip, initial encounter: Secondary | ICD-10-CM | POA: Diagnosis not present

## 2022-10-30 DIAGNOSIS — S76019A Strain of muscle, fascia and tendon of unspecified hip, initial encounter: Secondary | ICD-10-CM

## 2022-10-30 MED ORDER — METHYLPREDNISOLONE ACETATE 80 MG/ML IJ SUSP
80.0000 mg | Freq: Once | INTRAMUSCULAR | Status: AC
  Administered 2022-10-30: 80 mg via INTRAMUSCULAR

## 2022-10-30 MED ORDER — MELOXICAM 15 MG PO TABS: 15.0000 mg | ORAL_TABLET | Freq: Every day | ORAL | 0 refills | Status: DC

## 2022-10-30 MED ORDER — KETOROLAC TROMETHAMINE 60 MG/2ML IM SOLN
60.0000 mg | Freq: Once | INTRAMUSCULAR | Status: AC
  Administered 2022-10-30: 60 mg via INTRAMUSCULAR

## 2022-10-30 NOTE — Patient Instructions (Addendum)
Good to see you  - Start meloxicam 15 mg daily x2 weeks.  May use Tylenol [id number] mg 2 to 3 times a day for breakthrough pain. Glute HEP  3 week follow up

## 2022-11-10 DIAGNOSIS — M2021 Hallux rigidus, right foot: Secondary | ICD-10-CM | POA: Diagnosis not present

## 2022-11-10 DIAGNOSIS — L910 Hypertrophic scar: Secondary | ICD-10-CM | POA: Diagnosis not present

## 2022-11-15 ENCOUNTER — Emergency Department (HOSPITAL_BASED_OUTPATIENT_CLINIC_OR_DEPARTMENT_OTHER)
Admission: EM | Admit: 2022-11-15 | Discharge: 2022-11-15 | Disposition: A | Discharge status: Home / Self Care | Payer: Medicare PPO | Attending: Emergency Medicine | Admitting: Emergency Medicine

## 2022-11-15 ENCOUNTER — Emergency Department (HOSPITAL_BASED_OUTPATIENT_CLINIC_OR_DEPARTMENT_OTHER): Payer: Medicare PPO | Admitting: Radiology

## 2022-11-15 ENCOUNTER — Encounter (HOSPITAL_BASED_OUTPATIENT_CLINIC_OR_DEPARTMENT_OTHER): Payer: Self-pay | Admitting: Emergency Medicine

## 2022-11-15 ENCOUNTER — Other Ambulatory Visit: Payer: Self-pay

## 2022-11-15 DIAGNOSIS — S4992XA Unspecified injury of left shoulder and upper arm, initial encounter: Secondary | ICD-10-CM | POA: Diagnosis not present

## 2022-11-15 DIAGNOSIS — Z853 Personal history of malignant neoplasm of breast: Secondary | ICD-10-CM | POA: Diagnosis not present

## 2022-11-15 DIAGNOSIS — S52109A Unspecified fracture of upper end of unspecified radius, initial encounter for closed fracture: Secondary | ICD-10-CM

## 2022-11-15 DIAGNOSIS — M25522 Pain in left elbow: Secondary | ICD-10-CM | POA: Diagnosis present

## 2022-11-15 DIAGNOSIS — W0110XA Fall on same level from slipping, tripping and stumbling with subsequent striking against unspecified object, initial encounter: Secondary | ICD-10-CM | POA: Insufficient documentation

## 2022-11-15 DIAGNOSIS — S52502A Unspecified fracture of the lower end of left radius, initial encounter for closed fracture: Secondary | ICD-10-CM | POA: Diagnosis not present

## 2022-11-15 DIAGNOSIS — S52102A Unspecified fracture of upper end of left radius, initial encounter for closed fracture: Secondary | ICD-10-CM | POA: Diagnosis not present

## 2022-11-15 DIAGNOSIS — S52135A Nondisplaced fracture of neck of left radius, initial encounter for closed fracture: Secondary | ICD-10-CM | POA: Diagnosis not present

## 2022-11-15 NOTE — Discharge Instructions (Signed)
You are seen emergency for fall with elbow injury.  As we discussed you have a fracture at the proximal radial neck of your left arm.  This is best treated with immobilization and pain control.  We have provided you with a splint and a sling, and I recommend following up with your orthopedist.  Continue to monitor how you're doing and return to the ER for new or worsening symptoms.

## 2022-11-15 NOTE — ED Triage Notes (Signed)
Pt via pov from home after tripping over a gas hose. Pt reports that she has pain in her elbow and forearm. Pt did hit her head and has a small bruise, but is not concerned about that. Denies LOC. Pt alert & oriented, nad noted.

## 2022-11-15 NOTE — ED Provider Notes (Signed)
[place] EMERGENCY DEPARTMENT AT [facility] Provider Note   CSN: [id number] Arrival date & time: 11/15/22  1756     History  Chief Complaint  Patient presents with   Sabrina Tapia is a 72 y.o. female with history of osteopenia, GERD, breast cancer, hypertension, OSA, CAD, emphysema who presents the emergency department complaining of pain after mechanical fall.  Patient states that she was at a gas station and was going to step over the hose, when her foot caught the hose and she fell.  She landed on the ground hitting her head, no LOC, and striking her left arm on the ground.  She has been having pain in the left elbow since.  She is not on blood thinners.   Fall       Home Medications Prior to Admission medications   Medication Sig Start Date End Date Taking? Authorizing Provider  acetaminophen (TYLENOL) 500 MG tablet Take 500 mg by mouth every 6 (six) hours as needed for moderate pain.    [provider]  albuterol (PROAIR HFA) 108 (90 Base) MCG/ACT inhaler Inhale 2 puffs into the lungs every 4 (four) hours as needed for wheezing or shortness of breath. 12/25/16   Alfonse Spruce, MD  ALPRAZolam Prudy Feeler) 0.25 MG tablet Take 0.25 mg by mouth daily as needed (travel).    [provider]  amLODipine (NORVASC) 2.5 MG tablet Take 2.5 mg by mouth daily. 10/28/21   [provider]  atorvastatin (LIPITOR) 10 MG tablet Take 10 mg by mouth at bedtime.    [provider]  azelastine (ASTELIN) 0.1 % nasal spray Place 1 spray into both nostrils daily. 11/22/19   [provider]  cetirizine (ZYRTEC) 10 MG tablet Take 10 mg by mouth at bedtime.    [provider]  FLOVENT HFA 110 MCG/ACT inhaler INHALE 1 PUFF BY MOUTH INTO THE LUNGS TWICE DAILY Patient taking differently: Inhale 2 puffs into the lungs 2 (two) times daily as needed (shortness of breath). 02/25/18   Alfonse Spruce, MD  fluticasone [facility]) 50  MCG/ACT nasal spray Place 1 spray into both nostrils daily.    [provider]  hydrocortisone (ANUSOL-HC) 2.5 % rectal cream Place 1 application rectally as needed for hemorrhoids or itching.    [provider]  ibuprofen (ADVIL) 200 MG tablet Take 200 mg by mouth every 6 (six) hours as needed for moderate pain.    [provider]  meloxicam (MOBIC) 15 MG tablet Take 1 tablet (15 mg total) by mouth daily. 10/30/22   Richardean Sale, DO  mometasone (NASONEX) 50 MCG/ACT nasal spray Place 2 sprays into the nose as needed.    [provider]  omeprazole (PRILOSEC) 20 MG capsule Take 20 mg by mouth daily. 04/14/22   [provider]  OVER THE COUNTER MEDICATION Take 2 capsules by mouth daily. MEGASPOREBIOTIC    [provider]  psyllium (METAMUCIL) 58.6 % packet Take 1 packet by mouth daily.    [provider]  RESTASIS 0.05 % ophthalmic emulsion Place 1 drop into both eyes 2 (two) times daily. 11/25/21   [provider]  tamoxifen (NOLVADEX) 20 MG tablet Take 1 tablet (20 mg total) by mouth daily. Begin taking once active after surgery 06/26/22   Warner Mccreedy D, NP  triamcinolone (KENALOG) 0.1 % Apply 1 Application topically daily as needed (psoriasis). 05/24/20   [provider]  valACYclovir (VALTREX) 1000 MG tablet Take 1 tablet (  1,000 mg total) by mouth 2 (two) times daily. Take for 1 day as directed above as needed. Patient taking differently: Take 1,000 mg by mouth 2 (two) times daily as needed (fever blisters). 07/02/21   Patton Salles, MD  valsartan-hydrochlorothiazide (DIOVAN-HCT) 160-12.5 MG tablet Take 1 tablet by mouth daily. 02/11/22   [provider]  venlafaxine XR (EFFEXOR-XR) 75 MG 24 hr capsule TAKE 1 CAPSULE(75 MG) BY MOUTH DAILY WITH BREAKFAST 09/26/22   Serena Croissant, MD  VITAMIN D PO Take 2,000 Units by mouth daily.    [provider]      Allergies    Bacitracin,  Hydrocodone, Other, Penicillins, Adhesive [tape], Augmentin [amoxicillin-pot clavulanate], and Penicillamine    Review of Systems   Review of Systems  Musculoskeletal:  Positive for arthralgias.  All other systems reviewed and are negative.   Physical Exam Updated Vital Signs BP (!) 158/83 (BP Location: Right Arm)   Pulse 77   Temp 98.3 F (36.8 C)   Resp 18   Ht  (1.626 m)   Wt 72.1 kg   LMP 07/28/1996 (Approximate)   SpO2 99%   BMI 27.29 kg/m  Physical Exam Vitals and nursing note reviewed.  Constitutional:      Appearance: Normal appearance.  HENT:     Head: Normocephalic and atraumatic.  Eyes:     Conjunctiva/sclera: Conjunctivae normal.  Neck:     Comments: Midline spinal tenderness, step-offs or crepitus Cardiovascular:     Pulses:          Radial pulses are 2+ on the right side and 2+ on the left side.  Pulmonary:     Effort: Pulmonary effort is normal. No respiratory distress.  Musculoskeletal:     Comments: Bruising noted to the dorsal left elbow, with some generalized swelling.  Pain with elbow extension.  Strong grip strength bilaterally.  Normal sensation.  Skin:    General: Skin is warm and dry.  Neurological:     Mental Status: She is alert.  Psychiatric:        Mood and Affect: Mood normal.        Behavior: Behavior normal.     ED Results / Procedures / Treatments   Labs (all labs ordered are listed, but only abnormal results are displayed) Labs Reviewed - No data to display  EKG None  Radiology DG Elbow Complete Left  Result Date: 11/15/2022 CLINICAL DATA:  Fall, left elbow injury EXAM: LEFT ELBOW - COMPLETE 3+ VIEW COMPARISON:  None Available. FINDINGS: Acute transverse extra-articular fracture of the left radial neck with minimal widening of the radial joint space. No dislocation. Mild ulnar humeral degenerative arthritis. Soft tissues are unremarkable. IMPRESSION: 1. Acute transverse extra-articular fracture of the left radial neck.  Electronically Signed   By: Helyn Numbers M.D.   On: 11/15/2022 19:57   DG Forearm Left  Result Date: 11/15/2022 CLINICAL DATA:  Fall, left arm injury EXAM: LEFT FOREARM - 2 VIEW COMPARISON:  None Available. FINDINGS: There is an acute transverse fracture of the left radial neck, not optimally profiled on this examination. Normal alignment. No dislocation. Osseous structures are mildly osteopenic. IMPRESSION: 1. Acute transverse fracture of the left radial neck. Electronically Signed   By: Helyn Numbers M.D.   On: 11/15/2022 19:55   DG Shoulder Left  Result Date: 11/15/2022 CLINICAL DATA:  Fall, left shoulder injury EXAM: LEFT SHOULDER - 2+ VIEW COMPARISON:  None Available. FINDINGS: Normal alignment. No acute fracture or dislocation.  Acromioclavicular and glenohumeral degenerative changes are noted. Limited evaluation of the left hemithorax is unremarkable. IMPRESSION: 1. Degenerative change. No acute fracture or dislocation. Electronically Signed   By: Helyn Numbers M.D.   On: 11/15/2022 19:54    Procedures Procedures    Medications Ordered in ED Medications - No data to display  ED Course/ Medical Decision Making/ A&P                             Medical Decision Making Amount and/or Complexity of Data Reviewed Radiology: ordered.   This patient is a 72 y.o. female  who presents to the ED for concern of injuries after fall. Head trauma, but no LOC, not on blood thinners.   Past Medical History / Co-morbidities: osteopenia, GERD, breast cancer, hypertension, OSA, CAD, emphysema  Physical Exam: Physical exam performed. The pertinent findings include: Mildly hypertensive, otherwise normal vital signs.  No acute distress.  Elbow held in passive flexion.  Pain with left elbow extension.  Normal grip strength bilaterally.  Neurovascular intact in bilateral upper extremities.  Lab Tests/Imaging studies: I personally interpreted labs/imaging and the pertinent results include: X-rays  ordered from triage including left elbow, forearm, and shoulder.  X-rays show transverse fracture at the proximal radial neck. I agree with the radiologist interpretation.  Treatment: Patient placed in double sugar tong splint and given sling   Disposition: After consideration of the diagnostic results and the patients response to treatment, I feel that emergency department workup does not suggest an emergent condition requiring admission or immediate intervention beyond what has been performed at this time. The plan is: Discharged home left proximal radius fracture.  Provided splint and sling.  Patient plans to follow-up with her orthopedist.. The patient is safe for discharge and has been instructed to return immediately for worsening symptoms, change in symptoms or any other concerns.  Final Clinical Impression(s) / ED Diagnoses Final diagnoses:  Closed fracture of proximal end of radius without additional fracture    Rx / DC Orders ED Discharge Orders     None      Portions of this report may have been transcribed using voice recognition software. Every effort was made to ensure accuracy; however, inadvertent computerized transcription errors may be present.    Jeanella Flattery 11/15/22 2131    Maia Plan, MD 11/16/22 1126

## 2022-11-19 NOTE — Progress Notes (Unsigned)
Aleen Sells D.[name] [address] [place] [address] Phone: [phone]   Assessment and Plan:     There are no diagnoses linked to this encounter.  ***   Pertinent previous records reviewed include ***   Follow Up: ***     Subjective:   I, Korby Ratay, am serving as a Neurosurgeon for Doctor Richardean Sale   Chief Complaint: piriformis pain    HPI:    10/30/2022 Patient is a 72 year old female complaining of piriformis pain. Patient states that she had a hysterectomy in November , she had a challenging operation, hard recovery, had a toe fusion in January, she spent 4 months on her couch , right side back pain, she played gold Monday and Tuesday and she is locked up, pain is radiating up her back from the glute to the low back , she wants something to calm down the pain , no numbness or tingling,     11/20/2022 Patient states    Relevant Historical Information: Recent hysterectomy, recent first toe fusion on right, history of breast cancer  Additional pertinent review of systems negative.   Current Outpatient Medications:    acetaminophen (TYLENOL) 500 MG tablet, Take 500 mg by mouth every 6 (six) hours as needed for moderate pain., Disp: , Rfl:    albuterol (PROAIR HFA) 108 (90 Base) MCG/ACT inhaler, Inhale 2 puffs into the lungs every 4 (four) hours as needed for wheezing or shortness of breath., Disp: 1 Inhaler, Rfl: 1   ALPRAZolam (XANAX) 0.25 MG tablet, Take 0.25 mg by mouth daily as needed (travel)., Disp: , Rfl:    amLODipine (NORVASC) 2.5 MG tablet, Take 2.5 mg by mouth daily., Disp: , Rfl:    atorvastatin (LIPITOR) 10 MG tablet, Take 10 mg by mouth at bedtime., Disp: , Rfl:    azelastine (ASTELIN) 0.1 % nasal spray, Place 1 spray into both nostrils daily., Disp: , Rfl:    cetirizine (ZYRTEC) 10 MG tablet, Take 10 mg by mouth at bedtime., Disp: , Rfl:    FLOVENT HFA 110 MCG/ACT inhaler, INHALE 1 PUFF BY MOUTH INTO THE  LUNGS TWICE DAILY (Patient taking differently: Inhale 2 puffs into the lungs 2 (two) times daily as needed (shortness of breath).), Disp: 12 g, Rfl: 0   fluticasone (FLONASE) 50 MCG/ACT nasal spray, Place 1 spray into both nostrils daily., Disp: , Rfl:    hydrocortisone (ANUSOL-HC) 2.5 % rectal cream, Place 1 application rectally as needed for hemorrhoids or itching., Disp: , Rfl:    ibuprofen (ADVIL) 200 MG tablet, Take 200 mg by mouth every 6 (six) hours as needed for moderate pain., Disp: , Rfl:    meloxicam (MOBIC) 15 MG tablet, Take 1 tablet (15 mg total) by mouth daily., Disp: 14 tablet, Rfl: 0   mometasone (NASONEX) 50 MCG/ACT nasal spray, Place 2 sprays into the nose as needed., Disp: , Rfl:    omeprazole (PRILOSEC) 20 MG capsule, Take 20 mg by mouth daily., Disp: , Rfl:    OVER THE COUNTER MEDICATION, Take 2 capsules by mouth daily. MEGASPOREBIOTIC, Disp: , Rfl:    psyllium (METAMUCIL) 58.6 % packet, Take 1 packet by mouth daily., Disp: , Rfl:    RESTASIS 0.05 % ophthalmic emulsion, Place 1 drop into both eyes 2 (two) times daily., Disp: , Rfl:    tamoxifen (NOLVADEX) 20 MG tablet, Take 1 tablet (20 mg total) by mouth daily. Begin taking once active after surgery, Disp: 90 tablet,  Rfl: 3   triamcinolone (KENALOG) 0.1 %, Apply 1 Application topically daily as needed (psoriasis)., Disp: , Rfl:    valACYclovir (VALTREX) 1000 MG tablet, Take 1 tablet (1,000 mg total) by mouth 2 (two) times daily. Take for 1 day as directed above as needed. (Patient taking differently: Take 1,000 mg by mouth 2 (two) times daily as needed (fever blisters).), Disp: 30 tablet, Rfl: 1   valsartan-hydrochlorothiazide (DIOVAN-HCT) 160-12.5 MG tablet, Take 1 tablet by mouth daily., Disp: , Rfl:    venlafaxine XR (EFFEXOR-XR) 75 MG 24 hr capsule, TAKE 1 CAPSULE(75 MG) BY MOUTH DAILY WITH BREAKFAST, Disp: 90 capsule, Rfl: 4   VITAMIN D PO, Take 2,000 Units by mouth daily., Disp: , Rfl:    Objective:     There were no  vitals filed for this visit.    There is no height or weight on file to calculate BMI.    Physical Exam:    ***   Electronically signed by:  Aleen Sells D.[name] 12:12 PM 11/19/22

## 2022-11-20 ENCOUNTER — Ambulatory Visit: Payer: Medicare PPO | Admitting: Sports Medicine

## 2022-11-20 VITALS — HR 84 | Ht 64.0 in | Wt 159.0 lb

## 2022-11-20 DIAGNOSIS — S76019A Strain of muscle, fascia and tendon of unspecified hip, initial encounter: Secondary | ICD-10-CM | POA: Diagnosis not present

## 2022-11-20 NOTE — Patient Instructions (Signed)
Good to see you   

## 2022-11-24 DIAGNOSIS — M25522 Pain in left elbow: Secondary | ICD-10-CM | POA: Diagnosis not present

## 2022-11-24 DIAGNOSIS — M25532 Pain in left wrist: Secondary | ICD-10-CM | POA: Diagnosis not present

## 2022-12-03 ENCOUNTER — Encounter: Payer: Self-pay | Admitting: Sports Medicine

## 2022-12-03 ENCOUNTER — Other Ambulatory Visit: Payer: Self-pay | Admitting: Sports Medicine

## 2022-12-03 DIAGNOSIS — M24042 Loose body in left finger joint(s): Secondary | ICD-10-CM

## 2022-12-03 DIAGNOSIS — M79642 Pain in left hand: Secondary | ICD-10-CM

## 2022-12-03 NOTE — Progress Notes (Unsigned)
Pt referral placed

## 2022-12-09 ENCOUNTER — Encounter: Payer: Self-pay | Admitting: Occupational Therapy

## 2022-12-09 ENCOUNTER — Ambulatory Visit: Payer: Medicare PPO | Attending: Sports Medicine | Admitting: Occupational Therapy

## 2022-12-09 DIAGNOSIS — M79642 Pain in left hand: Secondary | ICD-10-CM | POA: Insufficient documentation

## 2022-12-09 DIAGNOSIS — M24042 Loose body in left finger joint(s): Secondary | ICD-10-CM | POA: Diagnosis not present

## 2022-12-09 DIAGNOSIS — M25622 Stiffness of left elbow, not elsewhere classified: Secondary | ICD-10-CM | POA: Insufficient documentation

## 2022-12-09 DIAGNOSIS — M25522 Pain in left elbow: Secondary | ICD-10-CM | POA: Diagnosis not present

## 2022-12-09 DIAGNOSIS — M6281 Muscle weakness (generalized): Secondary | ICD-10-CM | POA: Diagnosis not present

## 2022-12-09 NOTE — Therapy (Signed)
OUTPATIENT OCCUPATIONAL THERAPY ORTHO EVALUATION  Patient Name: Sabrina Tapia MRN: [id number] DOB:May 27, 1951, 72 y.o., female Today's Date: 12/09/2022  PCP: Emilio Aspen, MD REFERRING PROVIDER: Richardean Sale, DO  END OF SESSION:  OT End of Session - 12/09/22 1317     Visit Number 1    Number of Visits 12    Date for OT Re-Evaluation 01/16/23    Authorization Type [employer] MCR    Progress Note Due on Visit 10    OT Start Time 1232    OT Stop Time 1315    OT Time Calculation (min) 43 min    Activity Tolerance Patient tolerated treatment well    Behavior During Therapy WFL for tasks assessed/performed             Past Medical History:  Diagnosis Date   Allergy-induced asthma    prn inhaler   Arterial calcification    sees pcp for takes atorvastatin  for   CAD (coronary artery disease)    Constipation    Emphysema of lung (HCC)    Endometrial thickening on ultrasound    Environmental and seasonal allergies    Family history of adverse reaction to anesthesia    pt's father has hx. of being hard to wake up post-op x 1 time after 8 hour whipple surgery   GERD (gastroesophageal reflux disease)    Hemorrhoids    Hisory of Migraines    History of basal cell carcinoma (BCC) excision    right clavicle   History of breast cancer 2017   bilateral   History of radiation therapy    08/19/16- 09/29/16, Right Breast 50 Gy in 25 fractions, Right Breast boost 10 Gy in 5 fractions, Left Breast 50.4 Gy in 28 fractions   Hypertension    OSA (obstructive sleep apnea)    uses dental appliance mild /moderate osa   Osteoarthritis    hands and feet   Osteopenia 02/2016   T score -1.3 FRAX 8.3%/0.7% stable from prior DEXA   Overactive bladder    Stroke [facility])    Thyroid nodule 2012   Solitary nodule in the lower pole of the left thyroid lobe , pt unaware   Wears glasses for reading    Wears hearing aid in both ears    Past Surgical History:  Procedure Laterality Date    BREAST RECONSTRUCTION WITH PLACEMENT OF TISSUE EXPANDER AND FLEX HD (ACELLULAR HYDRATED DERMIS) Left 07/01/2016   Procedure: BREAST RECONSTRUCTION WITH PLACEMENT OF TISSUE EXPANDER AND  ALLODERM;  Surgeon: Glenna Fellows, MD;  Location: [facility];  Service: Plastics;  Laterality: Left;   BROW LIFT     COLONOSCOPY  2022   DILATATION & CURETTAGE/HYSTEROSCOPY WITH MYOSURE N/A 12/06/2018   Procedure: DILATATION & CURETTAGE/HYSTEROSCOPY WITH MYOSURE;  Surgeon: Patton Salles, MD;  Location: [facility];  Service: Gynecology;  Laterality: N/A;  follow 1st case   DILATATION & CURETTAGE/HYSTEROSCOPY WITH MYOSURE N/A 04/21/2022   Procedure: DILATATION & CURETTAGE/HYSTEROSCOPY;  Surgeon: Patton Salles, MD;  Location: [facility];  Service: Gynecology;  Laterality: N/A;   INCISION AND DRAINAGE Left 06/02/2016   Procedure: DRAINAGE of left axilla seroma;  Surgeon: Emelia Loron, MD;  Location: [facility];  Service: General;  Laterality: Left;   LIPOSUCTION WITH LIPOFILLING Left 06/11/2018   Procedure: Lipofilling from abdomen to left chest;  Surgeon: Glenna Fellows, MD;  Location: [facility];  Service: Plastics;  Laterality: Left;  MASTOPEXY Right 04/17/2017   Procedure: RIGHT MASTOPEXY;  Surgeon: Glenna Fellows, MD;  Location: [facility];  Service: Plastics;  Laterality: Right;   NIPPLE SPARING MASTECTOMY Left 07/01/2016   Procedure: LEFT NIPPLE SPARING MASTECTOMY;  Surgeon: Emelia Loron, MD;  Location: [facility];  Service: General;  Laterality: Left;   OPERATIVE ULTRASOUND N/A 04/21/2022   Procedure: OPERATIVE ULTRASOUND;  Surgeon: Patton Salles, MD;  Location: [facility];  Service: Gynecology;  Laterality: N/A;   RADIOACTIVE SEED GUIDED PARTIAL MASTECTOMY WITH AXILLARY SENTINEL LYMPH NODE BIOPSY Right 03/26/2016   Procedure: RIGHT  BREAST LUMPECTOMY WITH RADIOACTIVE SEED AND SENTINEL LYMPH NODE BIOPSY;  Surgeon: Emelia Loron, MD;  Location: [facility];  Service: General;  Laterality: Right;   RADIOACTIVE SEED GUIDED PARTIAL MASTECTOMY WITH AXILLARY SENTINEL LYMPH NODE BIOPSY Left 05/20/2016   Procedure: LEFT BREAST RADIOACTIVE SEED (two seeds) GUIDED LUMPECTOMY WITH LEFT AXILLARY SENTINEL LYMPH NODE BIOPSY AND LEFT SEED TARGETED AXILLARY LYMPH NODE EXCISION;  Surgeon: Emelia Loron, MD;  Location: [facility];  Service: General;  Laterality: Left;   RE-EXCISION OF BREAST CANCER,SUPERIOR MARGINS Right 05/20/2016   Procedure: RE-EXCISION OF RIGHT BREAST MARGIN;  Surgeon: Emelia Loron, MD;  Location: [facility];  Service: General;  Laterality: Right;   RE-EXCISION OF BREAST CANCER,SUPERIOR MARGINS Left 06/02/2016   Procedure: RE-EXCISION OF BREAST CANCER,SUPERIOR MARGINS;  Surgeon: Emelia Loron, MD;  Location: [facility];  Service: General;  Laterality: Left;   REMOVAL OF TISSUE EXPANDER AND PLACEMENT OF IMPLANT Left 04/17/2017   Procedure: REMOVAL OF LEFT TISSUE EXPANDER WITH PLACEMENT OF LEFT SILICONE BREAST IMPLANT;  Surgeon: Glenna Fellows, MD;  Location:  SUR[facility]ERY [facility];  Service: Plastics;  Laterality: Left;   ROBOTIC ASSISTED LAPAROSCOPIC LYSIS OF ADHESION N/A 06/26/2022   Procedure: XI ROBOTIC ASSISTED LAPAROSCOPIC LYSIS OF ADHESION;  Surgeon: Carver Fila, MD;  Location: [facility];  Service: Gynecology;  Laterality: N/A;   ROBOTIC ASSISTED TOTAL HYSTERECTOMY WITH BILATERAL SALPINGO OOPHERECTOMY N/A 06/26/2022   Procedure: XI ROBOTIC ASSISTED TOTAL HYSTERECTOMY WITH BILATERAL SALPINGO OOPHORECTOMY, MINI LAPAROTOMY;  Surgeon: Carver Fila, MD;  Location: [facility];  Service: Gynecology;  Laterality: N/A;   shoulder surgery for arthritis Left 07/2021   arthroscopic done at [facility] [place]   Patient Active Problem  List   Diagnosis Date Noted   Pelvic adhesions 06/26/2022   Degenerative tear of medial meniscus 01/23/2022   Greater trochanteric bursitis of right hip 06/11/2021   Arthritis of carpometacarpal [facility]) joint of left thumb 06/11/2021   Mass of right lung 07/13/2020   Arthritis 08/03/2019   Rosacea 08/03/2019   Pes anserinus bursitis of right knee 03/28/2019   Pain in right knee 03/22/2019   Thickened endometrium 10/24/2018   Adhesive capsulitis of right shoulder 10/18/2018   Pain in right hand 10/05/2018   Tear of biceps tendon 09/21/2018   Moderate persistent asthma, uncomplicated 06/29/2017   Hallux rigidus of right foot 06/15/2017   Left Achilles tendinitis 06/15/2017   History of bilateral breast cancer 04/23/2017   Mild persistent asthma, uncomplicated 02/12/2017   Other seasonal allergic rhinitis 02/12/2017   History of therapeutic radiation 02/11/2017   Malignant neoplasm of overlapping sites of left breast in female, estrogen receptor positive (HCC) 10/27/2016   Sinusitis 09/01/2016   Hyponatremia 09/01/2016   Acquired absence of left breast 07/16/2016   Malignant neoplasm of upper-outer quadrant of right breast in female, estrogen receptor positive (  HCC) 04/19/2016   Dizziness after extension of neck 02/19/2016   Sensorineural hearing loss (SNHL), bilateral 02/19/2016   Tinnitus of both ears 02/19/2016   Bunion of great toe of right foot 04/04/2015   Hemorrhoid 11/23/2014   Anal skin tag 11/23/2014   Incontinence of urine 07/28/2014   Retrocalcaneal bursitis 05/11/2014   Pain in joint, ankle and foot 05/11/2014   Heart burn    Joint pain    Abnormal Pap smear    Osteopenia     ONSET DATE: 12/03/2022 (referral date)  Pt presented to ED s/p fall on 11/15/22   REFERRING DIAG:  Z61.096 (ICD-10-CM) - Left hand pain  M24.042 (ICD-10-CM) - Loose body in joint of left hand  **ED notes show diagnosis: Closed fracture of proximal end of radius  THERAPY DIAG:  Stiffness  of left elbow, not elsewhere classified  Pain in left elbow  Muscle weakness (generalized)  Rationale for Evaluation and Treatment: Rehabilitation  SUBJECTIVE:   SUBJECTIVE STATEMENT: I fell on 11/15/22. Splinted me for 9 days Pt accompanied by: self  PERTINENT HISTORY: history of osteopenia, GERD, breast cancer, hypertension, OSA, CAD, emphysema, recent hysterectomy 05/2022, toe surgery 07/2022  PRECAUTIONS: Other: TBD by orthopedist on 12/10/22  WEIGHT BEARING RESTRICTIONS:  NWB LUE currently  PAIN:  Are you having pain? Yes: NPRS scale: 0-3/10 Pain location: Lt elbow Pain description: achy Aggravating factors: movement Relieving factors: rest  FALLS: Has patient fallen in last 6 months? Yes. Number of falls 1  LIVING ENVIRONMENT: Lives with: lives alone Lives in: Other townhome w/ 2 steps to enter w/ railings , lives on first floor Has following equipment at home: None  PLOF: Independent, Vocation/Vocational requirements: retired, and Leisure: golf  PATIENT GOALS: get ROM better so I can golf  NEXT MD VISIT: 12/10/22  OBJECTIVE:   HAND DOMINANCE: Right  ADLs: Eating: independent Grooming: independent UB Dressing: independent  LB Dressing: independent Toileting: independent Bathing: independent  Tub Shower transfers: independent **difficulty opening jars/containers  Pt cooks w/ adaptations, has cleaning lady to clean house  UPPER EXTREMITY ROM:     Active ROM Right eval Left eval  Shoulder flexion    Shoulder abduction    Shoulder adduction    Shoulder extension    Shoulder internal rotation    Shoulder external rotation    Elbow flexion  135  Elbow extension  -24  Wrist flexion  WFLS  Wrist extension  WFLS  Wrist ulnar deviation    Wrist radial deviation    Wrist pronation  45  Wrist supination  50  (Blank rows = not tested)    UPPER EXTREMITY MMT:   Not tested d/t precautions  HAND FUNCTION: Grip strength: Right: 57.3 lbs; Left: 20.3  lbs (pain at [facility] joint Lt thumb - premorbid)   COORDINATION: 9 Hole Peg test: Right: 18 sec; Left: 20 sec  SENSATION: Previous mild deficits LUE from breast implant  EDEMA: mild Lt elbow  COGNITION: Overall cognitive status: Within functional limits for tasks assessed  OBSERVATIONS: Pleasant, limited ROM in elbow flex/ex, FA sup/pron, mild pain    TODAY'S TREATMENT:  Pt issued A/ROM HEP for elbow flex, ext, FA sup and pronation - see pt instructions for details. Pt demo each x 10  Note sent via patient to Dr. Amanda Pea to clarify precautions (? P/ROM and strengthening LUE) as pt sees him tomorrow 12/10/22  PATIENT EDUCATION: Education details: A/ROM HEP for Lt elbow and forearm Person educated: Patient Education method: Programmer, multimedia, Facilities manager, Verbal cues, and Handouts Education comprehension: verbalized understanding, returned demonstration, and verbal cues required  HOME EXERCISE PROGRAM: 12/09/22: A/ROM HEP for Lt elbow and forearm  GOALS: Goals reviewed with patient? Yes  SHORT TERM GOALS: Target date: 01/02/23  Independent with initial ROM HEP for Lt elbow and forearm Baseline: Goal status: IN PROGRESS  2.  Pt to improve Lt elbow flex to 140* or greater, elbow ext to -18* or better Baseline: 135*, -24* Goal status: INITIAL  3.  Pt to improve Lt FA supination and pronation by 10* each Baseline: sup = 50*, pron = 45* Goal status: INITIAL  4.  Pt to verbalize understanding to reduce Lt thumb CMC joint pain and improve stability including: HEP, task modifications and/or AE, and splinting prn Baseline:  Goal status: INITIAL    LONG TERM GOALS: Target date: 01/16/23  Independent with updated strengthening HEP Baseline:  Goal status: INITIAL  2.  Improve grip strength Lt hand by 10 lbs or greater Baseline: 20 lbs Goal status:  INITIAL  3.  Improve Lt elbow ext to -10* or better for functional reaching Baseline: -24* Goal status: INITIAL  4.  Improve FA supination and pronation each by 20* or greater Baseline: sup = 50*, pron = 45*  Goal status: INITIAL  5.  Pt to return to using LUE as assist for all bilateral tasks including golfing Baseline:  Goal status: INITIAL   ASSESSMENT:  CLINICAL IMPRESSION: Patient is a 72 y.o. female who was seen today for occupational therapy evaluation s/p fall on 11/15/22 w/ closed fracture of proximal radius (radial neck fx per pt report). Pt was treated conservatively (no surgery) and splinted for 9 days. Pt with residual deficits in elbow and forearm ROM, strength, and pain. Pt would benefit from O.T. to address these deficits, and return pt to PLOF including golfing.   PERFORMANCE DEFICITS: in functional skills including IADLs, edema, ROM, strength, pain, flexibility, Gross motor control, body mechanics, decreased knowledge of use of DME, and UE functional use,  IMPAIRMENTS: are limiting patient from IADLs and leisure.   COMORBIDITIES: may have co-morbidities  that affects occupational performance. Patient will benefit from skilled OT to address above impairments and improve overall function.  MODIFICATION OR ASSISTANCE TO COMPLETE EVALUATION: No modification of tasks or assist necessary to complete an evaluation.  OT OCCUPATIONAL PROFILE AND HISTORY: Problem focused assessment: Including review of records relating to presenting problem.  CLINICAL DECISION MAKING: Moderate - several treatment options, min-mod task modification necessary  REHAB POTENTIAL: Good  EVALUATION COMPLEXITY: Low      PLAN:  OT FREQUENCY: 2x/week  OT DURATION: 6 weeks  PLANNED INTERVENTIONS: self care/ADL training, therapeutic exercise, therapeutic activity, manual therapy, passive range of motion, splinting, electrical stimulation, ultrasound, fluidotherapy, compression bandaging,  moist heat, cryotherapy, contrast bath, patient/family education, and DME and/or AE instructions  RECOMMENDED OTHER SERVICES: none  CONSULTED AND AGREED WITH PLAN OF CARE: Patient  PLAN FOR NEXT SESSION: review A/ROM HEP, forearm gym, UBE, progress as able   Sheran Lawless, OT 12/09/2022, 1:18 PM

## 2022-12-09 NOTE — Patient Instructions (Signed)
AROM: Elbow Flexion / Extension    With left hand palm up, gently bend elbow as far as possible. Then straighten arm as far as possible. Repeat __10__ times per set.  Do __6__ sessions per day.  Elbow Extension: Resisted    Lie on back, __NO__ pound weight in left hand, arm up, elbow bent and supported. Straighten elbow. Return slowly. Repeat __10__ times per set.  Do __6__ sessions per day.   Combination Movement (Active)    Keep elbow firmly bent at side with wrist straight. Turn palm up slowly and then turn palm down slowly. Repeat __10__ times. Do __6__ sessions per day.

## 2022-12-10 DIAGNOSIS — S52102A Unspecified fracture of upper end of left radius, initial encounter for closed fracture: Secondary | ICD-10-CM | POA: Diagnosis not present

## 2022-12-10 DIAGNOSIS — M13842 Other specified arthritis, left hand: Secondary | ICD-10-CM | POA: Diagnosis not present

## 2022-12-10 DIAGNOSIS — M67432 Ganglion, left wrist: Secondary | ICD-10-CM | POA: Diagnosis not present

## 2022-12-23 NOTE — Progress Notes (Signed)
Aleen Sells D.[name] [address] [place] [address] Phone: [phone]   Assessment and Plan:     1. Arthritis of carpometacarpal (CMC) joint of left thumb -Chronic with exacerbation, subsequent visit - Recurrent flare of pain most consistent with left CMC arthritis - Patient has received mild relief from [facility] CSI in the past, however she states that they typically are of limited benefit, so decided to not repeat injection today - Recommend using warm hand baths, topical Voltaren gel, thumb splint as needed for day-to-day pain relief - Start Tylenol 500 to 1000 mg tablets 2-3 times a day for day-to-day pain relief  2. Neck pain  -Subacute, initial visit - Worsening neck stiffness without radicular symptoms most consistent with compensation from patient's recovery from left-sided radial head fracture - Continue physical therapy for elbow and start PT for neck.  Referral sent.  Pertinent previous records reviewed include physical therapy note 12/29/2022, physical therapy note 11/2922, physical therapy note 12/09/2022   Follow Up: As needed if no improvement or worsening of symptoms   Subjective:   I, Jerene Canny, am serving as a Neurosurgeon for Doctor Richardean Sale   Chief Complaint: piriformis pain    HPI:    10/30/2022 Patient is a 72 year old female complaining of piriformis pain. Patient states that she had a hysterectomy in November , she had a challenging operation, hard recovery, had a toe fusion in January, she spent 4 months on her couch , right side back pain, she played golf Monday and Tuesday and she is locked up, pain is radiating up her back from the glute to the low back , she wants something to calm down the pain , no numbness or tingling,      11/20/2022 Patient states enjoys the meloxicam and stretch combo , would like to see her xrays frm her fall , butts good though   12/29/2022 Patient states elbow is moving well , is  seeing [facility] in a week has had a lot of bone growth and is healing well, low back is great since she hasn't played golf , states she has had some pinching sensation in her neck and  decreased ROM    Relevant Historical Information: Recent hysterectomy, recent first toe fusion on right, history of breast cancer  Additional pertinent review of systems negative.   Current Outpatient Medications:    acetaminophen (TYLENOL) 500 MG tablet, Take 500 mg by mouth every 6 (six) hours as needed for moderate pain., Disp: , Rfl:    albuterol (PROAIR HFA) 108 (90 Base) MCG/ACT inhaler, Inhale 2 puffs into the lungs every 4 (four) hours as needed for wheezing or shortness of breath., Disp: 1 Inhaler, Rfl: 1   ALPRAZolam (XANAX) 0.25 MG tablet, Take 0.25 mg by mouth daily as needed (travel)., Disp: , Rfl:    amLODipine (NORVASC) 2.5 MG tablet, Take 2.5 mg by mouth daily., Disp: , Rfl:    atorvastatin (LIPITOR) 10 MG tablet, Take 10 mg by mouth at bedtime., Disp: , Rfl:    azelastine (ASTELIN) 0.1 % nasal spray, Place 1 spray into both nostrils daily., Disp: , Rfl:    cetirizine (ZYRTEC) 10 MG tablet, Take 10 mg by mouth at bedtime., Disp: , Rfl:    FLOVENT HFA 110 MCG/ACT inhaler, INHALE 1 PUFF BY MOUTH INTO THE LUNGS TWICE DAILY (Patient taking differently: Inhale 2 puffs into the lungs 2 (two) times daily as needed (shortness of breath).), Disp: 12 g,  Rfl: 0   fluticasone (FLONASE) 50 MCG/ACT nasal spray, Place 1 spray into both nostrils daily., Disp: , Rfl:    hydrocortisone (ANUSOL-HC) 2.5 % rectal cream, Place 1 application rectally as needed for hemorrhoids or itching., Disp: , Rfl:    ibuprofen (ADVIL) 200 MG tablet, Take 200 mg by mouth every 6 (six) hours as needed for moderate pain., Disp: , Rfl:    meloxicam (MOBIC) 15 MG tablet, Take 1 tablet (15 mg total) by mouth daily., Disp: 14 tablet, Rfl: 0   mometasone (NASONEX) 50 MCG/ACT nasal spray, Place 2 sprays into the nose as needed., Disp: , Rfl:     omeprazole (PRILOSEC) 20 MG capsule, Take 20 mg by mouth daily., Disp: , Rfl:    OVER THE COUNTER MEDICATION, Take 2 capsules by mouth daily. MEGASPOREBIOTIC, Disp: , Rfl:    psyllium (METAMUCIL) 58.6 % packet, Take 1 packet by mouth daily., Disp: , Rfl:    RESTASIS 0.05 % ophthalmic emulsion, Place 1 drop into both eyes 2 (two) times daily., Disp: , Rfl:    tamoxifen (NOLVADEX) 20 MG tablet, Take 1 tablet (20 mg total) by mouth daily. Begin taking once active after surgery, Disp: 90 tablet, Rfl: 3   triamcinolone (KENALOG) 0.1 %, Apply 1 Application topically daily as needed (psoriasis)., Disp: , Rfl:    valACYclovir (VALTREX) 1000 MG tablet, Take 1 tablet (1,000 mg total) by mouth 2 (two) times daily. Take for 1 day as directed above as needed. (Patient taking differently: Take 1,000 mg by mouth 2 (two) times daily as needed (fever blisters).), Disp: 30 tablet, Rfl: 1   valsartan-hydrochlorothiazide (DIOVAN-HCT) 160-12.5 MG tablet, Take 1 tablet by mouth daily., Disp: , Rfl:    venlafaxine XR (EFFEXOR-XR) 75 MG 24 hr capsule, TAKE 1 CAPSULE(75 MG) BY MOUTH DAILY WITH BREAKFAST, Disp: 90 capsule, Rfl: 4   VITAMIN D PO, Take 2,000 Units by mouth daily., Disp: , Rfl:    Objective:     Vitals:   12/29/22 1516  Pulse: 75  SpO2: 97%  Weight: 162 lb (73.5 kg)  Height: 5\' 4"  (1.626 m)      Body mass index is 27.81 kg/m.    Physical Exam:    Neck Exam: Cervical Spine- Posture normal Skin- normal, intact  Neuro:  Strength-  Right Left   Deltoid (C5) 5/5 5/5  Bicep/Brachioradialis (C5/6) 5/5  5/5  Wrist Extension (C6) 5/5 5/5  Tricep (C7) 5/5 5/5  Wrist Flexion (C7) 5/5 5/5  Grip (C8) 5/5 5/5  Finger Abduction (T1) 5/5 5/5   Sensation: intact to light touch in upper extremities bilaterally  Spurling's:  negative bilaterally Neck ROM: Full active ROM TTP: Left-sided cervical paraspinal NTTP: cervical spinous processes, right cervical paraspinal, thoracic paraspinal,  trapezius   Right hand/ Wrist:   No deformity or swelling appreciated. TTP right [facility] Patient wearing thumb brace  Electronically signed by:  Aleen Sells D.[name] 3:31 PM 12/29/22

## 2022-12-24 ENCOUNTER — Ambulatory Visit: Payer: Medicare PPO | Admitting: Occupational Therapy

## 2022-12-24 ENCOUNTER — Encounter: Payer: Self-pay | Admitting: Occupational Therapy

## 2022-12-24 DIAGNOSIS — M25522 Pain in left elbow: Secondary | ICD-10-CM

## 2022-12-24 DIAGNOSIS — M79642 Pain in left hand: Secondary | ICD-10-CM | POA: Diagnosis not present

## 2022-12-24 DIAGNOSIS — M25622 Stiffness of left elbow, not elsewhere classified: Secondary | ICD-10-CM

## 2022-12-24 DIAGNOSIS — M24042 Loose body in left finger joint(s): Secondary | ICD-10-CM | POA: Diagnosis not present

## 2022-12-24 DIAGNOSIS — M6281 Muscle weakness (generalized): Secondary | ICD-10-CM

## 2022-12-24 NOTE — Patient Instructions (Signed)
Flexion (Passive)    Use other hand to bend elbow, with thumb toward same shoulder. Do NOT force this motion. Hold __10__ seconds. Repeat __5__ times. Do _3___ sessions per day.  Extension (Passive)    Place thick telephone book on table and rest upper arm on it. Grasp forearm with other hand and use a steady downward and outward pull to straighten elbow. Hold __10__ seconds. Repeat __5__ times. Do __3__ sessions per day.    Supination (Passive)    Keep elbow bent at right angle and held firmly at side. Use other hand to turn forearm until palm faces upward. Hold _10___ seconds. Repeat __5__ times. Do __3__ sessions per day.  Pronation (Passive)    Keep elbow bent at right angle and held firmly to side. Use other hand to turn forearm until palm faces downward. Hold _10___ seconds. Repeat _5___ times. Do _3___ sessions per day.   Palmar Adduction/Abduction (Active)    Move thumb down, away from palm (in front of palm). Move back to rest along palm. Do NOT hyperextend tip joint Repeat _10___ times. Do _3-6___ sessions per day.  Opposition (Active)    Touch tip of thumb to nail tip of each finger in turn, making an "O" shape. Repeat __5__ times. Do _3-6___ sessions per day.

## 2022-12-24 NOTE — Therapy (Signed)
OUTPATIENT OCCUPATIONAL THERAPY ORTHO TREATMENT  Patient Name: Sabrina Tapia MRN: [id number] DOB:[birth year], 72 y.o., female Today's Date: 12/24/2022  PCP: Emilio Aspen, MD REFERRING PROVIDER: Richardean Sale, DO  END OF SESSION:  OT End of Session - 12/24/22 0941     Visit Number 2    Number of Visits 12    Date for OT Re-Evaluation 01/16/23    Authorization Type [employer] MCR    Progress Note Due on Visit 10    OT Start Time [phone]    OT Stop Time 1020    OT Time Calculation (min) 43 min    Activity Tolerance Patient tolerated treatment well    Behavior During Therapy WFL for tasks assessed/performed             Past Medical History:  Diagnosis Date   Allergy-induced asthma    prn inhaler   Arterial calcification    sees pcp for takes atorvastatin  for   CAD (coronary artery disease)    Constipation    Emphysema of lung (HCC)    Endometrial thickening on ultrasound    Environmental and seasonal allergies    Family history of adverse reaction to anesthesia    pt's father has hx. of being hard to wake up post-op x 1 time after 8 hour whipple surgery   GERD (gastroesophageal reflux disease)    Hemorrhoids    Hisory of Migraines    History of basal cell carcinoma (BCC) excision    right clavicle   History of breast cancer 2017   bilateral   History of radiation therapy    08/19/16- 09/29/16, Right Breast 50 Gy in 25 fractions, Right Breast boost 10 Gy in 5 fractions, Left Breast 50.4 Gy in 28 fractions   Hypertension    OSA (obstructive sleep apnea)    uses dental appliance mild /moderate osa   Osteoarthritis    hands and feet   Osteopenia 02/2016   T score -1.3 FRAX 8.3%/0.7% stable from prior DEXA   Overactive bladder    Stroke [facility])    Thyroid nodule 2012   Solitary nodule in the lower pole of the left thyroid lobe , pt unaware   Wears glasses for reading    Wears hearing aid in both ears    Past Surgical History:  Procedure Laterality Date    BREAST RECONSTRUCTION WITH PLACEMENT OF TISSUE EXPANDER AND FLEX HD (ACELLULAR HYDRATED DERMIS) Left 07/01/2016   Procedure: BREAST RECONSTRUCTION WITH PLACEMENT OF TISSUE EXPANDER AND  ALLODERM;  Surgeon: Glenna Fellows, MD;  Location: [facility];  Service: Plastics;  Laterality: Left;   BROW LIFT     COLONOSCOPY  2022   DILATATION & CURETTAGE/HYSTEROSCOPY WITH MYOSURE N/A 12/06/2018   Procedure: DILATATION & CURETTAGE/HYSTEROSCOPY WITH MYOSURE;  Surgeon: Patton Salles, MD;  Location: [facility];  Service: Gynecology;  Laterality: N/A;  follow 1st case   DILATATION & CURETTAGE/HYSTEROSCOPY WITH MYOSURE N/A 04/21/2022   Procedure: DILATATION & CURETTAGE/HYSTEROSCOPY;  Surgeon: Patton Salles, MD;  Location: [facility];  Service: Gynecology;  Laterality: N/A;   INCISION AND DRAINAGE Left 06/02/2016   Procedure: DRAINAGE of left axilla seroma;  Surgeon: Emelia Loron, MD;  Location: [facility];  Service: General;  Laterality: Left;   LIPOSUCTION WITH LIPOFILLING Left 06/11/2018   Procedure: Lipofilling from abdomen to left chest;  Surgeon: Glenna Fellows, MD;  Location: [facility];  Service: Plastics;  Laterality: Left;  MASTOPEXY Right 04/17/2017   Procedure: RIGHT MASTOPEXY;  Surgeon: Glenna Fellows, MD;  Location: [facility];  Service: Plastics;  Laterality: Right;   NIPPLE SPARING MASTECTOMY Left 07/01/2016   Procedure: LEFT NIPPLE SPARING MASTECTOMY;  Surgeon: Emelia Loron, MD;  Location: [facility];  Service: General;  Laterality: Left;   OPERATIVE ULTRASOUND N/A 04/21/2022   Procedure: OPERATIVE ULTRASOUND;  Surgeon: Patton Salles, MD;  Location: [facility];  Service: Gynecology;  Laterality: N/A;   RADIOACTIVE SEED GUIDED PARTIAL MASTECTOMY WITH AXILLARY SENTINEL LYMPH NODE BIOPSY Right 03/26/2016   Procedure: RIGHT  BREAST LUMPECTOMY WITH RADIOACTIVE SEED AND SENTINEL LYMPH NODE BIOPSY;  Surgeon: Emelia Loron, MD;  Location: [facility];  Service: General;  Laterality: Right;   RADIOACTIVE SEED GUIDED PARTIAL MASTECTOMY WITH AXILLARY SENTINEL LYMPH NODE BIOPSY Left 05/20/2016   Procedure: LEFT BREAST RADIOACTIVE SEED (two seeds) GUIDED LUMPECTOMY WITH LEFT AXILLARY SENTINEL LYMPH NODE BIOPSY AND LEFT SEED TARGETED AXILLARY LYMPH NODE EXCISION;  Surgeon: Emelia Loron, MD;  Location: [facility];  Service: General;  Laterality: Left;   RE-EXCISION OF BREAST CANCER,SUPERIOR MARGINS Right 05/20/2016   Procedure: RE-EXCISION OF RIGHT BREAST MARGIN;  Surgeon: Emelia Loron, MD;  Location: [facility];  Service: General;  Laterality: Right;   RE-EXCISION OF BREAST CANCER,SUPERIOR MARGINS Left 06/02/2016   Procedure: RE-EXCISION OF BREAST CANCER,SUPERIOR MARGINS;  Surgeon: Emelia Loron, MD;  Location: [facility];  Service: General;  Laterality: Left;   REMOVAL OF TISSUE EXPANDER AND PLACEMENT OF IMPLANT Left 04/17/2017   Procedure: REMOVAL OF LEFT TISSUE EXPANDER WITH PLACEMENT OF LEFT SILICONE BREAST IMPLANT;  Surgeon: Glenna Fellows, MD;  Location: [facility];  Service: Plastics;  Laterality: Left;   ROBOTIC ASSISTED LAPAROSCOPIC LYSIS OF ADHESION N/A 06/26/2022   Procedure: XI ROBOTIC ASSISTED LAPAROSCOPIC LYSIS OF ADHESION;  Surgeon: Carver Fila, MD;  Location: [facility];  Service: Gynecology;  Laterality: N/A;   ROBOTIC ASSISTED TOTAL HYSTERECTOMY WITH BILATERAL SALPINGO OOPHERECTOMY N/A 06/26/2022   Procedure: XI ROBOTIC ASSISTED TOTAL HYSTERECTOMY WITH BILATERAL SALPINGO OOPHORECTOMY, MINI LAPAROTOMY;  Surgeon: Carver Fila, MD;  Location: [facility];  Service: Gynecology;  Laterality: N/A;   shoulder surgery for arthritis Left 07/2021   arthroscopic done at [facility] [place]   Patient Active Problem  List   Diagnosis Date Noted   Pelvic adhesions 06/26/2022   Degenerative tear of medial meniscus 01/23/2022   Greater trochanteric bursitis of right hip 06/11/2021   Arthritis of carpometacarpal [facility]) joint of left thumb 06/11/2021   Mass of right lung 07/13/2020   Arthritis 08/03/2019   Rosacea 08/03/2019   Pes anserinus bursitis of right knee 03/28/2019   Pain in right knee 03/22/2019   Thickened endometrium 10/24/2018   Adhesive capsulitis of right shoulder 10/18/2018   Pain in right hand 10/05/2018   Tear of biceps tendon 09/21/2018   Moderate persistent asthma, uncomplicated 06/29/2017   Hallux rigidus of right foot 06/15/2017   Left Achilles tendinitis 06/15/2017   History of bilateral breast cancer 04/23/2017   Mild persistent asthma, uncomplicated 02/12/2017   Other seasonal allergic rhinitis 02/12/2017   History of therapeutic radiation 02/11/2017   Malignant neoplasm of overlapping sites of left breast in female, estrogen receptor positive (HCC) 10/27/2016   Sinusitis 09/01/2016   Hyponatremia 09/01/2016   Acquired absence of left breast 07/16/2016   Malignant neoplasm of upper-outer quadrant of right breast in female, estrogen receptor positive (  HCC) 04/19/2016   Dizziness after extension of neck 02/19/2016   Sensorineural hearing loss (SNHL), bilateral 02/19/2016   Tinnitus of both ears 02/19/2016   Bunion of great toe of right foot 04/04/2015   Hemorrhoid 11/23/2014   Anal skin tag 11/23/2014   Incontinence of urine 07/28/2014   Retrocalcaneal bursitis 05/11/2014   Pain in joint, ankle and foot 05/11/2014   Heart burn    Joint pain    Abnormal Pap smear    Osteopenia     ONSET DATE: 12/03/2022 (referral date)  Pt presented to ED s/p fall on 11/15/22   REFERRING DIAG:  M57.846 (ICD-10-CM) - Left hand pain  M24.042 (ICD-10-CM) - Loose body in joint of left hand  **ED notes show diagnosis: Closed fracture of proximal end of radius  THERAPY DIAG:  Muscle  weakness (generalized)  Stiffness of left elbow, not elsewhere classified  Pain in left elbow  Rationale for Evaluation and Treatment: Rehabilitation  SUBJECTIVE:   SUBJECTIVE STATEMENT: I fell on 11/15/22. Splinted me for 9 days. The doctor cleared me for A/ROM, AA/ROM, and P/ROM. Strengthening in one more week to tolerance Pt accompanied by: self  PERTINENT HISTORY: history of osteopenia, GERD, breast cancer, hypertension, OSA, CAD, emphysema, recent hysterectomy 05/2022, toe surgery 07/2022  PRECAUTIONS: Other: TBD by orthopedist on 12/10/22  WEIGHT BEARING RESTRICTIONS:  NWB LUE currently  PAIN:  Are you having pain? Yes: NPRS scale: 0-2/10 Pain location: Lt elbow, thumb CMC Pain description: achy Aggravating factors: movement Relieving factors: rest  FALLS: Has patient fallen in last 6 months? Yes. Number of falls 1  LIVING ENVIRONMENT: Lives with: lives alone Lives in: Other townhome w/ 2 steps to enter w/ railings , lives on first floor Has following equipment at home: None  PLOF: Independent, Vocation/Vocational requirements: retired, and Leisure: golf  PATIENT GOALS: get ROM better so I can golf  NEXT MD VISIT: 12/10/22  OBJECTIVE:   HAND DOMINANCE: Right  ADLs: Eating: independent Grooming: independent UB Dressing: independent  LB Dressing: independent Toileting: independent Bathing: independent  Tub Shower transfers: independent **difficulty opening jars/containers  Pt cooks w/ adaptations, has cleaning lady to clean house  UPPER EXTREMITY ROM:     Active ROM Right eval Left eval  Shoulder flexion    Shoulder abduction    Shoulder adduction    Shoulder extension    Shoulder internal rotation    Shoulder external rotation    Elbow flexion  135  Elbow extension  -24  Wrist flexion  WFLS  Wrist extension  WFLS  Wrist ulnar deviation    Wrist radial deviation    Wrist pronation  45  Wrist supination  50  (Blank rows = not  tested)  12/24/22: elbow flex = 143*, elbow ext = 15*  FA supination = 80*, pronation = 70*    UPPER EXTREMITY MMT:   Not tested d/t precautions  HAND FUNCTION: Grip strength: Right: 57.3 lbs; Left: 20.3 lbs (pain at [facility] joint Lt thumb - premorbid)   COORDINATION: 9 Hole Peg test: Right: 18 sec; Left: 20 sec  SENSATION: Previous mild deficits LUE from breast implant  EDEMA: mild Lt elbow  COGNITION: Overall cognitive status: Within functional limits for tasks assessed  OBSERVATIONS: Pleasant, limited ROM in elbow flex/ex, FA sup/pron, mild pain    TODAY'S TREATMENT:  Pt saw MD on 12/10/22 (2 weeks ago) - he cleared her for all ROM (Active, Passive, AA/ROM) and strengthening in 3 weeks to pt's tolerance while respecting pain - (so pt can begin strengthening next week)  Added to HEP to include P/ROM in elbow flex, ext, FA sup/pron, and also to provide thumb stabilization ex's - see pt instructions for details.   Forearm gym to increased combined active wrist and forearm motion x 2.  Supinator roller to promote active sup/pron  Re-assessed elbow and FA ROM - see above measurements in bold under objective. Pt has improved significantly since evaluation. Reviewed improvements with patient Pt shown anatomy of elbow and where fracture was for greater understanding of movements/mechanics  PATIENT EDUCATION: Education details: P/ROM HEP for Lt elbow and forearm, thumb A/ROM HEP Person educated: Patient Education method: Programmer, multimedia, Demonstration, Verbal cues, and Handouts Education comprehension: verbalized understanding, returned demonstration, and verbal cues required  HOME EXERCISE PROGRAM: 12/09/22: A/ROM HEP for Lt elbow and forearm 12/24/22: P/ROM HEP for Lt elbow and forearm, A/ROM HEP for Lt thumb for stabilization/strengthening to thenar  eminence  GOALS: Goals reviewed with patient? Yes  SHORT TERM GOALS: Target date: 01/02/23  Independent with initial ROM HEP for Lt elbow and forearm Baseline: Goal status: MET  2.  Pt to improve Lt elbow flex to 140* or greater, elbow ext to -18* or better Baseline: 135*, -24* Goal status: MET   3.  Pt to improve Lt FA supination and pronation by 10* each Baseline: sup = 50*, pron = 45* Goal status: MET   4.  Pt to verbalize understanding to reduce Lt thumb CMC joint pain and improve stability including: HEP, task modifications and/or AE, and splinting prn Baseline:  Goal status: IN PROGRESS    LONG TERM GOALS: Target date: 01/16/23  Independent with updated strengthening HEP Baseline:  Goal status: INITIAL  2.  Improve grip strength Lt hand by 10 lbs or greater Baseline: 20 lbs Goal status: INITIAL  3.  Improve Lt elbow ext to -10* or better for functional reaching Baseline: -24* Goal status: INITIAL  4.  Improve FA supination and pronation each by 20* or greater Baseline: sup = 50*, pron = 45*  Goal status: INITIAL  5.  Pt to return to using LUE as assist for all bilateral tasks including golfing Baseline:  Goal status: INITIAL   ASSESSMENT:  CLINICAL IMPRESSION: Patient has already met 3/4 STG's and has improved significantly in elbow and forearm ROM since evaluation.   PERFORMANCE DEFICITS: in functional skills including IADLs, edema, ROM, strength, pain, flexibility, Gross motor control, body mechanics, decreased knowledge of use of DME, and UE functional use,  IMPAIRMENTS: are limiting patient from IADLs and leisure.   COMORBIDITIES: may have co-morbidities  that affects occupational performance. Patient will benefit from skilled OT to address above impairments and improve overall function.  MODIFICATION OR ASSISTANCE TO COMPLETE EVALUATION: No modification of tasks or assist necessary to complete an evaluation.  OT OCCUPATIONAL PROFILE AND HISTORY:  Problem focused assessment: Including review of records relating to presenting problem.  CLINICAL DECISION MAKING: Moderate - several treatment options, min-mod task modification necessary  REHAB POTENTIAL: Good  EVALUATION COMPLEXITY: Low      PLAN:  OT FREQUENCY: 2x/week  OT DURATION: 6 weeks  PLANNED INTERVENTIONS: self care/ADL training, therapeutic exercise, therapeutic activity, manual therapy, passive range of motion, splinting, electrical stimulation, ultrasound, fluidotherapy, compression bandaging, moist heat, cryotherapy, contrast bath, patient/family education, and DME and/or AE instructions  RECOMMENDED  OTHER SERVICES: none  CONSULTED AND AGREED WITH PLAN OF CARE: Patient  PLAN FOR NEXT SESSION: progress to light strengthening to elbow and forearm as tolerated, UBE, joint protection strategies for Lt thumb   Sheran Lawless, OT 12/24/2022, 9:42 AM

## 2022-12-29 ENCOUNTER — Ambulatory Visit: Payer: Medicare PPO | Admitting: Sports Medicine

## 2022-12-29 ENCOUNTER — Encounter: Payer: Self-pay | Admitting: Occupational Therapy

## 2022-12-29 ENCOUNTER — Ambulatory Visit: Payer: Medicare PPO | Attending: Sports Medicine | Admitting: Occupational Therapy

## 2022-12-29 VITALS — HR 75 | Ht 64.0 in | Wt 162.0 lb

## 2022-12-29 DIAGNOSIS — M542 Cervicalgia: Secondary | ICD-10-CM

## 2022-12-29 DIAGNOSIS — M1812 Unilateral primary osteoarthritis of first carpometacarpal joint, left hand: Secondary | ICD-10-CM

## 2022-12-29 DIAGNOSIS — M25522 Pain in left elbow: Secondary | ICD-10-CM

## 2022-12-29 DIAGNOSIS — M25622 Stiffness of left elbow, not elsewhere classified: Secondary | ICD-10-CM

## 2022-12-29 DIAGNOSIS — M6281 Muscle weakness (generalized): Secondary | ICD-10-CM

## 2022-12-29 NOTE — Patient Instructions (Addendum)
Flexion (Resistive)    Hold a 1-2 lbs in hand and bend elbow, keeping wrist straight. Support elbow with folded towel on table, or hold close to body. Hold ____ seconds. Repeat _10___ times. Do __1-2__ sessions per day.   2. Elbow Extension: Resisted    Lie on back, __1-2__ pounds weight in right hand, arm up, elbow bent and supported. Straighten elbow. Return slowly. Repeat __10__ times per set. Do _1-2___ sessions per day.  3. Pronation / Supination (Resistive)    Hold hammer weighing _16___ ounces and rotate palm up and down. Keep elbow flexed at side and wrist straight. Repeat _10___ times each way. Do __1-2__ sessions per day.   4. Hold 4 lb dumbbell in hand and support upper arm on pillow - let weight pull down to help straighten elbow. Hold 20 sec. Rest, then repeat x 5

## 2022-12-29 NOTE — Patient Instructions (Signed)
Tylenol [phone] mg 2-3 times a day for pain relief  Neck HEP  PT referral  Can use thumb brace  Voltaren gel over area of pain  Warm hand bathes  As needed follow up

## 2022-12-29 NOTE — Therapy (Signed)
OUTPATIENT OCCUPATIONAL THERAPY ORTHO TREATMENT  Patient Name: Sabrina Tapia MRN: [id number] DOB:Jan 22, 1951, 72 y.o., female Today's Date: 12/29/2022  PCP: Emilio Aspen, MD REFERRING PROVIDER: Richardean Sale, DO  END OF SESSION:  OT End of Session - 12/29/22 1104     Visit Number 3    Number of Visits 12    Date for OT Re-Evaluation 01/16/23    Authorization Type [employer] MCR    Progress Note Due on Visit 10    OT Start Time 1100    OT Stop Time 1145    OT Time Calculation (min) 45 min    Activity Tolerance Patient tolerated treatment well    Behavior During Therapy WFL for tasks assessed/performed             Past Medical History:  Diagnosis Date   Allergy-induced asthma    prn inhaler   Arterial calcification    sees pcp for takes atorvastatin  for   CAD (coronary artery disease)    Constipation    Emphysema of lung (HCC)    Endometrial thickening on ultrasound    Environmental and seasonal allergies    Family history of adverse reaction to anesthesia    pt's father has hx. of being hard to wake up post-op x 1 time after 8 hour whipple surgery   GERD (gastroesophageal reflux disease)    Hemorrhoids    Hisory of Migraines    History of basal cell carcinoma (BCC) excision    right clavicle   History of breast cancer 2017   bilateral   History of radiation therapy    08/19/16- 09/29/16, Right Breast 50 Gy in 25 fractions, Right Breast boost 10 Gy in 5 fractions, Left Breast 50.4 Gy in 28 fractions   Hypertension    OSA (obstructive sleep apnea)    uses dental appliance mild /moderate osa   Osteoarthritis    hands and feet   Osteopenia 02/2016   T score -1.3 FRAX 8.3%/0.7% stable from prior DEXA   Overactive bladder    Stroke [facility])    Thyroid nodule 2012   Solitary nodule in the lower pole of the left thyroid lobe , pt unaware   Wears glasses for reading    Wears hearing aid in both ears    Past Surgical History:  Procedure Laterality Date    BREAST RECONSTRUCTION WITH PLACEMENT OF TISSUE EXPANDER AND FLEX HD (ACELLULAR HYDRATED DERMIS) Left 07/01/2016   Procedure: BREAST RECONSTRUCTION WITH PLACEMENT OF TISSUE EXPANDER AND  ALLODERM;  Surgeon: Glenna Fellows, MD;  Location: [facility];  Service: Plastics;  Laterality: Left;   BROW LIFT     COLONOSCOPY  2022   DILATATION & CURETTAGE/HYSTEROSCOPY WITH MYOSURE N/A 12/06/2018   Procedure: DILATATION & CURETTAGE/HYSTEROSCOPY WITH MYOSURE;  Surgeon: Patton Salles, MD;  Location: [facility];  Service: Gynecology;  Laterality: N/A;  follow 1st case   DILATATION & CURETTAGE/HYSTEROSCOPY WITH MYOSURE N/A 04/21/2022   Procedure: DILATATION & CURETTAGE/HYSTEROSCOPY;  Surgeon: Patton Salles, MD;  Location: [facility];  Service: Gynecology;  Laterality: N/A;   INCISION AND DRAINAGE Left 06/02/2016   Procedure: DRAINAGE of left axilla seroma;  Surgeon: Emelia Loron, MD;  Location: [facility];  Service: General;  Laterality: Left;   LIPOSUCTION WITH LIPOFILLING Left 06/11/2018   Procedure: Lipofilling from abdomen to left chest;  Surgeon: Glenna Fellows, MD;  Location: [facility];  Service: Plastics;  Laterality: Left;  MASTOPEXY Right 04/17/2017   Procedure: RIGHT MASTOPEXY;  Surgeon: Glenna Fellows, MD;  Location: [facility];  Service: Plastics;  Laterality: Right;   NIPPLE SPARING MASTECTOMY Left 07/01/2016   Procedure: LEFT NIPPLE SPARING MASTECTOMY;  Surgeon: Emelia Loron, MD;  Location: [facility];  Service: General;  Laterality: Left;   OPERATIVE ULTRASOUND N/A 04/21/2022   Procedure: OPERATIVE ULTRASOUND;  Surgeon: Patton Salles, MD;  Location: [facility];  Service: Gynecology;  Laterality: N/A;   RADIOACTIVE SEED GUIDED PARTIAL MASTECTOMY WITH AXILLARY SENTINEL LYMPH NODE BIOPSY Right 03/26/2016   Procedure: RIGHT  BREAST LUMPECTOMY WITH RADIOACTIVE SEED AND SENTINEL LYMPH NODE BIOPSY;  Surgeon: Emelia Loron, MD;  Location: [facility];  Service: General;  Laterality: Right;   RADIOACTIVE SEED GUIDED PARTIAL MASTECTOMY WITH AXILLARY SENTINEL LYMPH NODE BIOPSY Left 05/20/2016   Procedure: LEFT BREAST RADIOACTIVE SEED (two seeds) GUIDED LUMPECTOMY WITH LEFT AXILLARY SENTINEL LYMPH NODE BIOPSY AND LEFT SEED TARGETED AXILLARY LYMPH NODE EXCISION;  Surgeon: Emelia Loron, MD;  Location: [facility];  Service: General;  Laterality: Left;   RE-EXCISION OF BREAST CANCER,SUPERIOR MARGINS Right 05/20/2016   Procedure: RE-EXCISION OF RIGHT BREAST MARGIN;  Surgeon: Emelia Loron, MD;  Location: [facility];  Service: General;  Laterality: Right;   RE-EXCISION OF BREAST CANCER,SUPERIOR MARGINS Left 06/02/2016   Procedure: RE-EXCISION OF BREAST CANCER,SUPERIOR MARGINS;  Surgeon: Emelia Loron, MD;  Location: [facility];  Service: General;  Laterality: Left;   REMOVAL OF TISSUE EXPANDER AND PLACEMENT OF IMPLANT Left 04/17/2017   Procedure: REMOVAL OF LEFT TISSUE EXPANDER WITH PLACEMENT OF LEFT SILICONE BREAST IMPLANT;  Surgeon: Glenna Fellows, MD;  Location: [facility];  Service: Plastics;  Laterality: Left;   ROBOTIC ASSISTED LAPAROSCOPIC LYSIS OF ADHESION N/A 06/26/2022   Procedure: XI ROBOTIC ASSISTED LAPAROSCOPIC LYSIS OF ADHESION;  Surgeon: Carver Fila, MD;  Location: [facility];  Service: Gynecology;  Laterality: N/A;   ROBOTIC ASSISTED TOTAL HYSTERECTOMY WITH BILATERAL SALPINGO OOPHERECTOMY N/A 06/26/2022   Procedure: XI ROBOTIC ASSISTED TOTAL HYSTERECTOMY WITH BILATERAL SALPINGO OOPHORECTOMY, MINI LAPAROTOMY;  Surgeon: Carver Fila, MD;  Location: [facility];  Service: Gynecology;  Laterality: N/A;   shoulder surgery for arthritis Left 07/2021   arthroscopic done at [facility] [place]   Patient Active Problem  List   Diagnosis Date Noted   Pelvic adhesions 06/26/2022   Degenerative tear of medial meniscus 01/23/2022   Greater trochanteric bursitis of right hip 06/11/2021   Arthritis of carpometacarpal [facility]) joint of left thumb 06/11/2021   Mass of right lung 07/13/2020   Arthritis 08/03/2019   Rosacea 08/03/2019   Pes anserinus bursitis of right knee 03/28/2019   Pain in right knee 03/22/2019   Thickened endometrium 10/24/2018   Adhesive capsulitis of right shoulder 10/18/2018   Pain in right hand 10/05/2018   Tear of biceps tendon 09/21/2018   Moderate persistent asthma, uncomplicated 06/29/2017   Hallux rigidus of right foot 06/15/2017   Left Achilles tendinitis 06/15/2017   History of bilateral breast cancer 04/23/2017   Mild persistent asthma, uncomplicated 02/12/2017   Other seasonal allergic rhinitis 02/12/2017   History of therapeutic radiation 02/11/2017   Malignant neoplasm of overlapping sites of left breast in female, estrogen receptor positive (HCC) 10/27/2016   Sinusitis 09/01/2016   Hyponatremia 09/01/2016   Acquired absence of left breast 07/16/2016   Malignant neoplasm of upper-outer quadrant of right breast in female, estrogen receptor positive (  HCC) 04/19/2016   Dizziness after extension of neck 02/19/2016   Sensorineural hearing loss (SNHL), bilateral 02/19/2016   Tinnitus of both ears 02/19/2016   Bunion of great toe of right foot 04/04/2015   Hemorrhoid 11/23/2014   Anal skin tag 11/23/2014   Incontinence of urine 07/28/2014   Retrocalcaneal bursitis 05/11/2014   Pain in joint, ankle and foot 05/11/2014   Heart burn    Joint pain    Abnormal Pap smear    Osteopenia     ONSET DATE: 12/03/2022 (referral date)  Pt presented to ED s/p fall on 11/15/22   REFERRING DIAG:  Q65.784 (ICD-10-CM) - Left hand pain  M24.042 (ICD-10-CM) - Loose body in joint of left hand  **ED notes show diagnosis: Closed fracture of proximal end of radius  THERAPY DIAG:  Muscle  weakness (generalized)  Stiffness of left elbow, not elsewhere classified  Pain in left elbow  Rationale for Evaluation and Treatment: Rehabilitation  SUBJECTIVE:   SUBJECTIVE STATEMENT: My elbow really doesn't hurt anymore Pt accompanied by: self  PERTINENT HISTORY: history of osteopenia, GERD, breast cancer, hypertension, OSA, CAD, emphysema, recent hysterectomy 05/2022, toe surgery 07/2022  PRECAUTIONS: Other: Cleared for strengthening week of 12/29/22  WEIGHT BEARING RESTRICTIONS:  NWB LUE currently  PAIN:  Are you having pain? Yes: NPRS scale: 0-2/10 Pain location: Lt thumb CMC Pain description: achy Aggravating factors: movement Relieving factors: rest  FALLS: Has patient fallen in last 6 months? Yes. Number of falls 1  LIVING ENVIRONMENT: Lives with: lives alone Lives in: Other townhome w/ 2 steps to enter w/ railings , lives on first floor Has following equipment at home: None  PLOF: Independent, Vocation/Vocational requirements: retired, and Leisure: golf  PATIENT GOALS: get ROM better so I can golf  NEXT MD VISIT: 12/10/22  OBJECTIVE:   HAND DOMINANCE: Right  ADLs: Eating: independent Grooming: independent UB Dressing: independent  LB Dressing: independent Toileting: independent Bathing: independent  Tub Shower transfers: independent **difficulty opening jars/containers  Pt cooks w/ adaptations, has cleaning lady to clean house  UPPER EXTREMITY ROM:     Active ROM Right eval Left eval  Shoulder flexion    Shoulder abduction    Shoulder adduction    Shoulder extension    Shoulder internal rotation    Shoulder external rotation    Elbow flexion  135  Elbow extension  -24  Wrist flexion  WFLS  Wrist extension  WFLS  Wrist ulnar deviation    Wrist radial deviation    Wrist pronation  45  Wrist supination  50  (Blank rows = not tested)  12/24/22: elbow flex = 143*, elbow ext = 15*  FA supination = 80*, pronation = 70*    UPPER  EXTREMITY MMT:   Not tested d/t precautions  HAND FUNCTION: Grip strength: Right: 57.3 lbs; Left: 20.3 lbs (pain at [facility] joint Lt thumb - premorbid)   COORDINATION: 9 Hole Peg test: Right: 18 sec; Left: 20 sec  SENSATION: Previous mild deficits LUE from breast implant  EDEMA: mild Lt elbow  COGNITION: Overall cognitive status: Within functional limits for tasks assessed  OBSERVATIONS: Pleasant, limited ROM in elbow flex/ex, FA sup/pron, mild pain    TODAY'S TREATMENT:  Initiated strengthening HEP for elbow and forearm - see pt instructions for details.  UBE x 6 mintues, level 1 for UE strength/endurance  Reviewed task modifications, possible A/E, and joint protection techniques for Lt thumb pain   PATIENT EDUCATION: Education details: strengthening HEP for elbow/forearm Person educated: Patient Education method: Explanation, Demonstration, Verbal cues, and Handouts Education comprehension: verbalized understanding, returned demonstration, and verbal cues required  HOME EXERCISE PROGRAM: 12/09/22: A/ROM HEP for Lt elbow and forearm 12/24/22: P/ROM HEP for Lt elbow and forearm, A/ROM HEP for Lt thumb for stabilization/strengthening to thenar eminence 12/29/22: strengthening HEP for Lt elbow/forearm  GOALS: Goals reviewed with patient? Yes  SHORT TERM GOALS: Target date: 01/02/23  Independent with initial ROM HEP for Lt elbow and forearm Baseline: Goal status: MET  2.  Pt to improve Lt elbow flex to 140* or greater, elbow ext to -18* or better Baseline: 135*, -24* Goal status: MET   3.  Pt to improve Lt FA supination and pronation by 10* each Baseline: sup = 50*, pron = 45* Goal status: MET   4.  Pt to verbalize understanding to reduce Lt thumb CMC joint pain and improve stability including: HEP, task modifications and/or AE, and splinting  prn Baseline:  Goal status: IN PROGRESS    LONG TERM GOALS: Target date: 01/16/23  Independent with updated strengthening HEP Baseline:  Goal status: IN PROGRESS  2.  Improve grip strength Lt hand by 10 lbs or greater Baseline: 20 lbs Goal status: INITIAL  3.  Improve Lt elbow ext to -10* or better for functional reaching Baseline: -24* Goal status: IN PROGRESS  4.  Improve FA supination and pronation each by 20* or greater Baseline: sup = 50*, pron = 45*  Goal status: MET (Sup = 80*, pron = 70*)  5.  Pt to return to using LUE as assist for all bilateral tasks including golfing Baseline:  Goal status: IN PROGRESS   ASSESSMENT:  CLINICAL IMPRESSION: Patient has already met 3/4 STG's and has improved significantly in elbow and forearm ROM since evaluation. Pt progressing towards LTG's  PERFORMANCE DEFICITS: in functional skills including IADLs, edema, ROM, strength, pain, flexibility, Gross motor control, body mechanics, decreased knowledge of use of DME, and UE functional use,  IMPAIRMENTS: are limiting patient from IADLs and leisure.   COMORBIDITIES: may have co-morbidities  that affects occupational performance. Patient will benefit from skilled OT to address above impairments and improve overall function.  MODIFICATION OR ASSISTANCE TO COMPLETE EVALUATION: No modification of tasks or assist necessary to complete an evaluation.  OT OCCUPATIONAL PROFILE AND HISTORY: Problem focused assessment: Including review of records relating to presenting problem.  CLINICAL DECISION MAKING: Moderate - several treatment options, min-mod task modification necessary  REHAB POTENTIAL: Good  EVALUATION COMPLEXITY: Low      PLAN:  OT FREQUENCY: 2x/week  OT DURATION: 6 weeks  PLANNED INTERVENTIONS: self care/ADL training, therapeutic exercise, therapeutic activity, manual therapy, passive range of motion, splinting, electrical stimulation, ultrasound, fluidotherapy,  compression bandaging, moist heat, cryotherapy, contrast bath, patient/family education, and DME and/or AE instructions  RECOMMENDED OTHER SERVICES: none  CONSULTED AND AGREED WITH PLAN OF CARE: Patient  PLAN FOR NEXT SESSION: Putty HEP for Lt grip and thumb as tolerated, reassess grip strength, begin checking remaining goals - possible d/c next 1-2 visits   Sheran Lawless, OT 12/29/2022, 11:04 AM

## 2022-12-30 ENCOUNTER — Other Ambulatory Visit: Payer: Self-pay | Admitting: Sports Medicine

## 2022-12-30 DIAGNOSIS — M542 Cervicalgia: Secondary | ICD-10-CM

## 2022-12-30 NOTE — Progress Notes (Signed)
Pt referral was placed

## 2023-01-07 DIAGNOSIS — M13842 Other specified arthritis, left hand: Secondary | ICD-10-CM | POA: Diagnosis not present

## 2023-01-07 DIAGNOSIS — S52102A Unspecified fracture of upper end of left radius, initial encounter for closed fracture: Secondary | ICD-10-CM | POA: Diagnosis not present

## 2023-01-07 DIAGNOSIS — M67432 Ganglion, left wrist: Secondary | ICD-10-CM | POA: Diagnosis not present

## 2023-01-09 ENCOUNTER — Other Ambulatory Visit: Payer: Self-pay | Admitting: Hematology and Oncology

## 2023-01-12 ENCOUNTER — Other Ambulatory Visit (HOSPITAL_COMMUNITY): Payer: Self-pay | Admitting: Internal Medicine

## 2023-01-12 DIAGNOSIS — I700 Atherosclerosis of aorta: Secondary | ICD-10-CM

## 2023-01-12 DIAGNOSIS — J432 Centrilobular emphysema: Secondary | ICD-10-CM | POA: Diagnosis not present

## 2023-01-12 DIAGNOSIS — C50411 Malignant neoplasm of upper-outer quadrant of right female breast: Secondary | ICD-10-CM | POA: Diagnosis not present

## 2023-01-12 DIAGNOSIS — E7800 Pure hypercholesterolemia, unspecified: Secondary | ICD-10-CM | POA: Diagnosis not present

## 2023-01-12 DIAGNOSIS — I2510 Atherosclerotic heart disease of native coronary artery without angina pectoris: Secondary | ICD-10-CM | POA: Diagnosis not present

## 2023-01-12 DIAGNOSIS — I10 Essential (primary) hypertension: Secondary | ICD-10-CM | POA: Diagnosis not present

## 2023-01-12 DIAGNOSIS — R002 Palpitations: Secondary | ICD-10-CM | POA: Diagnosis not present

## 2023-01-12 DIAGNOSIS — D0511 Intraductal carcinoma in situ of right breast: Secondary | ICD-10-CM | POA: Diagnosis not present

## 2023-01-12 DIAGNOSIS — M8580 Other specified disorders of bone density and structure, unspecified site: Secondary | ICD-10-CM | POA: Diagnosis not present

## 2023-01-13 ENCOUNTER — Ambulatory Visit: Payer: Medicare PPO | Admitting: Occupational Therapy

## 2023-01-15 ENCOUNTER — Ambulatory Visit: Payer: Medicare PPO | Admitting: Occupational Therapy

## 2023-01-19 ENCOUNTER — Ambulatory Visit: Payer: Medicare PPO | Admitting: Occupational Therapy

## 2023-01-22 ENCOUNTER — Ambulatory Visit: Payer: Medicare PPO | Admitting: Occupational Therapy

## 2023-01-28 ENCOUNTER — Ambulatory Visit (HOSPITAL_COMMUNITY)
Admission: RE | Admit: 2023-01-28 | Discharge: 2023-01-28 | Disposition: A | Discharge status: Home / Self Care | Payer: Medicare PPO | Source: Ambulatory Visit | Attending: Internal Medicine | Admitting: Internal Medicine

## 2023-01-28 DIAGNOSIS — I700 Atherosclerosis of aorta: Secondary | ICD-10-CM | POA: Insufficient documentation

## 2023-02-11 DIAGNOSIS — I700 Atherosclerosis of aorta: Secondary | ICD-10-CM | POA: Diagnosis not present

## 2023-02-20 DIAGNOSIS — R008 Other abnormalities of heart beat: Secondary | ICD-10-CM | POA: Diagnosis not present

## 2023-02-21 DIAGNOSIS — R008 Other abnormalities of heart beat: Secondary | ICD-10-CM | POA: Diagnosis not present

## 2023-02-21 DIAGNOSIS — R002 Palpitations: Secondary | ICD-10-CM | POA: Diagnosis not present

## 2023-04-17 NOTE — Progress Notes (Signed)
Sab[facility] Tapia [facility] [address] [place] [address] Phone: [phone]   Assessment and Plan:     There are no diagnoses linked to this encounter.  ***   Pertinent previous records reviewed include ***   Follow Up: ***     Subjective:   I, Sabrina Tapia, am serving as a Neurosurgeon for Doctor Sabrina Tapia   Chief Complaint: piriformis pain    HPI:    10/30/2022 Patient is a 72 year old female complaining of piriformis pain. Patient states that she had a hysterectomy in November , she had a challenging operation, hard recovery, had a toe fusion in January, she spent 4 months on her couch , right side back pain, she played golf Monday and Tuesday and she is locked up, pain is radiating up her back from the glute to the low back , she wants something to calm down the pain , no numbness or tingling,      11/20/2022 Patient states enjoys the meloxicam and stretch combo , would like to see her xrays frm her fall , butts good though    12/29/2022 Patient states elbow is moving well , is seeing Sabrina Tapia in a week has had a lot of bone growth and is healing well, low back is great since she hasn't played golf , states she has had some pinching sensation in her neck and  decreased ROM   04/20/2023 Patient states    Relevant Historical Information: Recent hysterectomy, recent first toe fusion on right, history of breast cancer Additional pertinent review of systems negative.   Current Outpatient Medications:    acetaminophen (TYLENOL) 500 MG tablet, Take 500 mg by mouth every 6 (six) hours as needed for moderate pain., Disp: , Rfl:    albuterol (PROAIR HFA) 108 (90 Base) MCG/ACT inhaler, Inhale 2 puffs into the lungs every 4 (four) hours as needed for wheezing or shortness of breath., Disp: 1 Inhaler, Rfl: 1   ALPRAZolam (XANAX) 0.25 MG tablet, Take 0.25 mg by mouth daily as needed (travel)., Disp: , Rfl:    amLODipine (NORVASC) 2.5 MG  tablet, Take 2.5 mg by mouth daily., Disp: , Rfl:    atorvastatin (LIPITOR) 10 MG tablet, Take 10 mg by mouth at bedtime., Disp: , Rfl:    azelastine (ASTELIN) 0.1 % nasal spray, Place 1 spray into both nostrils daily., Disp: , Rfl:    cetirizine (ZYRTEC) 10 MG tablet, Take 10 mg by mouth at bedtime., Disp: , Rfl:    FLOVENT HFA 110 MCG/ACT inhaler, INHALE 1 PUFF BY MOUTH INTO THE LUNGS TWICE DAILY (Patient taking differently: Inhale 2 puffs into the lungs 2 (two) times daily as needed (shortness of breath).), Disp: 12 g, Rfl: 0   fluticasone (FLONASE) 50 MCG/ACT nasal spray, Place 1 spray into both nostrils daily., Disp: , Rfl:    hydrocortisone (ANUSOL-HC) 2.5 % rectal cream, Place 1 application rectally as needed for hemorrhoids or itching., Disp: , Rfl:    ibuprofen (ADVIL) 200 MG tablet, Take 200 mg by mouth every 6 (six) hours as needed for moderate pain., Disp: , Rfl:    meloxicam (MOBIC) 15 MG tablet, Take 1 tablet (15 mg total) by mouth daily., Disp: 14 tablet, Rfl: 0   mometasone (NASONEX) 50 MCG/ACT nasal spray, Place 2 sprays into the nose as needed., Disp: , Rfl:    omeprazole (PRILOSEC) 20 MG capsule, Take 20 mg by mouth daily., Disp: , Rfl:    OVER  THE COUNTER MEDICATION, Take 2 capsules by mouth daily. MEGASPOREBIOTIC, Disp: , Rfl:    psyllium (METAMUCIL) 58.6 % packet, Take 1 packet by mouth daily., Disp: , Rfl:    RESTASIS 0.05 % ophthalmic emulsion, Place 1 drop into both eyes 2 (two) times daily., Disp: , Rfl:    tamoxifen (NOLVADEX) 20 MG tablet, TAKE 1 TABLET(20 MG) BY MOUTH DAILY, Disp: 90 tablet, Rfl: 3   triamcinolone (KENALOG) 0.1 %, Apply 1 Application topically daily as needed (psoriasis)., Disp: , Rfl:    valACYclovir (VALTREX) 1000 MG tablet, Take 1 tablet (1,000 mg total) by mouth 2 (two) times daily. Take for 1 day as directed above as needed. (Patient taking differently: Take 1,000 mg by mouth 2 (two) times daily as needed (fever blisters).), Disp: 30 tablet, Rfl: 1    valsartan-hydrochlorothiazide (DIOVAN-HCT) 160-12.5 MG tablet, Take 1 tablet by mouth daily., Disp: , Rfl:    venlafaxine XR (EFFEXOR-XR) 75 MG 24 hr capsule, TAKE 1 CAPSULE(75 MG) BY MOUTH DAILY WITH BREAKFAST, Disp: 90 capsule, Rfl: 4   VITAMIN D PO, Take 2,000 Units by mouth daily., Disp: , Rfl:    Objective:     There were no vitals filed for this visit.    There is no height or weight on file to calculate BMI.    Physical Exam:    ***   Electronically signed by:  Sabrina Tapia [facility] 7:57 AM 04/17/23

## 2023-04-20 ENCOUNTER — Ambulatory Visit: Payer: Medicare PPO | Admitting: Sports Medicine

## 2023-04-20 VITALS — HR 85 | Ht 64.0 in | Wt 164.0 lb

## 2023-04-20 DIAGNOSIS — M5442 Lumbago with sciatica, left side: Secondary | ICD-10-CM | POA: Diagnosis not present

## 2023-04-20 DIAGNOSIS — G8929 Other chronic pain: Secondary | ICD-10-CM | POA: Diagnosis not present

## 2023-04-20 MED ORDER — MELOXICAM 15 MG PO TABS: 15.0000 mg | ORAL_TABLET | Freq: Every day | ORAL | 0 refills | Status: DC

## 2023-04-20 NOTE — Patient Instructions (Signed)
-   Start meloxicam 15 mg daily x2 weeks.  If still having pain after 2 weeks, complete 3rd-week of meloxicam. May use remaining meloxicam as needed once daily for pain control.  Do not to use additional NSAIDs while taking meloxicam.  May use Tylenol [phone] mg 2 to 3 times a day for breakthrough pain. Low back HEP  2 week follow up

## 2023-05-01 NOTE — Progress Notes (Unsigned)
Aleen Sells D[name] [address] [place] [address] Phone: [phone]   Assessment and Plan:     There are no diagnoses linked to this encounter.  ***   Pertinent previous records reviewed include ***   Follow Up: ***     Subjective:   I, Phyillis Dascoli, am serving as a Neurosurgeon for Doctor Richardean Sale   Chief Complaint: piriformis pain    HPI:    10/30/2022 Patient is a 72 year old female complaining of piriformis pain. Patient states that she had a hysterectomy in November , she had a challenging operation, hard recovery, had a toe fusion in January, she spent 4 months on her couch , right side back pain, she played golf Monday and Tuesday and she is locked up, pain is radiating up her back from the glute to the low back , she wants something to calm down the pain , no numbness or tingling,      11/20/2022 Patient states enjoys the meloxicam and stretch combo , would like to see her xrays frm her fall , butts good though    12/29/2022 Patient states elbow is moving well , is seeing Gramig in a week has had a lot of bone growth and is healing well, low back is great since she hasn't played golf , states she has had some pinching sensation in her neck and  decreased ROM    04/20/2023 Patient states that left sided low back pain. She was stretching 3 weeks ago on uneven ground , last week she played golf and instead of squatting she was bending over at the hip, ice helps for a little. Pain radiates down to her hamstring    05/04/2023 Patient states    Relevant Historical Information: Recent hysterectomy, recent first toe fusion on right, history of breast cancer  Additional pertinent review of systems negative.   Current Outpatient Medications:    acetaminophen (TYLENOL) 500 MG tablet, Take 500 mg by mouth every 6 (six) hours as needed for moderate pain., Disp: , Rfl:    albuterol (PROAIR HFA) 108 (90 Base) MCG/ACT inhaler,  Inhale 2 puffs into the lungs every 4 (four) hours as needed for wheezing or shortness of breath., Disp: 1 Inhaler, Rfl: 1   ALPRAZolam (XANAX) 0.25 MG tablet, Take 0.25 mg by mouth daily as needed (travel)., Disp: , Rfl:    amLODipine (NORVASC) 2.5 MG tablet, Take 2.5 mg by mouth daily., Disp: , Rfl:    atorvastatin (LIPITOR) 10 MG tablet, Take 10 mg by mouth at bedtime., Disp: , Rfl:    azelastine (ASTELIN) 0.1 % nasal spray, Place 1 spray into both nostrils daily., Disp: , Rfl:    cetirizine (ZYRTEC) 10 MG tablet, Take 10 mg by mouth at bedtime., Disp: , Rfl:    FLOVENT HFA 110 MCG/ACT inhaler, INHALE 1 PUFF BY MOUTH INTO THE LUNGS TWICE DAILY (Patient taking differently: Inhale 2 puffs into the lungs 2 (two) times daily as needed (shortness of breath).), Disp: 12 g, Rfl: 0   fluticasone (FLONASE) 50 MCG/ACT nasal spray, Place 1 spray into both nostrils daily., Disp: , Rfl:    hydrocortisone (ANUSOL-HC) 2.5 % rectal cream, Place 1 application rectally as needed for hemorrhoids or itching., Disp: , Rfl:    ibuprofen (ADVIL) 200 MG tablet, Take 200 mg by mouth every 6 (six) hours as needed for moderate pain., Disp: , Rfl:    meloxicam (MOBIC) 15 MG tablet, Take 1 tablet (  15 mg total) by mouth daily., Disp: 14 tablet, Rfl: 0   meloxicam (MOBIC) 15 MG tablet, Take 1 tablet (15 mg total) by mouth daily., Disp: 30 tablet, Rfl: 0   mometasone (NASONEX) 50 MCG/ACT nasal spray, Place 2 sprays into the nose as needed., Disp: , Rfl:    omeprazole (PRILOSEC) 20 MG capsule, Take 20 mg by mouth daily., Disp: , Rfl:    OVER THE COUNTER MEDICATION, Take 2 capsules by mouth daily. MEGASPOREBIOTIC, Disp: , Rfl:    psyllium (METAMUCIL) 58.6 % packet, Take 1 packet by mouth daily., Disp: , Rfl:    RESTASIS 0.05 % ophthalmic emulsion, Place 1 drop into both eyes 2 (two) times daily., Disp: , Rfl:    tamoxifen (NOLVADEX) 20 MG tablet, TAKE 1 TABLET(20 MG) BY MOUTH DAILY, Disp: 90 tablet, Rfl: 3   triamcinolone  (KENALOG) 0.1 %, Apply 1 Application topically daily as needed (psoriasis)., Disp: , Rfl:    valACYclovir (VALTREX) 1000 MG tablet, Take 1 tablet (1,000 mg total) by mouth 2 (two) times daily. Take for 1 day as directed above as needed. (Patient taking differently: Take 1,000 mg by mouth 2 (two) times daily as needed (fever blisters).), Disp: 30 tablet, Rfl: 1   valsartan-hydrochlorothiazide (DIOVAN-HCT) 160-12.5 MG tablet, Take 1 tablet by mouth daily., Disp: , Rfl:    venlafaxine XR (EFFEXOR-XR) 75 MG 24 hr capsule, TAKE 1 CAPSULE(75 MG) BY MOUTH DAILY WITH BREAKFAST, Disp: 90 capsule, Rfl: 4   VITAMIN D PO, Take 2,000 Units by mouth daily., Disp: , Rfl:    Objective:     There were no vitals filed for this visit.    There is no height or weight on file to calculate BMI.    Physical Exam:    ***   Electronically signed by:  Aleen Sells D[name] 7:38 AM 05/01/23

## 2023-05-04 ENCOUNTER — Ambulatory Visit: Payer: Medicare PPO | Admitting: Sports Medicine

## 2023-05-04 VITALS — BP 108/80 | HR 85 | Ht 64.0 in | Wt 164.0 lb

## 2023-05-04 DIAGNOSIS — M5442 Lumbago with sciatica, left side: Secondary | ICD-10-CM | POA: Diagnosis not present

## 2023-05-04 DIAGNOSIS — G8929 Other chronic pain: Secondary | ICD-10-CM

## 2023-05-04 NOTE — Patient Instructions (Signed)
Continue 1 more week of meloxicam and then discontinue Once finished with meloxicam course start Tylenol [phone] mg 2-3 times a day for pain relief  Can use remaining meloxicam no more than 1 time per week  As needed follow up

## 2023-06-08 ENCOUNTER — Inpatient Hospital Stay: Payer: Medicare PPO | Attending: Hematology and Oncology | Admitting: Hematology and Oncology

## 2023-06-08 VITALS — BP 133/80 | HR 85 | Temp 97.8°F | Resp 18 | Ht 64.0 in | Wt 163.5 lb

## 2023-06-08 DIAGNOSIS — Z923 Personal history of irradiation: Secondary | ICD-10-CM | POA: Diagnosis not present

## 2023-06-08 DIAGNOSIS — Z79899 Other long term (current) drug therapy: Secondary | ICD-10-CM | POA: Insufficient documentation

## 2023-06-08 DIAGNOSIS — M25539 Pain in unspecified wrist: Secondary | ICD-10-CM | POA: Insufficient documentation

## 2023-06-08 DIAGNOSIS — Z170 Estrogen receptor positive status [ER+]: Secondary | ICD-10-CM | POA: Insufficient documentation

## 2023-06-08 DIAGNOSIS — R232 Flushing: Secondary | ICD-10-CM | POA: Diagnosis not present

## 2023-06-08 DIAGNOSIS — Z79624 Long term (current) use of inhibitors of nucleotide synthesis: Secondary | ICD-10-CM | POA: Insufficient documentation

## 2023-06-08 DIAGNOSIS — C50411 Malignant neoplasm of upper-outer quadrant of right female breast: Secondary | ICD-10-CM | POA: Diagnosis not present

## 2023-06-08 DIAGNOSIS — Z7951 Long term (current) use of inhaled steroids: Secondary | ICD-10-CM | POA: Insufficient documentation

## 2023-06-08 DIAGNOSIS — R030 Elevated blood-pressure reading, without diagnosis of hypertension: Secondary | ICD-10-CM | POA: Insufficient documentation

## 2023-06-08 DIAGNOSIS — Z79810 Long term (current) use of selective estrogen receptor modulators (SERMs): Secondary | ICD-10-CM | POA: Diagnosis not present

## 2023-06-08 DIAGNOSIS — E7800 Pure hypercholesterolemia, unspecified: Secondary | ICD-10-CM | POA: Insufficient documentation

## 2023-06-08 DIAGNOSIS — Z791 Long term (current) use of non-steroidal anti-inflammatories (NSAID): Secondary | ICD-10-CM | POA: Insufficient documentation

## 2023-06-08 DIAGNOSIS — G8929 Other chronic pain: Secondary | ICD-10-CM | POA: Insufficient documentation

## 2023-06-08 DIAGNOSIS — Z9012 Acquired absence of left breast and nipple: Secondary | ICD-10-CM | POA: Insufficient documentation

## 2023-06-08 NOTE — Progress Notes (Signed)
Patient Care Team: Emilio Aspen, MD as PCP - General (Internal Medicine) Emelia Loron, MD as Consulting Physician (General Surgery) Magrinat, Valentino Hue, MD (Inactive) as Consulting Physician (Oncology) Axel Filler, Larna Daughters, NP as Nurse Practitioner (Hematology and Oncology) Ardell Isaacs, Forrestine Him, MD as Consulting Physician (Obstetrics and Gynecology)  DIAGNOSIS:  Encounter Diagnosis  Name Primary?   Malignant neoplasm of upper-outer quadrant of right breast in female, estrogen receptor positive (HCC) Yes    SUMMARY OF ONCOLOGIC HISTORY: Oncology History  Malignant neoplasm of upper-outer quadrant of right breast in female, estrogen receptor positive (HCC)  03/26/2016 Surgery   right breast upper outer quadrant lumpectomy 03/26/2016 for a pT1c pN1, stage IIA invasive ductal carcinoma, grade 1, estrogen and progesterone receptor positive, HER-2 nonamplified, with an MIB-1 of 12%              (a) mammaprint from this tumor was read as "low risk".          02/2016 Initial Diagnosis   Malignant neoplasm of upper-outer quadrant of right breast in female, estrogen receptor positive (HCC)   03/26/2016 Surgery   a second right breast cancer, invasive lobular, grade 1, measuring 0.6 cm, focally involved the final right medial margin from the 03/26/2016 procedure; this tumor also was estrogen and progesterone receptor positive, HER-2 negative, with an MIB-1 of 3%             (a) medial margin cleared with additional surgery 05/20/2016.   03/2016 -  Anti-estrogen oral therapy   tamoxifen started 04/18/2016--to be continued a minimum of 5 years, likely followed by anastrozole x 2 years   08/19/2016 - 09/29/2016 Radiation Therapy   radiation therapy 08/19/16-09/29/16 with capecitabine sensitization Site/dose:   1) Right breast/ 50Gy in 25 fractions                         2) Right breast boost/ 10 Gy in 5 fractions                         3) Left breast 50.4 Gy in 28  fractions   Malignant neoplasm of overlapping sites of left breast in female, estrogen receptor positive (HCC)  04/09/2016 Initial Biopsy   two biopsies from the left breast 04/09/2016 and 04/11/2016, 4.6 cm apart, showed             (a) centrally, an invasive ductal carcinoma, grade 1 or 2, with no prognostic panel available             (b) in the upper outer quadrant, invasive ductal carcinoma, grade 2, estrogen receptor 10% positive, progesterone receptor and HER-2 negative, with an MIB-1 of 2%             (c) left axillary lymph node biopsy 04/14/2016 showed invasive ductal carcinoma, estrogen and progesterone receptor negative             (d) mammaprint from (2)(c) was also "low risk"   03/2016 Initial Diagnosis   Malignant neoplasm of overlapping sites of left breast in female, estrogen receptor positive (HCC)   03/2016 -  Anti-estrogen oral therapy   tamoxifen started 04/18/2016--to be continued a minimum of 5 years, likely followed by anastrozole x 2 years   05/20/2016 Surgery   left lumpectomy 05/20/2016 showed a  pT1c pN1 stage IIA  invasive ductal carcinoma, grade 2, estrogen receptor positive, progesterone receptor negative, HER-2 nonamplified, with an MIB-1 of  2%; margins were positive    07/01/2016 Surgery   status post left sided nipple sparing mastectomy with immediate expander placement 07/01/2016 showing multiple foci of residual grade 2 invasive ductal carcinoma, the largest measuring 0.7 cm, with evidence of lymphovascular invasion and multiple close but negative margins   08/19/2016 - 09/29/2016 Radiation Therapy   radiation therapy 08/19/16-09/29/16 with capecitabine sensitization Site/dose:   1) Right breast/ 50Gy in 25 fractions                         2) Right breast boost/ 10 Gy in 5 fractions                         3) Left breast 50.4 Gy in 28 fractions     CHIEF COMPLIANT:   HISTORY OF PRESENT ILLNESS:  History of Present Illness   The patient, with a history  of breast cancer and hysterectomy, presents with chronic wrist pain. She has been managing the pain with Tylenol and a brace, but the pain persists. She saw an orthopedic specialist a year ago and received a shot that provided no relief. She is considering surgery but is concerned about the recovery time.  In addition to the wrist pain, the patient has been dealing with high blood pressure and high cholesterol for the past three years. She is currently on medication for both conditions prescribed by her family doctor. She has a family history of these conditions. She has an upcoming appointment with a cardiologist to further investigate these issues.  The patient also mentions that she has recently started working out again after a four-year hiatus. She is pleased with her progress and finds it beneficial for her overall health.         ALLERGIES:  is allergic to bacitracin, hydrocodone, other, penicillins, adhesive [tape], augmentin [amoxicillin-pot clavulanate], and penicillamine.  MEDICATIONS:  Current Outpatient Medications  Medication Sig Dispense Refill   albuterol (PROAIR HFA) 108 (90 Base) MCG/ACT inhaler Inhale 2 puffs into the lungs every 4 (four) hours as needed for wheezing or shortness of breath. 1 Inhaler 1   ALPRAZolam (XANAX) 0.25 MG tablet Take 0.25 mg by mouth daily as needed (travel).     amLODipine (NORVASC) 2.5 MG tablet Take 2.5 mg by mouth daily.     atorvastatin (LIPITOR) 10 MG tablet Take 10 mg by mouth at bedtime.     azelastine (ASTELIN) 0.1 % nasal spray Place 1 spray into both nostrils daily.     cetirizine (ZYRTEC) 10 MG tablet Take 10 mg by mouth at bedtime.     FLOVENT HFA 110 MCG/ACT inhaler INHALE 1 PUFF BY MOUTH INTO THE LUNGS TWICE DAILY (Patient taking differently: Inhale 2 puffs into the lungs 2 (two) times daily as needed (shortness of breath).) 12 g 0   fluticasone (FLONASE) 50 MCG/ACT nasal spray Place 1 spray into both nostrils daily.     hydrocortisone  (ANUSOL-HC) 2.5 % rectal cream Place 1 application rectally as needed for hemorrhoids or itching.     ibuprofen (ADVIL) 200 MG tablet Take 200 mg by mouth every 6 (six) hours as needed for moderate pain.     meloxicam (MOBIC) 15 MG tablet Take 1 tablet (15 mg total) by mouth daily. 30 tablet 0   omeprazole (PRILOSEC) 20 MG capsule Take 20 mg by mouth daily.     OVER THE COUNTER MEDICATION Take 2 capsules by mouth daily. MEGASPOREBIOTIC  psyllium (METAMUCIL) 58.6 % packet Take 1 packet by mouth daily.     RESTASIS 0.05 % ophthalmic emulsion Place 1 drop into both eyes 2 (two) times daily.     tamoxifen (NOLVADEX) 20 MG tablet TAKE 1 TABLET(20 MG) BY MOUTH DAILY 90 tablet 3   triamcinolone (KENALOG) 0.1 % Apply 1 Application topically daily as needed (psoriasis).     valACYclovir (VALTREX) 1000 MG tablet Take 1 tablet (1,000 mg total) by mouth 2 (two) times daily. Take for 1 day as directed above as needed. (Patient taking differently: Take 1,000 mg by mouth 2 (two) times daily as needed (fever blisters).) 30 tablet 1   valsartan-hydrochlorothiazide (DIOVAN-HCT) 160-12.5 MG tablet Take 1 tablet by mouth daily.     venlafaxine XR (EFFEXOR-XR) 75 MG 24 hr capsule TAKE 1 CAPSULE(75 MG) BY MOUTH DAILY WITH BREAKFAST 90 capsule 4   VITAMIN D PO Take 2,000 Units by mouth daily.     No current facility-administered medications for this visit.    PHYSICAL EXAMINATION: ECOG PERFORMANCE STATUS: 1 - Symptomatic but completely ambulatory  Vitals:   06/08/23 1134  BP: 133/80  Pulse: 85  Resp: 18  Temp: 97.8 F (36.6 C)  SpO2: 97%   Filed Weights   06/08/23 1134  Weight: 163 lb 8 oz (74.2 kg)   LABORATORY DATA:  I have reviewed the data as listed    Latest Ref Rng & Units 06/17/2022    8:44 AM 06/05/2022   10:46 AM 04/21/2022    7:58 AM  CMP  Glucose 70 - 99 mg/dL 85  91  92   BUN 8 - 23 mg/dL 20  15  15    Creatinine 0.44 - 1.00 mg/dL 1.61  0.96  0.45   Sodium 135 - 145 mmol/L 132  135   136   Potassium 3.5 - 5.1 mmol/L 4.3  4.2  3.9   Chloride 98 - 111 mmol/L 100  102  104   CO2 22 - 32 mmol/L 25  27    Calcium 8.9 - 10.3 mg/dL 9.1  9.2    Total Protein 6.5 - 8.1 g/dL 6.8  6.8    Total Bilirubin 0.3 - 1.2 mg/dL 0.4  0.4    Alkaline Phos 38 - 126 U/L 55  57    AST 15 - 41 U/L 24  23    ALT 0 - 44 U/L 20  19      Lab Results  Component Value Date   WBC 5.9 06/17/2022   HGB 13.4 06/17/2022   HCT 40.7 06/17/2022   MCV 92.5 06/17/2022   PLT 330 06/17/2022   NEUTROABS 3.5 06/05/2022    ASSESSMENT & PLAN:  Malignant neoplasm of upper-outer quadrant of right breast in female, estrogen receptor positive (HCC) Dr. Darnelle Catalan patient previously 03/26/2016: T1CN1 stage IIa right breast IDC grade 1 ER/PR positive HER2 negative Ki-67 12%, MammaPrint low risk 03/26/2016: Right lumpectomy 1.4 cm grade 1 IDC with DCIS second right breast cancer: Grade 1 ILC 0.6 cm ER/PR positive HER2 negative Ki-67 3% 04/09/2016: Left breast: Grade 1 through 2 IDC, ER 10%, PR 0%, HER2 negative, Ki-67 2% 04/14/2016: Left axillary lymph node biopsy: Positive for IDC ER/PR negative, MammaPrint low risk 05/20/2016: Left lumpectomy: T1CN1 stage IIa grade 2 IDC ER positive PR negative HER2 negative Ki-67 2% 07/11/2016: Left mastectomy: Multiple foci of residual grade 2 IDC largest 0.7 cm with lymphovascular invasion, reconstruction 07/30/2016 - 09/29/2016: Radiation with capecitabine 04/18/2016: Tamoxifen started discontinued April 2024  endometrial thickening, started anastrozole 11/18/2018 stopped June 2020, resumed tamoxifen   Tamoxifen toxicities: Endometrial hypertrophy: Hysterectomy and bilateral salpingo-oophorectomy 06/26/2022: Uterine fibroid, no malignancy Occasional hot flashes which seem to be getting better with the Effexor.   Breast cancer surveillance: Breast examination 06/08/2023: Benign Right breast mammogram: [facility]/20[facility]: Benign, density category B Currently dealing with issues  around wrist joint causing tendinitis.   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   Return to clinic in 1 year for follow-up    No orders of the defined types were placed in this encounter.  The patient has a good understanding of the overall plan. she agrees with it. she will call with any problems that may develop before the next visit here. Total time spent: 30 mins including face to face time and time spent for planning, charting and co-ordination of care   Tamsen Meek, MD 06/08/23

## 2023-06-08 NOTE — Assessment & Plan Note (Signed)
Dr. Darnelle Tapia patient previously 03/26/2016: T1CN1 stage IIa right breast IDC grade 1 ER/PR positive HER2 negative Ki-67 12%, MammaPrint low risk 03/26/2016: Right lumpectomy 1.4 cm grade 1 IDC with DCIS second right breast cancer: Grade 1 ILC 0.6 cm ER/PR positive HER2 negative Ki-67 3% 04/09/2016: Left breast: Grade 1 through 2 IDC, ER 10%, PR 0%, HER2 negative, Ki-67 2% 04/14/2016: Left axillary lymph node biopsy: Positive for IDC ER/PR negative, MammaPrint low risk 05/20/2016: Left lumpectomy: T1CN1 stage IIa grade 2 IDC ER positive PR negative HER2 negative Ki-67 2% 07/11/2016: Left mastectomy: Multiple foci of residual grade 2 IDC largest 0.7 cm with lymphovascular invasion, reconstruction 07/30/2016 - 09/29/2016: Radiation with capecitabine 04/18/2016: Tamoxifen started discontinued April 2024 endometrial thickening, started anastrozole 11/18/2018 stopped June 2020, resumed tamoxifen   Tamoxifen toxicities: Endometrial hypertrophy: Hysterectomy and bilateral salpingo-oophorectomy 06/26/2022: Uterine fibroid, no malignancy Occasional hot flashes which seem to be getting better with the Effexor.   Breast cancer surveillance: Breast examination 06/08/2023: Benign Right breast mammogram: 07/14/2022 at [facility]: Benign, density category B   She is an avid golfer and a member of the [employer].   Return to clinic in 1 year for follow-up

## 2023-06-09 NOTE — Progress Notes (Unsigned)
Sabrina Tapia D.[name] [address] [place] [address] Phone: [phone]   Assessment and Plan:     There are no diagnoses linked to this encounter.  ***   Pertinent previous records reviewed include ***    Follow Up: ***     Subjective:   I, Sabrina Tapia, am serving as a Neurosurgeon for Doctor Sabrina Tapia   Chief Complaint: piriformis pain    HPI:    10/30/2022 Patient is a 72 year old female complaining of piriformis pain. Patient states that she had a hysterectomy in November , she had a challenging operation, hard recovery, had a toe fusion in January, she spent 4 months on her couch , right side back pain, she played golf Monday and Tuesday and she is locked up, pain is radiating up her back from the glute to the low back , she wants something to calm down the pain , no numbness or tingling,      11/20/2022 Patient states enjoys the meloxicam and stretch combo , would like to see her xrays frm her fall , butts good though    12/29/2022 Patient states elbow is moving well , is seeing Gramig in a week has had a lot of bone growth and is healing well, low back is great since she hasn't played golf , states she has had some pinching sensation in her neck and  decreased ROM    04/20/2023 Patient states that left sided low back pain. She was stretching 3 weeks ago on uneven ground , last week she played golf and instead of squatting she was bending over at the hip, ice helps for a little. Pain radiates down to her hamstring    05/04/2023 Patient states that she is so much better , only had a right side flare which she can control. Wants to finish her 3 weeks of meloxicam    06/10/2023 Patient states   Relevant Historical Information: Recent hysterectomy, recent first toe fusion on right, history of breast cancer  Additional pertinent review of systems negative.   Current Outpatient Medications:    albuterol (PROAIR HFA) 108 (90  Base) MCG/ACT inhaler, Inhale 2 puffs into the lungs every 4 (four) hours as needed for wheezing or shortness of breath., Disp: 1 Inhaler, Rfl: 1   ALPRAZolam (XANAX) 0.25 MG tablet, Take 0.25 mg by mouth daily as needed (travel)., Disp: , Rfl:    amLODipine (NORVASC) 2.5 MG tablet, Take 2.5 mg by mouth daily., Disp: , Rfl:    atorvastatin (LIPITOR) 10 MG tablet, Take 10 mg by mouth at bedtime., Disp: , Rfl:    azelastine (ASTELIN) 0.1 % nasal spray, Place 1 spray into both nostrils daily., Disp: , Rfl:    cetirizine (ZYRTEC) 10 MG tablet, Take 10 mg by mouth at bedtime., Disp: , Rfl:    FLOVENT HFA 110 MCG/ACT inhaler, INHALE 1 PUFF BY MOUTH INTO THE LUNGS TWICE DAILY (Patient taking differently: Inhale 2 puffs into the lungs 2 (two) times daily as needed (shortness of breath).), Disp: 12 g, Rfl: 0   fluticasone (FLONASE) 50 MCG/ACT nasal spray, Place 1 spray into both nostrils daily., Disp: , Rfl:    hydrocortisone (ANUSOL-HC) 2.5 % rectal cream, Place 1 application rectally as needed for hemorrhoids or itching., Disp: , Rfl:    ibuprofen (ADVIL) 200 MG tablet, Take 200 mg by mouth every 6 (six) hours as needed for moderate pain., Disp: , Rfl:    meloxicam (MOBIC)  15 MG tablet, Take 1 tablet (15 mg total) by mouth daily., Disp: 30 tablet, Rfl: 0   omeprazole (PRILOSEC) 20 MG capsule, Take 20 mg by mouth daily., Disp: , Rfl:    OVER THE COUNTER MEDICATION, Take 2 capsules by mouth daily. MEGASPOREBIOTIC, Disp: , Rfl:    psyllium (METAMUCIL) 58.6 % packet, Take 1 packet by mouth daily., Disp: , Rfl:    RESTASIS 0.05 % ophthalmic emulsion, Place 1 drop into both eyes 2 (two) times daily., Disp: , Rfl:    tamoxifen (NOLVADEX) 20 MG tablet, TAKE 1 TABLET(20 MG) BY MOUTH DAILY, Disp: 90 tablet, Rfl: 3   triamcinolone (KENALOG) 0.1 %, Apply 1 Application topically daily as needed (psoriasis)., Disp: , Rfl:    valACYclovir (VALTREX) 1000 MG tablet, Take 1 tablet (1,000 mg total) by mouth 2 (two) times  daily. Take for 1 day as directed above as needed. (Patient taking differently: Take 1,000 mg by mouth 2 (two) times daily as needed (fever blisters).), Disp: 30 tablet, Rfl: 1   valsartan-hydrochlorothiazide (DIOVAN-HCT) 160-12.5 MG tablet, Take 1 tablet by mouth daily., Disp: , Rfl:    venlafaxine XR (EFFEXOR-XR) 75 MG 24 hr capsule, TAKE 1 CAPSULE(75 MG) BY MOUTH DAILY WITH BREAKFAST, Disp: 90 capsule, Rfl: 4   VITAMIN D PO, Take 2,000 Units by mouth daily., Disp: , Rfl:    Objective:     There were no vitals filed for this visit.    There is no height or weight on file to calculate BMI.    Physical Exam:    ***   Electronically signed by:  Sabrina Tapia D.[name] 7:27 AM 06/09/23

## 2023-06-10 ENCOUNTER — Ambulatory Visit: Payer: Medicare PPO | Admitting: Sports Medicine

## 2023-06-10 VITALS — HR 42 | Ht 64.0 in | Wt 162.0 lb

## 2023-06-10 DIAGNOSIS — M1711 Unilateral primary osteoarthritis, right knee: Secondary | ICD-10-CM | POA: Diagnosis not present

## 2023-06-10 DIAGNOSIS — G8929 Other chronic pain: Secondary | ICD-10-CM

## 2023-06-10 DIAGNOSIS — M25561 Pain in right knee: Secondary | ICD-10-CM

## 2023-06-10 NOTE — Patient Instructions (Signed)
Knee HEP  As needed follow up

## 2023-06-11 DIAGNOSIS — M1812 Unilateral primary osteoarthritis of first carpometacarpal joint, left hand: Secondary | ICD-10-CM | POA: Diagnosis not present

## 2023-06-17 NOTE — Progress Notes (Unsigned)
  Cardiology Office Note:  .   Date:  06/17/2023  ID:  AVO VOCI, DOB 03-07-1951, MRN [id number] PCP: Emilio Aspen, MD  [facility] Providers Cardiologist:  None { Click to update primary MD,subspecialty MD or APP then REFRESH:1}   History of Present Illness: .   Sabrina Tapia is a 72 y.o. female with hx of breast cancer, who is being referred by her PCP Dr. Orson Aloe. She also sees Dr. Pamelia Hoit for her  breast cancer. She had a CAC assessment with a score of 34 which is at the 58th precentile  Hx of arrhyhmia?  Father had an MI in his 38s; he developed heart failure. Her mother had heart disease and ILD.  She is a formore smoker, 1 ppd for 24 years  She likes to golf  Cardio-Onc Hx Malignant R/L breast cancer. ER/PR+, HER2 nonamplified diagnosed in 2017 - Anthracycline therapy - N  - Herceptin- N - Radiation Y- Gy R breast 50 Gy; 10 Gy. Left breast 50 Gy - ICI- N - TKI/VEGF- N -5 FU- N   ROS: ***  Studies Reviewed: .        *** Risk Assessment/Calculations:   {Does this patient have ATRIAL FIBRILLATION?:[phone]} No BP recorded.  {Refresh Note OR Click here to enter BP  :1}***       Physical Exam:   VS:  LMP 07/28/1996 (Approximate)    Wt Readings from Last 3 Encounters:  06/10/23 162 lb (73.5 kg)  06/08/23 163 lb 8 oz (74.2 kg)  05/04/23 164 lb (74.4 kg)    GEN: Well nourished, well developed in no acute distress NECK: No JVD; No carotid bruits CARDIAC: ***RRR, no murmurs, rubs, gallops RESPIRATORY:  Clear to auscultation without rales, wheezing or rhonchi  ABDOMEN: Soft, non-tender, non-distended EXTREMITIES:  No edema; No deformity   ASSESSMENT AND PLAN: .   Cardio-Oncology CVD Risk: Smoker hx.; Age. Her CAC is low, only 58th percentile Cardiotoxic Risk: Low Potential Cardiotoxic therapy: None   Surveillance: PRN clinical Baseline BNP N/A  No strong indication for CVD prevention medication No indication for a cardiac work up  at this time    {Are you ordering a CV Procedure (e.g. stress test, cath, DCCV, TEE, etc)?   Press F2        :[id number]}  Dispo: ***  Signed, Maisie Fus, MD

## 2023-06-18 ENCOUNTER — Ambulatory Visit: Payer: Medicare PPO | Attending: Internal Medicine | Admitting: Internal Medicine

## 2023-06-18 ENCOUNTER — Ambulatory Visit: Payer: Medicare PPO | Attending: Internal Medicine

## 2023-06-18 ENCOUNTER — Encounter: Payer: Self-pay | Admitting: Internal Medicine

## 2023-06-18 VITALS — BP 132/80 | HR 70 | Ht 64.0 in | Wt 164.0 lb

## 2023-06-18 DIAGNOSIS — R002 Palpitations: Secondary | ICD-10-CM | POA: Diagnosis not present

## 2023-06-18 DIAGNOSIS — R12 Heartburn: Secondary | ICD-10-CM | POA: Diagnosis not present

## 2023-06-18 NOTE — Progress Notes (Unsigned)
Enrolled for [employer] to mail a ZIO XT long term holter monitor to the patients address on file.  

## 2023-06-18 NOTE — Patient Instructions (Signed)
Medication Instructions:  No changes  *If you need a refill on your cardiac medications before your next appointment, please call your pharmacy*   Lab Work: None    Testing/Procedures: Christena Deem- Long Term Monitor Instructions  Your physician has requested you wear a ZIO patch monitor for 14 days.  This is a single patch monitor. [employer] supplies one patch monitor per enrollment. Additional stickers are not available. Please do not apply patch if you will be having a Nuclear Stress Test,  Echocardiogram, Cardiac CT, MRI, or Chest Xray during the period you would be wearing the  monitor. The patch cannot be worn during these tests. You cannot remove and re-apply the  ZIO XT patch monitor.  Your ZIO patch monitor will be mailed 3 day USPS to your address on file. It may take 3-5 days  to receive your monitor after you have been enrolled.  Once you have received your monitor, please review the enclosed instructions. Your monitor  has already been registered assigning a specific monitor serial # to you.  Billing and Patient Assistance Program Information  We have supplied [employer] with any of your insurance information on file for billing purposes. [employer] offers a sliding scale Patient Assistance Program for patients that do not have  insurance, or whose insurance does not completely cover the cost of the ZIO monitor.  You must apply for the Patient Assistance Program to qualify for this discounted rate.  To apply, please call [employer] at [phone], select option 4, select option 2, ask to apply for  Patient Assistance Program. Meredeth Ide will ask your household income, and how many people  are in your household. They will quote your out-of-pocket cost based on that information.  [employer] will also be able to set up a 61-month, interest-free payment plan if needed.  Applying the monitor   Shave hair from upper left chest.  Hold abrader disc by orange tab. Rub abrader in 40 strokes over  the upper left chest as  indicated in your monitor instructions.  Clean area with 4 enclosed alcohol pads. Let dry.  Apply patch as indicated in monitor instructions. Patch will be placed under collarbone on left  side of chest with arrow pointing upward.  Rub patch adhesive wings for 2 minutes. Remove white label marked "1". Remove the white  label marked "2". Rub patch adhesive wings for 2 additional minutes.  While looking in a mirror, press and release button in center of patch. A small green light will  flash 3-4 times. This will be your only indicator that the monitor has been turned on.  Do not shower for the first 24 hours. You may shower after the first 24 hours.  Press the button if you feel a symptom. You will hear a small click. Record Date, Time and  Symptom in the Patient Logbook.  When you are ready to remove the patch, follow instructions on the last 2 pages of Patient  Logbook. Stick patch monitor onto the last page of Patient Logbook.  Place Patient Logbook in the blue and white box. Use locking tab on box and tape box closed  securely. The blue and white box has prepaid postage on it. Please place it in the mailbox as  soon as possible. Your physician should have your test results approximately 7 days after the  monitor has been mailed back to [facility].  Call [facility] Customer Care at [phone] if you have questions regarding  your ZIO XT patch monitor. Call them  immediately if you see an orange light blinking on your  monitor.  If your monitor falls off in less than 4 days, contact our Monitor department at [phone].  If your monitor becomes loose or falls off after 4 days call [employer] at [phone] for  suggestions on securing your monitor    Follow-Up: At [facility], you and your health needs are our priority.  As part of our continuing mission to provide you with exceptional heart care, we have created designated Provider Care  Teams.  These Care Teams include your primary Cardiologist (physician) and Advanced Practice Providers (APPs -  Physician Assistants and Nurse Practitioners) who all work together to provide you with the care you need, when you need it.    Your next appointment:   3 month(s)  Provider:   Dr.Mary Wyline Mood

## 2023-06-30 DIAGNOSIS — I671 Cerebral aneurysm, nonruptured: Secondary | ICD-10-CM | POA: Diagnosis not present

## 2023-07-03 DIAGNOSIS — R002 Palpitations: Secondary | ICD-10-CM

## 2023-07-07 DIAGNOSIS — H40013 Open angle with borderline findings, low risk, bilateral: Secondary | ICD-10-CM | POA: Diagnosis not present

## 2023-07-07 DIAGNOSIS — Z961 Presence of intraocular lens: Secondary | ICD-10-CM | POA: Diagnosis not present

## 2023-07-17 DIAGNOSIS — Z9181 History of falling: Secondary | ICD-10-CM | POA: Diagnosis not present

## 2023-07-17 DIAGNOSIS — M8580 Other specified disorders of bone density and structure, unspecified site: Secondary | ICD-10-CM | POA: Diagnosis not present

## 2023-07-17 DIAGNOSIS — Z0000 Encounter for general adult medical examination without abnormal findings: Secondary | ICD-10-CM | POA: Diagnosis not present

## 2023-07-17 DIAGNOSIS — Z1331 Encounter for screening for depression: Secondary | ICD-10-CM | POA: Diagnosis not present

## 2023-07-17 DIAGNOSIS — I10 Essential (primary) hypertension: Secondary | ICD-10-CM | POA: Diagnosis not present

## 2023-07-17 DIAGNOSIS — E559 Vitamin D deficiency, unspecified: Secondary | ICD-10-CM | POA: Diagnosis not present

## 2023-07-17 DIAGNOSIS — Z79899 Other long term (current) drug therapy: Secondary | ICD-10-CM | POA: Diagnosis not present

## 2023-07-17 DIAGNOSIS — I471 Supraventricular tachycardia, unspecified: Secondary | ICD-10-CM | POA: Diagnosis not present

## 2023-07-17 DIAGNOSIS — E7800 Pure hypercholesterolemia, unspecified: Secondary | ICD-10-CM | POA: Diagnosis not present

## 2023-07-17 DIAGNOSIS — I700 Atherosclerosis of aorta: Secondary | ICD-10-CM | POA: Diagnosis not present

## 2023-07-17 DIAGNOSIS — Z1389 Encounter for screening for other disorder: Secondary | ICD-10-CM | POA: Diagnosis not present

## 2023-07-17 DIAGNOSIS — C50411 Malignant neoplasm of upper-outer quadrant of right female breast: Secondary | ICD-10-CM | POA: Diagnosis not present

## 2023-07-17 DIAGNOSIS — D0511 Intraductal carcinoma in situ of right breast: Secondary | ICD-10-CM | POA: Diagnosis not present

## 2023-07-17 DIAGNOSIS — J432 Centrilobular emphysema: Secondary | ICD-10-CM | POA: Diagnosis not present

## 2023-07-17 DIAGNOSIS — Z23 Encounter for immunization: Secondary | ICD-10-CM | POA: Diagnosis not present

## 2023-07-17 LAB — LAB REPORT - SCANNED
Creatinine, POC: 31 mg/dL
EGFR: 88
Microalb Creat Ratio: <22.7
Microalbumin, Urine: <0.7

## 2023-07-21 DIAGNOSIS — R002 Palpitations: Secondary | ICD-10-CM | POA: Diagnosis not present

## 2023-07-23 DIAGNOSIS — C50912 Malignant neoplasm of unspecified site of left female breast: Secondary | ICD-10-CM | POA: Diagnosis not present

## 2023-07-23 DIAGNOSIS — Z9011 Acquired absence of right breast and nipple: Secondary | ICD-10-CM | POA: Diagnosis not present

## 2023-08-10 DIAGNOSIS — I671 Cerebral aneurysm, nonruptured: Secondary | ICD-10-CM | POA: Diagnosis not present

## 2023-08-12 DIAGNOSIS — Z1231 Encounter for screening mammogram for malignant neoplasm of breast: Secondary | ICD-10-CM | POA: Diagnosis not present

## 2023-08-17 ENCOUNTER — Encounter: Payer: Self-pay | Admitting: Hematology and Oncology

## 2023-08-17 DIAGNOSIS — M79674 Pain in right toe(s): Secondary | ICD-10-CM | POA: Diagnosis not present

## 2023-08-17 DIAGNOSIS — M79675 Pain in left toe(s): Secondary | ICD-10-CM | POA: Diagnosis not present

## 2023-09-18 ENCOUNTER — Encounter: Payer: Self-pay | Admitting: Internal Medicine

## 2023-09-18 ENCOUNTER — Ambulatory Visit: Payer: Medicare PPO | Attending: Internal Medicine | Admitting: Internal Medicine

## 2023-09-18 VITALS — BP 124/88 | HR 88 | Ht 63.0 in | Wt 161.4 lb

## 2023-09-18 DIAGNOSIS — R002 Palpitations: Secondary | ICD-10-CM

## 2023-09-18 NOTE — Progress Notes (Signed)
  Cardiology Office Note:  .   Date:  09/18/2023  ID:  Sabrina Tapia, DOB 03/02/1951, MRN [id number] PCP: Emilio Aspen, MD  [facility] Providers Cardiologist:  None    History of Present Illness: .   Sabrina Tapia is a 73 y.o. female with hx of breast cancer, who is being referred by her PCP Dr. Orson Aloe. She also sees Dr. Pamelia Tapia for her  breast cancer. She had a CAC assessment with a score of 34 which is at the 58th precentile  She notes she had palpitations. She had a cardiac monitor with [employer]. She still has palpitations occasionally.   He denies angina, dyspnea on exertion, lower extremity edema, PND or orthopnea.   Father had an MI in his 3s. Her mother had heart failure and COPD. Her mother was a smoker.  She is a formore smoker, 1 ppd for 24 years  She likes to golf  Cardio-Onc Hx Malignant R/L breast cancer. ER/PR+, HER2 nonamplified diagnosed in 2017. Was just on tamoxifen.  - Anthracycline therapy - N  - Herceptin- N - Radiation Y- Gy R breast 50 Gy; 10 Gy. Left breast 50 Gy - ICI- N - TKI/VEGF- N -5 FU- N   ROS:  per HPI otherwise negative  Interim hx 09/18/2023 She was at a wellness retreat.She notes some sob/palps with exercise. No syncope. She is staying active.    Studies Reviewed: .          Risk Assessment/Calculations:         Physical Exam:   VS:  Vitals:   09/18/23 1607  BP: 124/88  Pulse: 88  SpO2: 96%     LMP 07/28/1996 (Approximate)    Wt Readings from Last 3 Encounters:  06/18/23 164 lb (74.4 kg)  06/10/23 162 lb (73.5 kg)  06/08/23 163 lb 8 oz (74.2 kg)    GEN: Well nourished, well developed in no acute distress NECK: No JVD; No carotid bruits CARDIAC: RRR, no murmurs, rubs, gallops RESPIRATORY:  Clear to auscultation without rales, wheezing or rhonchi  ABDOMEN: Soft, non-tender, non-distended EXTREMITIES:  No edema; No deformity   ASSESSMENT AND PLAN: .   Cardio-Oncology CVD Risk: Smoker hx.;  Age. Her CAC is low, only 58th percentile. Discussed does not have have to take statin.  Cardiotoxic Risk: Low Potential Cardiotoxic therapy: None   Surveillance: PRN clinical Baseline BNP N/A  Palpitations Benign, she had a cardiac monitor that showed minor ectopy.  We discussed reducing caffeine prior to exercise as well as hydrating with electrolytes.  She is doing well overall and we discussed that she can follow up as needed.  Dispo: Follow up PRN  Signed, Maisie Fus, MD

## 2023-09-18 NOTE — Patient Instructions (Signed)
 Medication Instructions:  Your physician recommends that you continue on your current medications as directed. Please refer to the Current Medication list given to you today.    *If you need a refill on your cardiac medications before your next appointment, please call your pharmacy*   Lab Work: None    If you have labs (blood work) drawn today and your tests are completely normal, you will receive your results only by: MyChart Message (if you have MyChart) OR A paper copy in the mail If you have any lab test that is abnormal or we need to change your treatment, we will call you to review the results.   Testing/Procedures: None    Follow-Up: At [facility], you and your health needs are our priority.  As part of our continuing mission to provide you with exceptional heart care, we have created designated Provider Care Teams.  These Care Teams include your primary Cardiologist (physician) and Advanced Practice Providers (APPs -  Physician Assistants and Nurse Practitioners) who all work together to provide you with the care you need, when you need it.  We recommend signing up for the patient portal called "MyChart".  Sign up information is provided on this After Visit Summary.  MyChart is used to connect with patients for Virtual Visits (Telemedicine).  Patients are able to view lab/test results, encounter notes, upcoming appointments, etc.  Non-urgent messages can be sent to your provider as well.   To learn more about what you can do with MyChart, go to [email or web address].    Your next appointment:   Follow up as needed   Provider:   Carolan Clines, MD    Other Instructions

## 2023-10-05 ENCOUNTER — Encounter: Payer: Self-pay | Admitting: Sports Medicine

## 2023-10-05 ENCOUNTER — Other Ambulatory Visit: Payer: Self-pay | Admitting: Sports Medicine

## 2023-10-05 MED ORDER — MELOXICAM 15 MG PO TABS: 15.0000 mg | ORAL_TABLET | Freq: Every day | ORAL | 0 refills | Status: DC

## 2023-10-05 NOTE — Telephone Encounter (Signed)
 Refill placed

## 2023-10-05 NOTE — Progress Notes (Unsigned)
 Refill placed

## 2023-10-19 DIAGNOSIS — Z85828 Personal history of other malignant neoplasm of skin: Secondary | ICD-10-CM | POA: Diagnosis not present

## 2023-10-19 DIAGNOSIS — L821 Other seborrheic keratosis: Secondary | ICD-10-CM | POA: Diagnosis not present

## 2023-10-19 DIAGNOSIS — Z86018 Personal history of other benign neoplasm: Secondary | ICD-10-CM | POA: Diagnosis not present

## 2023-10-19 DIAGNOSIS — D2271 Melanocytic nevi of right lower limb, including hip: Secondary | ICD-10-CM | POA: Diagnosis not present

## 2023-10-19 DIAGNOSIS — D225 Melanocytic nevi of trunk: Secondary | ICD-10-CM | POA: Diagnosis not present

## 2023-10-19 DIAGNOSIS — L814 Other melanin hyperpigmentation: Secondary | ICD-10-CM | POA: Diagnosis not present

## 2023-10-19 DIAGNOSIS — L578 Other skin changes due to chronic exposure to nonionizing radiation: Secondary | ICD-10-CM | POA: Diagnosis not present

## 2023-10-30 DIAGNOSIS — R310 Gross hematuria: Secondary | ICD-10-CM | POA: Diagnosis not present

## 2023-12-25 ENCOUNTER — Other Ambulatory Visit: Payer: Self-pay | Admitting: Hematology and Oncology

## 2024-01-22 DIAGNOSIS — I2510 Atherosclerotic heart disease of native coronary artery without angina pectoris: Secondary | ICD-10-CM | POA: Diagnosis not present

## 2024-01-22 DIAGNOSIS — G4700 Insomnia, unspecified: Secondary | ICD-10-CM | POA: Diagnosis not present

## 2024-01-22 DIAGNOSIS — M8580 Other specified disorders of bone density and structure, unspecified site: Secondary | ICD-10-CM | POA: Diagnosis not present

## 2024-01-22 DIAGNOSIS — I10 Essential (primary) hypertension: Secondary | ICD-10-CM | POA: Diagnosis not present

## 2024-01-22 DIAGNOSIS — E7800 Pure hypercholesterolemia, unspecified: Secondary | ICD-10-CM | POA: Diagnosis not present

## 2024-01-22 DIAGNOSIS — G4730 Sleep apnea, unspecified: Secondary | ICD-10-CM | POA: Diagnosis not present

## 2024-01-22 DIAGNOSIS — D0511 Intraductal carcinoma in situ of right breast: Secondary | ICD-10-CM | POA: Diagnosis not present

## 2024-01-22 DIAGNOSIS — E559 Vitamin D deficiency, unspecified: Secondary | ICD-10-CM | POA: Diagnosis not present

## 2024-01-22 DIAGNOSIS — J309 Allergic rhinitis, unspecified: Secondary | ICD-10-CM | POA: Diagnosis not present

## 2024-01-26 NOTE — Progress Notes (Unsigned)
 Sabrina [facility] [address] [place] [address] Phone: [phone]   Assessment and Plan:     There are no diagnoses linked to this encounter.  ***   Pertinent previous records reviewed include ***    Follow Up: ***     Subjective:   I, Sabrina Tapia, am serving as a Neurosurgeon for Doctor Morene Mace   Chief Complaint: piriformis pain    HPI:    10/30/2022 Patient is a 73 year old female complaining of piriformis pain. Patient states that she had a hysterectomy in November , she had a challenging operation, hard recovery, had a toe fusion in January, she spent 4 months on her couch , right side back pain, she played golf Monday and Tuesday and she is locked up, pain is radiating up her back from the glute to the low back , she wants something to calm down the pain , no numbness or tingling,      11/20/2022 Patient states enjoys the meloxicam  and stretch combo , would like to see her xrays frm her fall , butts good though    12/29/2022 Patient states elbow is moving well , is seeing Gramig in a week has had a lot of bone growth and is healing well, low back is great since she hasn't played golf , states she has had some pinching sensation in her neck and  decreased ROM    04/20/2023 Patient states that left sided low back pain. She was stretching 3 weeks ago on uneven ground , last week she played golf and instead of squatting she was bending over at the hip, ice helps for a little. Pain radiates down to her hamstring    05/04/2023 Patient states that she is so much better , only had a right side flare which she can control. Wants to finish her 3 weeks of meloxicam     06/10/2023 Patient states here for a knee CSI   01/27/2024 Patient states   Relevant Historical Information: Recent hysterectomy, recent first toe fusion on right, history of breast cancer  Additional pertinent review of systems negative.   Current Outpatient  Medications:    albuterol  (PROAIR  HFA) 108 (90 Base) MCG/ACT inhaler, Inhale 2 puffs into the lungs every 4 (four) hours as needed for wheezing or shortness of breath., Disp: 1 Inhaler, Rfl: 1   ALPRAZolam  (XANAX ) 0.25 MG tablet, Take 0.25 mg by mouth daily as needed (travel)., Disp: , Rfl:    amLODipine (NORVASC) 2.5 MG tablet, Take 2.5 mg by mouth daily., Disp: , Rfl:    atorvastatin (LIPITOR) 10 MG tablet, Take 10 mg by mouth at bedtime., Disp: , Rfl:    azelastine  (ASTELIN ) 0.1 % nasal spray, Place 1 spray into both nostrils daily., Disp: , Rfl:    cetirizine (ZYRTEC) 10 MG tablet, Take 10 mg by mouth at bedtime., Disp: , Rfl:    FLOVENT  HFA 110 MCG/ACT inhaler, INHALE 1 PUFF BY MOUTH INTO THE LUNGS TWICE DAILY (Patient taking differently: Inhale 2 puffs into the lungs 2 (two) times daily as needed (shortness of breath).), Disp: 12 g, Rfl: 0   fluticasone  (FLONASE ) 50 MCG/ACT nasal spray, Place 1 spray into both nostrils daily., Disp: , Rfl:    hydrocortisone  (ANUSOL -HC) 2.5 % rectal cream, Place 1 application rectally as needed for hemorrhoids or itching., Disp: , Rfl:    ibuprofen  (ADVIL ) 200 MG tablet, Take 200 mg by mouth every 6 (six) hours as needed for  moderate pain., Disp: , Rfl:    meloxicam  (MOBIC ) 15 MG tablet, Take 1 tablet (15 mg total) by mouth daily., Disp: 30 tablet, Rfl: 0   omeprazole (PRILOSEC) 20 MG capsule, Take 20 mg by mouth daily., Disp: , Rfl:    OVER THE COUNTER MEDICATION, Take 2 capsules by mouth daily. MEGASPOREBIOTIC, Disp: , Rfl:    psyllium (METAMUCIL) 58.6 % packet, Take 1 packet by mouth daily., Disp: , Rfl:    RESTASIS 0.05 % ophthalmic emulsion, Place 1 drop into both eyes 2 (two) times daily., Disp: , Rfl:    tamoxifen  (NOLVADEX ) 20 MG tablet, TAKE 1 TABLET(20 MG) BY MOUTH DAILY, Disp: 90 tablet, Rfl: 3   triamcinolone  (KENALOG) 0.1 %, Apply 1 Application topically daily as needed (psoriasis)., Disp: , Rfl:    valACYclovir  (VALTREX ) 1000 MG tablet, Take 1  tablet (1,000 mg total) by mouth 2 (two) times daily. Take for 1 day as directed above as needed. (Patient taking differently: Take 1,000 mg by mouth 2 (two) times daily as needed (fever blisters).), Disp: 30 tablet, Rfl: 1   valsartan-hydrochlorothiazide (DIOVAN-HCT) 160-12.5 MG tablet, Take 1 tablet by mouth daily., Disp: , Rfl:    venlafaxine  XR (EFFEXOR -XR) 75 MG 24 hr capsule, TAKE 1 CAPSULE(75 MG) BY MOUTH DAILY WITH BREAKFAST, Disp: 90 capsule, Rfl: 4   VITAMIN D PO, Take 2,000 Units by mouth daily., Disp: , Rfl:    Objective:     There were no vitals filed for this visit.    There is no height or weight on file to calculate BMI.    Physical Exam:    ***   Electronically signed by:  Sabrina Tapia 4:04 PM 01/26/24

## 2024-01-27 ENCOUNTER — Ambulatory Visit: Admitting: Sports Medicine

## 2024-01-27 ENCOUNTER — Ambulatory Visit (INDEPENDENT_AMBULATORY_CARE_PROVIDER_SITE_OTHER)

## 2024-01-27 VITALS — BP 110/62 | HR 73 | Ht 63.0 in | Wt 161.0 lb

## 2024-01-27 DIAGNOSIS — G8929 Other chronic pain: Secondary | ICD-10-CM | POA: Diagnosis not present

## 2024-01-27 DIAGNOSIS — M25562 Pain in left knee: Secondary | ICD-10-CM

## 2024-01-27 MED ORDER — MELOXICAM 15 MG PO TABS: 15.0000 mg | ORAL_TABLET | Freq: Every day | ORAL | 0 refills | Status: AC

## 2024-01-27 NOTE — Patient Instructions (Signed)
-   Start meloxicam  15 mg daily x2 weeks.  If still having pain after 2 weeks, complete 3rd-week of NSAID. May use remaining NSAID as needed once daily for pain control.  Do not to use additional over-the-counter NSAIDs (ibuprofen , naproxen, Advil , Aleve, etc.) while taking prescription NSAIDs.  May use Tylenol  [phone] mg 2 to 3 times a day for breakthrough pain.  Hep knee. Follow up in 4 weeks.

## 2024-01-28 ENCOUNTER — Ambulatory Visit: Payer: Self-pay | Admitting: Sports Medicine

## 2024-02-05 ENCOUNTER — Other Ambulatory Visit: Payer: Self-pay | Admitting: Hematology and Oncology

## 2024-02-22 ENCOUNTER — Ambulatory Visit: Admitting: Sports Medicine

## 2024-03-10 NOTE — Progress Notes (Signed)
 Ben Kaleeah Gingerich D.[facility] [address] [place] [address] Phone: [phone]   Assessment and Plan:     1. Cervical spine pain 2. DDD (degenerative disc disease), cervical 3. Strain of left trapezius muscle, initial encounter 4. Chronic left shoulder pain -Chronic with exacerbation, initial visit - Most consistent with flare of mild cervical spine DDD most pronounced at C5-C7, leading to left-sided trapezius strain and left shoulder dysfunction - Start HEP for neck, trapezius, shoulder - Patient recently completed meloxicam  course for knee pain, so do not recommend repeating meloxicam  course at this time - Start prednisone Dosepak - X-ray obtained in clinic.  My interpretation: No acute fracture or vertebral collapse.  Mild degenerative changes at C5, C6, C7  15 additional minutes spent for educating Therapeutic Home Exercise Program.  This included exercises focusing on stretching, strengthening, with focus on eccentric aspects.   Long term goals include an improvement in range of motion, strength, endurance as well as avoiding reinjury. Patient's frequency would include in 1-2 times a day, 3-5 times a week for a duration of 6-12 weeks. Proper technique shown and discussed handout in great detail with ATC.  All questions were discussed and answered.     Pertinent previous records reviewed include none  Follow Up: 4 weeks for reevaluation.  Could consider physical therapy versus trigger point ejections   Subjective:   I, Moenique Parris, am serving as a Neurosurgeon for Doctor Morene Mace  Chief Complaint: upper trap pain   HPI:   03/11/2024 Patient is a 73 year old female with upper trap pain. Patient states that she is having cervical and L shoulder pain for past 6 weeks. Took meloxicam  for 8 days which was helpful. Pain occurs with cervical rotation. Still able to workout as pain is always after activity. Had 2 migraines in past month. Hx  L shoulder arthroscopy.   Relevant Historical Information: History of breast cancer  Additional pertinent review of systems negative.   Current Outpatient Medications:    albuterol  (PROAIR  HFA) 108 (90 Base) MCG/ACT inhaler, Inhale 2 puffs into the lungs every 4 (four) hours as needed for wheezing or shortness of breath., Disp: 1 Inhaler, Rfl: 1   ALPRAZolam  (XANAX ) 0.25 MG tablet, Take 0.25 mg by mouth daily as needed (travel)., Disp: , Rfl:    amLODipine (NORVASC) 2.5 MG tablet, Take 2.5 mg by mouth daily., Disp: , Rfl:    atorvastatin (LIPITOR) 10 MG tablet, Take 10 mg by mouth at bedtime., Disp: , Rfl:    azelastine  (ASTELIN ) 0.1 % nasal spray, Place 1 spray into both nostrils daily., Disp: , Rfl:    cetirizine (ZYRTEC) 10 MG tablet, Take 10 mg by mouth at bedtime., Disp: , Rfl:    FLOVENT  HFA 110 MCG/ACT inhaler, INHALE 1 PUFF BY MOUTH INTO THE LUNGS TWICE DAILY (Patient taking differently: Inhale 2 puffs into the lungs 2 (two) times daily as needed (shortness of breath).), Disp: 12 g, Rfl: 0   fluticasone  (FLONASE ) 50 MCG/ACT nasal spray, Place 1 spray into both nostrils daily., Disp: , Rfl:    hydrocortisone  (ANUSOL -HC) 2.5 % rectal cream, Place 1 application rectally as needed for hemorrhoids or itching., Disp: , Rfl:    ibuprofen  (ADVIL ) 200 MG tablet, Take 200 mg by mouth every 6 (six) hours as needed for moderate pain., Disp: , Rfl:    meloxicam  (MOBIC ) 15 MG tablet, Take 1 tablet (15 mg total) by mouth daily., Disp: 30 tablet, Rfl: 0  methylPREDNISolone  (MEDROL  DOSEPAK) 4 MG TBPK tablet, Take 6 tablets on day 1.  Take 5 tablets on day 2.  Take 4 tablets on day 3.  Take 3 tablets on day 4.  Take 2 tablets on day 5.  Take 1 tablet on day 6., Disp: 21 tablet, Rfl: 0   omeprazole (PRILOSEC) 20 MG capsule, Take 20 mg by mouth daily., Disp: , Rfl:    OVER THE COUNTER MEDICATION, Take 2 capsules by mouth daily. MEGASPOREBIOTIC, Disp: , Rfl:    psyllium (METAMUCIL) 58.6 % packet, Take 1  packet by mouth daily., Disp: , Rfl:    RESTASIS 0.05 % ophthalmic emulsion, Place 1 drop into both eyes 2 (two) times daily., Disp: , Rfl:    tamoxifen  (NOLVADEX ) 20 MG tablet, TAKE 1 TABLET(20 MG) BY MOUTH DAILY, Disp: 90 tablet, Rfl: 3   triamcinolone  (KENALOG) 0.1 %, Apply 1 Application topically daily as needed (psoriasis)., Disp: , Rfl:    valACYclovir  (VALTREX ) 1000 MG tablet, Take 1 tablet (1,000 mg total) by mouth 2 (two) times daily. Take for 1 day as directed above as needed. (Patient taking differently: Take 1,000 mg by mouth 2 (two) times daily as needed (fever blisters).), Disp: 30 tablet, Rfl: 1   valsartan-hydrochlorothiazide (DIOVAN-HCT) 160-12.5 MG tablet, Take 1 tablet by mouth daily., Disp: , Rfl:    venlafaxine  XR (EFFEXOR -XR) 75 MG 24 hr capsule, TAKE 1 CAPSULE(75 MG) BY MOUTH DAILY WITH BREAKFAST, Disp: 90 capsule, Rfl: 4   VITAMIN D PO, Take 2,000 Units by mouth daily., Disp: , Rfl:    Objective:     Vitals:   03/11/24 0910  BP: 108/74  Pulse: 77  SpO2: 98%  Weight: 161 lb (73 kg)  Height: 5' 3 (1.6 m)      Body mass index is 28.52 kg/m.    Physical Exam:    Neck Exam: Cervical Spine- Posture normal Skin- normal, intact  Neuro:  Strength-  Right Left   Deltoid (C5) 5/5 5/5  Bicep/Brachioradialis (C5/6) 5/5  5/5  Wrist Extension (C6) 5/5 5/5  Tricep (C7) 5/5 5/5  Wrist Flexion (C7) 5/5 5/5  Grip (C8) 5/5 5/5  Finger Abduction (T1) 5/5 5/5   Sensation: intact to light touch in upper extremities bilaterally  Spurling's:  negative bilaterally Neck ROM: Full active ROM TTP: Left trapezius, left cervical paraspinal NTTP: cervical spinous processes, and right cervical paraspinal, thoracic paraspinal, trapezius    Electronically signed by:  [name] 10:12 AM 03/11/24

## 2024-03-11 ENCOUNTER — Ambulatory Visit

## 2024-03-11 ENCOUNTER — Ambulatory Visit: Admitting: Sports Medicine

## 2024-03-11 VITALS — BP 108/74 | HR 77 | Temp 98.1°F | Resp 22 | Ht 63.0 in | Wt 161.0 lb

## 2024-03-11 DIAGNOSIS — M542 Cervicalgia: Secondary | ICD-10-CM

## 2024-03-11 DIAGNOSIS — M5030 Other cervical disc degeneration, unspecified cervical region: Secondary | ICD-10-CM | POA: Diagnosis not present

## 2024-03-11 DIAGNOSIS — M47812 Spondylosis without myelopathy or radiculopathy, cervical region: Secondary | ICD-10-CM | POA: Diagnosis not present

## 2024-03-11 DIAGNOSIS — S46812A Strain of other muscles, fascia and tendons at shoulder and upper arm level, left arm, initial encounter: Secondary | ICD-10-CM

## 2024-03-11 DIAGNOSIS — M25512 Pain in left shoulder: Secondary | ICD-10-CM | POA: Diagnosis not present

## 2024-03-11 DIAGNOSIS — G8929 Other chronic pain: Secondary | ICD-10-CM | POA: Diagnosis not present

## 2024-03-11 MED ORDER — METHYLPREDNISOLONE 4 MG PO TBPK: ORAL_TABLET | ORAL | 0 refills | Status: DC

## 2024-03-11 NOTE — Patient Instructions (Signed)
 Prednisone dos pak   Neck HEP   4 week follow up

## 2024-03-15 ENCOUNTER — Ambulatory Visit: Admitting: Sports Medicine

## 2024-03-25 ENCOUNTER — Ambulatory Visit: Payer: Self-pay | Admitting: Sports Medicine

## 2024-04-04 NOTE — Progress Notes (Unsigned)
 [facility] [address] [place] [address] Phone: [phone]   Assessment and Plan:     1. Cervicalgia (Primary) 2. Cervical spine pain 3. DDD (degenerative disc disease), cervical -Chronic with exacerbation, subsequent visit - Still most consistent with flare of mild cervical spine DDD most pronounced at C5-C7.  Decrease in trapezius pain after completing prednisone course, however continued neck pain leading to cervicogenic headaches - Start meloxicam  15 mg daily x2 weeks.  If still having pain after 2 weeks, complete 3rd-week of NSAID. May use remaining NSAID as needed once daily for pain control.  Do not to use additional over-the-counter NSAIDs (ibuprofen , naproxen, Advil , Aleve, etc.) while taking prescription NSAIDs.  May use Tylenol  [phone] mg 2 to 3 times a day for breakthrough pain. -Continue HEP and start physical therapy for neck - Recommend further evaluation with C-spine MRI due to failure to improve despite >6 weeks of conservative therapy, pain with day-to-day activities, pain >6/10    Pertinent previous records reviewed include none   Follow Up: 5 days after MRI to review results and discuss treatment plan.  Could consider epidural CSI   Subjective:   I, Mechel Haggard, am serving as a Neurosurgeon for Doctor Morene Mace   Chief Complaint: upper trap pain    HPI:    03/11/2024 Patient is a 73 year old female with upper trap pain. Patient states that she is having cervical and L shoulder pain for past 6 weeks. Took meloxicam  for 8 days which was helpful. Pain occurs with cervical rotation. Still able to workout as pain is always after activity. Had 2 migraines in past month. Hx L shoulder arthroscopy.   04/05/2024 Patient states neck pain is a little better but not much    Relevant Historical Information: History of breast cancer  Additional pertinent review of systems negative.   Current Outpatient  Medications:    meloxicam  (MOBIC ) 15 MG tablet, Take 1 tablet (15 mg total) by mouth daily., Disp: 30 tablet, Rfl: 0   albuterol  (PROAIR  HFA) 108 (90 Base) MCG/ACT inhaler, Inhale 2 puffs into the lungs every 4 (four) hours as needed for wheezing or shortness of breath., Disp: 1 Inhaler, Rfl: 1   ALPRAZolam  (XANAX ) 0.25 MG tablet, Take 0.25 mg by mouth daily as needed (travel)., Disp: , Rfl:    amLODipine (NORVASC) 2.5 MG tablet, Take 2.5 mg by mouth daily., Disp: , Rfl:    atorvastatin (LIPITOR) 10 MG tablet, Take 10 mg by mouth at bedtime., Disp: , Rfl:    azelastine  (ASTELIN ) 0.1 % nasal spray, Place 1 spray into both nostrils daily., Disp: , Rfl:    cetirizine (ZYRTEC) 10 MG tablet, Take 10 mg by mouth at bedtime., Disp: , Rfl:    FLOVENT  HFA 110 MCG/ACT inhaler, INHALE 1 PUFF BY MOUTH INTO THE LUNGS TWICE DAILY (Patient taking differently: Inhale 2 puffs into the lungs 2 (two) times daily as needed (shortness of breath).), Disp: 12 g, Rfl: 0   fluticasone  (FLONASE ) 50 MCG/ACT nasal spray, Place 1 spray into both nostrils daily., Disp: , Rfl:    hydrocortisone  (ANUSOL -HC) 2.5 % rectal cream, Place 1 application rectally as needed for hemorrhoids or itching., Disp: , Rfl:    ibuprofen  (ADVIL ) 200 MG tablet, Take 200 mg by mouth every 6 (six) hours as needed for moderate pain., Disp: , Rfl:    meloxicam  (MOBIC ) 15 MG tablet, Take 1 tablet (15 mg total) by mouth daily., Disp: 30  tablet, Rfl: 0   methylPREDNISolone  (MEDROL  DOSEPAK) 4 MG TBPK tablet, Take 6 tablets on day 1.  Take 5 tablets on day 2.  Take 4 tablets on day 3.  Take 3 tablets on day 4.  Take 2 tablets on day 5.  Take 1 tablet on day 6., Disp: 21 tablet, Rfl: 0   omeprazole (PRILOSEC) 20 MG capsule, Take 20 mg by mouth daily., Disp: , Rfl:    OVER THE COUNTER MEDICATION, Take 2 capsules by mouth daily. MEGASPOREBIOTIC, Disp: , Rfl:    psyllium (METAMUCIL) 58.6 % packet, Take 1 packet by mouth daily., Disp: , Rfl:    RESTASIS 0.05 %  ophthalmic emulsion, Place 1 drop into both eyes 2 (two) times daily., Disp: , Rfl:    tamoxifen  (NOLVADEX ) 20 MG tablet, TAKE 1 TABLET(20 MG) BY MOUTH DAILY, Disp: 90 tablet, Rfl: 3   triamcinolone  (KENALOG) 0.1 %, Apply 1 Application topically daily as needed (psoriasis)., Disp: , Rfl:    valACYclovir  (VALTREX ) 1000 MG tablet, Take 1 tablet (1,000 mg total) by mouth 2 (two) times daily. Take for 1 day as directed above as needed. (Patient taking differently: Take 1,000 mg by mouth 2 (two) times daily as needed (fever blisters).), Disp: 30 tablet, Rfl: 1   valsartan-hydrochlorothiazide (DIOVAN-HCT) 160-12.5 MG tablet, Take 1 tablet by mouth daily., Disp: , Rfl:    venlafaxine  XR (EFFEXOR -XR) 75 MG 24 hr capsule, TAKE 1 CAPSULE(75 MG) BY MOUTH DAILY WITH BREAKFAST, Disp: 90 capsule, Rfl: 4   VITAMIN D PO, Take 2,000 Units by mouth daily., Disp: , Rfl:    Objective:     Vitals:   04/05/24 1106  Pulse: 69  SpO2: 97%  Weight: 162 lb (73.5 kg)  Height: 5' 3 (1.6 m)      Body mass index is 28.7 kg/m.    Physical Exam:    Neck Exam: Cervical Spine- Posture normal Skin- normal, intact   Neuro:  Strength-   Right Left  Deltoid (C5) 5/5 5/5 Bicep/Brachioradialis (C5/6) 5/5  5/5 Wrist Extension (C6) 5/5 5/5 Tricep (C7) 5/5 5/5 Wrist Flexion (C7) 5/5 5/5 Grip (C8) 5/5 5/5 Finger Abduction (T1) 5/5 5/5   Sensation: intact to light touch in upper extremities bilaterally   Spurling's:  negative bilaterally Neck ROM: Full active ROM TTP:   left cervical paraspinal NTTP: cervical spinous processes, and right cervical paraspinal, thoracic paraspinal, trapezius       Electronically signed by:  Odis Mace D[facility] 11:56 AM 04/05/24

## 2024-04-05 ENCOUNTER — Ambulatory Visit: Admitting: Sports Medicine

## 2024-04-05 VITALS — HR 69 | Ht 63.0 in | Wt 162.0 lb

## 2024-04-05 DIAGNOSIS — M5030 Other cervical disc degeneration, unspecified cervical region: Secondary | ICD-10-CM

## 2024-04-05 DIAGNOSIS — M542 Cervicalgia: Secondary | ICD-10-CM | POA: Diagnosis not present

## 2024-04-05 MED ORDER — MELOXICAM 15 MG PO TABS: 15.0000 mg | ORAL_TABLET | Freq: Every day | ORAL | 0 refills | Status: DC

## 2024-04-05 NOTE — Patient Instructions (Signed)
-   Start meloxicam  15 mg daily x2 weeks.  If still having pain after 2 weeks, complete 3rd-week of NSAID. May use remaining NSAID as needed once daily for pain control.  Do not to use additional over-the-counter NSAIDs (ibuprofen , naproxen, Advil , Aleve, etc.) while taking prescription NSAIDs.  May use Tylenol  [phone] mg 2 to 3 times a day for breakthrough pain.  Refill meloxicam    MRI cervical   Follow up 5 days   PT referral

## 2024-04-08 ENCOUNTER — Ambulatory Visit: Admitting: Sports Medicine

## 2024-04-11 ENCOUNTER — Ambulatory Visit
Admission: RE | Admit: 2024-04-11 | Discharge: 2024-04-11 | Disposition: A | Discharge status: Home / Self Care | Source: Ambulatory Visit | Attending: Sports Medicine | Admitting: Sports Medicine

## 2024-04-11 DIAGNOSIS — M4802 Spinal stenosis, cervical region: Secondary | ICD-10-CM | POA: Diagnosis not present

## 2024-04-11 DIAGNOSIS — M47812 Spondylosis without myelopathy or radiculopathy, cervical region: Secondary | ICD-10-CM | POA: Diagnosis not present

## 2024-04-11 DIAGNOSIS — M50223 Other cervical disc displacement at C6-C7 level: Secondary | ICD-10-CM | POA: Diagnosis not present

## 2024-04-11 DIAGNOSIS — M542 Cervicalgia: Secondary | ICD-10-CM

## 2024-04-15 NOTE — Progress Notes (Deleted)
 [name] [address] [place] [address] Phone: [phone]   Assessment and Plan:     ***    Pertinent previous records reviewed include ***   Follow Up: ***     Subjective:   I, Le Ferraz, am serving as a Neurosurgeon for Doctor Morene Mace   Chief Complaint: upper trap pain    HPI:    03/11/2024 Patient is a 73 year old female with upper trap pain. Patient states that she is having cervical and L shoulder pain for past 6 weeks. Took meloxicam  for 8 days which was helpful. Pain occurs with cervical rotation. Still able to workout as pain is always after activity. Had 2 migraines in past month. Hx L shoulder arthroscopy.    04/05/2024 Patient states neck pain is a little better but not much   04/18/2024 Patient states   Relevant Historical Information: History of breast cancer    Additional pertinent review of systems negative.   Current Outpatient Medications:    albuterol  (PROAIR  HFA) 108 (90 Base) MCG/ACT inhaler, Inhale 2 puffs into the lungs every 4 (four) hours as needed for wheezing or shortness of breath., Disp: 1 Inhaler, Rfl: 1   ALPRAZolam  (XANAX ) 0.25 MG tablet, Take 0.25 mg by mouth daily as needed (travel)., Disp: , Rfl:    amLODipine (NORVASC) 2.5 MG tablet, Take 2.5 mg by mouth daily., Disp: , Rfl:    atorvastatin (LIPITOR) 10 MG tablet, Take 10 mg by mouth at bedtime., Disp: , Rfl:    azelastine  (ASTELIN ) 0.1 % nasal spray, Place 1 spray into both nostrils daily., Disp: , Rfl:    cetirizine (ZYRTEC) 10 MG tablet, Take 10 mg by mouth at bedtime., Disp: , Rfl:    FLOVENT  HFA 110 MCG/ACT inhaler, INHALE 1 PUFF BY MOUTH INTO THE LUNGS TWICE DAILY (Patient taking differently: Inhale 2 puffs into the lungs 2 (two) times daily as needed (shortness of breath).), Disp: 12 g, Rfl: 0   fluticasone  (FLONASE ) 50 MCG/ACT nasal spray, Place 1 spray into both nostrils daily., Disp: , Rfl:     hydrocortisone  (ANUSOL -HC) 2.5 % rectal cream, Place 1 application rectally as needed for hemorrhoids or itching., Disp: , Rfl:    ibuprofen  (ADVIL ) 200 MG tablet, Take 200 mg by mouth every 6 (six) hours as needed for moderate pain., Disp: , Rfl:    meloxicam  (MOBIC ) 15 MG tablet, Take 1 tablet (15 mg total) by mouth daily., Disp: 30 tablet, Rfl: 0   meloxicam  (MOBIC ) 15 MG tablet, Take 1 tablet (15 mg total) by mouth daily., Disp: 30 tablet, Rfl: 0   methylPREDNISolone  (MEDROL  DOSEPAK) 4 MG TBPK tablet, Take 6 tablets on day 1.  Take 5 tablets on day 2.  Take 4 tablets on day 3.  Take 3 tablets on day 4.  Take 2 tablets on day 5.  Take 1 tablet on day 6., Disp: 21 tablet, Rfl: 0   omeprazole (PRILOSEC) 20 MG capsule, Take 20 mg by mouth daily., Disp: , Rfl:    OVER THE COUNTER MEDICATION, Take 2 capsules by mouth daily. MEGASPOREBIOTIC, Disp: , Rfl:    psyllium (METAMUCIL) 58.6 % packet, Take 1 packet by mouth daily., Disp: , Rfl:    RESTASIS 0.05 % ophthalmic emulsion, Place 1 drop into both eyes 2 (two) times daily., Disp: , Rfl:    tamoxifen  (NOLVADEX ) 20 MG tablet, TAKE 1 TABLET(20 MG) BY MOUTH DAILY, Disp: 90 tablet, Rfl: 3  triamcinolone  (KENALOG) 0.1 %, Apply 1 Application topically daily as needed (psoriasis)., Disp: , Rfl:    valACYclovir  (VALTREX ) 1000 MG tablet, Take 1 tablet (1,000 mg total) by mouth 2 (two) times daily. Take for 1 day as directed above as needed. (Patient taking differently: Take 1,000 mg by mouth 2 (two) times daily as needed (fever blisters).), Disp: 30 tablet, Rfl: 1   valsartan-hydrochlorothiazide (DIOVAN-HCT) 160-12.5 MG tablet, Take 1 tablet by mouth daily., Disp: , Rfl:    venlafaxine  XR (EFFEXOR -XR) 75 MG 24 hr capsule, TAKE 1 CAPSULE(75 MG) BY MOUTH DAILY WITH BREAKFAST, Disp: 90 capsule, Rfl: 4   VITAMIN D PO, Take 2,000 Units by mouth daily., Disp: , Rfl:    Objective:     There were no vitals filed for this visit.    There is no height or weight on  file to calculate BMI.    Physical Exam:    ***   Electronically signed by:  [name] 7:27 AM 04/15/24

## 2024-04-18 ENCOUNTER — Ambulatory Visit: Admitting: Sports Medicine

## 2024-04-18 ENCOUNTER — Ambulatory Visit: Payer: Self-pay | Admitting: Sports Medicine

## 2024-05-11 NOTE — Progress Notes (Unsigned)
 [name] [address] [place] [address] Phone: [phone]   Assessment and Plan:     1. Cervicalgia (Primary) 2. Cervical spine pain 3. DDD (degenerative disc disease), cervical 4. Strain of left trapezius muscle, subsequent encounter -Chronic with exacerbation, subsequent visit - Continued neck pain, tightness radiating from mid neck into the left trapezius.  Consistent with degenerative changes seen on C-spine MRI - Reviewed C-spine MRI which showed facet arthritis at C4-5 with marrow edema, moderate left C5 and C6 neural foraminal stenosis - No significant change in symptoms despite prednisone and meloxicam  courses.  Use meloxicam  15 mg daily as needed for breakthrough pain.  Recommend limiting chronic NSAIDs to 1-2 doses per week to prevent long-term side effects. Use Tylenol  500 to 1000 mg tablets 2-3 times a day as needed for day-to-day pain relief.    -Continue HEP and start physical therapy.  New referral sent.  Patient is interested in dry needling. - Offered epidural CSI which patient declined at today's visit.  Could consider at future visit  5. Pain of right hip 6. Greater trochanteric bursitis of right hip -Chronic with exacerbation, subsequent visit - Recurrence of right lateral hip pain most consistent with greater trochanteric bursitis - Patient has had significant relief in the past with greater trochanteric CSI.  Elected for repeat CSI today.  Tolerated well per note below - Restart HEP for gluteal musculature  Procedure: Greater trochanteric bursal injection Side: Right  Risks explained and consent was given verbally. The site was cleaned with alcohol prep. A steroid injection was performed with patient in the lateral side-lying position at area of maximum tenderness over greater trochanter using 2mL of 1% lidocaine  without epinephrine  and 1mL of kenalog 40mg /ml. This was well tolerated.  Needle was  removed, hemostasis achieved, and post injection instructions were explained.  Pt was advised to call or return to clinic if these symptoms worsen or fail to improve as anticipated.      Pertinent previous records reviewed include C-spine MRI 04/11/2024   Follow Up: 6 weeks for reevaluation.  Could consider epidural CSI versus trigger point injections   Subjective:   I, Shanterica Biehler, am serving as a Neurosurgeon for Doctor Morene Mace   Chief Complaint: upper trap pain    HPI:    03/11/2024 Patient is a 73 year old female with upper trap pain. Patient states that she is having cervical and L shoulder pain for past 6 weeks. Took meloxicam  for 8 days which was helpful. Pain occurs with cervical rotation. Still able to workout as pain is always after activity. Had 2 migraines in past month. Hx L shoulder arthroscopy.    04/05/2024 Patient states neck pain is a little better but not much   05/12/2024 Patient states upper trap is tight. Right hip pain, decreased ROM. Doesn't know if meloxicam  has helped.    Relevant Historical Information: History of breast cancer    Additional pertinent review of systems negative.   Current Outpatient Medications:    albuterol  (PROAIR  HFA) 108 (90 Base) MCG/ACT inhaler, Inhale 2 puffs into the lungs every 4 (four) hours as needed for wheezing or shortness of breath., Disp: 1 Inhaler, Rfl: 1   ALPRAZolam  (XANAX ) 0.25 MG tablet, Take 0.25 mg by mouth daily as needed (travel)., Disp: , Rfl:    amLODipine (NORVASC) 2.5 MG tablet, Take 2.5 mg by mouth daily., Disp: , Rfl:    atorvastatin (LIPITOR) 10 MG tablet, Take 10  mg by mouth at bedtime., Disp: , Rfl:    azelastine  (ASTELIN ) 0.1 % nasal spray, Place 1 spray into both nostrils daily., Disp: , Rfl:    cetirizine (ZYRTEC) 10 MG tablet, Take 10 mg by mouth at bedtime., Disp: , Rfl:    FLOVENT  HFA 110 MCG/ACT inhaler, INHALE 1 PUFF BY MOUTH INTO THE LUNGS TWICE DAILY (Patient taking differently: Inhale 2  puffs into the lungs 2 (two) times daily as needed (shortness of breath).), Disp: 12 g, Rfl: 0   fluticasone  (FLONASE ) 50 MCG/ACT nasal spray, Place 1 spray into both nostrils daily., Disp: , Rfl:    hydrocortisone  (ANUSOL -HC) 2.5 % rectal cream, Place 1 application rectally as needed for hemorrhoids or itching., Disp: , Rfl:    ibuprofen  (ADVIL ) 200 MG tablet, Take 200 mg by mouth every 6 (six) hours as needed for moderate pain., Disp: , Rfl:    meloxicam  (MOBIC ) 15 MG tablet, Take 1 tablet (15 mg total) by mouth daily., Disp: 30 tablet, Rfl: 0   meloxicam  (MOBIC ) 15 MG tablet, Take 1 tablet (15 mg total) by mouth daily., Disp: 30 tablet, Rfl: 0   methylPREDNISolone  (MEDROL  DOSEPAK) 4 MG TBPK tablet, Take 6 tablets on day 1.  Take 5 tablets on day 2.  Take 4 tablets on day 3.  Take 3 tablets on day 4.  Take 2 tablets on day 5.  Take 1 tablet on day 6., Disp: 21 tablet, Rfl: 0   omeprazole (PRILOSEC) 20 MG capsule, Take 20 mg by mouth daily., Disp: , Rfl:    OVER THE COUNTER MEDICATION, Take 2 capsules by mouth daily. MEGASPOREBIOTIC, Disp: , Rfl:    psyllium (METAMUCIL) 58.6 % packet, Take 1 packet by mouth daily., Disp: , Rfl:    RESTASIS 0.05 % ophthalmic emulsion, Place 1 drop into both eyes 2 (two) times daily., Disp: , Rfl:    tamoxifen  (NOLVADEX ) 20 MG tablet, TAKE 1 TABLET(20 MG) BY MOUTH DAILY, Disp: 90 tablet, Rfl: 3   triamcinolone  (KENALOG) 0.1 %, Apply 1 Application topically daily as needed (psoriasis)., Disp: , Rfl:    valACYclovir  (VALTREX ) 1000 MG tablet, Take 1 tablet (1,000 mg total) by mouth 2 (two) times daily. Take for 1 day as directed above as needed. (Patient taking differently: Take 1,000 mg by mouth 2 (two) times daily as needed (fever blisters).), Disp: 30 tablet, Rfl: 1   valsartan-hydrochlorothiazide (DIOVAN-HCT) 160-12.5 MG tablet, Take 1 tablet by mouth daily., Disp: , Rfl:    venlafaxine  XR (EFFEXOR -XR) 75 MG 24 hr capsule, TAKE 1 CAPSULE(75 MG) BY MOUTH DAILY WITH  BREAKFAST, Disp: 90 capsule, Rfl: 4   VITAMIN D PO, Take 2,000 Units by mouth daily., Disp: , Rfl:    Objective:     Vitals:   05/12/24 0755  BP: 124/86  Pulse: 78  SpO2: 96%  Weight: 160 lb (72.6 kg)  Height: 5' 3 (1.6 m)      Body mass index is 28.34 kg/m.    Physical Exam:    Neck Exam: Cervical Spine- Posture normal Skin- normal, intact   Neuro:  Strength-   Right Left  Deltoid (C5) 5/5 5/5 Bicep/Brachioradialis (C5/6) 5/5  5/5 Wrist Extension (C6) 5/5 5/5 Tricep (C7) 5/5 5/5 Wrist Flexion (C7) 5/5 5/5 Grip (C8) 5/5 5/5 Finger Abduction (T1) 5/5 5/5   Sensation: intact to light touch in upper extremities bilaterally   Spurling's:  negative bilaterally Neck ROM: Full active ROM TTP:   left cervical paraspinal NTTP: cervical spinous processes,  and right cervical paraspinal, thoracic paraspinal, trapezius   Right hip: Full range of motion TTP greater trochanter, gluteal musculature Piriformis Test reproduced tension and pain over the greater trochanter, but no radicular symptoms  Electronically signed by:  [name] 8:12 AM 05/12/24

## 2024-05-12 ENCOUNTER — Ambulatory Visit: Admitting: Sports Medicine

## 2024-05-12 VITALS — BP 124/86 | HR 78 | Ht 63.0 in | Wt 160.0 lb

## 2024-05-12 DIAGNOSIS — M25551 Pain in right hip: Secondary | ICD-10-CM | POA: Diagnosis not present

## 2024-05-12 DIAGNOSIS — M7061 Trochanteric bursitis, right hip: Secondary | ICD-10-CM

## 2024-05-12 DIAGNOSIS — M542 Cervicalgia: Secondary | ICD-10-CM

## 2024-05-12 DIAGNOSIS — M5030 Other cervical disc degeneration, unspecified cervical region: Secondary | ICD-10-CM

## 2024-05-12 DIAGNOSIS — S46812D Strain of other muscles, fascia and tendons at shoulder and upper arm level, left arm, subsequent encounter: Secondary | ICD-10-CM | POA: Diagnosis not present

## 2024-05-12 NOTE — Patient Instructions (Signed)
-   Use meloxicam  15 mg daily as needed for breakthrough pain.  Recommend limiting chronic NSAIDs to 1-2 doses per week to prevent long-term side effects. Use Tylenol  500 to 1000 mg tablets 2-3 times a day as needed for day-to-day pain relief.    Neck HEP   PT referral   6 week follow up

## 2024-05-24 ENCOUNTER — Encounter: Payer: Self-pay | Admitting: Physical Therapy

## 2024-05-24 ENCOUNTER — Other Ambulatory Visit: Payer: Self-pay

## 2024-05-24 ENCOUNTER — Ambulatory Visit: Admitting: Physical Therapy

## 2024-05-24 DIAGNOSIS — M25551 Pain in right hip: Secondary | ICD-10-CM

## 2024-05-24 DIAGNOSIS — M6281 Muscle weakness (generalized): Secondary | ICD-10-CM

## 2024-05-24 DIAGNOSIS — M542 Cervicalgia: Secondary | ICD-10-CM | POA: Diagnosis not present

## 2024-05-24 NOTE — Patient Instructions (Signed)
 Access Code: [email or web address] URL: [email or web address] Date: 05/24/2024 Prepared by: Elaine Daring  Exercises - Supine Cervical Retraction with Towel  - 1 x daily - 2 sets - 10 reps - 5 seconds hold - Sidelying Thoracic Lumbar Rotation  - 1 x daily - 2 sets - 10 reps - Seated Assisted Cervical Rotation with Towel  - 1 x daily - 2 sets - 10 reps

## 2024-05-24 NOTE — Therapy (Signed)
 OUTPATIENT PHYSICAL THERAPY EVALUATION   Patient Name: Sabrina Tapia MRN: [id number] DOB:August 31, 1950, 73 y.o., female Today's Date: 05/24/2024   END OF SESSION:  PT End of Session - 05/24/24 1458     Visit Number 1    Number of Visits 9    Date for Recertification  07/19/24    Authorization Type [employer] MCR    Progress Note Due on Visit 10    PT Start Time 1345    PT Stop Time 1445    PT Time Calculation (min) 60 min    Activity Tolerance Patient tolerated treatment well    Behavior During Therapy WFL for tasks assessed/performed          Past Medical History:  Diagnosis Date   Allergy-induced asthma    prn inhaler   Arterial calcification    sees pcp for takes atorvastatin  for   CAD (coronary artery disease)    Constipation    Emphysema of lung (HCC)    Endometrial thickening on ultrasound    Environmental and seasonal allergies    Family history of adverse reaction to anesthesia    pt's father has hx. of being hard to wake up post-op x 1 time after 8 hour whipple surgery   GERD (gastroesophageal reflux disease)    Hemorrhoids    Hisory of Migraines    History of basal cell carcinoma (BCC) excision    right clavicle   History of breast cancer 2017   bilateral   History of radiation therapy    08/19/16- 09/29/16, Right Breast 50 Gy in 25 fractions, Right Breast boost 10 Gy in 5 fractions, Left Breast 50.4 Gy in 28 fractions   Hypertension    OSA (obstructive sleep apnea)    uses dental appliance mild /moderate osa   Osteoarthritis    hands and feet   Osteopenia 02/2016   T score -1.3 FRAX 8.3%/0.7% stable from prior DEXA   Overactive bladder    Stroke (HCC)    Thyroid  nodule 2012   Solitary nodule in the lower pole of the left thyroid  lobe , pt unaware   Wears glasses for reading    Wears hearing aid in both ears    Past Surgical History:  Procedure Laterality Date   BREAST RECONSTRUCTION WITH PLACEMENT OF TISSUE EXPANDER AND FLEX HD (ACELLULAR HYDRATED  DERMIS) Left 07/01/2016   Procedure: BREAST RECONSTRUCTION WITH PLACEMENT OF TISSUE EXPANDER AND  ALLODERM;  Surgeon: Earlis Ranks, MD;  Location: [facility];  Service: Plastics;  Laterality: Left;   BROW LIFT     COLONOSCOPY  2022   DILATATION & CURETTAGE/HYSTEROSCOPY WITH MYOSURE N/A 12/06/2018   Procedure: DILATATION & CURETTAGE/HYSTEROSCOPY WITH MYOSURE;  Surgeon: Cathlyn JAYSON Nikki Bobie FORBES, MD;  Location: [facility];  Service: Gynecology;  Laterality: N/A;  follow 1st case   DILATATION & CURETTAGE/HYSTEROSCOPY WITH MYOSURE N/A 04/21/2022   Procedure: DILATATION & CURETTAGE/HYSTEROSCOPY;  Surgeon: Cathlyn JAYSON Nikki Bobie FORBES, MD;  Location: [facility];  Service: Gynecology;  Laterality: N/A;   INCISION AND DRAINAGE Left 06/02/2016   Procedure: DRAINAGE of left axilla seroma;  Surgeon: Donnice Bury, MD;  Location: [facility];  Service: General;  Laterality: Left;   LIPOSUCTION WITH LIPOFILLING Left 06/11/2018   Procedure: Lipofilling from abdomen to left chest;  Surgeon: Ranks Earlis, MD;  Location: [facility];  Service: Plastics;  Laterality: Left;   MASTOPEXY Right 04/17/2017   Procedure: RIGHT MASTOPEXY;  Surgeon: Thimmappa,  Earlis, MD;  Location: [facility];  Service: Plastics;  Laterality: Right;   NIPPLE SPARING MASTECTOMY Left 07/01/2016   Procedure: LEFT NIPPLE SPARING MASTECTOMY;  Surgeon: Donnice Bury, MD;  Location: [facility];  Service: General;  Laterality: Left;   OPERATIVE ULTRASOUND N/A 04/21/2022   Procedure: OPERATIVE ULTRASOUND;  Surgeon: Cathlyn JAYSON Nikki Bobie FORBES, MD;  Location: [facility];  Service: Gynecology;  Laterality: N/A;   RADIOACTIVE SEED GUIDED PARTIAL MASTECTOMY WITH AXILLARY SENTINEL LYMPH NODE BIOPSY Right 03/26/2016   Procedure: RIGHT BREAST LUMPECTOMY WITH RADIOACTIVE SEED AND SENTINEL LYMPH NODE BIOPSY;  Surgeon: Donnice Bury, MD;  Location: [facility];  Service: General;  Laterality: Right;   RADIOACTIVE SEED GUIDED PARTIAL MASTECTOMY WITH AXILLARY SENTINEL LYMPH NODE BIOPSY Left 05/20/2016   Procedure: LEFT BREAST RADIOACTIVE SEED (two seeds) GUIDED LUMPECTOMY WITH LEFT AXILLARY SENTINEL LYMPH NODE BIOPSY AND LEFT SEED TARGETED AXILLARY LYMPH NODE EXCISION;  Surgeon: Donnice Bury, MD;  Location: [facility];  Service: General;  Laterality: Left;   RE-EXCISION OF BREAST CANCER,SUPERIOR MARGINS Right 05/20/2016   Procedure: RE-EXCISION OF RIGHT BREAST MARGIN;  Surgeon: Donnice Bury, MD;  Location: [facility];  Service: General;  Laterality: Right;   RE-EXCISION OF BREAST CANCER,SUPERIOR MARGINS Left 06/02/2016   Procedure: RE-EXCISION OF BREAST CANCER,SUPERIOR MARGINS;  Surgeon: Donnice Bury, MD;  Location: [facility];  Service: General;  Laterality: Left;   REMOVAL OF TISSUE EXPANDER AND PLACEMENT OF IMPLANT Left 04/17/2017   Procedure: REMOVAL OF LEFT TISSUE EXPANDER WITH PLACEMENT OF LEFT SILICONE BREAST IMPLANT;  Surgeon: Arelia Earlis, MD;  Location: [facility];  Service: Plastics;  Laterality: Left;   ROBOTIC ASSISTED LAPAROSCOPIC LYSIS OF ADHESION N/A 06/26/2022   Procedure: XI ROBOTIC ASSISTED LAPAROSCOPIC LYSIS OF ADHESION;  Surgeon: Viktoria Comer SAUNDERS, MD;  Location: [facility];  Service: Gynecology;  Laterality: N/A;   ROBOTIC ASSISTED TOTAL HYSTERECTOMY WITH BILATERAL SALPINGO OOPHERECTOMY N/A 06/26/2022   Procedure: XI ROBOTIC ASSISTED TOTAL HYSTERECTOMY WITH BILATERAL SALPINGO OOPHORECTOMY, MINI LAPAROTOMY;  Surgeon: Viktoria Comer SAUNDERS, MD;  Location: [facility];  Service: Gynecology;  Laterality: N/A;   shoulder surgery for arthritis Left 07/2021   arthroscopic done at [facility] [place]   Patient Active Problem List   Diagnosis Date Noted   Pelvic adhesions 06/26/2022   Degenerative tear of medial  meniscus 01/23/2022   Greater trochanteric bursitis of right hip 06/11/2021   Arthritis of carpometacarpal [facility]) joint of left thumb 06/11/2021   Mass of right lung 07/13/2020   Arthritis 08/03/2019   Rosacea 08/03/2019   Pes anserinus bursitis of right knee 03/28/2019   Pain in right knee 03/22/2019   Thickened endometrium 10/24/2018   Adhesive capsulitis of right shoulder 10/18/2018   Pain in right hand 10/05/2018   Tear of biceps tendon 09/21/2018   Moderate persistent asthma, uncomplicated 06/29/2017   Hallux rigidus of right foot 06/15/2017   Left Achilles tendinitis 06/15/2017   History of bilateral breast cancer 04/23/2017   Mild persistent asthma, uncomplicated 02/12/2017   Other seasonal allergic rhinitis 02/12/2017   History of therapeutic radiation 02/11/2017   Malignant neoplasm of overlapping sites of left breast in female, estrogen receptor positive (HCC) 10/27/2016   Sinusitis 09/01/2016   Hyponatremia 09/01/2016   Acquired absence of left breast 07/16/2016   Malignant neoplasm of upper-outer quadrant of right breast in female, estrogen receptor positive (HCC) 04/19/2016   Dizziness after extension of neck 02/19/2016  Sensorineural hearing loss (SNHL), bilateral 02/19/2016   Tinnitus of both ears 02/19/2016   Bunion of great toe of right foot 04/04/2015   Hemorrhoid 11/23/2014   Anal skin tag 11/23/2014   Incontinence of urine 07/28/2014   Retrocalcaneal bursitis 05/11/2014   Pain in joint, ankle and foot 05/11/2014   Heart burn    Joint pain    Abnormal Pap smear    Osteopenia     PCP: Charlott Dorn LABOR, MD  REFERRING PROVIDER: Leonce Katz, DO  REFERRING DIAG: Cervicalgia; Pain of right hip  THERAPY DIAG:  Cervicalgia  Pain in right hip  Muscle weakness (generalized)  Rationale for Evaluation and Treatment: Rehabilitation  ONSET DATE: Chronic, approximately 3 months   SUBJECTIVE SUBJECTIVE STATEMENT: Patient reports neck pain and  tightening of her shoulders for close to 3 months with gradual worsening of pain. Occasionally she turns it and feels/hears this scraping and popping. She states she currently feels discomfort in the left side of the neck and the longer she sits the more difficulty it gets, also turning her neck to the left. She denies any pain into the shoulder or arm. She plays a lot of golf and is uncomfortable after she plays. She did have a left arthroscopic shoulder surgery 3-4 years ago and mastectomy due to breast cancer that may have contributed to worsening posture. She also reports some right hip pain on the outside of the hip, and she did get a shot at her last appointment which seemed to help. She does see a trainer 2x/week.   PERTINENT HISTORY:  See PMH above  PAIN:  Are you having pain? Yes:  NPRS scale: 3-4/10 currently, 7-8/10 at worst Pain location: Left side of neck Pain description: Sharp, tight Aggravating factors: Sitting, turning neck to left, golf Relieving factors: Medication  PRECAUTIONS: None  RED FLAGS: None    WEIGHT BEARING RESTRICTIONS: No  FALLS:  Has patient fallen in last 6 months? No  PLOF: Independent  PATIENT GOALS: Get rid of pain, learn how to manage pain and exercises at home, golf without limitation   OBJECTIVE:  Note: Objective measures were completed at Evaluation unless otherwise noted. PATIENT SURVEYS:  PSFS: 5.33 Sitting extended periods: 6 Turning neck to left: 4 Golf: 6  COGNITION: Overall cognitive status: Within functional limits for tasks assessed  SENSATION: WFL  POSTURE:    Mild rounded shoulder and forward head posture  PALPATION: Tender to palpation left upper trap region   Hypomobility noted of the cervical and thoracic spine  CERVICAL ROM:   Active ROM A/PROM (deg) eval  Flexion 35  Extension 45  Right lateral flexion 25  Left lateral flexion 30  Right rotation 70  Left rotation 55   (Blank rows = not tested)  UPPER  EXTREMITY ROM:  Active ROM Right eval Left eval  Shoulder flexion 150 150  Shoulder extension    Shoulder abduction    Shoulder adduction    Shoulder extension    Shoulder internal rotation    Shoulder external rotation    Elbow flexion    Elbow extension    Wrist flexion    Wrist extension    Wrist ulnar deviation    Wrist radial deviation    Wrist pronation    Wrist supination     (Blank rows = not tested)  UPPER EXTREMITY MMT:  MMT Right eval Left eval  Shoulder flexion 5 5  Shoulder extension 5 4+  Shoulder abduction 5 5  Shoulder adduction  Shoulder extension    Shoulder internal rotation 5 5  Shoulder external rotation 5 4+  Middle trapezius 4 4-  Lower trapezius    Elbow flexion    Elbow extension    Wrist flexion    Wrist extension    Wrist ulnar deviation    Wrist radial deviation    Wrist pronation    Wrist supination    Grip strength     (Blank rows = not tested)  CERVICAL SPECIAL TESTS:  Cervical radicular testing negative  LOWER EXTREMITY MMT:    MMT Right eval Left eval  Hip flexion    Hip extension    Hip abduction    Hip adduction    Hip internal rotation    Hip external rotation    Knee flexion    Knee extension    Ankle dorsiflexion    Ankle plantarflexion    Ankle inversion    Ankle eversion     (Blank rows = not tested)   FUNCTIONAL TESTS:  DNF endurance: 7 seconds   TREATMENT [facility] Adult PT Treatment:                                                DATE: 05/24/2024 Supine cervical retraction with small towel roll under neck 10 x 5 sec Sidelying thoracic rotation x 10 each Seated cervical rotational SNAG x 10 each  Time spent discussing postural deviations and corrections, contributing of cervical and thoracic mobility in the golf swing, use of TPDN for future appointments to address muscular tightness, current exercise routine with trainer  PATIENT EDUCATION:  Education details: Exam findings, POC, HEP Person  educated: Patient Education method: Explanation, Demonstration, Tactile cues, Verbal cues, and Handouts Education comprehension: verbalized understanding, returned demonstration, verbal cues required, tactile cues required, and needs further education  HOME EXERCISE PROGRAM: Access Code: [email or web address]   ASSESSMENT: CLINICAL IMPRESSION: Patient is a 73 y.o. female who was seen today for physical therapy evaluation and treatment for chronic neck and right hip pain. Primarily focused on assessing neck this visit. She does exhibit some spinal mobility deficits and muscular tightness that is impacting her range of motion. She also demonstrates postural deviations and DNF endurance impairment. She does have some residual strength deficit of the left shoulder that may be contributing to her symptoms.    OBJECTIVE IMPAIRMENTS: decreased activity tolerance, decreased ROM, decreased strength, impaired flexibility, postural dysfunction, and pain.   ACTIVITY LIMITATIONS: lifting and sitting  PARTICIPATION LIMITATIONS: community activity  PERSONAL FACTORS: Past/current experiences and Time since onset of injury/illness/exacerbation are also affecting patient's functional outcome.   REHAB POTENTIAL: Good  CLINICAL DECISION MAKING: Stable/uncomplicated  EVALUATION COMPLEXITY: Low   GOALS: Goals reviewed with patient? Yes  SHORT TERM GOALS: Target date: 06/21/2024  Patient will be I with initial HEP in order to progress with therapy. Baseline: HEP provided at eval Goal status: INITIAL  2.  Patient will demonstrate left cervical rotation >/= 70 deg in order to reduce neck pain and limitation with golf Baseline: 55 deg Goal status: INITIAL  LONG TERM GOALS: Target date: 07/19/2024  Patient will be I with final HEP to maintain progress from PT. Baseline: HEP provided at eval Goal status: INITIAL  2.  Patient will report PSFS >/= 8 in order to indicate improvement in their functional  ability. Baseline: 5.33 Goal status: INITIAL  3.  Patient will demonstrate DNF endurance >/= 30 seconds in order to improve postural control and reduce neck pain with sitting Baseline:  Goal status: INITIAL  4.  Patient will report neck pain </= 2/10 with all activity and cervical motion in order to reduce functional limitations Baseline: 7-8/10 pain at worse Goal status: INITIAL   PLAN: PT FREQUENCY: 1x/week  PT DURATION: 8 weeks  PLANNED INTERVENTIONS: 97164- PT Re-evaluation, 97750- Physical Performance Testing, 97110-Therapeutic exercises, 97530- Therapeutic activity, 97112- Neuromuscular re-education, 97535- Self Care, 02859- Manual therapy, 20560 (1-2 muscles), 20561 (3+ muscles)- Dry Needling, Patient/Family education, Taping, Joint mobilization, Joint manipulation, Spinal manipulation, Spinal mobilization, Cryotherapy, and Moist heat  PLAN FOR NEXT SESSION: Review HEP and progress PRN, manual/TPDN for cervical and left upper trap region, progress cervical and thoracic mobility, postural strengthening and DNF endurance; further assessment and treatment of right hip as indicated   Elaine Daring, PT, DPT, LAT, ATC 05/24/24  3:09 PM Phone: [phone] Fax: [phone]

## 2024-05-26 DIAGNOSIS — D485 Neoplasm of uncertain behavior of skin: Secondary | ICD-10-CM | POA: Diagnosis not present

## 2024-05-26 DIAGNOSIS — W57XXXA Bitten or stung by nonvenomous insect and other nonvenomous arthropods, initial encounter: Secondary | ICD-10-CM | POA: Diagnosis not present

## 2024-05-26 DIAGNOSIS — L821 Other seborrheic keratosis: Secondary | ICD-10-CM | POA: Diagnosis not present

## 2024-05-26 DIAGNOSIS — L309 Dermatitis, unspecified: Secondary | ICD-10-CM | POA: Diagnosis not present

## 2024-05-30 ENCOUNTER — Ambulatory Visit: Admitting: Physical Therapy

## 2024-05-30 ENCOUNTER — Other Ambulatory Visit: Payer: Self-pay

## 2024-05-30 ENCOUNTER — Encounter: Payer: Self-pay | Admitting: Physical Therapy

## 2024-05-30 DIAGNOSIS — M6281 Muscle weakness (generalized): Secondary | ICD-10-CM

## 2024-05-30 DIAGNOSIS — M542 Cervicalgia: Secondary | ICD-10-CM

## 2024-05-30 DIAGNOSIS — M25551 Pain in right hip: Secondary | ICD-10-CM

## 2024-05-30 NOTE — Therapy (Signed)
 OUTPATIENT PHYSICAL THERAPY TREATMENT   Patient Name: Sabrina Tapia MRN: [id number] DOB:May 04, 1951, 73 y.o., female Today's Date: 05/30/2024   END OF SESSION:  PT End of Session - 05/30/24 1436     Visit Number 2    Number of Visits 9    Date for Recertification  07/19/24    Authorization Type [employer] MCR    Authorization Time Period 05/30/2024 - 08/29/2023    Authorization - Visit Number 1    Authorization - Number of Visits 8    Progress Note Due on Visit 10    PT Start Time 1432    PT Stop Time 1515    PT Time Calculation (min) 43 min    Activity Tolerance Patient tolerated treatment well    Behavior During Therapy WFL for tasks assessed/performed           Past Medical History:  Diagnosis Date   Allergy-induced asthma    prn inhaler   Arterial calcification    sees pcp for takes atorvastatin  for   CAD (coronary artery disease)    Constipation    Emphysema of lung (HCC)    Endometrial thickening on ultrasound    Environmental and seasonal allergies    Family history of adverse reaction to anesthesia    pt's father has hx. of being hard to wake up post-op x 1 time after 8 hour whipple surgery   GERD (gastroesophageal reflux disease)    Hemorrhoids    Hisory of Migraines    History of basal cell carcinoma (BCC) excision    right clavicle   History of breast cancer 2017   bilateral   History of radiation therapy    08/19/16- 09/29/16, Right Breast 50 Gy in 25 fractions, Right Breast boost 10 Gy in 5 fractions, Left Breast 50.4 Gy in 28 fractions   Hypertension    OSA (obstructive sleep apnea)    uses dental appliance mild /moderate osa   Osteoarthritis    hands and feet   Osteopenia 02/2016   T score -1.3 FRAX 8.3%/0.7% stable from prior DEXA   Overactive bladder    Stroke (HCC)    Thyroid  nodule 2012   Solitary nodule in the lower pole of the left thyroid  lobe , pt unaware   Wears glasses for reading    Wears hearing aid in both ears    Past Surgical  History:  Procedure Laterality Date   BREAST RECONSTRUCTION WITH PLACEMENT OF TISSUE EXPANDER AND FLEX HD (ACELLULAR HYDRATED DERMIS) Left 07/01/2016   Procedure: BREAST RECONSTRUCTION WITH PLACEMENT OF TISSUE EXPANDER AND  ALLODERM;  Surgeon: Earlis Ranks, MD;  Location: [facility];  Service: Plastics;  Laterality: Left;   BROW LIFT     COLONOSCOPY  2022   DILATATION & CURETTAGE/HYSTEROSCOPY WITH MYOSURE N/A 12/06/2018   Procedure: DILATATION & CURETTAGE/HYSTEROSCOPY WITH MYOSURE;  Surgeon: Cathlyn JAYSON Nikki Bobie FORBES, MD;  Location: [facility];  Service: Gynecology;  Laterality: N/A;  follow 1st case   DILATATION & CURETTAGE/HYSTEROSCOPY WITH MYOSURE N/A 04/21/2022   Procedure: DILATATION & CURETTAGE/HYSTEROSCOPY;  Surgeon: Cathlyn JAYSON Nikki Bobie FORBES, MD;  Location: [facility];  Service: Gynecology;  Laterality: N/A;   INCISION AND DRAINAGE Left 06/02/2016   Procedure: DRAINAGE of left axilla seroma;  Surgeon: Donnice Bury, MD;  Location:  SUR[facility]ERY C[facility];  Service: General;  Laterality: Left;   LIPOSUCTION WITH LIPOFILLING Left 06/11/2018   Procedure: Lipofilling from abdomen to left chest;  Surgeon: Thimmappa,  Earlis, MD;  Location: [facility];  Service: Plastics;  Laterality: Left;   MASTOPEXY Right 04/17/2017   Procedure: RIGHT MASTOPEXY;  Surgeon: Arelia Earlis, MD;  Location: [facility];  Service: Plastics;  Laterality: Right;   NIPPLE SPARING MASTECTOMY Left 07/01/2016   Procedure: LEFT NIPPLE SPARING MASTECTOMY;  Surgeon: Donnice Bury, MD;  Location: [facility];  Service: General;  Laterality: Left;   OPERATIVE ULTRASOUND N/A 04/21/2022   Procedure: OPERATIVE ULTRASOUND;  Surgeon: Cathlyn JAYSON Nikki Bobie FORBES, MD;  Location: [facility];  Service: Gynecology;  Laterality: N/A;   RADIOACTIVE SEED GUIDED PARTIAL MASTECTOMY WITH AXILLARY SENTINEL LYMPH NODE BIOPSY  Right 03/26/2016   Procedure: RIGHT BREAST LUMPECTOMY WITH RADIOACTIVE SEED AND SENTINEL LYMPH NODE BIOPSY;  Surgeon: Donnice Bury, MD;  Location: [facility];  Service: General;  Laterality: Right;   RADIOACTIVE SEED GUIDED PARTIAL MASTECTOMY WITH AXILLARY SENTINEL LYMPH NODE BIOPSY Left 05/20/2016   Procedure: LEFT BREAST RADIOACTIVE SEED (two seeds) GUIDED LUMPECTOMY WITH LEFT AXILLARY SENTINEL LYMPH NODE BIOPSY AND LEFT SEED TARGETED AXILLARY LYMPH NODE EXCISION;  Surgeon: Donnice Bury, MD;  Location: [facility];  Service: General;  Laterality: Left;   RE-EXCISION OF BREAST CANCER,SUPERIOR MARGINS Right 05/20/2016   Procedure: RE-EXCISION OF RIGHT BREAST MARGIN;  Surgeon: Donnice Bury, MD;  Location: [facility];  Service: General;  Laterality: Right;   RE-EXCISION OF BREAST CANCER,SUPERIOR MARGINS Left 06/02/2016   Procedure: RE-EXCISION OF BREAST CANCER,SUPERIOR MARGINS;  Surgeon: Donnice Bury, MD;  Location: [facility];  Service: General;  Laterality: Left;   REMOVAL OF TISSUE EXPANDER AND PLACEMENT OF IMPLANT Left 04/17/2017   Procedure: REMOVAL OF LEFT TISSUE EXPANDER WITH PLACEMENT OF LEFT SILICONE BREAST IMPLANT;  Surgeon: Arelia Earlis, MD;  Location: [facility];  Service: Plastics;  Laterality: Left;   ROBOTIC ASSISTED LAPAROSCOPIC LYSIS OF ADHESION N/A 06/26/2022   Procedure: XI ROBOTIC ASSISTED LAPAROSCOPIC LYSIS OF ADHESION;  Surgeon: Viktoria Comer SAUNDERS, MD;  Location: [facility];  Service: Gynecology;  Laterality: N/A;   ROBOTIC ASSISTED TOTAL HYSTERECTOMY WITH BILATERAL SALPINGO OOPHERECTOMY N/A 06/26/2022   Procedure: XI ROBOTIC ASSISTED TOTAL HYSTERECTOMY WITH BILATERAL SALPINGO OOPHORECTOMY, MINI LAPAROTOMY;  Surgeon: Viktoria Comer SAUNDERS, MD;  Location: [facility];  Service: Gynecology;  Laterality: N/A;   shoulder surgery for arthritis Left 07/2021   arthroscopic done at [facility]  [place]   Patient Active Problem List   Diagnosis Date Noted   Pelvic adhesions 06/26/2022   Degenerative tear of medial meniscus 01/23/2022   Greater trochanteric bursitis of right hip 06/11/2021   Arthritis of carpometacarpal [facility]) joint of left thumb 06/11/2021   Mass of right lung 07/13/2020   Arthritis 08/03/2019   Rosacea 08/03/2019   Pes anserinus bursitis of right knee 03/28/2019   Pain in right knee 03/22/2019   Thickened endometrium 10/24/2018   Adhesive capsulitis of right shoulder 10/18/2018   Pain in right hand 10/05/2018   Tear of biceps tendon 09/21/2018   Moderate persistent asthma, uncomplicated 06/29/2017   Hallux rigidus of right foot 06/15/2017   Left Achilles tendinitis 06/15/2017   History of bilateral breast cancer 04/23/2017   Mild persistent asthma, uncomplicated 02/12/2017   Other seasonal allergic rhinitis 02/12/2017   History of therapeutic radiation 02/11/2017   Malignant neoplasm of overlapping sites of left breast in female, estrogen receptor positive (HCC) 10/27/2016   Sinusitis 09/01/2016   Hyponatremia 09/01/2016   Acquired absence of left breast  07/16/2016   Malignant neoplasm of upper-outer quadrant of right breast in female, estrogen receptor positive (HCC) 04/19/2016   Dizziness after extension of neck 02/19/2016   Sensorineural hearing loss (SNHL), bilateral 02/19/2016   Tinnitus of both ears 02/19/2016   Bunion of great toe of right foot 04/04/2015   Hemorrhoid 11/23/2014   Anal skin tag 11/23/2014   Incontinence of urine 07/28/2014   Retrocalcaneal bursitis 05/11/2014   Pain in joint, ankle and foot 05/11/2014   Heart burn    Joint pain    Abnormal Pap smear    Osteopenia     PCP: Charlott Dorn LABOR, MD  REFERRING PROVIDER: Leonce Katz, DO  REFERRING DIAG: Cervicalgia; Pain of right hip  THERAPY DIAG:  Cervicalgia  Pain in right hip  Muscle weakness (generalized)  Rationale for Evaluation and Treatment:  Rehabilitation  ONSET DATE: Chronic, approximately 3 months   SUBJECTIVE SUBJECTIVE STATEMENT: Patient reports it has been beneficial for her to pay attention to where her chin and shoulders are. She is almost having more trouble with her right hip today.   Eval: Patient reports neck pain and tightening of her shoulders for close to 3 months with gradual worsening of pain. Occasionally she turns it and feels/hears this scraping and popping. She states she currently feels discomfort in the left side of the neck and the longer she sits the more difficulty it gets, also turning her neck to the left. She denies any pain into the shoulder or arm. She plays a lot of golf and is uncomfortable after she plays. She did have a left arthroscopic shoulder surgery 3-4 years ago and mastectomy due to breast cancer that may have contributed to worsening posture. She also reports some right hip pain on the outside of the hip, and she did get a shot at her last appointment which seemed to help. She does see a trainer 2x/week.   PERTINENT HISTORY:  See PMH above  PAIN:  Are you having pain? Yes:  NPRS scale: 0/10 currently, 7-8/10 at worst Pain location: Left side of neck Pain description: Sharp, tight Aggravating factors: Sitting, turning neck to left, golf Relieving factors: Medication  PRECAUTIONS: None  PATIENT GOALS: Get rid of pain, learn how to manage pain and exercises at home, golf without limitation   OBJECTIVE:  Note: Objective measures were completed at Evaluation unless otherwise noted. PATIENT SURVEYS:  PSFS: 5.33 Sitting extended periods: 6 Turning neck to left: 4 Golf: 6  POSTURE:    Mild rounded shoulder and forward head posture  PALPATION: Tender to palpation left upper trap region   Hypomobility noted of the cervical and thoracic spine  CERVICAL ROM:   Active ROM A/PROM (deg) eval   05/30/2024  Flexion 35   Extension 45   Right lateral flexion 25   Left lateral  flexion 30   Right rotation 70   Left rotation 55 62   (Blank rows = not tested)  UPPER EXTREMITY ROM:  Active ROM Right eval Left eval  Shoulder flexion 150 150  Shoulder extension    Shoulder abduction    Shoulder adduction    Shoulder extension    Shoulder internal rotation    Shoulder external rotation    Elbow flexion    Elbow extension    Wrist flexion    Wrist extension    Wrist ulnar deviation    Wrist radial deviation    Wrist pronation    Wrist supination     (Blank rows =  not tested)  UPPER EXTREMITY MMT:  MMT Right eval Left eval  Shoulder flexion 5 5  Shoulder extension 5 4+  Shoulder abduction 5 5  Shoulder adduction    Shoulder extension    Shoulder internal rotation 5 5  Shoulder external rotation 5 4+  Middle trapezius 4 4-  Lower trapezius    Elbow flexion    Elbow extension    Wrist flexion    Wrist extension    Wrist ulnar deviation    Wrist radial deviation    Wrist pronation    Wrist supination    Grip strength     (Blank rows = not tested)  CERVICAL SPECIAL TESTS:  Cervical radicular testing negative  LOWER EXTREMITY MMT:    MMT Right eval Left eval  Hip flexion    Hip extension    Hip abduction    Hip adduction    Hip internal rotation    Hip external rotation    Knee flexion    Knee extension    Ankle dorsiflexion    Ankle plantarflexion    Ankle inversion    Ankle eversion     (Blank rows = not tested)   FUNCTIONAL TESTS:  DNF endurance: 7 seconds   TREATMENT [facility] Adult PT Treatment:                                                DATE: 05/30/2024 STM left upper trap region Supine suboccipital release with gentle manual traction Seated double ER and scap retraction with blue 2 x 15 Side clamshell with green 3 x 15  Trigger Point Dry Needling  Initial Treatment: Pt instructed on Dry Needling rational, procedures, and possible side effects. Pt instructed to expect mild to moderate muscle soreness later in  the day and/or into the next day.  Pt instructed in methods to reduce muscle soreness. Pt instructed to continue prescribed HEP. Because Dry Needling was performed over or adjacent to a lung field, pt was educated on S/S of pneumothorax and to seek immediate medical attention should they occur.  Patient was educated on signs and symptoms of infection and other risk factors and advised to seek medical attention should they occur.  Patient verbalized understanding of these instructions and education.   Patient Verbal Consent Given: Yes Education Handout Provided: No patient declined Muscles Treated: Left upper trap and levator scap Electrical Stimulation Performed: No Treatment Response/Outcome: Twitch response   PATIENT EDUCATION:  Education details: TPDN, HEP update Person educated: Patient Education method: Explanation, Demonstration, Tactile cues, Verbal cues, and Handouts Education comprehension: verbalized understanding, returned demonstration, verbal cues required, tactile cues required, and needs further education  HOME EXERCISE PROGRAM: Access Code: [email or web address]   ASSESSMENT: CLINICAL IMPRESSION: Patient tolerated therapy well with no adverse effects. Performed TPDN for her left upper trap this visit with good therapeutic benefit. She does exhibit improvement in her cervical rotation to the left this visit. Therapy focused on reducing cervical muscular tension, incorporating periscapular and shoulder strengthening for postural control, and including right hip strengthening with good tolerance. Updated her HEP to include hip strengthening for home. Patient would benefit from continued skilled PT to progress mobility and strength in order to reduce pain and maximize functional ability.  Eval: Patient is a 73 y.o. female who was seen today for physical therapy evaluation and treatment for chronic neck and  right hip pain. Primarily focused on assessing neck this visit. She does exhibit  some spinal mobility deficits and muscular tightness that is impacting her range of motion. She also demonstrates postural deviations and DNF endurance impairment. She does have some residual strength deficit of the left shoulder that may be contributing to her symptoms.    OBJECTIVE IMPAIRMENTS: decreased activity tolerance, decreased ROM, decreased strength, impaired flexibility, postural dysfunction, and pain.   ACTIVITY LIMITATIONS: lifting and sitting  PARTICIPATION LIMITATIONS: community activity  PERSONAL FACTORS: Past/current experiences and Time since onset of injury/illness/exacerbation are also affecting patient's functional outcome.    GOALS: Goals reviewed with patient? Yes  SHORT TERM GOALS: Target date: 06/21/2024  Patient will be I with initial HEP in order to progress with therapy. Baseline: HEP provided at eval Goal status: INITIAL  2.  Patient will demonstrate left cervical rotation >/= 70 deg in order to reduce neck pain and limitation with golf Baseline: 55 deg Goal status: INITIAL  LONG TERM GOALS: Target date: 07/19/2024  Patient will be I with final HEP to maintain progress from PT. Baseline: HEP provided at eval Goal status: INITIAL  2.  Patient will report PSFS >/= 8 in order to indicate improvement in their functional ability. Baseline: 5.33 Goal status: INITIAL  3.  Patient will demonstrate DNF endurance >/= 30 seconds in order to improve postural control and reduce neck pain with sitting Baseline:  Goal status: INITIAL  4.  Patient will report neck pain </= 2/10 with all activity and cervical motion in order to reduce functional limitations Baseline: 7-8/10 pain at worse Goal status: INITIAL   PLAN: PT FREQUENCY: 1x/week  PT DURATION: 8 weeks  PLANNED INTERVENTIONS: 97164- PT Re-evaluation, 97750- Physical Performance Testing, 97110-Therapeutic exercises, 97530- Therapeutic activity, 97112- Neuromuscular re-education, 97535- Self Care,  97140- Manual therapy, 20560 (1-2 muscles), 20561 (3+ muscles)- Dry Needling, Patient/Family education, Taping, Joint mobilization, Joint manipulation, Spinal manipulation, Spinal mobilization, Cryotherapy, and Moist heat  PLAN FOR NEXT SESSION: Review HEP and progress PRN, manual/TPDN for cervical and left upper trap region, progress cervical and thoracic mobility, postural strengthening and DNF endurance; further assessment and treatment of right hip as indicated   Elaine Daring, PT, DPT, LAT, ATC 05/30/24  4:48 PM Phone: [phone] Fax: [phone]

## 2024-05-30 NOTE — Patient Instructions (Signed)
 Access Code: [email or web address] URL: [email or web address] Date: 05/30/2024 Prepared by: Elaine Daring  Exercises - Supine Cervical Retraction with Towel  - 1 x daily - 2 sets - 10 reps - 5 seconds hold - Sidelying Thoracic Lumbar Rotation  - 1 x daily - 2 sets - 10 reps - Seated Assisted Cervical Rotation with Towel  - 1 x daily - 2 sets - 10 reps - Clam with Resistance  - 1 x daily - 3 sets - 15 reps

## 2024-06-01 DIAGNOSIS — H903 Sensorineural hearing loss, bilateral: Secondary | ICD-10-CM | POA: Diagnosis not present

## 2024-06-06 ENCOUNTER — Encounter: Admitting: Physical Therapy

## 2024-06-07 ENCOUNTER — Inpatient Hospital Stay: Payer: Medicare PPO | Admitting: Hematology and Oncology

## 2024-06-13 ENCOUNTER — Other Ambulatory Visit: Payer: Self-pay

## 2024-06-13 ENCOUNTER — Encounter: Payer: Self-pay | Admitting: Physical Therapy

## 2024-06-13 ENCOUNTER — Ambulatory Visit: Admitting: Physical Therapy

## 2024-06-13 DIAGNOSIS — M6281 Muscle weakness (generalized): Secondary | ICD-10-CM | POA: Diagnosis not present

## 2024-06-13 DIAGNOSIS — M25551 Pain in right hip: Secondary | ICD-10-CM | POA: Diagnosis not present

## 2024-06-13 DIAGNOSIS — M542 Cervicalgia: Secondary | ICD-10-CM

## 2024-06-13 NOTE — Patient Instructions (Signed)
 Access Code: [email or web address] URL: [email or web address] Date: 06/13/2024 Prepared by: Elaine Daring  Exercises - Supine Cervical Retraction with Towel  - 1 x daily - 2 sets - 10 reps - 5 seconds hold - Sidelying Thoracic Lumbar Rotation  - 1 x daily - 2 sets - 10 reps - Seated Assisted Cervical Rotation with Towel  - 1 x daily - 2 sets - 10 reps - Bridge with Resistance  - 1 x daily - 2 sets - 10 reps - 5 seconds hold - Standing Anti-Rotation Press with Anchored Resistance  - 1 x daily - 2 sets - 10 reps - 5 seconds hold - Side Stepping with Resistance at Thighs  - 1 x daily - 2 sets - 20 reps - Single Leg Stance  - 1 x daily - 3 reps - 30 seconds hold

## 2024-06-13 NOTE — Therapy (Signed)
 OUTPATIENT PHYSICAL THERAPY TREATMENT   Patient Name: Sabrina Tapia MRN: [id number] DOB:02/11/1951, 73 y.o., female Today's Date: 06/13/2024   END OF SESSION:  PT End of Session - 06/13/24 1513     Visit Number 3    Number of Visits 9    Date for Recertification  07/19/24    Authorization Type [employer] MCR    Authorization Time Period 05/30/2024 - 08/29/2023    Authorization - Visit Number 2    Authorization - Number of Visits 8    Progress Note Due on Visit 10    PT Start Time 1430    PT Stop Time 1515    PT Time Calculation (min) 45 min    Activity Tolerance Patient tolerated treatment well    Behavior During Therapy WFL for tasks assessed/performed            Past Medical History:  Diagnosis Date   Allergy-induced asthma    prn inhaler   Arterial calcification    sees pcp for takes atorvastatin  for   CAD (coronary artery disease)    Constipation    Emphysema of lung (HCC)    Endometrial thickening on ultrasound    Environmental and seasonal allergies    Family history of adverse reaction to anesthesia    pt's father has hx. of being hard to wake up post-op x 1 time after 8 hour whipple surgery   GERD (gastroesophageal reflux disease)    Hemorrhoids    Hisory of Migraines    History of basal cell carcinoma (BCC) excision    right clavicle   History of breast cancer 2017   bilateral   History of radiation therapy    08/19/16- 09/29/16, Right Breast 50 Gy in 25 fractions, Right Breast boost 10 Gy in 5 fractions, Left Breast 50.4 Gy in 28 fractions   Hypertension    OSA (obstructive sleep apnea)    uses dental appliance mild /moderate osa   Osteoarthritis    hands and feet   Osteopenia 02/2016   T score -1.3 FRAX 8.3%/0.7% stable from prior DEXA   Overactive bladder    Stroke (HCC)    Thyroid  nodule 2012   Solitary nodule in the lower pole of the left thyroid  lobe , pt unaware   Wears glasses for reading    Wears hearing aid in both ears    Past Surgical  History:  Procedure Laterality Date   BREAST RECONSTRUCTION WITH PLACEMENT OF TISSUE EXPANDER AND FLEX HD (ACELLULAR HYDRATED DERMIS) Left 07/01/2016   Procedure: BREAST RECONSTRUCTION WITH PLACEMENT OF TISSUE EXPANDER AND  ALLODERM;  Surgeon: Earlis Ranks, MD;  Location: [facility];  Service: Plastics;  Laterality: Left;   BROW LIFT     COLONOSCOPY  2022   DILATATION & CURETTAGE/HYSTEROSCOPY WITH MYOSURE N/A 12/06/2018   Procedure: DILATATION & CURETTAGE/HYSTEROSCOPY WITH MYOSURE;  Surgeon: Cathlyn JAYSON Nikki Bobie FORBES, MD;  Location: [facility];  Service: Gynecology;  Laterality: N/A;  follow 1st case   DILATATION & CURETTAGE/HYSTEROSCOPY WITH MYOSURE N/A 04/21/2022   Procedure: DILATATION & CURETTAGE/HYSTEROSCOPY;  Surgeon: Cathlyn JAYSON Nikki Bobie FORBES, MD;  Location: [facility];  Service: Gynecology;  Laterality: N/A;   INCISION AND DRAINAGE Left 06/02/2016   Procedure: DRAINAGE of left axilla seroma;  Surgeon: Donnice Bury, MD;  Location: [facility];  Service: General;  Laterality: Left;   LIPOSUCTION WITH LIPOFILLING Left 06/11/2018   Procedure: Lipofilling from abdomen to left chest;  Surgeon:  Arelia Filippo, MD;  Location: [facility];  Service: Plastics;  Laterality: Left;   MASTOPEXY Right 04/17/2017   Procedure: RIGHT MASTOPEXY;  Surgeon: Arelia Filippo, MD;  Location: [facility];  Service: Plastics;  Laterality: Right;   NIPPLE SPARING MASTECTOMY Left 07/01/2016   Procedure: LEFT NIPPLE SPARING MASTECTOMY;  Surgeon: Donnice Bury, MD;  Location: [facility];  Service: General;  Laterality: Left;   OPERATIVE ULTRASOUND N/A 04/21/2022   Procedure: OPERATIVE ULTRASOUND;  Surgeon: Cathlyn JAYSON Nikki Bobie FORBES, MD;  Location: [facility];  Service: Gynecology;  Laterality: N/A;   RADIOACTIVE SEED GUIDED PARTIAL MASTECTOMY WITH AXILLARY SENTINEL LYMPH NODE BIOPSY  Right 03/26/2016   Procedure: RIGHT BREAST LUMPECTOMY WITH RADIOACTIVE SEED AND SENTINEL LYMPH NODE BIOPSY;  Surgeon: Donnice Bury, MD;  Location: [facility];  Service: General;  Laterality: Right;   RADIOACTIVE SEED GUIDED PARTIAL MASTECTOMY WITH AXILLARY SENTINEL LYMPH NODE BIOPSY Left 05/20/2016   Procedure: LEFT BREAST RADIOACTIVE SEED (two seeds) GUIDED LUMPECTOMY WITH LEFT AXILLARY SENTINEL LYMPH NODE BIOPSY AND LEFT SEED TARGETED AXILLARY LYMPH NODE EXCISION;  Surgeon: Donnice Bury, MD;  Location: [facility];  Service: General;  Laterality: Left;   RE-EXCISION OF BREAST CANCER,SUPERIOR MARGINS Right 05/20/2016   Procedure: RE-EXCISION OF RIGHT BREAST MARGIN;  Surgeon: Donnice Bury, MD;  Location: [facility];  Service: General;  Laterality: Right;   RE-EXCISION OF BREAST CANCER,SUPERIOR MARGINS Left 06/02/2016   Procedure: RE-EXCISION OF BREAST CANCER,SUPERIOR MARGINS;  Surgeon: Donnice Bury, MD;  Location: [facility];  Service: General;  Laterality: Left;   REMOVAL OF TISSUE EXPANDER AND PLACEMENT OF IMPLANT Left 04/17/2017   Procedure: REMOVAL OF LEFT TISSUE EXPANDER WITH PLACEMENT OF LEFT SILICONE BREAST IMPLANT;  Surgeon: Arelia Filippo, MD;  Location:  [facility];  Service: Plastics;  Laterality: Left;   ROBOTIC ASSISTED LAPAROSCOPIC LYSIS OF ADHESION N/A 06/26/2022   Procedure: XI ROBOTIC ASSISTED LAPAROSCOPIC LYSIS OF ADHESION;  Surgeon: Viktoria Comer SAUNDERS, MD;  Location: [facility];  Service: Gynecology;  Laterality: N/A;   ROBOTIC ASSISTED TOTAL HYSTERECTOMY WITH BILATERAL SALPINGO OOPHERECTOMY N/A 06/26/2022   Procedure: XI ROBOTIC ASSISTED TOTAL HYSTERECTOMY WITH BILATERAL SALPINGO OOPHORECTOMY, MINI LAPAROTOMY;  Surgeon: Viktoria Comer SAUNDERS, MD;  Location: [facility];  Service: Gynecology;  Laterality: N/A;   shoulder surgery for arthritis Left 07/2021   arthroscopic done at [facility]  [place]   Patient Active Problem List   Diagnosis Date Noted   Pelvic adhesions 06/26/2022   Degenerative tear of medial meniscus 01/23/2022   Greater trochanteric bursitis of right hip 06/11/2021   Arthritis of carpometacarpal [facility]) joint of left thumb 06/11/2021   Mass of right lung 07/13/2020   Arthritis 08/03/2019   Rosacea 08/03/2019   Pes anserinus bursitis of right knee 03/28/2019   Pain in right knee 03/22/2019   Thickened endometrium 10/24/2018   Adhesive capsulitis of right shoulder 10/18/2018   Pain in right hand 10/05/2018   Tear of biceps tendon 09/21/2018   Moderate persistent asthma, uncomplicated 06/29/2017   Hallux rigidus of right foot 06/15/2017   Left Achilles tendinitis 06/15/2017   History of bilateral breast cancer 04/23/2017   Mild persistent asthma, uncomplicated 02/12/2017   Other seasonal allergic rhinitis 02/12/2017   History of therapeutic radiation 02/11/2017   Malignant neoplasm of overlapping sites of left breast in female, estrogen receptor positive (HCC) 10/27/2016   Sinusitis 09/01/2016   Hyponatremia 09/01/2016   Acquired absence of left  breast 07/16/2016   Malignant neoplasm of upper-outer quadrant of right breast in female, estrogen receptor positive (HCC) 04/19/2016   Dizziness after extension of neck 02/19/2016   Sensorineural hearing loss (SNHL), bilateral 02/19/2016   Tinnitus of both ears 02/19/2016   Bunion of great toe of right foot 04/04/2015   Hemorrhoid 11/23/2014   Anal skin tag 11/23/2014   Incontinence of urine 07/28/2014   Retrocalcaneal bursitis 05/11/2014   Pain in joint, ankle and foot 05/11/2014   Heart burn    Joint pain    Abnormal Pap smear    Osteopenia     PCP: Charlott Dorn LABOR, MD  REFERRING PROVIDER: Leonce Katz, DO  REFERRING DIAG: Cervicalgia; Pain of right hip  THERAPY DIAG:  Cervicalgia  Pain in right hip  Muscle weakness (generalized)  Rationale for Evaluation and Treatment:  Rehabilitation  ONSET DATE: Chronic, approximately 3 months   SUBJECTIVE SUBJECTIVE STATEMENT: Patient reports her neck feels much better. It is still a little stiff but just like she needs to stretch it some and it doesn't really hurt her. Her right has been bothering her, she did play golf twice since last visit. She did have pain while walking on the golf course.   Eval: Patient reports neck pain and tightening of her shoulders for close to 3 months with gradual worsening of pain. Occasionally she turns it and feels/hears this scraping and popping. She states she currently feels discomfort in the left side of the neck and the longer she sits the more difficulty it gets, also turning her neck to the left. She denies any pain into the shoulder or arm. She plays a lot of golf and is uncomfortable after she plays. She did have a left arthroscopic shoulder surgery 3-4 years ago and mastectomy due to breast cancer that may have contributed to worsening posture. She also reports some right hip pain on the outside of the hip, and she did get a shot at her last appointment which seemed to help. She does see a trainer 2x/week.   PERTINENT HISTORY:  See PMH above  PAIN:  Are you having pain? Yes:  NPRS scale: 0/10 currently, 7-8/10 at worst Pain location: Left side of neck Pain description: Sharp, tight Aggravating factors: Sitting, turning neck to left, golf Relieving factors: Medication  PRECAUTIONS: None  PATIENT GOALS: Get rid of pain, learn how to manage pain and exercises at home, golf without limitation   OBJECTIVE:  Note: Objective measures were completed at Evaluation unless otherwise noted. PATIENT SURVEYS:  PSFS: 5.33 Sitting extended periods: 6 Turning neck to left: 4 Golf: 6  POSTURE:    Mild rounded shoulder and forward head posture  PALPATION: Tender to palpation left upper trap region   Hypomobility noted of the cervical and thoracic spine  CERVICAL ROM:   Active  ROM A/PROM (deg) eval   05/30/2024  Flexion 35   Extension 45   Right lateral flexion 25   Left lateral flexion 30   Right rotation 70   Left rotation 55 62   (Blank rows = not tested)  UPPER EXTREMITY ROM:  Active ROM Right eval Left eval  Shoulder flexion 150 150  Shoulder extension    Shoulder abduction    Shoulder adduction    Shoulder extension    Shoulder internal rotation    Shoulder external rotation    Elbow flexion    Elbow extension    Wrist flexion    Wrist extension    Wrist ulnar  deviation    Wrist radial deviation    Wrist pronation    Wrist supination     (Blank rows = not tested)  UPPER EXTREMITY MMT:  MMT Right eval Left eval  Shoulder flexion 5 5  Shoulder extension 5 4+  Shoulder abduction 5 5  Shoulder adduction    Shoulder extension    Shoulder internal rotation 5 5  Shoulder external rotation 5 4+  Middle trapezius 4 4-  Lower trapezius    Elbow flexion    Elbow extension    Wrist flexion    Wrist extension    Wrist ulnar deviation    Wrist radial deviation    Wrist pronation    Wrist supination    Grip strength     (Blank rows = not tested)  CERVICAL SPECIAL TESTS:  Cervical radicular testing negative  LOWER EXTREMITY MMT:    MMT Right eval Left eval Rt / Lt 06/13/2024  Hip flexion     Hip extension   4- / 4-  Hip abduction   4- / 4  Hip adduction     Hip internal rotation     Hip external rotation     Knee flexion     Knee extension     Ankle dorsiflexion     Ankle plantarflexion     Ankle inversion     Ankle eversion      (Blank rows = not tested)   FUNCTIONAL TESTS:  DNF endurance: 7 seconds   TREATMENT [facility] Adult PT Treatment:                                                DATE: 06/13/2024 Bridge with alternating knee extension 2 x 5 sec each Bridge with green at knees 2 x 10 x 5 sec Standing pelvic tilts in 5-iron posture for core control Standing pelvic rotation in 5-iron posture for lower body  disassociation Standing torso rotation in 5-iron posture for upper body disassociation SLS x 30 sec each Pallof press with L2 powerband 2 x 10 x 3 sec each 1/2 kneeling thoracic rotation at wall x 10 each Lateral band walk with green at knees 2 x 20 down/back   PATIENT EDUCATION:  Education details: HEP update Person educated: Patient Education method: Explanation, Demonstration, Tactile cues, Verbal cues, and Handouts Education comprehension: verbalized understanding, returned demonstration, verbal cues required, tactile cues required, and needs further education  HOME EXERCISE PROGRAM: Access Code: [email or web address]   ASSESSMENT: CLINICAL IMPRESSION: Patient tolerated therapy well with no adverse effects. Therapy focused primarily on the right hip, progressing her core control and and hip strengthening with good tolerance. She does exhibit a limitation with lumbopelvic control and disassociation between the upper and lower body for the golf swing. With the bridge with knee extension she exhibits poor pelvic control and cramping of the hamstring. She did exhibit difficulty with her single leg stance. Patient did report improvement in right hip discomfort following therapy. Updated her HEP to progress lumbopelvic control and strengthening for home. Patient would benefit from continued skilled PT to progress mobility and strength in order to reduce pain and maximize functional ability.   Eval: Patient is a 73 y.o. female who was seen today for physical therapy evaluation and treatment for chronic neck and right hip pain. Primarily focused on assessing neck this visit. She does exhibit some  spinal mobility deficits and muscular tightness that is impacting her range of motion. She also demonstrates postural deviations and DNF endurance impairment. She does have some residual strength deficit of the left shoulder that may be contributing to her symptoms.    OBJECTIVE IMPAIRMENTS: decreased activity  tolerance, decreased ROM, decreased strength, impaired flexibility, postural dysfunction, and pain.   ACTIVITY LIMITATIONS: lifting and sitting  PARTICIPATION LIMITATIONS: community activity  PERSONAL FACTORS: Past/current experiences and Time since onset of injury/illness/exacerbation are also affecting patient's functional outcome.    GOALS: Goals reviewed with patient? Yes  SHORT TERM GOALS: Target date: 06/21/2024  Patient will be I with initial HEP in order to progress with therapy. Baseline: HEP provided at eval Goal status: INITIAL  2.  Patient will demonstrate left cervical rotation >/= 70 deg in order to reduce neck pain and limitation with golf Baseline: 55 deg Goal status: INITIAL  LONG TERM GOALS: Target date: 07/19/2024  Patient will be I with final HEP to maintain progress from PT. Baseline: HEP provided at eval Goal status: INITIAL  2.  Patient will report PSFS >/= 8 in order to indicate improvement in their functional ability. Baseline: 5.33 Goal status: INITIAL  3.  Patient will demonstrate DNF endurance >/= 30 seconds in order to improve postural control and reduce neck pain with sitting Baseline:  Goal status: INITIAL  4.  Patient will report neck pain </= 2/10 with all activity and cervical motion in order to reduce functional limitations Baseline: 7-8/10 pain at worse Goal status: INITIAL   PLAN: PT FREQUENCY: 1x/week  PT DURATION: 8 weeks  PLANNED INTERVENTIONS: 97164- PT Re-evaluation, 97750- Physical Performance Testing, 97110-Therapeutic exercises, 97530- Therapeutic activity, 97112- Neuromuscular re-education, 97535- Self Care, 02859- Manual therapy, 20560 (1-2 muscles), 20561 (3+ muscles)- Dry Needling, Patient/Family education, Taping, Joint mobilization, Joint manipulation, Spinal manipulation, Spinal mobilization, Cryotherapy, and Moist heat  PLAN FOR NEXT SESSION: Review HEP and progress PRN, manual/TPDN for cervical and left upper trap  region, progress cervical and thoracic mobility, postural strengthening and DNF endurance; further assessment and treatment of right hip as indicated   Elaine Daring, PT, DPT, LAT, ATC 06/13/24  4:55 PM Phone: [phone] Fax: [phone]

## 2024-06-20 ENCOUNTER — Inpatient Hospital Stay: Attending: Hematology and Oncology | Admitting: Hematology and Oncology

## 2024-06-20 ENCOUNTER — Other Ambulatory Visit: Payer: Self-pay

## 2024-06-20 ENCOUNTER — Ambulatory Visit: Admitting: Physical Therapy

## 2024-06-20 ENCOUNTER — Encounter: Payer: Self-pay | Admitting: Physical Therapy

## 2024-06-20 VITALS — BP 120/70 | HR 89 | Temp 97.9°F | Resp 18 | Ht 63.0 in | Wt 160.1 lb

## 2024-06-20 DIAGNOSIS — M25551 Pain in right hip: Secondary | ICD-10-CM | POA: Diagnosis not present

## 2024-06-20 DIAGNOSIS — M6281 Muscle weakness (generalized): Secondary | ICD-10-CM

## 2024-06-20 DIAGNOSIS — M542 Cervicalgia: Secondary | ICD-10-CM | POA: Diagnosis not present

## 2024-06-20 DIAGNOSIS — Z170 Estrogen receptor positive status [ER+]: Secondary | ICD-10-CM | POA: Diagnosis not present

## 2024-06-20 DIAGNOSIS — Z17411 Hormone receptor positive with human epidermal growth factor receptor 2 negative status: Secondary | ICD-10-CM | POA: Diagnosis not present

## 2024-06-20 DIAGNOSIS — Z79899 Other long term (current) drug therapy: Secondary | ICD-10-CM | POA: Diagnosis not present

## 2024-06-20 DIAGNOSIS — C50411 Malignant neoplasm of upper-outer quadrant of right female breast: Secondary | ICD-10-CM | POA: Diagnosis not present

## 2024-06-20 DIAGNOSIS — Z79810 Long term (current) use of selective estrogen receptor modulators (SERMs): Secondary | ICD-10-CM | POA: Diagnosis not present

## 2024-06-20 NOTE — Progress Notes (Signed)
 Patient Care Team: Charlott Dorn LABOR, MD as PCP - General (Internal Medicine) Ebbie Cough, MD as Consulting Physician (General Surgery) Causey, Morna Pickle, NP as Nurse Practitioner (Hematology and Oncology) Cathlyn JAYSON Nikki Bobie FORBES, MD as Consulting Physician (Obstetrics and Gynecology)  DIAGNOSIS:  Encounter Diagnosis  Name Primary?   Malignant neoplasm of upper-outer quadrant of right breast in female, estrogen receptor positive (HCC) Yes    SUMMARY OF ONCOLOGIC HISTORY: Oncology History  Malignant neoplasm of upper-outer quadrant of right breast in female, estrogen receptor positive (HCC)  03/26/2016 Surgery   right breast upper outer quadrant lumpectomy 03/26/2016 for a pT1c pN1, stage IIA invasive ductal carcinoma, grade 1, estrogen and progesterone receptor positive, HER-2 nonamplified, with an MIB-1 of 12%              (a) mammaprint from this tumor was read as low risk.          02/2016 Initial Diagnosis   Malignant neoplasm of upper-outer quadrant of right breast in female, estrogen receptor positive (HCC)   03/26/2016 Surgery   a second right breast cancer, invasive lobular, grade 1, measuring 0.6 cm, focally involved the final right medial margin from the 03/26/2016 procedure; this tumor also was estrogen and progesterone receptor positive, HER-2 negative, with an MIB-1 of 3%             (a) medial margin cleared with additional surgery 05/20/2016.   03/2016 -  Anti-estrogen oral therapy   tamoxifen  started 04/18/2016--to be continued a minimum of 5 years, likely followed by anastrozole  x 2 years   08/19/2016 - 09/29/2016 Radiation Therapy   radiation therapy 08/19/16-09/29/16 with capecitabine  sensitization Site/dose:   1) Right breast/ 50Gy in 25 fractions                         2) Right breast boost/ 10 Gy in 5 fractions                         3) Left breast 50.4 Gy in 28 fractions   Malignant neoplasm of overlapping sites of left breast in female,  estrogen receptor positive (HCC)  04/09/2016 Initial Biopsy   two biopsies from the left breast 04/09/2016 and 04/11/2016, 4.6 cm apart, showed             (a) centrally, an invasive ductal carcinoma, grade 1 or 2, with no prognostic panel available             (b) in the upper outer quadrant, invasive ductal carcinoma, grade 2, estrogen receptor 10% positive, progesterone receptor and HER-2 negative, with an MIB-1 of 2%             (c) left axillary lymph node biopsy 04/14/2016 showed invasive ductal carcinoma, estrogen and progesterone receptor negative             (d) mammaprint from (2)(c) was also low risk   03/2016 Initial Diagnosis   Malignant neoplasm of overlapping sites of left breast in female, estrogen receptor positive (HCC)   03/2016 -  Anti-estrogen oral therapy   tamoxifen  started 04/18/2016--to be continued a minimum of 5 years, likely followed by anastrozole  x 2 years   05/20/2016 Surgery   left lumpectomy 05/20/2016 showed a  pT1c pN1 stage IIA  invasive ductal carcinoma, grade 2, estrogen receptor positive, progesterone receptor negative, HER-2 nonamplified, with an MIB-1 of 2%; margins were positive    07/01/2016 Surgery  status post left sided nipple sparing mastectomy with immediate expander placement 07/01/2016 showing multiple foci of residual grade 2 invasive ductal carcinoma, the largest measuring 0.7 cm, with evidence of lymphovascular invasion and multiple close but negative margins   08/19/2016 - 09/29/2016 Radiation Therapy   radiation therapy 08/19/16-09/29/16 with capecitabine  sensitization Site/dose:   1) Right breast/ 50Gy in 25 fractions                         2) Right breast boost/ 10 Gy in 5 fractions                         3) Left breast 50.4 Gy in 28 fractions     CHIEF COMPLIANT: Surveillance of breast cancer  HISTORY OF PRESENT ILLNESS:  History of Present Illness Sabrina Tapia is a 73 year old female with breast cancer on tamoxifen   therapy who presents for follow-up regarding her treatment and surveillance options.  She has been on tamoxifen  therapy for over eight years and experiences intermittent hot flashes, which are managed with Effexor  at half the regular dose, providing relief.  Her father had colon cancer, which is considered when discussing surveillance options for cancer recurrence.     ALLERGIES:  is allergic to bacitracin , hydrocodone , other, penicillins, adhesive [tape], augmentin  [amoxicillin -pot clavulanate], and penicillamine.  MEDICATIONS:  Current Outpatient Medications  Medication Sig Dispense Refill   albuterol  (PROAIR  HFA) 108 (90 Base) MCG/ACT inhaler Inhale 2 puffs into the lungs every 4 (four) hours as needed for wheezing or shortness of breath. 1 Inhaler 1   ALPRAZolam  (XANAX ) 0.25 MG tablet Take 0.25 mg by mouth daily as needed (travel).     amLODipine (NORVASC) 2.5 MG tablet Take 2.5 mg by mouth daily.     atorvastatin (LIPITOR) 10 MG tablet Take 10 mg by mouth at bedtime.     azelastine  (ASTELIN ) 0.1 % nasal spray Place 1 spray into both nostrils daily.     cetirizine (ZYRTEC) 10 MG tablet Take 10 mg by mouth at bedtime.     fluticasone  (FLONASE ) 50 MCG/ACT nasal spray Place 1 spray into both nostrils daily.     hydrocortisone  (ANUSOL -HC) 2.5 % rectal cream Place 1 application rectally as needed for hemorrhoids or itching.     ibuprofen  (ADVIL ) 200 MG tablet Take 200 mg by mouth every 6 (six) hours as needed for moderate pain.     meloxicam  (MOBIC ) 15 MG tablet Take 1 tablet (15 mg total) by mouth daily. 30 tablet 0   meloxicam  (MOBIC ) 15 MG tablet Take 1 tablet (15 mg total) by mouth daily. 30 tablet 0   omeprazole (PRILOSEC) 20 MG capsule Take 20 mg by mouth daily.     OVER THE COUNTER MEDICATION Take 2 capsules by mouth daily. MEGASPOREBIOTIC     psyllium (METAMUCIL) 58.6 % packet Take 1 packet by mouth daily.     tamoxifen  (NOLVADEX ) 20 MG tablet TAKE 1 TABLET(20 MG) BY MOUTH DAILY 90  tablet 3   triamcinolone  (KENALOG) 0.1 % Apply 1 Application topically daily as needed (psoriasis).     valACYclovir  (VALTREX ) 1000 MG tablet Take 1 tablet (1,000 mg total) by mouth 2 (two) times daily. Take for 1 day as directed above as needed. (Patient taking differently: Take 1,000 mg by mouth 2 (two) times daily as needed (fever blisters).) 30 tablet 1   valsartan-hydrochlorothiazide (DIOVAN-HCT) 160-12.5 MG tablet Take 1 tablet by mouth daily.  venlafaxine  XR (EFFEXOR -XR) 75 MG 24 hr capsule TAKE 1 CAPSULE(75 MG) BY MOUTH DAILY WITH BREAKFAST 90 capsule 4   VITAMIN D PO Take 2,000 Units by mouth daily.     RESTASIS 0.05 % ophthalmic emulsion Place 1 drop into both eyes 2 (two) times daily. (Patient not taking: Reported on 06/20/2024)     No current facility-administered medications for this visit.    PHYSICAL EXAMINATION: ECOG PERFORMANCE STATUS: 1 - Symptomatic but completely ambulatory  Vitals:   06/20/24 1502  BP: 120/70  Pulse: 89  Resp: 18  Temp: 97.9 F (36.6 C)  SpO2: 97%   Filed Weights   06/20/24 1502  Weight: 160 lb 1.6 oz (72.6 kg)    Physical Exam   (exam performed in the presence of a chaperone)  LABORATORY DATA:  I have reviewed the data as listed    Latest Ref Rng & Units 06/17/2022    8:44 AM 06/05/2022   10:46 AM 04/21/2022    7:58 AM  CMP  Glucose 70 - 99 mg/dL 85  91  92   BUN 8 - 23 mg/dL 20  15  15    Creatinine 0.44 - 1.00 mg/dL 9.34  9.22  9.49   Sodium 135 - 145 mmol/L 132  135  136   Potassium 3.5 - 5.1 mmol/L 4.3  4.2  3.9   Chloride 98 - 111 mmol/L 100  102  104   CO2 22 - 32 mmol/L 25  27    Calcium 8.9 - 10.3 mg/dL 9.1  9.2    Total Protein 6.5 - 8.1 g/dL 6.8  6.8    Total Bilirubin 0.3 - 1.2 mg/dL 0.4  0.4    Alkaline Phos 38 - 126 U/L 55  57    AST 15 - 41 U/L 24  23    ALT 0 - 44 U/L 20  19      Lab Results  Component Value Date   WBC 5.9 06/17/2022   HGB 13.4 06/17/2022   HCT 40.7 06/17/2022   MCV 92.5 06/17/2022    PLT 330 06/17/2022   NEUTROABS 3.5 06/05/2022    ASSESSMENT & PLAN:  Malignant neoplasm of upper-outer quadrant of right breast in female, estrogen receptor positive (HCC) Dr. Layla patient previously 03/26/2016: T1CN1 stage IIa right breast IDC grade 1 ER/PR positive HER2 negative Ki-67 12%, MammaPrint low risk 03/26/2016: Right lumpectomy 1.4 cm grade 1 IDC with DCIS second right breast cancer: Grade 1 ILC 0.6 cm ER/PR positive HER2 negative Ki-67 3% 04/09/2016: Left breast: Grade 1 through 2 IDC, ER 10%, PR 0%, HER2 negative, Ki-67 2% 04/14/2016: Left axillary lymph node biopsy: Positive for IDC ER/PR negative, MammaPrint low risk 05/20/2016: Left lumpectomy: T1CN1 stage IIa grade 2 IDC ER positive PR negative HER2 negative Ki-67 2% 07/11/2016: Left mastectomy: Multiple foci of residual grade 2 IDC largest 0.7 cm with lymphovascular invasion, reconstruction 07/30/2016 - 09/29/2016: Radiation with capecitabine  04/18/2016: Tamoxifen  started discontinued April 2020 endometrial thickening, started anastrozole  11/18/2018 stopped June 2020, resumed tamoxifen    Tamoxifen  toxicities: Endometrial hypertrophy: Hysterectomy and bilateral salpingo-oophorectomy 06/26/2022: Uterine fibroid, no malignancy Occasional hot flashes which seem to be getting better with the Effexor .   Plan is to continue tamoxifen  for 2 more years  Breast cancer surveillance: Breast examination 06/20/2024: Benign Right breast mammogram: 08/12/2023 at [facility]: Benign, density category B Currently dealing with issues around wrist joint causing tendinitis.   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  Return to clinic in 1 year for follow-up   No orders of the defined types were placed in this encounter.  The patient has a good understanding of the overall plan. she agrees with it. she will call with any problems that may develop before the next visit here.  I personally spent a total of 30 minutes in  the care of the patient today including preparing to see the patient, getting/reviewing separately obtained history, performing a medically appropriate exam/evaluation, counseling and educating, placing orders, referring and communicating with other health care professionals, documenting clinical information in the EHR, independently interpreting results, communicating results, and coordinating care.   Viinay K Sabrina Hodgens, MD 06/20/24

## 2024-06-20 NOTE — Therapy (Signed)
 OUTPATIENT PHYSICAL THERAPY TREATMENT   Patient Name: Sabrina Tapia MRN: [id number] DOB:11-Jan-1951, 73 y.o., female Today's Date: 06/20/2024   END OF SESSION:  PT End of Session - 06/20/24 1302     Visit Number 4    Number of Visits 9    Date for Recertification  07/19/24    Authorization Type [employer] MCR    Authorization Time Period 05/30/2024 - 08/29/2023    Authorization - Visit Number 3    Authorization - Number of Visits 8    Progress Note Due on Visit 10    PT Start Time 1302    PT Stop Time 1345    PT Time Calculation (min) 43 min    Activity Tolerance Patient tolerated treatment well    Behavior During Therapy WFL for tasks assessed/performed             Past Medical History:  Diagnosis Date   Allergy-induced asthma    prn inhaler   Arterial calcification    sees pcp for takes atorvastatin  for   CAD (coronary artery disease)    Constipation    Emphysema of lung (HCC)    Endometrial thickening on ultrasound    Environmental and seasonal allergies    Family history of adverse reaction to anesthesia    pt's father has hx. of being hard to wake up post-op x 1 time after 8 hour whipple surgery   GERD (gastroesophageal reflux disease)    Hemorrhoids    Hisory of Migraines    History of basal cell carcinoma (BCC) excision    right clavicle   History of breast cancer 2017   bilateral   History of radiation therapy    08/19/16- 09/29/16, Right Breast 50 Gy in 25 fractions, Right Breast boost 10 Gy in 5 fractions, Left Breast 50.4 Gy in 28 fractions   Hypertension    OSA (obstructive sleep apnea)    uses dental appliance mild /moderate osa   Osteoarthritis    hands and feet   Osteopenia 02/2016   T score -1.3 FRAX 8.3%/0.7% stable from prior DEXA   Overactive bladder    Stroke (HCC)    Thyroid  nodule 2012   Solitary nodule in the lower pole of the left thyroid  lobe , pt unaware   Wears glasses for reading    Wears hearing aid in both ears    Past  Surgical History:  Procedure Laterality Date   BREAST RECONSTRUCTION WITH PLACEMENT OF TISSUE EXPANDER AND FLEX HD (ACELLULAR HYDRATED DERMIS) Left 07/01/2016   Procedure: BREAST RECONSTRUCTION WITH PLACEMENT OF TISSUE EXPANDER AND  ALLODERM;  Surgeon: Earlis Ranks, MD;  Location: [facility];  Service: Plastics;  Laterality: Left;   BROW LIFT     COLONOSCOPY  2022   DILATATION & CURETTAGE/HYSTEROSCOPY WITH MYOSURE N/A 12/06/2018   Procedure: DILATATION & CURETTAGE/HYSTEROSCOPY WITH MYOSURE;  Surgeon: Cathlyn JAYSON Nikki Bobie FORBES, MD;  Location: [facility];  Service: Gynecology;  Laterality: N/A;  follow 1st case   DILATATION & CURETTAGE/HYSTEROSCOPY WITH MYOSURE N/A 04/21/2022   Procedure: DILATATION & CURETTAGE/HYSTEROSCOPY;  Surgeon: Cathlyn JAYSON Nikki Bobie FORBES, MD;  Location: [facility];  Service: Gynecology;  Laterality: N/A;   INCISION AND DRAINAGE Left 06/02/2016   Procedure: DRAINAGE of left axilla seroma;  Surgeon: Donnice Bury, MD;  Location: [facility];  Service: General;  Laterality: Left;   LIPOSUCTION WITH LIPOFILLING Left 06/11/2018   Procedure: Lipofilling from abdomen to left chest;  Surgeon: Arelia Filippo, MD;  Location: [facility];  Service: Plastics;  Laterality: Left;   MASTOPEXY Right 04/17/2017   Procedure: RIGHT MASTOPEXY;  Surgeon: Arelia Filippo, MD;  Location: [facility];  Service: Plastics;  Laterality: Right;   NIPPLE SPARING MASTECTOMY Left 07/01/2016   Procedure: LEFT NIPPLE SPARING MASTECTOMY;  Surgeon: Donnice Bury, MD;  Location: [facility];  Service: General;  Laterality: Left;   OPERATIVE ULTRASOUND N/A 04/21/2022   Procedure: OPERATIVE ULTRASOUND;  Surgeon: Cathlyn JAYSON Nikki Bobie FORBES, MD;  Location: [facility];  Service: Gynecology;  Laterality: N/A;   RADIOACTIVE SEED GUIDED PARTIAL MASTECTOMY WITH AXILLARY SENTINEL LYMPH  NODE BIOPSY Right 03/26/2016   Procedure: RIGHT BREAST LUMPECTOMY WITH RADIOACTIVE SEED AND SENTINEL LYMPH NODE BIOPSY;  Surgeon: Donnice Bury, MD;  Location: [facility];  Service: General;  Laterality: Right;   RADIOACTIVE SEED GUIDED PARTIAL MASTECTOMY WITH AXILLARY SENTINEL LYMPH NODE BIOPSY Left 05/20/2016   Procedure: LEFT BREAST RADIOACTIVE SEED (two seeds) GUIDED LUMPECTOMY WITH LEFT AXILLARY SENTINEL LYMPH NODE BIOPSY AND LEFT SEED TARGETED AXILLARY LYMPH NODE EXCISION;  Surgeon: Donnice Bury, MD;  Location: [facility];  Service: General;  Laterality: Left;   RE-EXCISION OF BREAST CANCER,SUPERIOR MARGINS Right 05/20/2016   Procedure: RE-EXCISION OF RIGHT BREAST MARGIN;  Surgeon: Donnice Bury, MD;  Location: [facility];  Service: General;  Laterality: Right;   RE-EXCISION OF BREAST CANCER,SUPERIOR MARGINS Left 06/02/2016   Procedure: RE-EXCISION OF BREAST CANCER,SUPERIOR MARGINS;  Surgeon: Donnice Bury, MD;  Location: [facility];  Service: General;  Laterality: Left;   REMOVAL OF TISSUE EXPANDER AND PLACEMENT OF IMPLANT Left 04/17/2017   Procedure: REMOVAL OF LEFT TISSUE EXPANDER WITH PLACEMENT OF LEFT SILICONE BREAST IMPLANT;  Surgeon: Arelia Filippo, MD;  Location:  [facility];  Service: Plastics;  Laterality: Left;   ROBOTIC ASSISTED LAPAROSCOPIC LYSIS OF ADHESION N/A 06/26/2022   Procedure: XI ROBOTIC ASSISTED LAPAROSCOPIC LYSIS OF ADHESION;  Surgeon: Viktoria Comer SAUNDERS, MD;  Location: [facility];  Service: Gynecology;  Laterality: N/A;   ROBOTIC ASSISTED TOTAL HYSTERECTOMY WITH BILATERAL SALPINGO OOPHERECTOMY N/A 06/26/2022   Procedure: XI ROBOTIC ASSISTED TOTAL HYSTERECTOMY WITH BILATERAL SALPINGO OOPHORECTOMY, MINI LAPAROTOMY;  Surgeon: Viktoria Comer SAUNDERS, MD;  Location: [facility];  Service: Gynecology;  Laterality: N/A;   shoulder surgery for arthritis Left 07/2021   arthroscopic done at surgical  center of    Patient Active Problem List   Diagnosis Date Noted   Pelvic adhesions 06/26/2022   Degenerative tear of medial meniscus 01/23/2022   Greater trochanteric bursitis of right hip 06/11/2021   Arthritis of carpometacarpal [facility]) joint of left thumb 06/11/2021   Mass of right lung 07/13/2020   Arthritis 08/03/2019   Rosacea 08/03/2019   Pes anserinus bursitis of right knee 03/28/2019   Pain in right knee 03/22/2019   Thickened endometrium 10/24/2018   Adhesive capsulitis of right shoulder 10/18/2018   Pain in right hand 10/05/2018   Tear of biceps tendon 09/21/2018   Moderate persistent asthma, uncomplicated 06/29/2017   Hallux rigidus of right foot 06/15/2017   Left Achilles tendinitis 06/15/2017   History of bilateral breast cancer 04/23/2017   Mild persistent asthma, uncomplicated 02/12/2017   Other seasonal allergic rhinitis 02/12/2017   History of therapeutic radiation 02/11/2017   Malignant neoplasm of overlapping sites of left breast in female, estrogen receptor positive (HCC) 10/27/2016   Sinusitis 09/01/2016   Hyponatremia 09/01/2016   Acquired absence of  left breast 07/16/2016   Malignant neoplasm of upper-outer quadrant of right breast in female, estrogen receptor positive (HCC) 04/19/2016   Dizziness after extension of neck 02/19/2016   Sensorineural hearing loss (SNHL), bilateral 02/19/2016   Tinnitus of both ears 02/19/2016   Bunion of great toe of right foot 04/04/2015   Hemorrhoid 11/23/2014   Anal skin tag 11/23/2014   Incontinence of urine 07/28/2014   Retrocalcaneal bursitis 05/11/2014   Pain in joint, ankle and foot 05/11/2014   Heart burn    Joint pain    Abnormal Pap smear    Osteopenia     PCP: Charlott Dorn LABOR, MD  REFERRING PROVIDER: Leonce Katz, DO  REFERRING DIAG: Cervicalgia; Pain of right hip  THERAPY DIAG:  Cervicalgia  Pain in right hip  Muscle weakness (generalized)  Rationale for Evaluation and  Treatment: Rehabilitation  ONSET DATE: Chronic, approximately 3 months   SUBJECTIVE SUBJECTIVE STATEMENT: Patient reports her right hip is bothering her a little because she stopped taking the meloxicam . She is also getting some discomfort in the thigh. She does report the neck is feeling better, still as a little tightness in the neck.   Eval: Patient reports neck pain and tightening of her shoulders for close to 3 months with gradual worsening of pain. Occasionally she turns it and feels/hears this scraping and popping. She states she currently feels discomfort in the left side of the neck and the longer she sits the more difficulty it gets, also turning her neck to the left. She denies any pain into the shoulder or arm. She plays a lot of golf and is uncomfortable after she plays. She did have a left arthroscopic shoulder surgery 3-4 years ago and mastectomy due to breast cancer that may have contributed to worsening posture. She also reports some right hip pain on the outside of the hip, and she did get a shot at her last appointment which seemed to help. She does see a trainer 2x/week.   PERTINENT HISTORY:  See PMH above  PAIN:  Are you having pain? Yes:  NPRS scale: 0/10 currently, 7-8/10 at worst Pain location: Left side of neck Pain description: Sharp, tight Aggravating factors: Sitting, turning neck to left, golf Relieving factors: Medication  NPRS scale: 4-5/10 Pain location: Right hip Pain description: Achy Aggravating factors: Standing, walking,  Relieving factors: Medication  PRECAUTIONS: None  PATIENT GOALS: Get rid of pain, learn how to manage pain and exercises at home, golf without limitation   OBJECTIVE:  Note: Objective measures were completed at Evaluation unless otherwise noted. PATIENT SURVEYS:  PSFS: 5.33 Sitting extended periods: 6 Turning neck to left: 4 Golf: 6  POSTURE:    Mild rounded shoulder and forward head posture  PALPATION: Tender to  palpation left upper trap region   Hypomobility noted of the cervical and thoracic spine  CERVICAL ROM:   Active ROM A/PROM (deg) eval   05/30/2024  Flexion 35   Extension 45   Right lateral flexion 25   Left lateral flexion 30   Right rotation 70   Left rotation 55 62   (Blank rows = not tested)  UPPER EXTREMITY ROM:  Active ROM Right eval Left eval  Shoulder flexion 150 150  Shoulder extension    Shoulder abduction    Shoulder adduction    Shoulder extension    Shoulder internal rotation    Shoulder external rotation    Elbow flexion    Elbow extension    Wrist flexion  Wrist extension    Wrist ulnar deviation    Wrist radial deviation    Wrist pronation    Wrist supination     (Blank rows = not tested)  UPPER EXTREMITY MMT:  MMT Right eval Left eval  Shoulder flexion 5 5  Shoulder extension 5 4+  Shoulder abduction 5 5  Shoulder adduction    Shoulder extension    Shoulder internal rotation 5 5  Shoulder external rotation 5 4+  Middle trapezius 4 4-  Lower trapezius    Elbow flexion    Elbow extension    Wrist flexion    Wrist extension    Wrist ulnar deviation    Wrist radial deviation    Wrist pronation    Wrist supination    Grip strength     (Blank rows = not tested)  CERVICAL SPECIAL TESTS:  Cervical radicular testing negative  LOWER EXTREMITY MMT:    MMT Right eval Left eval Rt / Lt 06/13/2024  Hip flexion     Hip extension   4- / 4-  Hip abduction   4- / 4  Hip adduction     Hip internal rotation     Hip external rotation     Knee flexion     Knee extension     Ankle dorsiflexion     Ankle plantarflexion     Ankle inversion     Ankle eversion      (Blank rows = not tested)   FUNCTIONAL TESTS:  DNF endurance: 7 seconds   TREATMENT [facility] Adult PT Treatment:                                                DATE: 06/20/2024 Hooklying glute set SL bridge 2 x 8 x 3 sec Side clamshell 2 x 15 Side reverse clamshell 2 x  15 SLS with pattern assist using red band 3 x 10 sec each SLS 2 x 30 sec each Pallof press with L2 powerband 2 x 10 each Forward 8 runner step-up 2 x 10   PATIENT EDUCATION:  Education details: HEP update Person educated: Patient Education method: Explanation, Demonstration, Tactile cues, Verbal cues, and Handouts Education comprehension: verbalized understanding, returned demonstration, verbal cues required, tactile cues required, and needs further education  HOME EXERCISE PROGRAM: Access Code: [email or web address]   ASSESSMENT: CLINICAL IMPRESSION: Patient tolerated therapy well with no adverse effects. Therapy continues to focus on progressing her hip strength and control, and core stabilization with good tolerance. She does continue to exhibit glute strength deficit on the right with hamstring cramping performing the bridge exercise, so worked on glute activation exercise. Continued with SL balance and control with use of band for pattern assist. Updated her HEP to progress his strength and control. Patient would benefit from continued skilled PT to progress mobility and strength in order to reduce pain and maximize functional ability.   Eval: Patient is a 73 y.o. female who was seen today for physical therapy evaluation and treatment for chronic neck and right hip pain. Primarily focused on assessing neck this visit. She does exhibit some spinal mobility deficits and muscular tightness that is impacting her range of motion. She also demonstrates postural deviations and DNF endurance impairment. She does have some residual strength deficit of the left shoulder that may be contributing to her symptoms.    OBJECTIVE IMPAIRMENTS:  decreased activity tolerance, decreased ROM, decreased strength, impaired flexibility, postural dysfunction, and pain.   ACTIVITY LIMITATIONS: lifting and sitting  PARTICIPATION LIMITATIONS: community activity  PERSONAL FACTORS: Past/current experiences and Time since  onset of injury/illness/exacerbation are also affecting patient's functional outcome.    GOALS: Goals reviewed with patient? Yes  SHORT TERM GOALS: Target date: 06/21/2024  Patient will be I with initial HEP in order to progress with therapy. Baseline: HEP provided at eval Goal status: INITIAL  2.  Patient will demonstrate left cervical rotation >/= 70 deg in order to reduce neck pain and limitation with golf Baseline: 55 deg Goal status: INITIAL  LONG TERM GOALS: Target date: 07/19/2024  Patient will be I with final HEP to maintain progress from PT. Baseline: HEP provided at eval Goal status: INITIAL  2.  Patient will report PSFS >/= 8 in order to indicate improvement in their functional ability. Baseline: 5.33 Goal status: INITIAL  3.  Patient will demonstrate DNF endurance >/= 30 seconds in order to improve postural control and reduce neck pain with sitting Baseline:  Goal status: INITIAL  4.  Patient will report neck pain </= 2/10 with all activity and cervical motion in order to reduce functional limitations Baseline: 7-8/10 pain at worse Goal status: INITIAL   PLAN: PT FREQUENCY: 1x/week  PT DURATION: 8 weeks  PLANNED INTERVENTIONS: 97164- PT Re-evaluation, 97750- Physical Performance Testing, 97110-Therapeutic exercises, 97530- Therapeutic activity, 97112- Neuromuscular re-education, 97535- Self Care, 02859- Manual therapy, 20560 (1-2 muscles), 20561 (3+ muscles)- Dry Needling, Patient/Family education, Taping, Joint mobilization, Joint manipulation, Spinal manipulation, Spinal mobilization, Cryotherapy, and Moist heat  PLAN FOR NEXT SESSION: Review HEP and progress PRN, manual/TPDN for cervical and left upper trap region, progress cervical and thoracic mobility, postural strengthening and DNF endurance; further assessment and treatment of right hip as indicated   Elaine Daring, PT, DPT, LAT, ATC 06/20/24  2:13 PM Phone: [phone] Fax:  [phone]

## 2024-06-20 NOTE — Assessment & Plan Note (Signed)
 Dr. Layla patient previously 03/26/2016: T1CN1 stage IIa right breast IDC grade 1 ER/PR positive HER2 negative Ki-67 12%, MammaPrint low risk 03/26/2016: Right lumpectomy 1.4 cm grade 1 IDC with DCIS second right breast cancer: Grade 1 ILC 0.6 cm ER/PR positive HER2 negative Ki-67 3% 04/09/2016: Left breast: Grade 1 through 2 IDC, ER 10%, PR 0%, HER2 negative, Ki-67 2% 04/14/2016: Left axillary lymph node biopsy: Positive for IDC ER/PR negative, MammaPrint low risk 05/20/2016: Left lumpectomy: T1CN1 stage IIa grade 2 IDC ER positive PR negative HER2 negative Ki-67 2% 07/11/2016: Left mastectomy: Multiple foci of residual grade 2 IDC largest 0.7 cm with lymphovascular invasion, reconstruction 07/30/2016 - 09/29/2016: Radiation with capecitabine  04/18/2016: Tamoxifen  started discontinued April 2024 endometrial thickening, started anastrozole  11/18/2018 stopped June 2020, resumed tamoxifen    Tamoxifen  toxicities: Endometrial hypertrophy: Hysterectomy and bilateral salpingo-oophorectomy 06/26/2022: Uterine fibroid, no malignancy Occasional hot flashes which seem to be getting better with the Effexor .   Breast cancer surveillance: Breast examination 06/20/2024: Benign Right breast mammogram: 08/12/2023 at [facility]: Benign, density category B Currently dealing with issues around wrist joint causing tendinitis.   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   Return to clinic in 1 year for follow-up

## 2024-06-20 NOTE — Patient Instructions (Signed)
 Access Code: [email or web address] URL: [email or web address] Date: 06/20/2024 Prepared by: Elaine Daring  Exercises - Supine Cervical Retraction with Towel  - 1 x daily - 2 sets - 10 reps - 5 seconds hold - Sidelying Thoracic Lumbar Rotation  - 1 x daily - 2 sets - 10 reps - Seated Assisted Cervical Rotation with Towel  - 1 x daily - 2 sets - 10 reps - Clamshell  - 1 x daily - 2 sets - 15 reps - Sidelying Reverse Clamshell  - 1 x daily - 2 sets - 15 reps - Bridge with Resistance  - 1 x daily - 2 sets - 10 reps - 5 seconds hold - Standing Anti-Rotation Press with Anchored Resistance  - 1 x daily - 2 sets - 10 reps - 5 seconds hold - Side Stepping with Resistance at Thighs  - 1 x daily - 2 sets - 20 reps - Single Leg Stance  - 1 x daily - 3 reps - 30 seconds hold

## 2024-06-27 ENCOUNTER — Ambulatory Visit

## 2024-06-27 ENCOUNTER — Ambulatory Visit: Admitting: Physical Therapy

## 2024-06-27 ENCOUNTER — Ambulatory Visit: Admitting: Sports Medicine

## 2024-06-27 ENCOUNTER — Encounter: Payer: Self-pay | Admitting: Physical Therapy

## 2024-06-27 ENCOUNTER — Other Ambulatory Visit: Payer: Self-pay

## 2024-06-27 VITALS — BP 124/60 | HR 68 | Ht 63.0 in | Wt 161.0 lb

## 2024-06-27 DIAGNOSIS — M6281 Muscle weakness (generalized): Secondary | ICD-10-CM

## 2024-06-27 DIAGNOSIS — M25551 Pain in right hip: Secondary | ICD-10-CM

## 2024-06-27 DIAGNOSIS — M160 Bilateral primary osteoarthritis of hip: Secondary | ICD-10-CM | POA: Diagnosis not present

## 2024-06-27 DIAGNOSIS — M542 Cervicalgia: Secondary | ICD-10-CM | POA: Diagnosis not present

## 2024-06-27 DIAGNOSIS — S76011A Strain of muscle, fascia and tendon of right hip, initial encounter: Secondary | ICD-10-CM

## 2024-06-27 NOTE — Progress Notes (Signed)
 Ben Devorah Givhan D.[facility] [address] [place] [address] Phone: [phone]   Assessment and Plan:     1. Right hip pain (Primary) 2. Strain of tendon of right gluteus muscle -Chronic with exacerbation, subsequent visit - Continued pain of right lateral/posterior hip.  Patient had had relief in the past with greater trochanteric CSI, however patient had no significant relief with greater trochanteric CSI on 05/12/2024.  Symptoms most consistent with gluteal muscle strain, gluteal tendinitis based on repeat physical exam today - Patient elected for gluteal tendon CSI.  Tolerated well per note below - Use meloxicam  15 mg daily as needed for breakthrough pain.  Recommend limiting chronic NSAIDs to 1-2 doses per week to prevent long-term side effects. Use Tylenol  500 to 1000 mg tablets 2-3 times a day as needed for day-to-day pain relief.   Patient recently completed 3-week course of meloxicam  which provided mild to moderate relief - Continue HEP and physical therapy focused on hip and gluteal muscle strengthening, stabilization  Procedure: Ultrasound Guided Glute Insertion Injection/Needling.   Side: Right Diagnosis: Gluteal tendinopathy US  Indication:  - accuracy is paramount for diagnosis - to ensure therapeutic efficacy or procedural success - to reduce procedural risk  After explaining the procedure, viable alternatives, risks, and answering any questions, consent was given verbally. The site was cleaned with chlorhexidine  prep. An ultrasound transducer was placed over the greater trochanter.    The glute medius and minimus were identified.  There was not fluid surrounding the tendons.  The tendons were normal.  Musculotendinous junction had irregular stranding without definitive tear.  2ml of 1% lidocaine  without epinephrine  and 1 mL Kenalog 40 mg/ML was advanced under ultrasound guidance and injected along gluteal muscle/tendon.  This was well  tolerated.  The needle was removed and dressing placed and post injection instructions were given including  a discussion of likely return of pain today after the anesthetic wears off (with the possibility of worsened pain).     Pt was advised to call or return to clinic if these symptoms worsen or fail to improve as anticipated. Images permanently stored.   3. Cervicalgia -Chronic with exacerbation, subsequent visit - Overall improvement in chronic symptoms.  Symptoms improving with HEP, physical therapy, dry needling - Continue HEP - Use meloxicam  15 mg daily as needed for breakthrough pain.  Recommend limiting chronic NSAIDs to 1-2 doses per week to prevent long-term side effects. Use Tylenol  500 to 1000 mg tablets 2-3 times a day as needed for day-to-day pain relief.       Pertinent previous records reviewed include none   Follow Up: 4 to 6 weeks for reevaluation.  If no improvement or worsening of symptoms, could consider hip MRI   Subjective:   I, Claretha Schimke am a scribe for Dr. Leonce.   Chief Complaint: upper trap pain  HPI:   03/11/2024 Patient is a 73 year old female with upper trap pain. Patient states that she is having cervical and L shoulder pain for past 6 weeks. Took meloxicam  for 8 days which was helpful. Pain occurs with cervical rotation. Still able to workout as pain is always after activity. Had 2 migraines in past month. Hx L shoulder arthroscopy.    04/05/2024 Patient states neck pain is a little better but not much    05/12/2024 Patient states upper trap is tight. Right hip pain, decreased ROM. Doesn't know if meloxicam  has helped.   06/27/24 Patient states neck is fine.  Had dry needling from Dr. Elaine. Hip has gone south. Been trying to strengthen the muscles in PT but can still feel pain in it.   Relevant Historical Information: history of breast cancer   Additional pertinent review of systems negative.   Current Outpatient Medications:     albuterol  (PROAIR  HFA) 108 (90 Base) MCG/ACT inhaler, Inhale 2 puffs into the lungs every 4 (four) hours as needed for wheezing or shortness of breath., Disp: 1 Inhaler, Rfl: 1   ALPRAZolam  (XANAX ) 0.25 MG tablet, Take 0.25 mg by mouth daily as needed (travel)., Disp: , Rfl:    amLODipine (NORVASC) 2.5 MG tablet, Take 2.5 mg by mouth daily., Disp: , Rfl:    atorvastatin (LIPITOR) 10 MG tablet, Take 10 mg by mouth at bedtime., Disp: , Rfl:    azelastine  (ASTELIN ) 0.1 % nasal spray, Place 1 spray into both nostrils daily., Disp: , Rfl:    cetirizine (ZYRTEC) 10 MG tablet, Take 10 mg by mouth at bedtime., Disp: , Rfl:    fluticasone  (FLONASE ) 50 MCG/ACT nasal spray, Place 1 spray into both nostrils daily., Disp: , Rfl:    hydrocortisone  (ANUSOL -HC) 2.5 % rectal cream, Place 1 application rectally as needed for hemorrhoids or itching., Disp: , Rfl:    ibuprofen  (ADVIL ) 200 MG tablet, Take 200 mg by mouth every 6 (six) hours as needed for moderate pain., Disp: , Rfl:    meloxicam  (MOBIC ) 15 MG tablet, Take 1 tablet (15 mg total) by mouth daily., Disp: 30 tablet, Rfl: 0   meloxicam  (MOBIC ) 15 MG tablet, Take 1 tablet (15 mg total) by mouth daily., Disp: 30 tablet, Rfl: 0   omeprazole (PRILOSEC) 20 MG capsule, Take 20 mg by mouth daily., Disp: , Rfl:    OVER THE COUNTER MEDICATION, Take 2 capsules by mouth daily. MEGASPOREBIOTIC, Disp: , Rfl:    psyllium (METAMUCIL) 58.6 % packet, Take 1 packet by mouth daily., Disp: , Rfl:    RESTASIS 0.05 % ophthalmic emulsion, Place 1 drop into both eyes 2 (two) times daily. (Patient not taking: Reported on 06/20/2024), Disp: , Rfl:    tamoxifen  (NOLVADEX ) 20 MG tablet, TAKE 1 TABLET(20 MG) BY MOUTH DAILY, Disp: 90 tablet, Rfl: 3   triamcinolone  (KENALOG) 0.1 %, Apply 1 Application topically daily as needed (psoriasis)., Disp: , Rfl:    valACYclovir  (VALTREX ) 1000 MG tablet, Take 1 tablet (1,000 mg total) by mouth 2 (two) times daily. Take for 1 day as directed above as  needed. (Patient taking differently: Take 1,000 mg by mouth 2 (two) times daily as needed (fever blisters).), Disp: 30 tablet, Rfl: 1   valsartan-hydrochlorothiazide (DIOVAN-HCT) 160-12.5 MG tablet, Take 1 tablet by mouth daily., Disp: , Rfl:    venlafaxine  XR (EFFEXOR -XR) 75 MG 24 hr capsule, TAKE 1 CAPSULE(75 MG) BY MOUTH DAILY WITH BREAKFAST, Disp: 90 capsule, Rfl: 4   VITAMIN D PO, Take 2,000 Units by mouth daily., Disp: , Rfl:    Objective:     Vitals:   06/27/24 1446  BP: 124/60  Pulse: 68  SpO2: 97%  Weight: 161 lb (73 kg)  Height: 5' 3 (1.6 m)      Body mass index is 28.52 kg/m.    Physical Exam:    General: awake, alert, and oriented no acute distress, nontoxic Skin: no suspicious lesions or rashes Neuro:sensation intact distally with no deficits, normal muscle tone, no atrophy, strength 5/5 in all tested lower ext groups Psych: normal mood and affect, speech clear   Right hip:  No deformity, swelling or wasting ROM Flexion 90, ext 30, IR 45, ER 45 TTP greater trochanter, gluteal musculature, IT band NTTP over the hip flexors, si joint, lumbar spine Negative log roll with FROM Negative FABER Negative FADIR Negative Piriformis test for radiculopathy, though reproduced radiating pain Positive right trendelenberg Gait normal    Electronically signed by:  [name] 3:29 PM 06/27/24

## 2024-06-27 NOTE — Patient Instructions (Signed)
 Access Code: [email or web address] URL: [email or web address] Date: 06/27/2024 Prepared by: Elaine Daring  Exercises - Supine Cervical Retraction with Towel  - 1 x daily - 2 sets - 10 reps - 5 seconds hold - Sidelying Thoracic Lumbar Rotation  - 1 x daily - 2 sets - 10 reps - Seated Assisted Cervical Rotation with Towel  - 1 x daily - 2 sets - 10 reps - Clamshell  - 1 x daily - 2 sets - 15 reps - Bridge with Resistance  - 1 x daily - 2 sets - 10 reps - 5 seconds hold - Standing Anti-Rotation Press with Anchored Resistance  - 1 x daily - 2 sets - 10 reps - 5 seconds hold - Side Stepping with Resistance at Thighs  - 1 x daily - 2 sets - 20 reps - Single Leg Stance  - 1 x daily - 3 reps - 30 seconds hold - Standing Isometric Hip Abduction with Knee at 90 at Wall  - 1 x daily - 5 reps - 20 seconds hold

## 2024-06-27 NOTE — Therapy (Signed)
 OUTPATIENT PHYSICAL THERAPY TREATMENT   Patient Name: Sabrina Tapia MRN: [id number] DOB:Jan 13, 1951, 73 y.o., female Today's Date: 06/27/2024   END OF SESSION:  PT End of Session - 06/27/24 1607     Visit Number 5    Number of Visits 9    Date for Recertification  07/19/24    Authorization Type [employer] MCR    Authorization Time Period 05/30/2024 - 08/29/2023    Authorization - Visit Number 4    Authorization - Number of Visits 8    Progress Note Due on Visit 10    PT Start Time 1528    PT Stop Time 1606    PT Time Calculation (min) 38 min    Activity Tolerance Patient tolerated treatment well    Behavior During Therapy WFL for tasks assessed/performed              Past Medical History:  Diagnosis Date   Allergy-induced asthma    prn inhaler   Arterial calcification    sees pcp for takes atorvastatin  for   CAD (coronary artery disease)    Constipation    Emphysema of lung (HCC)    Endometrial thickening on ultrasound    Environmental and seasonal allergies    Family history of adverse reaction to anesthesia    pt's father has hx. of being hard to wake up post-op x 1 time after 8 hour whipple surgery   GERD (gastroesophageal reflux disease)    Hemorrhoids    Hisory of Migraines    History of basal cell carcinoma (BCC) excision    right clavicle   History of breast cancer 2017   bilateral   History of radiation therapy    08/19/16- 09/29/16, Right Breast 50 Gy in 25 fractions, Right Breast boost 10 Gy in 5 fractions, Left Breast 50.4 Gy in 28 fractions   Hypertension    OSA (obstructive sleep apnea)    uses dental appliance mild /moderate osa   Osteoarthritis    hands and feet   Osteopenia 02/2016   T score -1.3 FRAX 8.3%/0.7% stable from prior DEXA   Overactive bladder    Stroke (HCC)    Thyroid  nodule 2012   Solitary nodule in the lower pole of the left thyroid  lobe , pt unaware   Wears glasses for reading    Wears hearing aid in both ears    Past  Surgical History:  Procedure Laterality Date   BREAST RECONSTRUCTION WITH PLACEMENT OF TISSUE EXPANDER AND FLEX HD (ACELLULAR HYDRATED DERMIS) Left 07/01/2016   Procedure: BREAST RECONSTRUCTION WITH PLACEMENT OF TISSUE EXPANDER AND  ALLODERM;  Surgeon: Earlis Ranks, MD;  Location: [facility];  Service: Plastics;  Laterality: Left;   BROW LIFT     COLONOSCOPY  2022   DILATATION & CURETTAGE/HYSTEROSCOPY WITH MYOSURE N/A 12/06/2018   Procedure: DILATATION & CURETTAGE/HYSTEROSCOPY WITH MYOSURE;  Surgeon: Cathlyn JAYSON Nikki Bobie FORBES, MD;  Location: [facility];  Service: Gynecology;  Laterality: N/A;  follow 1st case   DILATATION & CURETTAGE/HYSTEROSCOPY WITH MYOSURE N/A 04/21/2022   Procedure: DILATATION & CURETTAGE/HYSTEROSCOPY;  Surgeon: Cathlyn JAYSON Nikki Bobie FORBES, MD;  Location: [facility];  Service: Gynecology;  Laterality: N/A;   INCISION AND DRAINAGE Left 06/02/2016   Procedure: DRAINAGE of left axilla seroma;  Surgeon: Donnice Bury, MD;  Location: [facility];  Service: General;  Laterality: Left;   LIPOSUCTION WITH LIPOFILLING Left 06/11/2018   Procedure: Lipofilling from abdomen to left chest;  Surgeon: Arelia Filippo, MD;  Location: [facility];  Service: Plastics;  Laterality: Left;   MASTOPEXY Right 04/17/2017   Procedure: RIGHT MASTOPEXY;  Surgeon: Arelia Filippo, MD;  Location: [facility];  Service: Plastics;  Laterality: Right;   NIPPLE SPARING MASTECTOMY Left 07/01/2016   Procedure: LEFT NIPPLE SPARING MASTECTOMY;  Surgeon: Donnice Bury, MD;  Location: [facility];  Service: General;  Laterality: Left;   OPERATIVE ULTRASOUND N/A 04/21/2022   Procedure: OPERATIVE ULTRASOUND;  Surgeon: Cathlyn JAYSON Nikki Bobie FORBES, MD;  Location: [facility];  Service: Gynecology;  Laterality: N/A;   RADIOACTIVE SEED GUIDED PARTIAL MASTECTOMY WITH AXILLARY SENTINEL LYMPH  NODE BIOPSY Right 03/26/2016   Procedure: RIGHT BREAST LUMPECTOMY WITH RADIOACTIVE SEED AND SENTINEL LYMPH NODE BIOPSY;  Surgeon: Donnice Bury, MD;  Location: [facility];  Service: General;  Laterality: Right;   RADIOACTIVE SEED GUIDED PARTIAL MASTECTOMY WITH AXILLARY SENTINEL LYMPH NODE BIOPSY Left 05/20/2016   Procedure: LEFT BREAST RADIOACTIVE SEED (two seeds) GUIDED LUMPECTOMY WITH LEFT AXILLARY SENTINEL LYMPH NODE BIOPSY AND LEFT SEED TARGETED AXILLARY LYMPH NODE EXCISION;  Surgeon: Donnice Bury, MD;  Location: [facility];  Service: General;  Laterality: Left;   RE-EXCISION OF BREAST CANCER,SUPERIOR MARGINS Right 05/20/2016   Procedure: RE-EXCISION OF RIGHT BREAST MARGIN;  Surgeon: Donnice Bury, MD;  Location: [facility];  Service: General;  Laterality: Right;   RE-EXCISION OF BREAST CANCER,SUPERIOR MARGINS Left 06/02/2016   Procedure: RE-EXCISION OF BREAST CANCER,SUPERIOR MARGINS;  Surgeon: Donnice Bury, MD;  Location: [facility];  Service: General;  Laterality: Left;   REMOVAL OF TISSUE EXPANDER AND PLACEMENT OF IMPLANT Left 04/17/2017   Procedure: REMOVAL OF LEFT TISSUE EXPANDER WITH PLACEMENT OF LEFT SILICONE BREAST IMPLANT;  Surgeon: Arelia Filippo, MD;  Location: [facility];  Service: Plastics;  Laterality: Left;   ROBOTIC ASSISTED LAPAROSCOPIC LYSIS OF ADHESION N/A 06/26/2022   Procedure: XI ROBOTIC ASSISTED LAPAROSCOPIC LYSIS OF ADHESION;  Surgeon: Viktoria Comer SAUNDERS, MD;  Location: [facility];  Service: Gynecology;  Laterality: N/A;   ROBOTIC ASSISTED TOTAL HYSTERECTOMY WITH BILATERAL SALPINGO OOPHERECTOMY N/A 06/26/2022   Procedure: XI ROBOTIC ASSISTED TOTAL HYSTERECTOMY WITH BILATERAL SALPINGO OOPHORECTOMY, MINI LAPAROTOMY;  Surgeon: Viktoria Comer SAUNDERS, MD;  Location: [facility];  Service: Gynecology;  Laterality: N/A;   shoulder surgery for arthritis Left 07/2021   arthroscopic done at [facility] [place]   Patient Active Problem List   Diagnosis Date Noted   Pelvic adhesions 06/26/2022   Degenerative tear of medial meniscus 01/23/2022   Greater trochanteric bursitis of right hip 06/11/2021   Arthritis of carpometacarpal [facility]) joint of left thumb 06/11/2021   Mass of right lung 07/13/2020   Arthritis 08/03/2019   Rosacea 08/03/2019   Pes anserinus bursitis of right knee 03/28/2019   Pain in right knee 03/22/2019   Thickened endometrium 10/24/2018   Adhesive capsulitis of right shoulder 10/18/2018   Pain in right hand 10/05/2018   Tear of biceps tendon 09/21/2018   Moderate persistent asthma, uncomplicated 06/29/2017   Hallux rigidus of right foot 06/15/2017   Left Achilles tendinitis 06/15/2017   History of bilateral breast cancer 04/23/2017   Mild persistent asthma, uncomplicated 02/12/2017   Other seasonal allergic rhinitis 02/12/2017   History of therapeutic radiation 02/11/2017   Malignant neoplasm of overlapping sites of left breast in female, estrogen receptor positive (HCC) 10/27/2016   Sinusitis 09/01/2016   Hyponatremia 09/01/2016   Acquired absence of  left breast 07/16/2016   Malignant neoplasm of upper-outer quadrant of right breast in female, estrogen receptor positive (HCC) 04/19/2016   Dizziness after extension of neck 02/19/2016   Sensorineural hearing loss (SNHL), bilateral 02/19/2016   Tinnitus of both ears 02/19/2016   Bunion of great toe of right foot 04/04/2015   Hemorrhoid 11/23/2014   Anal skin tag 11/23/2014   Incontinence of urine 07/28/2014   Retrocalcaneal bursitis 05/11/2014   Pain in joint, ankle and foot 05/11/2014   Heart burn    Joint pain    Abnormal Pap smear    Osteopenia     PCP: Charlott Dorn LABOR, MD  REFERRING PROVIDER: Leonce Katz, DO  REFERRING DIAG: Cervicalgia; Pain of right hip  THERAPY DIAG:  Pain in right hip  Cervicalgia  Muscle weakness (generalized)  Rationale for Evaluation and  Treatment: Rehabilitation  ONSET DATE: Chronic, approximately 3 months   SUBJECTIVE SUBJECTIVE STATEMENT: Patient reports her right hip has been aggravating and then the last week or so lying on her right side has been painful.   Eval: Patient reports neck pain and tightening of her shoulders for close to 3 months with gradual worsening of pain. Occasionally she turns it and feels/hears this scraping and popping. She states she currently feels discomfort in the left side of the neck and the longer she sits the more difficulty it gets, also turning her neck to the left. She denies any pain into the shoulder or arm. She plays a lot of golf and is uncomfortable after she plays. She did have a left arthroscopic shoulder surgery 3-4 years ago and mastectomy due to breast cancer that may have contributed to worsening posture. She also reports some right hip pain on the outside of the hip, and she did get a shot at her last appointment which seemed to help. She does see a trainer 2x/week.   PERTINENT HISTORY:  See PMH above  PAIN:  Are you having pain? Yes:  NPRS scale: 0/10 currently, 7-8/10 at worst Pain location: Left side of neck Pain description: Sharp, tight Aggravating factors: Sitting, turning neck to left, golf Relieving factors: Medication  NPRS scale: 4-5/10 Pain location: Right hip Pain description: Achy Aggravating factors: Standing, walking,  Relieving factors: Medication  PRECAUTIONS: None  PATIENT GOALS: Get rid of pain, learn how to manage pain and exercises at home, golf without limitation   OBJECTIVE:  Note: Objective measures were completed at Evaluation unless otherwise noted. PATIENT SURVEYS:  PSFS: 5.33 Sitting extended periods: 6 Turning neck to left: 4 Golf: 6  POSTURE:    Mild rounded shoulder and forward head posture  PALPATION: Tender to palpation left upper trap region   Hypomobility noted of the cervical and thoracic spine  CERVICAL ROM:    Active ROM A/PROM (deg) eval   05/30/2024  Flexion 35   Extension 45   Right lateral flexion 25   Left lateral flexion 30   Right rotation 70   Left rotation 55 62   (Blank rows = not tested)  UPPER EXTREMITY ROM:  Active ROM Right eval Left eval  Shoulder flexion 150 150  Shoulder extension    Shoulder abduction    Shoulder adduction    Shoulder extension    Shoulder internal rotation    Shoulder external rotation    Elbow flexion    Elbow extension    Wrist flexion    Wrist extension    Wrist ulnar deviation    Wrist radial deviation  Wrist pronation    Wrist supination     (Blank rows = not tested)  UPPER EXTREMITY MMT:  MMT Right eval Left eval  Shoulder flexion 5 5  Shoulder extension 5 4+  Shoulder abduction 5 5  Shoulder adduction    Shoulder extension    Shoulder internal rotation 5 5  Shoulder external rotation 5 4+  Middle trapezius 4 4-  Lower trapezius    Elbow flexion    Elbow extension    Wrist flexion    Wrist extension    Wrist ulnar deviation    Wrist radial deviation    Wrist pronation    Wrist supination    Grip strength     (Blank rows = not tested)  CERVICAL SPECIAL TESTS:  Cervical radicular testing negative  LOWER EXTREMITY MMT:    MMT Right eval Left eval Rt / Lt 06/13/2024  Hip flexion     Hip extension   4- / 4-  Hip abduction   4- / 4  Hip adduction     Hip internal rotation     Hip external rotation     Knee flexion     Knee extension     Ankle dorsiflexion     Ankle plantarflexion     Ankle inversion     Ankle eversion      (Blank rows = not tested)   FUNCTIONAL TESTS:  DNF endurance: 7 seconds   TREATMENT [facility] Adult PT Treatment:                                                DATE: 06/27/2024 Hooklying glute set Hooklying clamshell with black 10 x 10 sec SL bridge 10 x 3 sec Standing hip abduction isometric at wall 5 x 20 sec each   PATIENT EDUCATION:  Education details: HEP update Person  educated: Patient Education method: Explanation, Demonstration, Tactile cues, Verbal cues, and Handouts Education comprehension: verbalized understanding, returned demonstration, verbal cues required, tactile cues required, and needs further education  HOME EXERCISE PROGRAM: Access Code: [email or web address]   ASSESSMENT: CLINICAL IMPRESSION: Patient tolerated therapy well with no adverse effects. She arrived late to therapy due to getting a CSI injection in the right glute med region. She reports the right hip has still been painful. Therapy continued focus on improving gluteal activation and strengthening with good tolerance. Incorporated more isometric glute med exercises and updated her HEP to include standing glute med isometric at wall. Patient would benefit from continued skilled PT to progress mobility and strength in order to reduce pain and maximize functional ability.   Eval: Patient is a 73 y.o. female who was seen today for physical therapy evaluation and treatment for chronic neck and right hip pain. Primarily focused on assessing neck this visit. She does exhibit some spinal mobility deficits and muscular tightness that is impacting her range of motion. She also demonstrates postural deviations and DNF endurance impairment. She does have some residual strength deficit of the left shoulder that may be contributing to her symptoms.    OBJECTIVE IMPAIRMENTS: decreased activity tolerance, decreased ROM, decreased strength, impaired flexibility, postural dysfunction, and pain.   ACTIVITY LIMITATIONS: lifting and sitting  PARTICIPATION LIMITATIONS: community activity  PERSONAL FACTORS: Past/current experiences and Time since onset of injury/illness/exacerbation are also affecting patient's functional outcome.    GOALS: Goals reviewed with patient? Yes  SHORT TERM GOALS: Target date: 06/21/2024  Patient will be I with initial HEP in order to progress with therapy. Baseline: HEP provided at  eval Goal status: INITIAL  2.  Patient will demonstrate left cervical rotation >/= 70 deg in order to reduce neck pain and limitation with golf Baseline: 55 deg Goal status: INITIAL  LONG TERM GOALS: Target date: 07/19/2024  Patient will be I with final HEP to maintain progress from PT. Baseline: HEP provided at eval Goal status: INITIAL  2.  Patient will report PSFS >/= 8 in order to indicate improvement in their functional ability. Baseline: 5.33 Goal status: INITIAL  3.  Patient will demonstrate DNF endurance >/= 30 seconds in order to improve postural control and reduce neck pain with sitting Baseline:  Goal status: INITIAL  4.  Patient will report neck pain </= 2/10 with all activity and cervical motion in order to reduce functional limitations Baseline: 7-8/10 pain at worse Goal status: INITIAL   PLAN: PT FREQUENCY: 1x/week  PT DURATION: 8 weeks  PLANNED INTERVENTIONS: 97164- PT Re-evaluation, 97750- Physical Performance Testing, 97110-Therapeutic exercises, 97530- Therapeutic activity, 97112- Neuromuscular re-education, 97535- Self Care, 02859- Manual therapy, 20560 (1-2 muscles), 20561 (3+ muscles)- Dry Needling, Patient/Family education, Taping, Joint mobilization, Joint manipulation, Spinal manipulation, Spinal mobilization, Cryotherapy, and Moist heat  PLAN FOR NEXT SESSION: Review HEP and progress PRN, manual/TPDN for cervical and left upper trap region, progress cervical and thoracic mobility, postural strengthening and DNF endurance; further assessment and treatment of right hip as indicated   Elaine Daring, PT, DPT, LAT, ATC 06/27/24  4:08 PM Phone: [phone] Fax: [phone]

## 2024-06-27 NOTE — Patient Instructions (Signed)
 Injected hip today. Follow up in 4 to 6 weeks.

## 2024-06-30 ENCOUNTER — Other Ambulatory Visit: Payer: Self-pay

## 2024-06-30 ENCOUNTER — Ambulatory Visit: Admitting: Physical Therapy

## 2024-06-30 ENCOUNTER — Encounter: Payer: Self-pay | Admitting: Physical Therapy

## 2024-06-30 DIAGNOSIS — M25551 Pain in right hip: Secondary | ICD-10-CM

## 2024-06-30 DIAGNOSIS — M6281 Muscle weakness (generalized): Secondary | ICD-10-CM | POA: Diagnosis not present

## 2024-06-30 DIAGNOSIS — M542 Cervicalgia: Secondary | ICD-10-CM | POA: Diagnosis not present

## 2024-06-30 NOTE — Patient Instructions (Signed)
 Access Code: [email or web address] URL: [email or web address] Date: 06/30/2024 Prepared by: Elaine Daring  Exercises - Supine Cervical Retraction with Towel  - 1 x daily - 2 sets - 10 reps - 5 seconds hold - Sidelying Thoracic Lumbar Rotation  - 1 x daily - 2 sets - 10 reps - Seated Assisted Cervical Rotation with Towel  - 1 x daily - 2 sets - 10 reps - Clamshell  - 1 x daily - 2 sets - 15 reps - Bridge with Resistance  - 1 x daily - 2 sets - 10 reps - 5 seconds hold - Standing Anti-Rotation Press with Anchored Resistance  - 1 x daily - 2 sets - 10 reps - 5 seconds hold - Side Stepping with Resistance at Thighs  - 1 x daily - 2 sets - 20 reps - Single Leg Stance  - 1 x daily - 3 reps - 30 seconds hold - Standing Isometric Hip Abduction with Knee at 90 at Wall  - 1 x daily - 5 reps - 20 seconds hold - Kettlebell Deadlift  - 1 x daily - 3 sets - 8 reps

## 2024-06-30 NOTE — Therapy (Signed)
 OUTPATIENT PHYSICAL THERAPY TREATMENT   Patient Name: Sabrina Tapia MRN: [id number] DOB:06-Apr-1951, 73 y.o., female Today's Date: 07/01/2024   END OF SESSION:  PT End of Session - 06/30/24 1518     Visit Number 6    Number of Visits 9    Date for Recertification  07/19/24    Authorization Type [employer] MCR    Authorization Time Period 05/30/2024 - 08/29/2023    Authorization - Visit Number 5    Authorization - Number of Visits 8    Progress Note Due on Visit 10    PT Start Time 1517    PT Stop Time 1600    PT Time Calculation (min) 43 min    Activity Tolerance Patient tolerated treatment well    Behavior During Therapy WFL for tasks assessed/performed               Past Medical History:  Diagnosis Date   Allergy-induced asthma    prn inhaler   Arterial calcification    sees pcp for takes atorvastatin  for   CAD (coronary artery disease)    Constipation    Emphysema of lung (HCC)    Endometrial thickening on ultrasound    Environmental and seasonal allergies    Family history of adverse reaction to anesthesia    pt's father has hx. of being hard to wake up post-op x 1 time after 8 hour whipple surgery   GERD (gastroesophageal reflux disease)    Hemorrhoids    Hisory of Migraines    History of basal cell carcinoma (BCC) excision    right clavicle   History of breast cancer 2017   bilateral   History of radiation therapy    08/19/16- 09/29/16, Right Breast 50 Gy in 25 fractions, Right Breast boost 10 Gy in 5 fractions, Left Breast 50.4 Gy in 28 fractions   Hypertension    OSA (obstructive sleep apnea)    uses dental appliance mild /moderate osa   Osteoarthritis    hands and feet   Osteopenia 02/2016   T score -1.3 FRAX 8.3%/0.7% stable from prior DEXA   Overactive bladder    Stroke (HCC)    Thyroid  nodule 2012   Solitary nodule in the lower pole of the left thyroid  lobe , pt unaware   Wears glasses for reading    Wears hearing aid in both ears    Past  Surgical History:  Procedure Laterality Date   BREAST RECONSTRUCTION WITH PLACEMENT OF TISSUE EXPANDER AND FLEX HD (ACELLULAR HYDRATED DERMIS) Left 07/01/2016   Procedure: BREAST RECONSTRUCTION WITH PLACEMENT OF TISSUE EXPANDER AND  ALLODERM;  Surgeon: Earlis Ranks, MD;  Location: [facility];  Service: Plastics;  Laterality: Left;   BROW LIFT     COLONOSCOPY  2022   DILATATION & CURETTAGE/HYSTEROSCOPY WITH MYOSURE N/A 12/06/2018   Procedure: DILATATION & CURETTAGE/HYSTEROSCOPY WITH MYOSURE;  Surgeon: Cathlyn JAYSON Nikki Bobie FORBES, MD;  Location: [facility];  Service: Gynecology;  Laterality: N/A;  follow 1st case   DILATATION & CURETTAGE/HYSTEROSCOPY WITH MYOSURE N/A 04/21/2022   Procedure: DILATATION & CURETTAGE/HYSTEROSCOPY;  Surgeon: Cathlyn JAYSON Nikki Bobie FORBES, MD;  Location: [facility];  Service: Gynecology;  Laterality: N/A;   INCISION AND DRAINAGE Left 06/02/2016   Procedure: DRAINAGE of left axilla seroma;  Surgeon: Donnice Bury, MD;  Location:  SURGERY CENTER;  Service: General;  Laterality: Left;   LIPOSUCTION WITH LIPOFILLING Left 06/11/2018   Procedure: Lipofilling from abdomen to left  chest;  Surgeon: Arelia Filippo, MD;  Location: [facility];  Service: Plastics;  Laterality: Left;   MASTOPEXY Right 04/17/2017   Procedure: RIGHT MASTOPEXY;  Surgeon: Arelia Filippo, MD;  Location: [facility];  Service: Plastics;  Laterality: Right;   NIPPLE SPARING MASTECTOMY Left 07/01/2016   Procedure: LEFT NIPPLE SPARING MASTECTOMY;  Surgeon: Donnice Bury, MD;  Location: [facility];  Service: General;  Laterality: Left;   OPERATIVE ULTRASOUND N/A 04/21/2022   Procedure: OPERATIVE ULTRASOUND;  Surgeon: Cathlyn JAYSON Nikki Bobie FORBES, MD;  Location: [facility];  Service: Gynecology;  Laterality: N/A;   RADIOACTIVE SEED GUIDED PARTIAL MASTECTOMY WITH AXILLARY SENTINEL LYMPH  NODE BIOPSY Right 03/26/2016   Procedure: RIGHT BREAST LUMPECTOMY WITH RADIOACTIVE SEED AND SENTINEL LYMPH NODE BIOPSY;  Surgeon: Donnice Bury, MD;  Location: [facility];  Service: General;  Laterality: Right;   RADIOACTIVE SEED GUIDED PARTIAL MASTECTOMY WITH AXILLARY SENTINEL LYMPH NODE BIOPSY Left 05/20/2016   Procedure: LEFT BREAST RADIOACTIVE SEED (two seeds) GUIDED LUMPECTOMY WITH LEFT AXILLARY SENTINEL LYMPH NODE BIOPSY AND LEFT SEED TARGETED AXILLARY LYMPH NODE EXCISION;  Surgeon: Donnice Bury, MD;  Location: [facility];  Service: General;  Laterality: Left;   RE-EXCISION OF BREAST CANCER,SUPERIOR MARGINS Right 05/20/2016   Procedure: RE-EXCISION OF RIGHT BREAST MARGIN;  Surgeon: Donnice Bury, MD;  Location: [facility];  Service: General;  Laterality: Right;   RE-EXCISION OF BREAST CANCER,SUPERIOR MARGINS Left 06/02/2016   Procedure: RE-EXCISION OF BREAST CANCER,SUPERIOR MARGINS;  Surgeon: Donnice Bury, MD;  Location: [facility];  Service: General;  Laterality: Left;   REMOVAL OF TISSUE EXPANDER AND PLACEMENT OF IMPLANT Left 04/17/2017   Procedure: REMOVAL OF LEFT TISSUE EXPANDER WITH PLACEMENT OF LEFT SILICONE BREAST IMPLANT;  Surgeon: Arelia Filippo, MD;  Location: [facility];  Service: Plastics;  Laterality: Left;   ROBOTIC ASSISTED LAPAROSCOPIC LYSIS OF ADHESION N/A 06/26/2022   Procedure: XI ROBOTIC ASSISTED LAPAROSCOPIC LYSIS OF ADHESION;  Surgeon: Viktoria Comer SAUNDERS, MD;  Location: [facility];  Service: Gynecology;  Laterality: N/A;   ROBOTIC ASSISTED TOTAL HYSTERECTOMY WITH BILATERAL SALPINGO OOPHERECTOMY N/A 06/26/2022   Procedure: XI ROBOTIC ASSISTED TOTAL HYSTERECTOMY WITH BILATERAL SALPINGO OOPHORECTOMY, MINI LAPAROTOMY;  Surgeon: Viktoria Comer SAUNDERS, MD;  Location: [facility];  Service: Gynecology;  Laterality: N/A;   shoulder surgery for arthritis Left 07/2021   arthroscopic done at surgical  center of    Patient Active Problem List   Diagnosis Date Noted   Pelvic adhesions 06/26/2022   Degenerative tear of medial meniscus 01/23/2022   Greater trochanteric bursitis of right hip 06/11/2021   Arthritis of carpometacarpal [facility]) joint of left thumb 06/11/2021   Mass of right lung 07/13/2020   Arthritis 08/03/2019   Rosacea 08/03/2019   Pes anserinus bursitis of right knee 03/28/2019   Pain in right knee 03/22/2019   Thickened endometrium 10/24/2018   Adhesive capsulitis of right shoulder 10/18/2018   Pain in right hand 10/05/2018   Tear of biceps tendon 09/21/2018   Moderate persistent asthma, uncomplicated 06/29/2017   Hallux rigidus of right foot 06/15/2017   Left Achilles tendinitis 06/15/2017   History of bilateral breast cancer 04/23/2017   Mild persistent asthma, uncomplicated 02/12/2017   Other seasonal allergic rhinitis 02/12/2017   History of therapeutic radiation 02/11/2017   Malignant neoplasm of overlapping sites of left breast in female, estrogen receptor positive (HCC) 10/27/2016   Sinusitis 09/01/2016   Hyponatremia 09/01/2016   Acquired  absence of left breast 07/16/2016   Malignant neoplasm of upper-outer quadrant of right breast in female, estrogen receptor positive (HCC) 04/19/2016   Dizziness after extension of neck 02/19/2016   Sensorineural hearing loss (SNHL), bilateral 02/19/2016   Tinnitus of both ears 02/19/2016   Bunion of great toe of right foot 04/04/2015   Hemorrhoid 11/23/2014   Anal skin tag 11/23/2014   Incontinence of urine 07/28/2014   Retrocalcaneal bursitis 05/11/2014   Pain in joint, ankle and foot 05/11/2014   Heart burn    Joint pain    Abnormal Pap smear    Osteopenia     PCP: Charlott Dorn LABOR, MD  REFERRING PROVIDER: Leonce Katz, DO  REFERRING DIAG: Cervicalgia; Pain of right hip  THERAPY DIAG:  Pain in right hip  Cervicalgia  Muscle weakness (generalized)  Rationale for Evaluation and  Treatment: Rehabilitation  ONSET DATE: Chronic, approximately 3 months   SUBJECTIVE SUBJECTIVE STATEMENT: Patient reports she has been feeling better. Her neck is not bothering her and the right hip is better, but she can still feel it with her exercises.   Eval: Patient reports neck pain and tightening of her shoulders for close to 3 months with gradual worsening of pain. Occasionally she turns it and feels/hears this scraping and popping. She states she currently feels discomfort in the left side of the neck and the longer she sits the more difficulty it gets, also turning her neck to the left. She denies any pain into the shoulder or arm. She plays a lot of golf and is uncomfortable after she plays. She did have a left arthroscopic shoulder surgery 3-4 years ago and mastectomy due to breast cancer that may have contributed to worsening posture. She also reports some right hip pain on the outside of the hip, and she did get a shot at her last appointment which seemed to help. She does see a trainer 2x/week.   PERTINENT HISTORY:  See PMH above  PAIN:  Are you having pain? Yes:  NPRS scale: 0/10 currently, 7-8/10 at worst Pain location: Left side of neck Pain description: Sharp, tight Aggravating factors: Sitting, turning neck to left, golf Relieving factors: Medication  NPRS scale: 4-5/10 Pain location: Right hip Pain description: Achy Aggravating factors: Standing, walking,  Relieving factors: Medication  PRECAUTIONS: None  PATIENT GOALS: Get rid of pain, learn how to manage pain and exercises at home, golf without limitation   OBJECTIVE:  Note: Objective measures were completed at Evaluation unless otherwise noted. PATIENT SURVEYS:  PSFS: 5.33 Sitting extended periods: 6 Turning neck to left: 4 Golf: 6  POSTURE:    Mild rounded shoulder and forward head posture  PALPATION: Tender to palpation left upper trap region   Hypomobility noted of the cervical and thoracic  spine  CERVICAL ROM:   Active ROM A/PROM (deg) eval   05/30/2024  Flexion 35   Extension 45   Right lateral flexion 25   Left lateral flexion 30   Right rotation 70   Left rotation 55 62   (Blank rows = not tested)  UPPER EXTREMITY ROM:  Active ROM Right eval Left eval  Shoulder flexion 150 150  Shoulder extension    Shoulder abduction    Shoulder adduction    Shoulder extension    Shoulder internal rotation    Shoulder external rotation    Elbow flexion    Elbow extension    Wrist flexion    Wrist extension    Wrist ulnar deviation  Wrist radial deviation    Wrist pronation    Wrist supination     (Blank rows = not tested)  UPPER EXTREMITY MMT:  MMT Right eval Left eval  Shoulder flexion 5 5  Shoulder extension 5 4+  Shoulder abduction 5 5  Shoulder adduction    Shoulder extension    Shoulder internal rotation 5 5  Shoulder external rotation 5 4+  Middle trapezius 4 4-  Lower trapezius    Elbow flexion    Elbow extension    Wrist flexion    Wrist extension    Wrist ulnar deviation    Wrist radial deviation    Wrist pronation    Wrist supination    Grip strength     (Blank rows = not tested)  CERVICAL SPECIAL TESTS:  Cervical radicular testing negative  LOWER EXTREMITY MMT:    MMT Right eval Left eval Rt / Lt 06/13/2024  Hip flexion     Hip extension   4- / 4-  Hip abduction   4- / 4  Hip adduction     Hip internal rotation     Hip external rotation     Knee flexion     Knee extension     Ankle dorsiflexion     Ankle plantarflexion     Ankle inversion     Ankle eversion      (Blank rows = not tested)   FUNCTIONAL TESTS:  DNF endurance: 7 seconds   TREATMENT [facility] Adult PT Treatment:                                                DATE: 06/30/2024 Standing hip abduction isometric at wall 3 x 20 sec each Lateral band walk with green at knees 2 x 20 down/back Split stance with trunk rotation holding 12# med ball 2 x 5  each Split stance pallof press with L2 powerband 2 x 10 each Deadlift with 30# 3 x 8 Split stance low row with trunk rotation 2 x 10 each   PATIENT EDUCATION:  Education details: HEP update Person educated: Patient Education method: Explanation, Demonstration, Tactile cues, Verbal cues, and Handouts Education comprehension: verbalized understanding, returned demonstration, verbal cues required, tactile cues required, and needs further education  HOME EXERCISE PROGRAM: Access Code: [email or web address]   ASSESSMENT: CLINICAL IMPRESSION: Patient tolerated therapy well with no adverse effects. Therapy focused on hip and core strengthening, and improving stability and control at the hip/pelvis with trunk rotation with good tolerance. She does report greater difficulty and feeling the right hip more with the exercises but denies specific increase in lateral hip pain. She does require consistent cueing to maintain stable pelvis with exercises and occasional tactile cueing at hips. Incorporated lifting with good technique and updated HEP to include lifting at gym. Patient would benefit from continued skilled PT to progress mobility and strength in order to reduce pain and maximize functional ability.   Eval: Patient is a 73 y.o. female who was seen today for physical therapy evaluation and treatment for chronic neck and right hip pain. Primarily focused on assessing neck this visit. She does exhibit some spinal mobility deficits and muscular tightness that is impacting her range of motion. She also demonstrates postural deviations and DNF endurance impairment. She does have some residual strength deficit of the left shoulder that may be contributing to her  symptoms.    OBJECTIVE IMPAIRMENTS: decreased activity tolerance, decreased ROM, decreased strength, impaired flexibility, postural dysfunction, and pain.   ACTIVITY LIMITATIONS: lifting and sitting  PARTICIPATION LIMITATIONS: community  activity  PERSONAL FACTORS: Past/current experiences and Time since onset of injury/illness/exacerbation are also affecting patient's functional outcome.    GOALS: Goals reviewed with patient? Yes  SHORT TERM GOALS: Target date: 06/21/2024  Patient will be I with initial HEP in order to progress with therapy. Baseline: HEP provided at eval 07/01/2024: independent Goal status: MET  2.  Patient will demonstrate left cervical rotation >/= 70 deg in order to reduce neck pain and limitation with golf Baseline: 55 deg 07/01/2024: not recently assessed Goal status: ONGOING  LONG TERM GOALS: Target date: 07/19/2024  Patient will be I with final HEP to maintain progress from PT. Baseline: HEP provided at eval Goal status: INITIAL  2.  Patient will report PSFS >/= 8 in order to indicate improvement in their functional ability. Baseline: 5.33 Goal status: INITIAL  3.  Patient will demonstrate DNF endurance >/= 30 seconds in order to improve postural control and reduce neck pain with sitting Baseline:  Goal status: INITIAL  4.  Patient will report neck pain </= 2/10 with all activity and cervical motion in order to reduce functional limitations Baseline: 7-8/10 pain at worse Goal status: INITIAL   PLAN: PT FREQUENCY: 1x/week  PT DURATION: 8 weeks  PLANNED INTERVENTIONS: 97164- PT Re-evaluation, 97750- Physical Performance Testing, 97110-Therapeutic exercises, 97530- Therapeutic activity, 97112- Neuromuscular re-education, 97535- Self Care, 02859- Manual therapy, 20560 (1-2 muscles), 20561 (3+ muscles)- Dry Needling, Patient/Family education, Taping, Joint mobilization, Joint manipulation, Spinal manipulation, Spinal mobilization, Cryotherapy, and Moist heat  PLAN FOR NEXT SESSION: Review HEP and progress PRN, continue progression of right hip strengthening and stability, manual/TPDN for cervical and left upper trap region, progress cervical and thoracic mobility, postural  strengthening and DNF endurance   Elaine Daring, PT, DPT, LAT, ATC 07/01/24  8:02 AM Phone: [phone] Fax: [phone]

## 2024-07-04 ENCOUNTER — Ambulatory Visit: Payer: Self-pay | Admitting: Sports Medicine

## 2024-07-04 ENCOUNTER — Ambulatory Visit: Admitting: Physical Therapy

## 2024-07-04 ENCOUNTER — Encounter: Payer: Self-pay | Admitting: Physical Therapy

## 2024-07-04 ENCOUNTER — Other Ambulatory Visit: Payer: Self-pay

## 2024-07-04 DIAGNOSIS — M25551 Pain in right hip: Secondary | ICD-10-CM | POA: Diagnosis not present

## 2024-07-04 DIAGNOSIS — M542 Cervicalgia: Secondary | ICD-10-CM | POA: Diagnosis not present

## 2024-07-04 DIAGNOSIS — M6281 Muscle weakness (generalized): Secondary | ICD-10-CM

## 2024-07-04 NOTE — Therapy (Signed)
 OUTPATIENT PHYSICAL THERAPY TREATMENT   Patient Name: Sabrina Tapia MRN: [id number] DOB:1950/11/18, 73 y.o., female Today's Date: 07/04/2024   END OF SESSION:  PT End of Session - 07/04/24 1322     Visit Number 7    Number of Visits 9    Date for Recertification  07/19/24    Authorization Type [employer] MCR    Authorization Time Period 05/30/2024 - 08/29/2023    Authorization - Visit Number 6    Authorization - Number of Visits 8    Progress Note Due on Visit 10    PT Start Time 1300    PT Stop Time 1345    PT Time Calculation (min) 45 min    Activity Tolerance Patient tolerated treatment well    Behavior During Therapy WFL for tasks assessed/performed                Past Medical History:  Diagnosis Date   Allergy-induced asthma    prn inhaler   Arterial calcification    sees pcp for takes atorvastatin  for   CAD (coronary artery disease)    Constipation    Emphysema of lung (HCC)    Endometrial thickening on ultrasound    Environmental and seasonal allergies    Family history of adverse reaction to anesthesia    pt's father has hx. of being hard to wake up post-op x 1 time after 8 hour whipple surgery   GERD (gastroesophageal reflux disease)    Hemorrhoids    Hisory of Migraines    History of basal cell carcinoma (BCC) excision    right clavicle   History of breast cancer 2017   bilateral   History of radiation therapy    08/19/16- 09/29/16, Right Breast 50 Gy in 25 fractions, Right Breast boost 10 Gy in 5 fractions, Left Breast 50.4 Gy in 28 fractions   Hypertension    OSA (obstructive sleep apnea)    uses dental appliance mild /moderate osa   Osteoarthritis    hands and feet   Osteopenia 02/2016   T score -1.3 FRAX 8.3%/0.7% stable from prior DEXA   Overactive bladder    Stroke (HCC)    Thyroid  nodule 2012   Solitary nodule in the lower pole of the left thyroid  lobe , pt unaware   Wears glasses for reading    Wears hearing aid in both ears    Past  Surgical History:  Procedure Laterality Date   BREAST RECONSTRUCTION WITH PLACEMENT OF TISSUE EXPANDER AND FLEX HD (ACELLULAR HYDRATED DERMIS) Left 07/01/2016   Procedure: BREAST RECONSTRUCTION WITH PLACEMENT OF TISSUE EXPANDER AND  ALLODERM;  Surgeon: Earlis Ranks, MD;  Location: [facility];  Service: Plastics;  Laterality: Left;   BROW LIFT     COLONOSCOPY  2022   DILATATION & CURETTAGE/HYSTEROSCOPY WITH MYOSURE N/A 12/06/2018   Procedure: DILATATION & CURETTAGE/HYSTEROSCOPY WITH MYOSURE;  Surgeon: Cathlyn JAYSON Nikki Bobie FORBES, MD;  Location: [facility];  Service: Gynecology;  Laterality: N/A;  follow 1st case   DILATATION & CURETTAGE/HYSTEROSCOPY WITH MYOSURE N/A 04/21/2022   Procedure: DILATATION & CURETTAGE/HYSTEROSCOPY;  Surgeon: Cathlyn JAYSON Nikki Bobie FORBES, MD;  Location: [facility];  Service: Gynecology;  Laterality: N/A;   INCISION AND DRAINAGE Left 06/02/2016   Procedure: DRAINAGE of left axilla seroma;  Surgeon: Donnice Bury, MD;  Location: [facility];  Service: General;  Laterality: Left;   LIPOSUCTION WITH LIPOFILLING Left 06/11/2018   Procedure: Lipofilling from abdomen to  left chest;  Surgeon: Arelia Filippo, MD;  Location: [facility];  Service: Plastics;  Laterality: Left;   MASTOPEXY Right 04/17/2017   Procedure: RIGHT MASTOPEXY;  Surgeon: Arelia Filippo, MD;  Location: [facility];  Service: Plastics;  Laterality: Right;   NIPPLE SPARING MASTECTOMY Left 07/01/2016   Procedure: LEFT NIPPLE SPARING MASTECTOMY;  Surgeon: Donnice Bury, MD;  Location: [facility];  Service: General;  Laterality: Left;   OPERATIVE ULTRASOUND N/A 04/21/2022   Procedure: OPERATIVE ULTRASOUND;  Surgeon: Cathlyn JAYSON Nikki Bobie FORBES, MD;  Location: [facility];  Service: Gynecology;  Laterality: N/A;   RADIOACTIVE SEED GUIDED PARTIAL MASTECTOMY WITH AXILLARY SENTINEL LYMPH  NODE BIOPSY Right 03/26/2016   Procedure: RIGHT BREAST LUMPECTOMY WITH RADIOACTIVE SEED AND SENTINEL LYMPH NODE BIOPSY;  Surgeon: Donnice Bury, MD;  Location: [facility];  Service: General;  Laterality: Right;   RADIOACTIVE SEED GUIDED PARTIAL MASTECTOMY WITH AXILLARY SENTINEL LYMPH NODE BIOPSY Left 05/20/2016   Procedure: LEFT BREAST RADIOACTIVE SEED (two seeds) GUIDED LUMPECTOMY WITH LEFT AXILLARY SENTINEL LYMPH NODE BIOPSY AND LEFT SEED TARGETED AXILLARY LYMPH NODE EXCISION;  Surgeon: Donnice Bury, MD;  Location: [facility];  Service: General;  Laterality: Left;   RE-EXCISION OF BREAST CANCER,SUPERIOR MARGINS Right 05/20/2016   Procedure: RE-EXCISION OF RIGHT BREAST MARGIN;  Surgeon: Donnice Bury, MD;  Location: [facility];  Service: General;  Laterality: Right;   RE-EXCISION OF BREAST CANCER,SUPERIOR MARGINS Left 06/02/2016   Procedure: RE-EXCISION OF BREAST CANCER,SUPERIOR MARGINS;  Surgeon: Donnice Bury, MD;  Location: [facility];  Service: General;  Laterality: Left;   REMOVAL OF TISSUE EXPANDER AND PLACEMENT OF IMPLANT Left 04/17/2017   Procedure: REMOVAL OF LEFT TISSUE EXPANDER WITH PLACEMENT OF LEFT SILICONE BREAST IMPLANT;  Surgeon: Arelia Filippo, MD;  Location: [facility];  Service: Plastics;  Laterality: Left;   ROBOTIC ASSISTED LAPAROSCOPIC LYSIS OF ADHESION N/A 06/26/2022   Procedure: XI ROBOTIC ASSISTED LAPAROSCOPIC LYSIS OF ADHESION;  Surgeon: Viktoria Comer SAUNDERS, MD;  Location: [facility];  Service: Gynecology;  Laterality: N/A;   ROBOTIC ASSISTED TOTAL HYSTERECTOMY WITH BILATERAL SALPINGO OOPHERECTOMY N/A 06/26/2022   Procedure: XI ROBOTIC ASSISTED TOTAL HYSTERECTOMY WITH BILATERAL SALPINGO OOPHORECTOMY, MINI LAPAROTOMY;  Surgeon: Viktoria Comer SAUNDERS, MD;  Location: [facility];  Service: Gynecology;  Laterality: N/A;   shoulder surgery for arthritis Left 07/2021   arthroscopic done at surgical  center of [place]   Patient Active Problem List   Diagnosis Date Noted   Pelvic adhesions 06/26/2022   Degenerative tear of medial meniscus 01/23/2022   Greater trochanteric bursitis of right hip 06/11/2021   Arthritis of carpometacarpal [facility]) joint of left thumb 06/11/2021   Mass of right lung 07/13/2020   Arthritis 08/03/2019   Rosacea 08/03/2019   Pes anserinus bursitis of right knee 03/28/2019   Pain in right knee 03/22/2019   Thickened endometrium 10/24/2018   Adhesive capsulitis of right shoulder 10/18/2018   Pain in right hand 10/05/2018   Tear of biceps tendon 09/21/2018   Moderate persistent asthma, uncomplicated 06/29/2017   Hallux rigidus of right foot 06/15/2017   Left Achilles tendinitis 06/15/2017   History of bilateral breast cancer 04/23/2017   Mild persistent asthma, uncomplicated 02/12/2017   Other seasonal allergic rhinitis 02/12/2017   History of therapeutic radiation 02/11/2017   Malignant neoplasm of overlapping sites of left breast in female, estrogen receptor positive (HCC) 10/27/2016   Sinusitis 09/01/2016   Hyponatremia 09/01/2016  Acquired absence of left breast 07/16/2016   Malignant neoplasm of upper-outer quadrant of right breast in female, estrogen receptor positive (HCC) 04/19/2016   Dizziness after extension of neck 02/19/2016   Sensorineural hearing loss (SNHL), bilateral 02/19/2016   Tinnitus of both ears 02/19/2016   Bunion of great toe of right foot 04/04/2015   Hemorrhoid 11/23/2014   Anal skin tag 11/23/2014   Incontinence of urine 07/28/2014   Retrocalcaneal bursitis 05/11/2014   Pain in joint, ankle and foot 05/11/2014   Heart burn    Joint pain    Abnormal Pap smear    Osteopenia     PCP: Charlott Dorn LABOR, MD  REFERRING PROVIDER: Leonce Katz, DO  REFERRING DIAG: Cervicalgia; Pain of right hip  THERAPY DIAG:  Pain in right hip  Cervicalgia  Muscle weakness (generalized)  Rationale for Evaluation and  Treatment: Rehabilitation  ONSET DATE: Chronic, approximately 3 months   SUBJECTIVE SUBJECTIVE STATEMENT: Patient reports she is doing good, no new issues.  Eval: Patient reports neck pain and tightening of her shoulders for close to 3 months with gradual worsening of pain. Occasionally she turns it and feels/hears this scraping and popping. She states she currently feels discomfort in the left side of the neck and the longer she sits the more difficulty it gets, also turning her neck to the left. She denies any pain into the shoulder or arm. She plays a lot of golf and is uncomfortable after she plays. She did have a left arthroscopic shoulder surgery 3-4 years ago and mastectomy due to breast cancer that may have contributed to worsening posture. She also reports some right hip pain on the outside of the hip, and she did get a shot at her last appointment which seemed to help. She does see a trainer 2x/week.   PERTINENT HISTORY:  See PMH above  PAIN:  Are you having pain? Yes:  NPRS scale: 0/10 currently, 7-8/10 at worst Pain location: Left side of neck Pain description: Sharp, tight Aggravating factors: Sitting, turning neck to left, golf Relieving factors: Medication  NPRS scale: 1/10 Pain location: Right hip Pain description: Achy Aggravating factors: Standing, walking,  Relieving factors: Medication  PRECAUTIONS: None  PATIENT GOALS: Get rid of pain, learn how to manage pain and exercises at home, golf without limitation   OBJECTIVE:  Note: Objective measures were completed at Evaluation unless otherwise noted. PATIENT SURVEYS:  PSFS: 5.33 Sitting extended periods: 6 Turning neck to left: 4 Golf: 6  POSTURE:    Mild rounded shoulder and forward head posture  PALPATION: Tender to palpation left upper trap region   Hypomobility noted of the cervical and thoracic spine  CERVICAL ROM:   Active ROM A/PROM (deg) eval   05/30/2024  Flexion 35   Extension 45    Right lateral flexion 25   Left lateral flexion 30   Right rotation 70   Left rotation 55 62   (Blank rows = not tested)  UPPER EXTREMITY ROM:  Active ROM Right eval Left eval  Shoulder flexion 150 150  Shoulder extension    Shoulder abduction    Shoulder adduction    Shoulder extension    Shoulder internal rotation    Shoulder external rotation    Elbow flexion    Elbow extension    Wrist flexion    Wrist extension    Wrist ulnar deviation    Wrist radial deviation    Wrist pronation    Wrist supination     (  Blank rows = not tested)  UPPER EXTREMITY MMT:  MMT Right eval Left eval  Shoulder flexion 5 5  Shoulder extension 5 4+  Shoulder abduction 5 5  Shoulder adduction    Shoulder extension    Shoulder internal rotation 5 5  Shoulder external rotation 5 4+  Middle trapezius 4 4-  Lower trapezius    Elbow flexion    Elbow extension    Wrist flexion    Wrist extension    Wrist ulnar deviation    Wrist radial deviation    Wrist pronation    Wrist supination    Grip strength     (Blank rows = not tested)  CERVICAL SPECIAL TESTS:  Cervical radicular testing negative  LOWER EXTREMITY MMT:    MMT Right eval Left eval Rt / Lt 06/13/2024  Hip flexion     Hip extension   4- / 4-  Hip abduction   4- / 4  Hip adduction     Hip internal rotation     Hip external rotation     Knee flexion     Knee extension     Ankle dorsiflexion     Ankle plantarflexion     Ankle inversion     Ankle eversion      (Blank rows = not tested)   FUNCTIONAL TESTS:  DNF endurance: 7 seconds   TREATMENT [facility] Adult PT Treatment:                                                DATE: 07/04/2024 Bridge x 10 Marching bridge with lat hold using red 3 x 10 Modified side plank and clamshell with yellow 3 x 10 each Deadlift with 30# 3 x 10 Lateral lunge using slant board holding 10# in contralateral hand 2 x 10 each Banded resistance golf swing using L2 powerband x 10  each Split stance with trunk rotation holding 12# med ball x 10 each Med ball 12# rotational throws x 10 each  PATIENT EDUCATION:  Education details: HEP Person educated: Patient Education method: Explanation, Demonstration, Tactile cues, Verbal cues Education comprehension: verbalized understanding, returned demonstration, verbal cues required, tactile cues required, and needs further education  HOME EXERCISE PROGRAM: Access Code: [email or web address]   ASSESSMENT: CLINICAL IMPRESSION: Patient tolerated therapy well with no adverse effects. Therapy focused on hip and core strengthening, and improving stability and control at the hip/pelvis with trunk rotation with good tolerance. Progressed with her hip/core strengthening this visit and incorporated more medicine ball exercises for golf specific movements to improve loading of her hips. She did not report any increased pain with therapy this visit. No changes to her HEP this visit. Patient would benefit from continued skilled PT to progress mobility and strength in order to reduce pain and maximize functional ability.   Eval: Patient is a 73 y.o. female who was seen today for physical therapy evaluation and treatment for chronic neck and right hip pain. Primarily focused on assessing neck this visit. She does exhibit some spinal mobility deficits and muscular tightness that is impacting her range of motion. She also demonstrates postural deviations and DNF endurance impairment. She does have some residual strength deficit of the left shoulder that may be contributing to her symptoms.    OBJECTIVE IMPAIRMENTS: decreased activity tolerance, decreased ROM, decreased strength, impaired flexibility, postural dysfunction, and pain.  ACTIVITY LIMITATIONS: lifting and sitting  PARTICIPATION LIMITATIONS: community activity  PERSONAL FACTORS: Past/current experiences and Time since onset of injury/illness/exacerbation are also affecting patient's functional  outcome.    GOALS: Goals reviewed with patient? Yes  SHORT TERM GOALS: Target date: 06/21/2024  Patient will be I with initial HEP in order to progress with therapy. Baseline: HEP provided at eval 07/01/2024: independent Goal status: MET  2.  Patient will demonstrate left cervical rotation >/= 70 deg in order to reduce neck pain and limitation with golf Baseline: 55 deg 07/01/2024: not recently assessed Goal status: ONGOING  LONG TERM GOALS: Target date: 07/19/2024  Patient will be I with final HEP to maintain progress from PT. Baseline: HEP provided at eval Goal status: INITIAL  2.  Patient will report PSFS >/= 8 in order to indicate improvement in their functional ability. Baseline: 5.33 Goal status: INITIAL  3.  Patient will demonstrate DNF endurance >/= 30 seconds in order to improve postural control and reduce neck pain with sitting Baseline:  Goal status: INITIAL  4.  Patient will report neck pain </= 2/10 with all activity and cervical motion in order to reduce functional limitations Baseline: 7-8/10 pain at worse Goal status: INITIAL   PLAN: PT FREQUENCY: 1x/week  PT DURATION: 8 weeks  PLANNED INTERVENTIONS: 97164- PT Re-evaluation, 97750- Physical Performance Testing, 97110-Therapeutic exercises, 97530- Therapeutic activity, 97112- Neuromuscular re-education, 97535- Self Care, 02859- Manual therapy, 20560 (1-2 muscles), 20561 (3+ muscles)- Dry Needling, Patient/Family education, Taping, Joint mobilization, Joint manipulation, Spinal manipulation, Spinal mobilization, Cryotherapy, and Moist heat  PLAN FOR NEXT SESSION: Review HEP and progress PRN, continue progression of right hip strengthening and stability, manual/TPDN for cervical and left upper trap region, progress cervical and thoracic mobility, postural strengthening and DNF endurance   Elaine Daring, PT, DPT, LAT, ATC 07/04/24  1:55 PM Phone: [phone] Fax: [phone]

## 2024-07-07 ENCOUNTER — Encounter: Admitting: Physical Therapy

## 2024-07-08 ENCOUNTER — Telehealth: Payer: Self-pay

## 2024-07-08 NOTE — Telephone Encounter (Signed)
 Per md orders entered for [employer] and all supported documents were entered through the porter.

## 2024-07-11 ENCOUNTER — Ambulatory Visit: Admitting: Physical Therapy

## 2024-07-11 ENCOUNTER — Encounter: Payer: Self-pay | Admitting: Physical Therapy

## 2024-07-11 ENCOUNTER — Other Ambulatory Visit: Payer: Self-pay

## 2024-07-11 DIAGNOSIS — M6281 Muscle weakness (generalized): Secondary | ICD-10-CM

## 2024-07-11 DIAGNOSIS — M25551 Pain in right hip: Secondary | ICD-10-CM | POA: Diagnosis not present

## 2024-07-11 DIAGNOSIS — M542 Cervicalgia: Secondary | ICD-10-CM

## 2024-07-11 NOTE — Therapy (Signed)
 OUTPATIENT PHYSICAL THERAPY TREATMENT   Patient Name: Sabrina Tapia MRN: [id number] DOB:1950/12/09, 73 y.o., female Today's Date: 07/11/2024   END OF SESSION:  PT End of Session - 07/11/24 1109     Visit Number 8    Number of Visits 9    Date for Recertification  07/19/24    Authorization Type [employer] MCR    Authorization Time Period 05/30/2024 - 08/29/2023    Authorization - Visit Number 7    Authorization - Number of Visits 8    Progress Note Due on Visit 10    PT Start Time 1102    PT Stop Time 1145    PT Time Calculation (min) 43 min    Activity Tolerance Patient tolerated treatment well    Behavior During Therapy WFL for tasks assessed/performed                 Past Medical History:  Diagnosis Date   Allergy-induced asthma    prn inhaler   Arterial calcification    sees pcp for takes atorvastatin  for   CAD (coronary artery disease)    Constipation    Emphysema of lung (HCC)    Endometrial thickening on ultrasound    Environmental and seasonal allergies    Family history of adverse reaction to anesthesia    pt's father has hx. of being hard to wake up post-op x 1 time after 8 hour whipple surgery   GERD (gastroesophageal reflux disease)    Hemorrhoids    Hisory of Migraines    History of basal cell carcinoma (BCC) excision    right clavicle   History of breast cancer 2017   bilateral   History of radiation therapy    08/19/16- 09/29/16, Right Breast 50 Gy in 25 fractions, Right Breast boost 10 Gy in 5 fractions, Left Breast 50.4 Gy in 28 fractions   Hypertension    OSA (obstructive sleep apnea)    uses dental appliance mild /moderate osa   Osteoarthritis    hands and feet   Osteopenia 02/2016   T score -1.3 FRAX 8.3%/0.7% stable from prior DEXA   Overactive bladder    Stroke [facility])    Thyroid  nodule 2012   Solitary nodule in the lower pole of the left thyroid  lobe , pt unaware   Wears glasses for reading    Wears hearing aid in both ears     Past Surgical History:  Procedure Laterality Date   BREAST RECONSTRUCTION WITH PLACEMENT OF TISSUE EXPANDER AND FLEX HD (ACELLULAR HYDRATED DERMIS) Left 07/01/2016   Procedure: BREAST RECONSTRUCTION WITH PLACEMENT OF TISSUE EXPANDER AND  ALLODERM;  Surgeon: Earlis Ranks, MD;  Location: [facility];  Service: Plastics;  Laterality: Left;   BROW LIFT     COLONOSCOPY  2022   DILATATION & CURETTAGE/HYSTEROSCOPY WITH MYOSURE N/A 12/06/2018   Procedure: DILATATION & CURETTAGE/HYSTEROSCOPY WITH MYOSURE;  Surgeon: Cathlyn JAYSON Nikki Bobie FORBES, MD;  Location: [facility];  Service: Gynecology;  Laterality: N/A;  follow 1st case   DILATATION & CURETTAGE/HYSTEROSCOPY WITH MYOSURE N/A 04/21/2022   Procedure: DILATATION & CURETTAGE/HYSTEROSCOPY;  Surgeon: Cathlyn JAYSON Nikki Bobie FORBES, MD;  Location: [facility];  Service: Gynecology;  Laterality: N/A;   INCISION AND DRAINAGE Left 06/02/2016   Procedure: DRAINAGE of left axilla seroma;  Surgeon: Donnice Bury, MD;  Location: [facility];  Service: General;  Laterality: Left;   LIPOSUCTION WITH LIPOFILLING Left 06/11/2018   Procedure: Lipofilling from abdomen  to left chest;  Surgeon: Arelia Filippo, MD;  Location: [facility];  Service: Plastics;  Laterality: Left;   MASTOPEXY Right 04/17/2017   Procedure: RIGHT MASTOPEXY;  Surgeon: Arelia Filippo, MD;  Location: [facility];  Service: Plastics;  Laterality: Right;   NIPPLE SPARING MASTECTOMY Left 07/01/2016   Procedure: LEFT NIPPLE SPARING MASTECTOMY;  Surgeon: Donnice Bury, MD;  Location: [facility];  Service: General;  Laterality: Left;   OPERATIVE ULTRASOUND N/A 04/21/2022   Procedure: OPERATIVE ULTRASOUND;  Surgeon: Cathlyn JAYSON Nikki Bobie FORBES, MD;  Location: [facility];  Service: Gynecology;  Laterality: N/A;   RADIOACTIVE SEED GUIDED PARTIAL MASTECTOMY WITH AXILLARY SENTINEL  LYMPH NODE BIOPSY Right 03/26/2016   Procedure: RIGHT BREAST LUMPECTOMY WITH RADIOACTIVE SEED AND SENTINEL LYMPH NODE BIOPSY;  Surgeon: Donnice Bury, MD;  Location: [facility];  Service: General;  Laterality: Right;   RADIOACTIVE SEED GUIDED PARTIAL MASTECTOMY WITH AXILLARY SENTINEL LYMPH NODE BIOPSY Left 05/20/2016   Procedure: LEFT BREAST RADIOACTIVE SEED (two seeds) GUIDED LUMPECTOMY WITH LEFT AXILLARY SENTINEL LYMPH NODE BIOPSY AND LEFT SEED TARGETED AXILLARY LYMPH NODE EXCISION;  Surgeon: Donnice Bury, MD;  Location: [facility];  Service: General;  Laterality: Left;   RE-EXCISION OF BREAST CANCER,SUPERIOR MARGINS Right 05/20/2016   Procedure: RE-EXCISION OF RIGHT BREAST MARGIN;  Surgeon: Donnice Bury, MD;  Location: [facility];  Service: General;  Laterality: Right;   RE-EXCISION OF BREAST CANCER,SUPERIOR MARGINS Left 06/02/2016   Procedure: RE-EXCISION OF BREAST CANCER,SUPERIOR MARGINS;  Surgeon: Donnice Bury, MD;  Location: [facility];  Service: General;  Laterality: Left;   REMOVAL OF TISSUE EXPANDER AND PLACEMENT OF IMPLANT Left 04/17/2017   Procedure: REMOVAL OF LEFT TISSUE EXPANDER WITH PLACEMENT OF LEFT SILICONE BREAST IMPLANT;  Surgeon: Arelia Filippo, MD;  Location: [facility];  Service: Plastics;  Laterality: Left;   ROBOTIC ASSISTED LAPAROSCOPIC LYSIS OF ADHESION N/A 06/26/2022   Procedure: XI ROBOTIC ASSISTED LAPAROSCOPIC LYSIS OF ADHESION;  Surgeon: Viktoria Comer SAUNDERS, MD;  Location: [facility];  Service: Gynecology;  Laterality: N/A;   ROBOTIC ASSISTED TOTAL HYSTERECTOMY WITH BILATERAL SALPINGO OOPHERECTOMY N/A 06/26/2022   Procedure: XI ROBOTIC ASSISTED TOTAL HYSTERECTOMY WITH BILATERAL SALPINGO OOPHORECTOMY, MINI LAPAROTOMY;  Surgeon: Viktoria Comer SAUNDERS, MD;  Location: [facility];  Service: Gynecology;  Laterality: N/A;   shoulder surgery for arthritis Left 07/2021   arthroscopic done at  [facility] [place]   Patient Active Problem List   Diagnosis Date Noted   Pelvic adhesions 06/26/2022   Degenerative tear of medial meniscus 01/23/2022   Greater trochanteric bursitis of right hip 06/11/2021   Arthritis of carpometacarpal [facility]) joint of left thumb 06/11/2021   Mass of right lung 07/13/2020   Arthritis 08/03/2019   Rosacea 08/03/2019   Pes anserinus bursitis of right knee 03/28/2019   Pain in right knee 03/22/2019   Thickened endometrium 10/24/2018   Adhesive capsulitis of right shoulder 10/18/2018   Pain in right hand 10/05/2018   Tear of biceps tendon 09/21/2018   Moderate persistent asthma, uncomplicated 06/29/2017   Hallux rigidus of right foot 06/15/2017   Left Achilles tendinitis 06/15/2017   History of bilateral breast cancer 04/23/2017   Mild persistent asthma, uncomplicated 02/12/2017   Other seasonal allergic rhinitis 02/12/2017   History of therapeutic radiation 02/11/2017   Malignant neoplasm of overlapping sites of left breast in female, estrogen receptor positive (HCC) 10/27/2016   Sinusitis 09/01/2016   Hyponatremia 09/01/2016  Acquired absence of left breast 07/16/2016   Malignant neoplasm of upper-outer quadrant of right breast in female, estrogen receptor positive (HCC) 04/19/2016   Dizziness after extension of neck 02/19/2016   Sensorineural hearing loss (SNHL), bilateral 02/19/2016   Tinnitus of both ears 02/19/2016   Bunion of great toe of right foot 04/04/2015   Hemorrhoid 11/23/2014   Anal skin tag 11/23/2014   Incontinence of urine 07/28/2014   Retrocalcaneal bursitis 05/11/2014   Pain in joint, ankle and foot 05/11/2014   Heart burn    Joint pain    Abnormal Pap smear    Osteopenia     PCP: Charlott Dorn LABOR, MD  REFERRING PROVIDER: Leonce Katz, DO  REFERRING DIAG: Cervicalgia; Pain of right hip  THERAPY DIAG:  Pain in right hip  Cervicalgia  Muscle weakness (generalized)  Rationale for Evaluation  and Treatment: Rehabilitation  ONSET DATE: Chronic, approximately 3 months   SUBJECTIVE SUBJECTIVE STATEMENT: Patient reports she had leg and foot cramps off and on last night so is more sleepy today.   Eval: Patient reports neck pain and tightening of her shoulders for close to 3 months with gradual worsening of pain. Occasionally she turns it and feels/hears this scraping and popping. She states she currently feels discomfort in the left side of the neck and the longer she sits the more difficulty it gets, also turning her neck to the left. She denies any pain into the shoulder or arm. She plays a lot of golf and is uncomfortable after she plays. She did have a left arthroscopic shoulder surgery 3-4 years ago and mastectomy due to breast cancer that may have contributed to worsening posture. She also reports some right hip pain on the outside of the hip, and she did get a shot at her last appointment which seemed to help. She does see a trainer 2x/week.   PERTINENT HISTORY:  See PMH above  PAIN:  Are you having pain? Yes:  NPRS scale: 0/10 currently, 7-8/10 at worst Pain location: Left side of neck Pain description: Sharp, tight Aggravating factors: Sitting, turning neck to left, golf Relieving factors: Medication  NPRS scale: 1/10 Pain location: Right hip Pain description: Achy Aggravating factors: Standing, walking,  Relieving factors: Medication  PRECAUTIONS: None  PATIENT GOALS: Get rid of pain, learn how to manage pain and exercises at home, golf without limitation   OBJECTIVE:  Note: Objective measures were completed at Evaluation unless otherwise noted. PATIENT SURVEYS:  PSFS: 5.33 Sitting extended periods: 6 Turning neck to left: 4 Golf: 6  POSTURE:    Mild rounded shoulder and forward head posture  PALPATION: Tender to palpation left upper trap region   Hypomobility noted of the cervical and thoracic spine  CERVICAL ROM:   Active ROM A/PROM (deg) eval    05/30/2024  Flexion 35   Extension 45   Right lateral flexion 25   Left lateral flexion 30   Right rotation 70   Left rotation 55 62   (Blank rows = not tested)  UPPER EXTREMITY ROM:  Active ROM Right eval Left eval  Shoulder flexion 150 150  Shoulder extension    Shoulder abduction    Shoulder adduction    Shoulder extension    Shoulder internal rotation    Shoulder external rotation    Elbow flexion    Elbow extension    Wrist flexion    Wrist extension    Wrist ulnar deviation    Wrist radial deviation    Wrist  pronation    Wrist supination     (Blank rows = not tested)  UPPER EXTREMITY MMT:  MMT Right eval Left eval  Shoulder flexion 5 5  Shoulder extension 5 4+  Shoulder abduction 5 5  Shoulder adduction    Shoulder extension    Shoulder internal rotation 5 5  Shoulder external rotation 5 4+  Middle trapezius 4 4-  Lower trapezius    Elbow flexion    Elbow extension    Wrist flexion    Wrist extension    Wrist ulnar deviation    Wrist radial deviation    Wrist pronation    Wrist supination    Grip strength     (Blank rows = not tested)  CERVICAL SPECIAL TESTS:  Cervical radicular testing negative  LOWER EXTREMITY MMT:    MMT Right eval Left eval Rt / Lt 06/13/2024  Hip flexion     Hip extension   4- / 4-  Hip abduction   4- / 4  Hip adduction     Hip internal rotation     Hip external rotation     Knee flexion     Knee extension     Ankle dorsiflexion     Ankle plantarflexion     Ankle inversion     Ankle eversion      (Blank rows = not tested)   FUNCTIONAL TESTS:  DNF endurance: 7 seconds   TREATMENT [facility] Adult PT Treatment:                                                DATE: 07/11/2024 Recumbent bike L4 x 5 min to improve endurance and workload capacity Modified side plank and clamshell with red 3 x 10 each Deadlift with 35# 3 x 10 Split stance pallof press with L2 powerband 2 x 10 each  PATIENT EDUCATION:   Education details: HEP Person educated: Patient Education method: Explanation, Demonstration, Tactile cues, Verbal cues Education comprehension: verbalized understanding, returned demonstration, verbal cues required, tactile cues required, and needs further education  HOME EXERCISE PROGRAM: Access Code: [email or web address]   ASSESSMENT: CLINICAL IMPRESSION: Patient tolerated therapy well with no adverse effects. Therapy continues to focus on progressing her core stabilization and hip strengthening exercises with good tolerance. She has been able to progress with banded resistance and weight with her exercises and did not report any increase in pain with therapy this visit. No changes to her HEP this visit. Patient would benefit from continued skilled PT to progress mobility and strength in order to reduce pain and maximize functional ability.   Eval: Patient is a 73 y.o. female who was seen today for physical therapy evaluation and treatment for chronic neck and right hip pain. Primarily focused on assessing neck this visit. She does exhibit some spinal mobility deficits and muscular tightness that is impacting her range of motion. She also demonstrates postural deviations and DNF endurance impairment. She does have some residual strength deficit of the left shoulder that may be contributing to her symptoms.    OBJECTIVE IMPAIRMENTS: decreased activity tolerance, decreased ROM, decreased strength, impaired flexibility, postural dysfunction, and pain.   ACTIVITY LIMITATIONS: lifting and sitting  PARTICIPATION LIMITATIONS: community activity  PERSONAL FACTORS: Past/current experiences and Time since onset of injury/illness/exacerbation are also affecting patient's functional outcome.    GOALS: Goals reviewed with patient? Yes  SHORT TERM GOALS: Target date: 06/21/2024  Patient will be I with initial HEP in order to progress with therapy. Baseline: HEP provided at eval 07/01/2024:  independent Goal status: MET  2.  Patient will demonstrate left cervical rotation >/= 70 deg in order to reduce neck pain and limitation with golf Baseline: 55 deg 07/01/2024: not recently assessed Goal status: ONGOING  LONG TERM GOALS: Target date: 07/19/2024  Patient will be I with final HEP to maintain progress from PT. Baseline: HEP provided at eval Goal status: INITIAL  2.  Patient will report PSFS >/= 8 in order to indicate improvement in their functional ability. Baseline: 5.33 Goal status: INITIAL  3.  Patient will demonstrate DNF endurance >/= 30 seconds in order to improve postural control and reduce neck pain with sitting Baseline:  Goal status: INITIAL  4.  Patient will report neck pain </= 2/10 with all activity and cervical motion in order to reduce functional limitations Baseline: 7-8/10 pain at worse Goal status: INITIAL   PLAN: PT FREQUENCY: 1x/week  PT DURATION: 8 weeks  PLANNED INTERVENTIONS: 97164- PT Re-evaluation, 97750- Physical Performance Testing, 97110-Therapeutic exercises, 97530- Therapeutic activity, 97112- Neuromuscular re-education, 97535- Self Care, 02859- Manual therapy, 20560 (1-2 muscles), 20561 (3+ muscles)- Dry Needling, Patient/Family education, Taping, Joint mobilization, Joint manipulation, Spinal manipulation, Spinal mobilization, Cryotherapy, and Moist heat  PLAN FOR NEXT SESSION: Review HEP and progress PRN, continue progression of right hip strengthening and stability, manual/TPDN for cervical and left upper trap region, progress cervical and thoracic mobility, postural strengthening and DNF endurance   Elaine Daring, PT, DPT, LAT, ATC 07/11/2024  4:02 PM Phone: [phone] Fax: [phone]

## 2024-07-14 ENCOUNTER — Encounter: Admitting: Physical Therapy

## 2024-07-19 ENCOUNTER — Encounter: Admitting: Physical Therapy

## 2024-07-27 ENCOUNTER — Other Ambulatory Visit: Payer: Self-pay

## 2024-07-27 ENCOUNTER — Encounter: Payer: Self-pay | Admitting: Physical Therapy

## 2024-07-27 ENCOUNTER — Ambulatory Visit: Admitting: Physical Therapy

## 2024-07-27 DIAGNOSIS — M25551 Pain in right hip: Secondary | ICD-10-CM

## 2024-07-27 DIAGNOSIS — M6281 Muscle weakness (generalized): Secondary | ICD-10-CM | POA: Diagnosis not present

## 2024-07-27 NOTE — Therapy (Signed)
 " OUTPATIENT PHYSICAL THERAPY TREATMENT   Patient Name: Sabrina Tapia MRN: [id number] DOB:[birth year], 73 y.o., female Today's Date: 07/27/2024   END OF SESSION:  PT End of Session - 07/27/24 1151     Visit Number 9    Number of Visits 27    Date for Recertification  09/21/24    Authorization Type [employer] MCR    Authorization Time Period 05/30/2024 - 08/29/2023    Authorization - Visit Number 8    Authorization - Number of Visits 8    Progress Note Due on Visit 10    PT Start Time 1145    PT Stop Time 1230    PT Time Calculation (min) 45 min    Activity Tolerance Patient tolerated treatment well    Behavior During Therapy WFL for tasks assessed/performed                  Past Medical History:  Diagnosis Date   Allergy-induced asthma    prn inhaler   Arterial calcification    sees pcp for takes atorvastatin  for   CAD (coronary artery disease)    Constipation    Emphysema of lung (HCC)    Endometrial thickening on ultrasound    Environmental and seasonal allergies    Family history of adverse reaction to anesthesia    pt's father has hx. of being hard to wake up post-op x 1 time after 8 hour whipple surgery   GERD (gastroesophageal reflux disease)    Hemorrhoids    Hisory of Migraines    History of basal cell carcinoma (BCC) excision    right clavicle   History of breast cancer 2017   bilateral   History of radiation therapy    08/19/16- 09/29/16, Right Breast 50 Gy in 25 fractions, Right Breast boost 10 Gy in 5 fractions, Left Breast 50.4 Gy in 28 fractions   Hypertension    OSA (obstructive sleep apnea)    uses dental appliance mild /moderate osa   Osteoarthritis    hands and feet   Osteopenia 02/2016   T score -1.3 FRAX 8.3%/0.7% stable from prior DEXA   Overactive bladder    Stroke (HCC)    Thyroid  nodule 2012   Solitary nodule in the lower pole of the left thyroid  lobe , pt unaware   Wears glasses for reading    Wears hearing aid in both ears     Past Surgical History:  Procedure Laterality Date   BREAST RECONSTRUCTION WITH PLACEMENT OF TISSUE EXPANDER AND FLEX HD (ACELLULAR HYDRATED DERMIS) Left 07/01/2016   Procedure: BREAST RECONSTRUCTION WITH PLACEMENT OF TISSUE EXPANDER AND  ALLODERM;  Surgeon: Earlis Ranks, MD;  Location: [facility];  Service: Plastics;  Laterality: Left;   BROW LIFT     COLONOSCOPY  2022   DILATATION & CURETTAGE/HYSTEROSCOPY WITH MYOSURE N/A 12/06/2018   Procedure: DILATATION & CURETTAGE/HYSTEROSCOPY WITH MYOSURE;  Surgeon: Cathlyn JAYSON Nikki Bobie FORBES, MD;  Location[facility];  Service: Gynecology;  Laterality: N/A;  follow 1st case   DILATATION & CURETTAGE/HYSTEROSCOPY WITH MYOSURE N/A 04/21/2022   Procedure: DILATATION & CURETTAGE/HYSTEROSCOPY;  Surgeon: Cathlyn JAYSON Nikki Bobie FORBES, MD;  Location: [facility];  Service: Gynecology;  Laterality: N/A;   INCISION AND DRAINAGE Left 06/02/2016   Procedure: DRAINAGE of left axilla seroma;  Surgeon: Donnice Bury, MD;  Location: [facility];  Service: General;  Laterality: Left;   LIPOSUCTION WITH LIPOFILLING Left 06/11/2018   Procedure: Lipofilling  from abdomen to left chest;  Surgeon: Arelia Filippo, MD;  Location: [facility];  Service: Plastics;  Laterality: Left;   MASTOPEXY Right 04/17/2017   Procedure: RIGHT MASTOPEXY;  Surgeon: Arelia Filippo, MD;  Location: [facility];  Service: Plastics;  Laterality: Right;   NIPPLE SPARING MASTECTOMY Left 07/01/2016   Procedure: LEFT NIPPLE SPARING MASTECTOMY;  Surgeon: Donnice Bury, MD;  Location: [facility];  Service: General;  Laterality: Left;   OPERATIVE ULTRASOUND N/A 04/21/2022   Procedure: OPERATIVE ULTRASOUND;  Surgeon: Cathlyn JAYSON Nikki Bobie FORBES, MD;  Location: [facility];  Service: Gynecology;  Laterality: N/A;   RADIOACTIVE SEED GUIDED PARTIAL MASTECTOMY WITH AXILLARY SENTINEL  LYMPH NODE BIOPSY Right 03/26/2016   Procedure: RIGHT BREAST LUMPECTOMY WITH RADIOACTIVE SEED AND SENTINEL LYMPH NODE BIOPSY;  Surgeon: Donnice Bury, MD;  Location: [facility];  Service: General;  Laterality: Right;   RADIOACTIVE SEED GUIDED PARTIAL MASTECTOMY WITH AXILLARY SENTINEL LYMPH NODE BIOPSY Left 05/20/2016   Procedure: LEFT BREAST RADIOACTIVE SEED (two seeds) GUIDED LUMPECTOMY WITH LEFT AXILLARY SENTINEL LYMPH NODE BIOPSY AND LEFT SEED TARGETED AXILLARY LYMPH NODE EXCISION;  Surgeon: Donnice Bury, MD;  Location: [facility];  Service: General;  Laterality: Left;   RE-EXCISION OF BREAST CANCER,SUPERIOR MARGINS Right 05/20/2016   Procedure: RE-EXCISION OF RIGHT BREAST MARGIN;  Surgeon: Donnice Bury, MD;  Location: [facility];  Service: General;  Laterality: Right;   RE-EXCISION OF BREAST CANCER,SUPERIOR MARGINS Left 06/02/2016   Procedure: RE-EXCISION OF BREAST CANCER,SUPERIOR MARGINS;  Surgeon: Donnice Bury, MD;  Location: [facility];  Service: General;  Laterality: Left;   REMOVAL OF TISSUE EXPANDER AND PLACEMENT OF IMPLANT Left 04/17/2017   Procedure: REMOVAL OF LEFT TISSUE EXPANDER WITH PLACEMENT OF LEFT SILICONE BREAST IMPLANT;  Surgeon: Arelia Filippo, MD;  Location:  [facility];  Service: Plastics;  Laterality: Left;   ROBOTIC ASSISTED LAPAROSCOPIC LYSIS OF ADHESION N/A 06/26/2022   Procedure: XI ROBOTIC ASSISTED LAPAROSCOPIC LYSIS OF ADHESION;  Surgeon: Viktoria Comer SAUNDERS, MD;  Location: [facility];  Service: Gynecology;  Laterality: N/A;   ROBOTIC ASSISTED TOTAL HYSTERECTOMY WITH BILATERAL SALPINGO OOPHERECTOMY N/A 06/26/2022   Procedure: XI ROBOTIC ASSISTED TOTAL HYSTERECTOMY WITH BILATERAL SALPINGO OOPHORECTOMY, MINI LAPAROTOMY;  Surgeon: Viktoria Comer SAUNDERS, MD;  Location: [facility];  Service: Gynecology;  Laterality: N/A;   shoulder surgery for arthritis Left 07/2021   arthroscopic done at  [facility] [place]   Patient Active Problem List   Diagnosis Date Noted   Pelvic adhesions 06/26/2022   Degenerative tear of medial meniscus 01/23/2022   Greater trochanteric bursitis of right hip 06/11/2021   Arthritis of carpometacarpal [facility]) joint of left thumb 06/11/2021   Mass of right lung 07/13/2020   Arthritis 08/03/2019   Rosacea 08/03/2019   Pes anserinus bursitis of right knee 03/28/2019   Pain in right knee 03/22/2019   Thickened endometrium 10/24/2018   Adhesive capsulitis of right shoulder 10/18/2018   Pain in right hand 10/05/2018   Tear of biceps tendon 09/21/2018   Moderate persistent asthma, uncomplicated 06/29/2017   Hallux rigidus of right foot 06/15/2017   Left Achilles tendinitis 06/15/2017   History of bilateral breast cancer 04/23/2017   Mild persistent asthma, uncomplicated 02/12/2017   Other seasonal allergic rhinitis 02/12/2017   History of therapeutic radiation 02/11/2017   Malignant neoplasm of overlapping sites of left breast in female, estrogen receptor positive (HCC) 10/27/2016   Sinusitis 09/01/2016   Hyponatremia  09/01/2016   Acquired absence of left breast 07/16/2016   Malignant neoplasm of upper-outer quadrant of right breast in female, estrogen receptor positive (HCC) 04/19/2016   Dizziness after extension of neck 02/19/2016   Sensorineural hearing loss (SNHL), bilateral 02/19/2016   Tinnitus of both ears 02/19/2016   Bunion of great toe of right foot 04/04/2015   Hemorrhoid 11/23/2014   Anal skin tag 11/23/2014   Incontinence of urine 07/28/2014   Retrocalcaneal bursitis 05/11/2014   Pain in joint, ankle and foot 05/11/2014   Heart burn    Joint pain    Abnormal Pap smear    Osteopenia     PCP: Charlott Dorn LABOR, MD  REFERRING PROVIDER: Leonce Katz, DO  REFERRING DIAG: Cervicalgia; Pain of right hip  THERAPY DIAG:  Pain in right hip  Muscle weakness (generalized)  Rationale for Evaluation and Treatment:  Rehabilitation  ONSET DATE: Chronic, approximately 3 months   SUBJECTIVE SUBJECTIVE STATEMENT: Patient reports she has played golf a few times since last visit and she has taken some medication for the right hip which seems to help.   Eval: Patient reports neck pain and tightening of her shoulders for close to 3 months with gradual worsening of pain. Occasionally she turns it and feels/hears this scraping and popping. She states she currently feels discomfort in the left side of the neck and the longer she sits the more difficulty it gets, also turning her neck to the left. She denies any pain into the shoulder or arm. She plays a lot of golf and is uncomfortable after she plays. She did have a left arthroscopic shoulder surgery 3-4 years ago and mastectomy due to breast cancer that may have contributed to worsening posture. She also reports some right hip pain on the outside of the hip, and she did get a shot at her last appointment which seemed to help. She does see a trainer 2x/week.   PERTINENT HISTORY:  See PMH above  PAIN:  Are you having pain? Yes:  NPRS scale: 0/10 Pain location: Left side of neck Pain description: Sharp, tight Aggravating factors: Sitting, turning neck to left, golf Relieving factors: Medication  NPRS scale: 0/10 Pain location: Right hip Pain description: Achy Aggravating factors: Standing, walking,  Relieving factors: Medication  PRECAUTIONS: None  PATIENT GOALS: Get rid of pain, learn how to manage pain and exercises at home, golf without limitation   OBJECTIVE:  Note: Objective measures were completed at Evaluation unless otherwise noted. PATIENT SURVEYS:  PSFS: 5.33 Sitting extended periods: 6 Turning neck to left: 4 Golf: 6  07/27/2024: PSFS: 7.33 Sitting extended periods: 8 Turning neck to left: 8 Golf: 6  POSTURE:    Mild rounded shoulder and forward head posture  PALPATION: Tender to palpation left upper trap region   Hypomobility  noted of the cervical and thoracic spine  CERVICAL ROM:   Active ROM A/PROM (deg) eval   05/30/2024   07/27/2024  Flexion 35    Extension 45    Right lateral flexion 25    Left lateral flexion 30    Right rotation 70    Left rotation 55 62 70   (Blank rows = not tested)  UPPER EXTREMITY ROM:  Active ROM Right eval Left eval  Shoulder flexion 150 150  Shoulder extension    Shoulder abduction    Shoulder adduction    Shoulder extension    Shoulder internal rotation    Shoulder external rotation    Elbow flexion  Elbow extension    Wrist flexion    Wrist extension    Wrist ulnar deviation    Wrist radial deviation    Wrist pronation    Wrist supination     (Blank rows = not tested)  UPPER EXTREMITY MMT:  MMT Right eval Left eval  Shoulder flexion 5 5  Shoulder extension 5 4+  Shoulder abduction 5 5  Shoulder adduction    Shoulder extension    Shoulder internal rotation 5 5  Shoulder external rotation 5 4+  Middle trapezius 4 4-  Lower trapezius    Elbow flexion    Elbow extension    Wrist flexion    Wrist extension    Wrist ulnar deviation    Wrist radial deviation    Wrist pronation    Wrist supination    Grip strength     (Blank rows = not tested)  CERVICAL SPECIAL TESTS:  Cervical radicular testing negative  LOWER EXTREMITY MMT:    MMT Right eval Left eval Rt / Lt 06/13/2024 Rt / Lt 07/27/2024  Hip flexion      Hip extension   4- / 4- 4- / 4-  Hip abduction   4- / 4 4- / 4  Hip adduction      Hip internal rotation      Hip external rotation      Knee flexion      Knee extension      Ankle dorsiflexion      Ankle plantarflexion      Ankle inversion      Ankle eversion       (Blank rows = not tested)   FUNCTIONAL TESTS:  DNF endurance: 7 seconds  07/27/2024: 18 seconds   TREATMENT [facility] Adult PT Treatment:                                                DATE: 07/27/2024 SL bridge with opposite knee to chest 2 x 10  each Modified side plank and clamshell with red 3 x 10 each Deadlift with 40# 3 x 10 Lateral side stepping with L5 powerband resistance at waist x 90 ft each Split stance pallof press with L2 powerband 2 x 15 each Split stance trunk rotations holding 12# med ball x 10 each   PATIENT EDUCATION:  Education details: POC extension, HEP Person educated: Patient Education method: Explanation, Demonstration, Tactile cues, Verbal cues Education comprehension: verbalized understanding, returned demonstration, verbal cues required, tactile cues required, and needs further education  HOME EXERCISE PROGRAM: Access Code: [email or web address]   ASSESSMENT: CLINICAL IMPRESSION: Patient tolerated therapy well with no adverse effects. She reports improvement in her functional status this visit and is progressing well with her exercises. Therapy continues to focus primarily on right hip strengthening and stability as she reports her neck is not bothering her. She does continue to exhibit right hip strength deficit but reports recently the right hip has been feeling better with activity. She is progressing well with her hip strengthening exercises and did not report any increase in pain this visit. No changes to her HEP this visit. Patient would benefit from continued skilled PT to progress mobility and strength in order to reduce pain and maximize functional ability, so will extend PT POC for 8 more weeks.   Eval: Patient is a 73 y.o. female who was seen today  for physical therapy evaluation and treatment for chronic neck and right hip pain. Primarily focused on assessing neck this visit. She does exhibit some spinal mobility deficits and muscular tightness that is impacting her range of motion. She also demonstrates postural deviations and DNF endurance impairment. She does have some residual strength deficit of the left shoulder that may be contributing to her symptoms.    OBJECTIVE IMPAIRMENTS: decreased activity  tolerance, decreased ROM, decreased strength, impaired flexibility, postural dysfunction, and pain.   ACTIVITY LIMITATIONS: lifting and sitting  PARTICIPATION LIMITATIONS: community activity  PERSONAL FACTORS: Past/current experiences and Time since onset of injury/illness/exacerbation are also affecting patient's functional outcome.    GOALS: Goals reviewed with patient? Yes  SHORT TERM GOALS: Target date: 06/21/2024  Patient will be I with initial HEP in order to progress with therapy. Baseline: HEP provided at eval 07/01/2024: independent Goal status: MET  2.  Patient will demonstrate left cervical rotation >/= 70 deg in order to reduce neck pain and limitation with golf Baseline: 55 deg 07/01/2024: not recently assessed 07/27/2024: 70 Goal status: MET  LONG TERM GOALS: Target date: 09/21/2024  Patient will be I with final HEP to maintain progress from PT. Baseline: HEP provided at eval 07/27/2024: progressing Goal status: ONGOING  2.  Patient will report PSFS >/= 8 in order to indicate improvement in their functional ability. Baseline: 5.33 07/27/2024: 7.33 Goal status: ONGOING  3.  Patient will demonstrate DNF endurance >/= 30 seconds in order to improve postural control and reduce neck pain with sitting Baseline:  07/27/2024: 18 seconds Goal status: ONGOING  4.  Patient will report neck pain </= 2/10 with all activity and cervical motion in order to reduce functional limitations Baseline: 7-8/10 pain at worse 07/27/2024: denies any neck pain Goal status: MET   PLAN: PT FREQUENCY: 1-2x/week  PT DURATION: 8 weeks  PLANNED INTERVENTIONS: 97164- PT Re-evaluation, 97750- Physical Performance Testing, 97110-Therapeutic exercises, 97530- Therapeutic activity, 97112- Neuromuscular re-education, 97535- Self Care, 02859- Manual therapy, 20560 (1-2 muscles), 20561 (3+ muscles)- Dry Needling, Patient/Family education, Taping, Joint mobilization, Joint manipulation, Spinal  manipulation, Spinal mobilization, Cryotherapy, and Moist heat  PLAN FOR NEXT SESSION: Review HEP and progress PRN, continue progression of right hip strengthening and stability, manual/TPDN for cervical and left upper trap region, progress cervical and thoracic mobility, postural strengthening and DNF endurance   Elaine Daring, PT, DPT, LAT, ATC 07/27/2024  12:36 PM Phone: [phone] Fax: [phone]       "

## 2024-08-01 ENCOUNTER — Telehealth: Payer: Self-pay | Admitting: Sports Medicine

## 2024-08-01 NOTE — Progress Notes (Signed)
 "               Sabrina Tapia D.CLEMENTEEN Sabrina [facility] [address] [place] [address] Phone: [phone]   Assessment and Plan:     1. Right hip pain (Primary) 2. Strain of tendon of right gluteus muscle -Chronic with exacerbation, subsequent visit - Nearly resolved right lateral/posterior hip pain.  Patient states that she did not feel any improvement after gluteal tendon CSI, however pain is now resolved so injection may have ultimately been effective - Patient has decreased Lipitor dose which she believes has decreased generalized myalgias including decreasing right hip pain.  Encouraged to discuss Lipitor dose with PCP - Use meloxicam  15 mg daily as needed for breakthrough pain.  Recommend limiting chronic NSAIDs to 1-2 doses per week to prevent long-term side effects. Use Tylenol  500 to 1000 mg tablets 2-3 times a day as needed for day-to-day pain relief.   - Continue HEP and physical therapy with goal of hip and gluteal muscle strengthening, stabilization     Pertinent previous records reviewed include none   Follow Up: As needed if no improvement or worsening of symptoms.  Could consider lumbar etiology of ongoing pain versus hip MRI   Subjective:   I, Sabrina Tapia, am serving as a neurosurgeon for Doctor Morene Tapia  Chief Complaint: upper trap pain   HPI:    03/11/2024 Patient is a 74 year old female with upper trap pain. Patient states that she is having cervical and L shoulder pain for past 6 weeks. Took meloxicam  for 8 days which was helpful. Pain occurs with cervical rotation. Still able to workout as pain is always after activity. Had 2 migraines in past month. Hx L shoulder arthroscopy.    04/05/2024 Patient states neck pain is a little better but not much    05/12/2024 Patient states upper trap is tight. Right hip pain, decreased ROM. Doesn't know if meloxicam  has helped.    06/27/24 Patient states neck is fine. Had dry needling from Dr.  Elaine. Hip has gone south. Been trying to strengthen the muscles in PT but can still feel pain in it.   08/02/2024 Patient states  hip pain is almost completely gone   Relevant Historical Information: history of breast cancer   Additional pertinent review of systems negative.  Current Medications[1]   Objective:     Vitals:   08/02/24 1437  BP: 120/76  Pulse: 87  SpO2: 98%  Weight: 162 lb (73.5 kg)  Height: 5' 3 (1.6 m)      Body mass index is 28.7 kg/m.    Physical Exam:    General: awake, alert, and oriented no acute distress, nontoxic Skin: no suspicious lesions or rashes Neuro:sensation intact distally with no deficits, normal muscle tone, no atrophy, strength 5/5 in all tested lower ext groups Psych: normal mood and affect, speech clear   Right hip: No deformity, swelling or wasting ROM Flexion 90, ext 30, IR 45, ER 45 NTTP over the hip flexors, greater trochanter, gluteal musculature, si joint, lumbar spine Negative log roll with FROM Negative FABER Negative FADIR Negative Piriformis test Negative trendelenberg Gait normal    Electronically signed by:  Sabrina Tapia D.CLEMENTEEN [facility]rina [facility] 2:47 PM 08/02/2024     [1]  Current Outpatient Medications:    albuterol  (PROAIR  HFA) 108 (90 Base) MCG/ACT inhaler, Inhale 2 puffs into the lungs every 4 (four) hours as needed for wheezing or shortness of breath., Disp: 1  Inhaler, Rfl: 1   ALPRAZolam  (XANAX ) 0.25 MG tablet, Take 0.25 mg by mouth daily as needed (travel)., Disp: , Rfl:    amLODipine (NORVASC) 2.5 MG tablet, Take 2.5 mg by mouth daily., Disp: , Rfl:    atorvastatin (LIPITOR) 10 MG tablet, Take 10 mg by mouth at bedtime., Disp: , Rfl:    azelastine  (ASTELIN ) 0.1 % nasal spray, Place 1 spray into both nostrils daily., Disp: , Rfl:    cetirizine (ZYRTEC) 10 MG tablet, Take 10 mg by mouth at bedtime., Disp: , Rfl:    fluticasone  (FLONASE ) 50 MCG/ACT nasal spray, Place 1 spray into both nostrils  daily., Disp: , Rfl:    hydrocortisone  (ANUSOL -HC) 2.5 % rectal cream, Place 1 application rectally as needed for hemorrhoids or itching., Disp: , Rfl:    ibuprofen  (ADVIL ) 200 MG tablet, Take 200 mg by mouth every 6 (six) hours as needed for moderate pain., Disp: , Rfl:    meloxicam  (MOBIC ) 15 MG tablet, Take 1 tablet (15 mg total) by mouth daily., Disp: 30 tablet, Rfl: 0   meloxicam  (MOBIC ) 15 MG tablet, Take 1 tablet (15 mg total) by mouth daily., Disp: 30 tablet, Rfl: 0   omeprazole (PRILOSEC) 20 MG capsule, Take 20 mg by mouth daily., Disp: , Rfl:    OVER THE COUNTER MEDICATION, Take 2 capsules by mouth daily. MEGASPOREBIOTIC, Disp: , Rfl:    psyllium (METAMUCIL) 58.6 % packet, Take 1 packet by mouth daily., Disp: , Rfl:    RESTASIS 0.05 % ophthalmic emulsion, Place 1 drop into both eyes 2 (two) times daily. (Patient not taking: Reported on 06/20/2024), Disp: , Rfl:    tamoxifen  (NOLVADEX ) 20 MG tablet, TAKE 1 TABLET(20 MG) BY MOUTH DAILY, Disp: 90 tablet, Rfl: 3   triamcinolone  (KENALOG) 0.1 %, Apply 1 Application topically daily as needed (psoriasis)., Disp: , Rfl:    valACYclovir  (VALTREX ) 1000 MG tablet, Take 1 tablet (1,000 mg total) by mouth 2 (two) times daily. Take for 1 day as directed above as needed. (Patient taking differently: Take 1,000 mg by mouth 2 (two) times daily as needed (fever blisters).), Disp: 30 tablet, Rfl: 1   valsartan-hydrochlorothiazide (DIOVAN-HCT) 160-12.5 MG tablet, Take 1 tablet by mouth daily., Disp: , Rfl:    venlafaxine  XR (EFFEXOR -XR) 75 MG 24 hr capsule, TAKE 1 CAPSULE(75 MG) BY MOUTH DAILY WITH BREAKFAST, Disp: 90 capsule, Rfl: 4   VITAMIN D PO, Take 2,000 Units by mouth daily., Disp: , Rfl:   "

## 2024-08-01 NOTE — Telephone Encounter (Signed)
 Will you please submit for [facility] reauthorization for 8 weeks/16 visits beginning 08/04/2023. Thank you!

## 2024-08-01 NOTE — Telephone Encounter (Signed)
 Hard copy uploaded and notes updated with date range and visits authorized.

## 2024-08-02 ENCOUNTER — Ambulatory Visit: Admitting: Sports Medicine

## 2024-08-02 VITALS — BP 120/76 | HR 87 | Ht 63.0 in | Wt 162.0 lb

## 2024-08-02 DIAGNOSIS — M25551 Pain in right hip: Secondary | ICD-10-CM

## 2024-08-02 DIAGNOSIS — S76011A Strain of muscle, fascia and tendon of right hip, initial encounter: Secondary | ICD-10-CM | POA: Diagnosis not present

## 2024-08-03 ENCOUNTER — Encounter: Admitting: Physical Therapy

## 2024-08-08 ENCOUNTER — Encounter: Payer: Self-pay | Admitting: Physical Therapy

## 2024-08-08 ENCOUNTER — Ambulatory Visit: Admitting: Physical Therapy

## 2024-08-08 ENCOUNTER — Other Ambulatory Visit: Payer: Self-pay

## 2024-08-08 DIAGNOSIS — M6281 Muscle weakness (generalized): Secondary | ICD-10-CM | POA: Diagnosis not present

## 2024-08-08 DIAGNOSIS — M25551 Pain in right hip: Secondary | ICD-10-CM | POA: Diagnosis not present

## 2024-08-08 NOTE — Therapy (Signed)
 " OUTPATIENT PHYSICAL THERAPY TREATMENT  Progress Note Reporting Period 05/24/2024 to 08/08/2024  See note below for Objective Data and Assessment of Progress/Goals.     Patient Name: Sabrina Tapia MRN: [id number] DOB:[birth year], 74 y.o., female Today's Date: 08/08/2024   END OF SESSION:  PT End of Session - 08/08/24 1357     Visit Number 10    Number of Visits 27    Date for Recertification  09/21/24    Authorization Type [employer] M[employer]    Authorization Time Period 08/03/2024 - 11/01/2024    Authorization - Visit Number 1    Authorization - Number of Visits 8    Progress Note Due on Visit 20    PT Start Time 1346    PT Stop Time 1430    PT Time Calculation (min) 44 min    Activity Tolerance Patient tolerated treatment well    Behavior During Therapy WFL for tasks assessed/performed                   Past Medical History:  Diagnosis Date   Allergy-induced asthma    prn inhaler   Arterial calcification    sees pcp for takes atorvastatin  for   CAD (coronary artery disease)    Constipation    Emphysema of lung (HCC)    Endometrial thickening on ultrasound    Environmental and seasonal allergies    Family history of adverse reaction to anesthesia    pt's father has hx. of being hard to wake up post-op x 1 time after 8 hour whipple surgery   GERD (gastroesophageal reflux disease)    Hemorrhoids    Hisory of Migraines    History of basal cell carcinoma (BCC) excision    right clavicle   History of breast cancer 2017   bilateral   History of radiation therapy    08/19/16- 09/29/16, Right Breast 50 Gy in 25 fractions, Right Breast boost 10 Gy in 5 fractions, Left Breast 50.4 Gy in 28 fractions   Hypertension    OSA (obstructive sleep apnea)    uses dental appliance mild /moderate osa   Osteoarthritis    hands and feet   Osteopenia 02/2016   T score -1.3 FRAX 8.3%/0.7% stable from prior DEXA   Overactive bladder    Stroke (HCC)    Thyroid  nodule 2012   Solitary  nodule in the lower pole of the left thyroid  lobe , pt unaware   Wears glasses for reading    Wears hearing aid in both ears    Past Surgical History:  Procedure Laterality Date   BREAST RECONSTRUCTION WITH PLACEMENT OF TISSUE EXPANDER AND FLEX HD (ACELLULAR HYDRATED DERMIS) Left 07/01/2016   Procedure: BREAST RECONSTRUCTION WITH PLACEMENT OF TISSUE EXPANDER AND  ALLODERM;  Surgeon: Earlis Ranks, MD;  Location: [facility];  Service: Plastics;  Laterality: Left;   BROW LIFT     COLONOSCOPY  2022   DILATATION & CURETTAGE/HYSTEROSCOPY WITH MYOSURE N/A 12/06/2018   Procedure: DILATATION & CURETTAGE/HYSTEROSCOPY WITH MYOSURE;  Surgeon: Cathlyn JAYSON Nikki Bobie FORBES, MD;  Location: [facility];  Service: Gynecology;  Laterality: N/A;  follow 1st case   DILATATION & CURETTAGE/HYSTEROSCOPY WITH MYOSURE N/A 04/21/2022   Procedure: DILATATION & CURETTAGE/HYSTEROSCOPY;  Surgeon: Cathlyn JAYSON Nikki Bobie FORBES, MD;  Location: [facility];  Service: Gynecology;  Laterality: N/A;   INCISION AND DRAINAGE Left 06/02/2016   Procedure: DRAINAGE of left axilla seroma;  Surgeon: Donnice Bury, MD;  Location: [facility];  Service: General;  Laterality: Left;   LIPOSUCTION WITH LIPOFILLING Left 06/11/2018   Procedure: Lipofilling from abdomen to left chest;  Surgeon: Arelia Filippo, MD;  Location: [facility];  Service: Plastics;  Laterality: Left;   MASTOPEXY Right 04/17/2017   Procedure: RIGHT MASTOPEXY;  Surgeon: Arelia Filippo, MD;  Location: [facility];  Service: Plastics;  Laterality: Right;   NIPPLE SPARING MASTECTOMY Left 07/01/2016   Procedure: LEFT NIPPLE SPARING MASTECTOMY;  Surgeon: Donnice Bury, MD;  Location: [facility];  Service: General;  Laterality: Left;   OPERATIVE ULTRASOUND N/A 04/21/2022   Procedure: OPERATIVE ULTRASOUND;  Surgeon: Cathlyn JAYSON Nikki Bobie FORBES, MD;  Location: [facility];  Service: Gynecology;  Laterality: N/A;   RADIOACTIVE SEED GUIDED PARTIAL MASTECTOMY WITH AXILLARY SENTINEL LYMPH NODE BIOPSY Right 03/26/2016   Procedure: RIGHT BREAST LUMPECTOMY WITH RADIOACTIVE SEED AND SENTINEL LYMPH NODE BIOPSY;  Surgeon: Donnice Bury, MD;  Location: [facility];  Service: General;  Laterality: Right;   RADIOACTIVE SEED GUIDED PARTIAL MASTECTOMY WITH AXILLARY SENTINEL LYMPH NODE BIOPSY Left 05/20/2016   Procedure: LEFT BREAST RADIOACTIVE SEED (two seeds) GUIDED LUMPECTOMY WITH LEFT AXILLARY SENTINEL LYMPH NODE BIOPSY AND LEFT SEED TARGETED AXILLARY LYMPH NODE EXCISION;  Surgeon: Donnice Bury, MD;  Location: [facility];  Service: General;  Laterality: Left;   RE-EXCISION OF BREAST CANCER,SUPERIOR MARGINS Right 05/20/2016   Procedure: RE-EXCISION OF RIGHT BREAST MARGIN;  Surgeon: Donnice Bury, MD;  Location: [facility];  Service: General;  Laterality: Right;   RE-EXCISION OF BREAST CANCER,SUPERIOR MARGINS Left 06/02/2016   Procedure: RE-EXCISION OF BREAST CANCER,SUPERIOR MARGINS;  Surgeon: Donnice Bury, MD;  Location: [facility];  Service: General;  Laterality: Left;   REMOVAL OF TISSUE EXPANDER AND PLACEMENT OF IMPLANT Left 04/17/2017   Procedure: REMOVAL OF LEFT TISSUE EXPANDER WITH PLACEMENT OF LEFT SILICONE BREAST IMPLANT;  Surgeon: Arelia Filippo, MD;  Location: [facility];  Service: Plastics;  Laterality: Left;   ROBOTIC ASSISTED LAPAROSCOPIC LYSIS OF ADHESION N/A 06/26/2022   Procedure: XI ROBOTIC ASSISTED LAPAROSCOPIC LYSIS OF ADHESION;  Surgeon: Viktoria Comer SAUNDERS, MD;  Location: [facility];  Service: Gynecology;  Laterality: N/A;   ROBOTIC ASSISTED TOTAL HYSTERECTOMY WITH BILATERAL SALPINGO OOPHERECTOMY N/A 06/26/2022   Procedure: XI ROBOTIC ASSISTED TOTAL HYSTERECTOMY WITH BILATERAL SALPINGO OOPHORECTOMY, MINI LAPAROTOMY;  Surgeon: Viktoria Comer SAUNDERS, MD;   Location: [facility];  Service: Gynecology;  Laterality: N/A;   shoulder surgery for arthritis Left 07/2021   arthroscopic done at [facility] [place]   Patient Active Problem List   Diagnosis Date Noted   Pelvic adhesions 06/26/2022   Degenerative tear of medial meniscus 01/23/2022   Greater trochanteric bursitis of right hip 06/11/2021   Arthritis of carpometacarpal [facility]) joint of left thumb 06/11/2021   Mass of right lung 07/13/2020   Arthritis 08/03/2019   Rosacea 08/03/2019   Pes anserinus bursitis of right knee 03/28/2019   Pain in right knee 03/22/2019   Thickened endometrium 10/24/2018   Adhesive capsulitis of right shoulder 10/18/2018   Pain in right hand 10/05/2018   Tear of biceps tendon 09/21/2018   Moderate persistent asthma, uncomplicated 06/29/2017   Hallux rigidus of right foot 06/15/2017   Left Achilles tendinitis 06/15/2017   History of bilateral breast cancer 04/23/2017   Mild persistent asthma, uncomplicated 02/12/2017   Other seasonal allergic rhinitis 02/12/2017   History of therapeutic radiation 02/11/2017  Malignant neoplasm of overlapping sites of left breast in female, estrogen receptor positive (HCC) 10/27/2016   Sinusitis 09/01/2016   Hyponatremia 09/01/2016   Acquired absence of left breast 07/16/2016   Malignant neoplasm of upper-outer quadrant of right breast in female, estrogen receptor positive (HCC) 04/19/2016   Dizziness after extension of neck 02/19/2016   Sensorineural hearing loss (SNHL), bilateral 02/19/2016   Tinnitus of both ears 02/19/2016   Bunion of great toe of right foot 04/04/2015   Hemorrhoid 11/23/2014   Anal skin tag 11/23/2014   Incontinence of urine 07/28/2014   Retrocalcaneal bursitis 05/11/2014   Pain in joint, ankle and foot 05/11/2014   Heart burn    Joint pain    Abnormal Pap smear    Osteopenia     PCP: Charlott Dorn LABOR, MD  REFERRING PROVIDER: Leonce Katz, DO  REFERRING DIAG: Cervicalgia;  Pain of right hip  THERAPY DIAG:  Pain in right hip  Muscle weakness (generalized)  Rationale for Evaluation and Treatment: Rehabilitation  ONSET DATE: Chronic, approximately 3 months   SUBJECTIVE SUBJECTIVE STATEMENT: Patient reports she is doing well. There was a day or so that she did feels some right hip pain.  Eval: Patient reports neck pain and tightening of her shoulders for close to 3 months with gradual worsening of pain. Occasionally she turns it and feels/hears this scraping and popping. She states she currently feels discomfort in the left side of the neck and the longer she sits the more difficulty it gets, also turning her neck to the left. She denies any pain into the shoulder or arm. She plays a lot of golf and is uncomfortable after she plays. She did have a left arthroscopic shoulder surgery 3-4 years ago and mastectomy due to breast cancer that may have contributed to worsening posture. She also reports some right hip pain on the outside of the hip, and she did get a shot at her last appointment which seemed to help. She does see a trainer 2x/week.   PERTINENT HISTORY:  See PMH above  PAIN:  Are you having pain? Yes:  NPRS scale: 0/10 Pain location: Left side of neck Pain description: Sharp, tight Aggravating factors: Sitting, turning neck to left, golf Relieving factors: Medication  NPRS scale: 0/10 Pain location: Right hip Pain description: Achy Aggravating factors: Standing, walking,  Relieving factors: Medication  PRECAUTIONS: None  PATIENT GOALS: Get rid of pain, learn how to manage pain and exercises at home, golf without limitation   OBJECTIVE:  Note: Objective measures were completed at Evaluation unless otherwise noted. PATIENT SURVEYS:  PSFS: 5.33 Sitting extended periods: 6 Turning neck to left: 4 Golf: 6  07/27/2024: PSFS: 7.33 Sitting extended periods: 8 Turning neck to left: 8 Golf: 6  POSTURE:    Mild rounded shoulder and  forward head posture  PALPATION: Tender to palpation left upper trap region   Hypomobility noted of the cervical and thoracic spine  CERVICAL ROM:   Active ROM A/PROM (deg) eval   05/30/2024   07/27/2024  Flexion 35    Extension 45    Right lateral flexion 25    Left lateral flexion 30    Right rotation 70    Left rotation 55 62 70   (Blank rows = not tested)  UPPER EXTREMITY ROM:  Active ROM Right eval Left eval  Shoulder flexion 150 150  Shoulder extension    Shoulder abduction    Shoulder adduction    Shoulder extension    Shoulder  internal rotation    Shoulder external rotation    Elbow flexion    Elbow extension    Wrist flexion    Wrist extension    Wrist ulnar deviation    Wrist radial deviation    Wrist pronation    Wrist supination     (Blank rows = not tested)  UPPER EXTREMITY MMT:  MMT Right eval Left eval  Shoulder flexion 5 5  Shoulder extension 5 4+  Shoulder abduction 5 5  Shoulder adduction    Shoulder extension    Shoulder internal rotation 5 5  Shoulder external rotation 5 4+  Middle trapezius 4 4-  Lower trapezius    Elbow flexion    Elbow extension    Wrist flexion    Wrist extension    Wrist ulnar deviation    Wrist radial deviation    Wrist pronation    Wrist supination    Grip strength     (Blank rows = not tested)  CERVICAL SPECIAL TESTS:  Cervical radicular testing negative  LOWER EXTREMITY MMT:    MMT Right eval Left eval Rt / Lt 06/13/2024 Rt / Lt 07/27/2024  Hip flexion      Hip extension   4- / 4- 4- / 4-  Hip abduction   4- / 4 4- / 4  Hip adduction      Hip internal rotation      Hip external rotation      Knee flexion      Knee extension      Ankle dorsiflexion      Ankle plantarflexion      Ankle inversion      Ankle eversion       (Blank rows = not tested)   FUNCTIONAL TESTS:  DNF endurance: 7 seconds  07/27/2024: 18 seconds   TREATMENT [facility] Adult PT Treatment:                                                 DATE: 07/27/2024 Recumbent bike L4 x 5 min to improve endurance and workload capacity Deadlift with 40# x 10, 50# 3 x 8 Pallof press with L2 powerband 3 x 10 each Split stance trunk rotations holding 12# med ball 3 x 5 each  Standing windmill 2 x 10 each  PATIENT EDUCATION:  Education details: HEP Person educated: Patient Education method: Explanation, Demonstration, Tactile cues, Verbal cues Education comprehension: verbalized understanding, returned demonstration, verbal cues required, tactile cues required, and needs further education  HOME EXERCISE PROGRAM: Access Code: [email or web address]   ASSESSMENT: CLINICAL IMPRESSION: Patient tolerated therapy well with no adverse effects. Therapy continues to focus on progressing her strength and mobility with good tolerance. She was able to progress weight with her lifting and maintain good form. Incorporated more spinal mobility and separation between the trunk and pelvis. No changes made to her HEP this visit. Patient would benefit from continued skilled PT to progress mobility and strength in order to reduce pain and maximize functional ability.   Eval: Patient is a 74 y.o. female who was seen today for physical therapy evaluation and treatment for chronic neck and right hip pain. Primarily focused on assessing neck this visit. She does exhibit some spinal mobility deficits and muscular tightness that is impacting her range of motion. She also demonstrates postural deviations and DNF endurance impairment. She does  have some residual strength deficit of the left shoulder that may be contributing to her symptoms.    OBJECTIVE IMPAIRMENTS: decreased activity tolerance, decreased ROM, decreased strength, impaired flexibility, postural dysfunction, and pain.   ACTIVITY LIMITATIONS: lifting and sitting  PARTICIPATION LIMITATIONS: community activity  PERSONAL FACTORS: Past/current experiences and Time since onset of  injury/illness/exacerbation are also affecting patient's functional outcome.    GOALS: Goals reviewed with patient? Yes  SHORT TERM GOALS: Target date: 06/21/2024  Patient will be I with initial HEP in order to progress with therapy. Baseline: HEP provided at eval 07/01/2024: independent Goal status: MET  2.  Patient will demonstrate left cervical rotation >/= 70 deg in order to reduce neck pain and limitation with golf Baseline: 55 deg 07/01/2024: not recently assessed 07/27/2024: 70 Goal status: MET  LONG TERM GOALS: Target date: 09/21/2024  Patient will be I with final HEP to maintain progress from PT. Baseline: HEP provided at eval 07/27/2024: progressing Goal status: ONGOING  2.  Patient will report PSFS >/= 8 in order to indicate improvement in their functional ability. Baseline: 5.33 07/27/2024: 7.33 Goal status: ONGOING  3.  Patient will demonstrate DNF endurance >/= 30 seconds in order to improve postural control and reduce neck pain with sitting Baseline:  07/27/2024: 18 seconds Goal status: ONGOING  4.  Patient will report neck pain </= 2/10 with all activity and cervical motion in order to reduce functional limitations Baseline: 7-8/10 pain at worse 07/27/2024: denies any neck pain Goal status: MET   PLAN: PT FREQUENCY: 1-2x/week  PT DURATION: 8 weeks  PLANNED INTERVENTIONS: 97164- PT Re-evaluation, 97750- Physical Performance Testing, 97110-Therapeutic exercises, 97530- Therapeutic activity, 97112- Neuromuscular re-education, 97535- Self Care, 02859- Manual therapy, 20560 (1-2 muscles), 20561 (3+ muscles)- Dry Needling, Patient/Family education, Taping, Joint mobilization, Joint manipulation, Spinal manipulation, Spinal mobilization, Cryotherapy, and Moist heat  PLAN FOR NEXT SESSION: Review HEP and progress PRN, continue progression of right hip strengthening and stability, manual/TPDN for cervical and left upper trap region, progress cervical and thoracic  mobility, postural strengthening and DNF endurance   Elaine Daring, PT, DPT, LAT, ATC 08/08/2024  2:37 PM Phone: [phone] Fax: [phone]       "

## 2024-08-11 ENCOUNTER — Ambulatory Visit: Admitting: Physical Therapy

## 2024-08-11 ENCOUNTER — Encounter: Payer: Self-pay | Admitting: Physical Therapy

## 2024-08-11 ENCOUNTER — Other Ambulatory Visit: Payer: Self-pay

## 2024-08-11 DIAGNOSIS — M6281 Muscle weakness (generalized): Secondary | ICD-10-CM

## 2024-08-11 DIAGNOSIS — M25551 Pain in right hip: Secondary | ICD-10-CM

## 2024-08-11 NOTE — Therapy (Signed)
 " OUTPATIENT PHYSICAL THERAPY TREATMENT     Patient Name: Sabrina Tapia MRN: [id number] DOB:[birth year], 74 y.o., female Today's Date: 08/11/2024   END OF SESSION:  PT End of Session - 08/11/24 1354     Visit Number 11    Number of Visits 27    Date for Recertification  09/21/24    Authorization Type [employer] MCR    Authorization Time Period 08/03/2024 - 11/01/2024    Authorization - Visit Number 2    Authorization - Number of Visits 8    Progress Note Due on Visit 20    PT Start Time 1350    PT Stop Time 1435    PT Time Calculation (min) 45 min    Activity Tolerance Patient tolerated treatment well    Behavior During Therapy WFL for tasks assessed/performed                    Past Medical History:  Diagnosis Date   Allergy-induced asthma    prn inhaler   Arterial calcification    sees pcp for takes atorvastatin  for   CAD (coronary artery disease)    Constipation    Emphysema of lung (HCC)    Endometrial thickening on ultrasound    Environmental and seasonal allergies    Family history of adverse reaction to anesthesia    pt's father has hx. of being hard to wake up post-op x 1 time after 8 hour whipple surgery   GERD (gastroesophageal reflux disease)    Hemorrhoids    Hisory of Migraines    History of basal cell carcinoma (BCC) excision    right clavicle   History of breast cancer 2017   bilateral   History of radiation therapy    08/19/16- 09/29/16, Right Breast 50 Gy in 25 fractions, Right Breast boost 10 Gy in 5 fractions, Left Breast 50.4 Gy in 28 fractions   Hypertension    OSA (obstructive sleep apnea)    uses dental appliance mild /moderate osa   Osteoarthritis    hands and feet   Osteopenia 02/2016   T score -1.3 FRAX 8.3%/0.7% stable from prior DEXA   Overactive bladder    Stroke (HCC)    Thyroid  nodule 2012   Solitary nodule in the lower pole of the left thyroid  lobe , pt unaware   Wears glasses for reading    Wears hearing aid in both ears     Past Surgical History:  Procedure Laterality Date   BREAST RECONSTRUCTION WITH PLACEMENT OF TISSUE EXPANDER AND FLEX HD (ACELLULAR HYDRATED DERMIS) Left 07/01/2016   Procedure: BREAST RECONSTRUCTION WITH PLACEMENT OF TISSUE EXPANDER AND  ALLODERM;  Surgeon: Earlis Ranks, MD;  Location: [facility];  Service: Plastics;  Laterality: Left;   BROW LIFT     COLONOSCOPY  2022   DILATATION & CURETTAGE/HYSTEROSCOPY WITH MYOSURE N/A 12/06/2018   Procedure: DILATATION & CURETTAGE/HYSTEROSCOPY WITH MYOSURE;  Surgeon: Cathlyn JAYSON Nikki Bobie FORBES, MD;  Location: [facility];  Service: Gynecology;  Laterality: N/A;  follow 1st case   DILATATION & CURETTAGE/HYSTEROSCOPY WITH MYOSURE N/A 04/21/2022   Procedure: DILATATION & CURETTAGE/HYSTEROSCOPY;  Surgeon: Cathlyn JAYSON Nikki Bobie FORBES, MD;  Location: [facility];  Service: Gynecology;  Laterality: N/A;   INCISION AND DRAINAGE Left 06/02/2016   Procedure: DRAINAGE of left axilla seroma;  Surgeon: Donnice Bury, MD;  Location: [facility];  Service: General;  Laterality: Left;   LIPOSUCTION WITH LIPOFILLING Left 06/11/2018  Procedure: Lipofilling from abdomen to left chest;  Surgeon: Arelia Filippo, MD;  Location: [facility];  Service: Plastics;  Laterality: Left;   MASTOPEXY Right 04/17/2017   Procedure: RIGHT MASTOPEXY;  Surgeon: Arelia Filippo, MD;  Location: [facility];  Service: Plastics;  Laterality: Right;   NIPPLE SPARING MASTECTOMY Left 07/01/2016   Procedure: LEFT NIPPLE SPARING MASTECTOMY;  Surgeon: Donnice Bury, MD;  Location: [facility];  Service: General;  Laterality: Left;   OPERATIVE ULTRASOUND N/A 04/21/2022   Procedure: OPERATIVE ULTRASOUND;  Surgeon: Cathlyn JAYSON Nikki Bobie FORBES, MD;  Location: [facility];  Service: Gynecology;  Laterality: N/A;   RADIOACTIVE SEED GUIDED PARTIAL MASTECTOMY WITH AXILLARY  SENTINEL LYMPH NODE BIOPSY Right 03/26/2016   Procedure: RIGHT BREAST LUMPECTOMY WITH RADIOACTIVE SEED AND SENTINEL LYMPH NODE BIOPSY;  Surgeon: Donnice Bury, MD;  Location: [facility];  Service: General;  Laterality: Right;   RADIOACTIVE SEED GUIDED PARTIAL MASTECTOMY WITH AXILLARY SENTINEL LYMPH NODE BIOPSY Left 05/20/2016   Procedure: LEFT BREAST RADIOACTIVE SEED (two seeds) GUIDED LUMPECTOMY WITH LEFT AXILLARY SENTINEL LYMPH NODE BIOPSY AND LEFT SEED TARGETED AXILLARY LYMPH NODE EXCISION;  Surgeon: Donnice Bury, MD;  Location: [facility];  Service: General;  Laterality: Left;   RE-EXCISION OF BREAST CANCER,SUPERIOR MARGINS Right 05/20/2016   Procedure: RE-EXCISION OF RIGHT BREAST MARGIN;  Surgeon: Donnice Bury, MD;  Location: [facility];  Service: General;  Laterality: Right;   RE-EXCISION OF BREAST CANCER,SUPERIOR MARGINS Left 06/02/2016   Procedure: RE-EXCISION OF BREAST CANCER,SUPERIOR MARGINS;  Surgeon: Donnice Bury, MD;  Location: [facility];  Service: General;  Laterality: Left;   REMOVAL OF TISSUE EXPANDER AND PLACEMENT OF IMPLANT Left 04/17/2017   Procedure: REMOVAL OF LEFT TISSUE EXPANDER WITH PLACEMENT OF LEFT SILICONE BREAST IMPLANT;  Surgeon: Arelia Filippo, MD;  Location: [facility];  Service: Plastics;  Laterality: Left;   ROBOTIC ASSISTED LAPAROSCOPIC LYSIS OF ADHESION N/A 06/26/2022   Procedure: XI ROBOTIC ASSISTED LAPAROSCOPIC LYSIS OF ADHESION;  Surgeon: Viktoria Comer SAUNDERS, MD;  Location: [facility];  Service: Gynecology;  Laterality: N/A;   ROBOTIC ASSISTED TOTAL HYSTERECTOMY WITH BILATERAL SALPINGO OOPHERECTOMY N/A 06/26/2022   Procedure: XI ROBOTIC ASSISTED TOTAL HYSTERECTOMY WITH BILATERAL SALPINGO OOPHORECTOMY, MINI LAPAROTOMY;  Surgeon: Viktoria Comer SAUNDERS, MD;  Location: [facility];  Service: Gynecology;  Laterality: N/A;   shoulder surgery for arthritis Left 07/2021   arthroscopic  done at [facility] [place]   Patient Active Problem List   Diagnosis Date Noted   Pelvic adhesions 06/26/2022   Degenerative tear of medial meniscus 01/23/2022   Greater trochanteric bursitis of right hip 06/11/2021   Arthritis of carpometacarpal [facility]) joint of left thumb 06/11/2021   Mass of right lung 07/13/2020   Arthritis 08/03/2019   Rosacea 08/03/2019   Pes anserinus bursitis of right knee 03/28/2019   Pain in right knee 03/22/2019   Thickened endometrium 10/24/2018   Adhesive capsulitis of right shoulder 10/18/2018   Pain in right hand 10/05/2018   Tear of biceps tendon 09/21/2018   Moderate persistent asthma, uncomplicated 06/29/2017   Hallux rigidus of right foot 06/15/2017   Left Achilles tendinitis 06/15/2017   History of bilateral breast cancer 04/23/2017   Mild persistent asthma, uncomplicated 02/12/2017   Other seasonal allergic rhinitis 02/12/2017   History of therapeutic radiation 02/11/2017   Malignant neoplasm of overlapping sites of left breast in female, estrogen receptor positive (HCC) 10/27/2016   Sinusitis 09/01/2016  Hyponatremia 09/01/2016   Acquired absence of left breast 07/16/2016   Malignant neoplasm of upper-outer quadrant of right breast in female, estrogen receptor positive (HCC) 04/19/2016   Dizziness after extension of neck 02/19/2016   Sensorineural hearing loss (SNHL), bilateral 02/19/2016   Tinnitus of both ears 02/19/2016   Bunion of great toe of right foot 04/04/2015   Hemorrhoid 11/23/2014   Anal skin tag 11/23/2014   Incontinence of urine 07/28/2014   Retrocalcaneal bursitis 05/11/2014   Pain in joint, ankle and foot 05/11/2014   Heart burn    Joint pain    Abnormal Pap smear    Osteopenia     PCP: Charlott Dorn LABOR, MD  REFERRING PROVIDER: Leonce Katz, DO  REFERRING DIAG: Cervicalgia; Pain of right hip  THERAPY DIAG:  Pain in right hip  Muscle weakness (generalized)  Rationale for Evaluation and  Treatment: Rehabilitation  ONSET DATE: Chronic, approximately 3 months   SUBJECTIVE SUBJECTIVE STATEMENT: Patient reports she is doing well. There was a day or so that she did feels some right hip pain especially with stairs.  Eval: Patient reports neck pain and tightening of her shoulders for close to 3 months with gradual worsening of pain. Occasionally she turns it and feels/hears this scraping and popping. She states she currently feels discomfort in the left side of the neck and the longer she sits the more difficulty it gets, also turning her neck to the left. She denies any pain into the shoulder or arm. She plays a lot of golf and is uncomfortable after she plays. She did have a left arthroscopic shoulder surgery 3-4 years ago and mastectomy due to breast cancer that may have contributed to worsening posture. She also reports some right hip pain on the outside of the hip, and she did get a shot at her last appointment which seemed to help. She does see a trainer 2x/week.   PERTINENT HISTORY:  See PMH above  PAIN:  Are you having pain? Yes:  NPRS scale: 0/10 Pain location: Left side of neck Pain description: Sharp, tight Aggravating factors: Sitting, turning neck to left, golf Relieving factors: Medication  NPRS scale: 0/10 Pain location: Right hip Pain description: Achy Aggravating factors: Standing, walking,  Relieving factors: Medication  PRECAUTIONS: None  PATIENT GOALS: Get rid of pain, learn how to manage pain and exercises at home, golf without limitation   OBJECTIVE:  Note: Objective measures were completed at Evaluation unless otherwise noted. PATIENT SURVEYS:  PSFS: 5.33 Sitting extended periods: 6 Turning neck to left: 4 Golf: 6  07/27/2024: PSFS: 7.33 Sitting extended periods: 8 Turning neck to left: 8 Golf: 6  POSTURE:    Mild rounded shoulder and forward head posture  PALPATION: Tender to palpation left upper trap region   Hypomobility noted  of the cervical and thoracic spine  CERVICAL ROM:   Active ROM A/PROM (deg) eval   05/30/2024   07/27/2024  Flexion 35    Extension 45    Right lateral flexion 25    Left lateral flexion 30    Right rotation 70    Left rotation 55 62 70   (Blank rows = not tested)  UPPER EXTREMITY ROM:  Active ROM Right eval Left eval  Shoulder flexion 150 150  Shoulder extension    Shoulder abduction    Shoulder adduction    Shoulder extension    Shoulder internal rotation    Shoulder external rotation    Elbow flexion    Elbow extension  Wrist flexion    Wrist extension    Wrist ulnar deviation    Wrist radial deviation    Wrist pronation    Wrist supination     (Blank rows = not tested)  UPPER EXTREMITY MMT:  MMT Right eval Left eval  Shoulder flexion 5 5  Shoulder extension 5 4+  Shoulder abduction 5 5  Shoulder adduction    Shoulder extension    Shoulder internal rotation 5 5  Shoulder external rotation 5 4+  Middle trapezius 4 4-  Lower trapezius    Elbow flexion    Elbow extension    Wrist flexion    Wrist extension    Wrist ulnar deviation    Wrist radial deviation    Wrist pronation    Wrist supination    Grip strength     (Blank rows = not tested)  CERVICAL SPECIAL TESTS:  Cervical radicular testing negative  LOWER EXTREMITY MMT:    MMT Right eval Left eval Rt / Lt 06/13/2024 Rt / Lt 07/27/2024  Hip flexion      Hip extension   4- / 4- 4- / 4-  Hip abduction   4- / 4 4- / 4  Hip adduction      Hip internal rotation      Hip external rotation      Knee flexion      Knee extension      Ankle dorsiflexion      Ankle plantarflexion      Ankle inversion      Ankle eversion       (Blank rows = not tested)   FUNCTIONAL TESTS:  DNF endurance: 7 seconds  07/27/2024: 18 seconds   TREATMENT [facility] Adult PT Treatment:                                                DATE: 08/11/2024 Recumbent bike L4 x 5 min to improve endurance and workload  capacity Deadlift with 40# x 10, 50# 3 x 8 Side clamshell with green and small ball between feet 3 x 15 Bridge with black at knees 2 x 10 Forward runner step-up on 6 box  Hip mobs LAD and posterior/lateral  PATIENT EDUCATION:  Education details: HEP Person educated: Patient Education method: Programmer, Multimedia, Facilities Manager, Actor cues, Verbal cues Education comprehension: verbalized understanding, returned demonstration, verbal cues required, tactile cues required, and needs further education  HOME EXERCISE PROGRAM: Access Code: [email or web address]   ASSESSMENT: CLINICAL IMPRESSION: Patient tolerated therapy well with no adverse effects. Therapy focused on continued strengthening for the core and hips. She did well with her strengthening but did report increased right hip pain and pinching sensation when performing forward step-ups so performed hip mobs on the right with good therapeutic benefit and patient reporting relief of pain. No changes made to her HEP this visit. Patient would benefit from continued skilled PT to progress mobility and strength in order to reduce pain and maximize functional ability.   Eval: Patient is a 74 y.o. female who was seen today for physical therapy evaluation and treatment for chronic neck and right hip pain. Primarily focused on assessing neck this visit. She does exhibit some spinal mobility deficits and muscular tightness that is impacting her range of motion. She also demonstrates postural deviations and DNF endurance impairment. She does have some residual strength deficit of the  left shoulder that may be contributing to her symptoms.    OBJECTIVE IMPAIRMENTS: decreased activity tolerance, decreased ROM, decreased strength, impaired flexibility, postural dysfunction, and pain.   ACTIVITY LIMITATIONS: lifting and sitting  PARTICIPATION LIMITATIONS: community activity  PERSONAL FACTORS: Past/current experiences and Time since onset of injury/illness/exacerbation  are also affecting patient's functional outcome.    GOALS: Goals reviewed with patient? Yes  SHORT TERM GOALS: Target date: 06/21/2024  Patient will be I with initial HEP in order to progress with therapy. Baseline: HEP provided at eval 07/01/2024: independent Goal status: MET  2.  Patient will demonstrate left cervical rotation >/= 70 deg in order to reduce neck pain and limitation with golf Baseline: 55 deg 07/01/2024: not recently assessed 07/27/2024: 70 Goal status: MET  LONG TERM GOALS: Target date: 09/21/2024  Patient will be I with final HEP to maintain progress from PT. Baseline: HEP provided at eval 07/27/2024: progressing Goal status: ONGOING  2.  Patient will report PSFS >/= 8 in order to indicate improvement in their functional ability. Baseline: 5.33 07/27/2024: 7.33 Goal status: ONGOING  3.  Patient will demonstrate DNF endurance >/= 30 seconds in order to improve postural control and reduce neck pain with sitting Baseline:  07/27/2024: 18 seconds Goal status: ONGOING  4.  Patient will report neck pain </= 2/10 with all activity and cervical motion in order to reduce functional limitations Baseline: 7-8/10 pain at worse 07/27/2024: denies any neck pain Goal status: MET   PLAN: PT FREQUENCY: 1-2x/week  PT DURATION: 8 weeks  PLANNED INTERVENTIONS: 97164- PT Re-evaluation, 97750- Physical Performance Testing, 97110-Therapeutic exercises, 97530- Therapeutic activity, 97112- Neuromuscular re-education, 97535- Self Care, 02859- Manual therapy, 20560 (1-2 muscles), 20561 (3+ muscles)- Dry Needling, Patient/Family education, Taping, Joint mobilization, Joint manipulation, Spinal manipulation, Spinal mobilization, Cryotherapy, and Moist heat  PLAN FOR NEXT SESSION: Review HEP and progress PRN, continue progression of right hip strengthening and stability, manual/TPDN for cervical and left upper trap region, progress cervical and thoracic mobility, postural  strengthening and DNF endurance   Elaine Daring, PT, DPT, LAT, ATC 08/11/24  2:48 PM Phone: [phone] Fax: [phone]       "

## 2024-08-15 ENCOUNTER — Encounter: Admitting: Physical Therapy

## 2024-08-17 ENCOUNTER — Ambulatory Visit: Admitting: Physical Therapy

## 2024-08-17 ENCOUNTER — Encounter: Payer: Self-pay | Admitting: Physical Therapy

## 2024-08-17 ENCOUNTER — Other Ambulatory Visit: Payer: Self-pay

## 2024-08-17 DIAGNOSIS — M25551 Pain in right hip: Secondary | ICD-10-CM

## 2024-08-17 DIAGNOSIS — M6281 Muscle weakness (generalized): Secondary | ICD-10-CM

## 2024-08-17 NOTE — Therapy (Signed)
 " OUTPATIENT PHYSICAL THERAPY TREATMENT     Patient Name: Sabrina Tapia MRN: [id number] DOB:[birth year], 74 y.o., female Today's Date: 08/17/2024   END OF SESSION:  PT End of Session - 08/17/24 1101     Visit Number 12    Number of Visits 27    Date for Recertification  09/21/24    Authorization Type [employer] MCR    Authorization Time Period 08/03/2024 - 11/01/2024    Authorization - Visit Number 3    Authorization - Number of Visits 8    Progress Note Due on Visit 20    PT Start Time 1100    PT Stop Time 1145    PT Time Calculation (min) 45 min    Activity Tolerance Patient tolerated treatment well    Behavior During Therapy WFL for tasks assessed/performed                     Past Medical History:  Diagnosis Date   Allergy-induced asthma    prn inhaler   Arterial calcification    sees pcp for takes atorvastatin  for   CAD (coronary artery disease)    Constipation    Emphysema of lung (HCC)    Endometrial thickening on ultrasound    Environmental and seasonal allergies    Family history of adverse reaction to anesthesia    pt's father has hx. of being hard to wake up post-op x 1 time after 8 hour whipple surgery   GERD (gastroesophageal reflux disease)    Hemorrhoids    Hisory of Migraines    History of basal cell carcinoma (BCC) excision    right clavicle   History of breast cancer 2017   bilateral   History of radiation therapy    08/19/16- 09/29/16, Right Breast 50 Gy in 25 fractions, Right Breast boost 10 Gy in 5 fractions, Left Breast 50.4 Gy in 28 fractions   Hypertension    OSA (obstructive sleep apnea)    uses dental appliance mild /moderate osa   Osteoarthritis    hands and feet   Osteopenia 02/2016   T score -1.3 FRAX 8.3%/0.7% stable from prior DEXA   Overactive bladder    Stroke (HCC)    Thyroid  nodule 2012   Solitary nodule in the lower pole of the left thyroid  lobe , pt unaware   Wears glasses for reading    Wears hearing aid in both  ears    Past Surgical History:  Procedure Laterality Date   BREAST RECONSTRUCTION WITH PLACEMENT OF TISSUE EXPANDER AND FLEX HD (ACELLULAR HYDRATED DERMIS) Left 07/01/2016   Procedure: BREAST RECONSTRUCTION WITH PLACEMENT OF TISSUE EXPANDER AND  ALLODERM;  Surgeon: Earlis Ranks, MD;  Location: [facility];  Service: Plastics;  Laterality: Left;   BROW LIFT     COLONOSCOPY  2022   DILATATION & CURETTAGE/HYSTEROSCOPY WITH MYOSURE N/A 12/06/2018   Procedure: DILATATION & CURETTAGE/HYSTEROSCOPY WITH MYOSURE;  Surgeon: Cathlyn JAYSON Nikki Bobie FORBES, MD;  Location: [facility];  Service: Gynecology;  Laterality: N/A;  follow 1st case   DILATATION & CURETTAGE/HYSTEROSCOPY WITH MYOSURE N/A 04/21/2022   Procedure: DILATATION & CURETTAGE/HYSTEROSCOPY;  Surgeon: Cathlyn JAYSON Nikki Bobie FORBES, MD;  Location: [facility];  Service: Gynecology;  Laterality: N/A;   INCISION AND DRAINAGE Left 06/02/2016   Procedure: DRAINAGE of left axilla seroma;  Surgeon: Donnice Bury, MD;  Location: [facility];  Service: General;  Laterality: Left;   LIPOSUCTION WITH LIPOFILLING Left  06/11/2018   Procedure: Lipofilling from abdomen to left chest;  Surgeon: Arelia Filippo, MD;  Location: [facility];  Service: Plastics;  Laterality: Left;   MASTOPEXY Right 04/17/2017   Procedure: RIGHT MASTOPEXY;  Surgeon: Arelia Filippo, MD;  Location: [facility];  Service: Plastics;  Laterality: Right;   NIPPLE SPARING MASTECTOMY Left 07/01/2016   Procedure: LEFT NIPPLE SPARING MASTECTOMY;  Surgeon: Donnice Bury, MD;  Location: [facility];  Service: General;  Laterality: Left;   OPERATIVE ULTRASOUND N/A 04/21/2022   Procedure: OPERATIVE ULTRASOUND;  Surgeon: Cathlyn JAYSON Nikki Bobie FORBES, MD;  Location: [facility];  Service: Gynecology;  Laterality: N/A;   RADIOACTIVE SEED GUIDED PARTIAL MASTECTOMY WITH AXILLARY  SENTINEL LYMPH NODE BIOPSY Right 03/26/2016   Procedure: RIGHT BREAST LUMPECTOMY WITH RADIOACTIVE SEED AND SENTINEL LYMPH NODE BIOPSY;  Surgeon: Donnice Bury, MD;  Location: [facility];  Service: General;  Laterality: Right;   RADIOACTIVE SEED GUIDED PARTIAL MASTECTOMY WITH AXILLARY SENTINEL LYMPH NODE BIOPSY Left 05/20/2016   Procedure: LEFT BREAST RADIOACTIVE SEED (two seeds) GUIDED LUMPECTOMY WITH LEFT AXILLARY SENTINEL LYMPH NODE BIOPSY AND LEFT SEED TARGETED AXILLARY LYMPH NODE EXCISION;  Surgeon: Donnice Bury, MD;  Location: [facility];  Service: General;  Laterality: Left;   RE-EXCISION OF BREAST CANCER,SUPERIOR MARGINS Right 05/20/2016   Procedure: RE-EXCISION OF RIGHT BREAST MARGIN;  Surgeon: Donnice Bury, MD;  Location: [facility];  Service: General;  Laterality: Right;   RE-EXCISION OF BREAST CANCER,SUPERIOR MARGINS Left 06/02/2016   Procedure: RE-EXCISION OF BREAST CANCER,SUPERIOR MARGINS;  Surgeon: Donnice Bury, MD;  Location: [facility];  Service: General;  Laterality: Left;   REMOVAL OF TISSUE EXPANDER AND PLACEMENT OF IMPLANT Left 04/17/2017   Procedure: REMOVAL OF LEFT TISSUE EXPANDER WITH PLACEMENT OF LEFT SILICONE BREAST IMPLANT;  Surgeon: Arelia Filippo, MD;  Location: [facility];  Service: Plastics;  Laterality: Left;   ROBOTIC ASSISTED LAPAROSCOPIC LYSIS OF ADHESION N/A 06/26/2022   Procedure: XI ROBOTIC ASSISTED LAPAROSCOPIC LYSIS OF ADHESION;  Surgeon: Viktoria Comer SAUNDERS, MD;  Location: [facility];  Service: Gynecology;  Laterality: N/A;   ROBOTIC ASSISTED TOTAL HYSTERECTOMY WITH BILATERAL SALPINGO OOPHERECTOMY N/A 06/26/2022   Procedure: XI ROBOTIC ASSISTED TOTAL HYSTERECTOMY WITH BILATERAL SALPINGO OOPHORECTOMY, MINI LAPAROTOMY;  Surgeon: Viktoria Comer SAUNDERS, MD;  Location: [facility];  Service: Gynecology;  Laterality: N/A;   shoulder surgery for arthritis Left 07/2021   arthroscopic  done at [facility]   Patient Active Problem List   Diagnosis Date Noted   Pelvic adhesions 06/26/2022   Degenerative tear of medial meniscus 01/23/2022   Greater trochanteric bursitis of right hip 06/11/2021   Arthritis of carpometacarpal [facility]) joint of left thumb 06/11/2021   Mass of right lung 07/13/2020   Arthritis 08/03/2019   Rosacea 08/03/2019   Pes anserinus bursitis of right knee 03/28/2019   Pain in right knee 03/22/2019   Thickened endometrium 10/24/2018   Adhesive capsulitis of right shoulder 10/18/2018   Pain in right hand 10/05/2018   Tear of biceps tendon 09/21/2018   Moderate persistent asthma, uncomplicated 06/29/2017   Hallux rigidus of right foot 06/15/2017   Left Achilles tendinitis 06/15/2017   History of bilateral breast cancer 04/23/2017   Mild persistent asthma, uncomplicated 02/12/2017   Other seasonal allergic rhinitis 02/12/2017   History of therapeutic radiation 02/11/2017   Malignant neoplasm of overlapping sites of left breast in female, estrogen receptor positive (HCC) 10/27/2016  Sinusitis 09/01/2016   Hyponatremia 09/01/2016   Acquired absence of left breast 07/16/2016   Malignant neoplasm of upper-outer quadrant of right breast in female, estrogen receptor positive (HCC) 04/19/2016   Dizziness after extension of neck 02/19/2016   Sensorineural hearing loss (SNHL), bilateral 02/19/2016   Tinnitus of both ears 02/19/2016   Bunion of great toe of right foot 04/04/2015   Hemorrhoid 11/23/2014   Anal skin tag 11/23/2014   Incontinence of urine 07/28/2014   Retrocalcaneal bursitis 05/11/2014   Pain in joint, ankle and foot 05/11/2014   Heart burn    Joint pain    Abnormal Pap smear    Osteopenia     PCP: Charlott Dorn LABOR, MD  REFERRING PROVIDER: Leonce Katz, DO  REFERRING DIAG: Cervicalgia; Pain of right hip  THERAPY DIAG:  Pain in right hip  Muscle weakness (generalized)  Rationale for Evaluation and  Treatment: Rehabilitation  ONSET DATE: Chronic, approximately 3 months   SUBJECTIVE SUBJECTIVE STATEMENT: Patient reports continued difficulty and right hip pain with walking up steps. States she is about the same as last visit.   Eval: Patient reports neck pain and tightening of her shoulders for close to 3 months with gradual worsening of pain. Occasionally she turns it and feels/hears this scraping and popping. She states she currently feels discomfort in the left side of the neck and the longer she sits the more difficulty it gets, also turning her neck to the left. She denies any pain into the shoulder or arm. She plays a lot of golf and is uncomfortable after she plays. She did have a left arthroscopic shoulder surgery 3-4 years ago and mastectomy due to breast cancer that may have contributed to worsening posture. She also reports some right hip pain on the outside of the hip, and she did get a shot at her last appointment which seemed to help. She does see a trainer 2x/week.   PERTINENT HISTORY:  See PMH above  PAIN:  Are you having pain? Yes:  NPRS scale: 0/10 Pain location: Left side of neck Pain description: Sharp, tight Aggravating factors: Sitting, turning neck to left, golf Relieving factors: Medication  NPRS scale: 0/10 Pain location: Right hip Pain description: Achy Aggravating factors: Standing, walking,  Relieving factors: Medication  PRECAUTIONS: None  PATIENT GOALS: Get rid of pain, learn how to manage pain and exercises at home, golf without limitation   OBJECTIVE:  Note: Objective measures were completed at Evaluation unless otherwise noted. PATIENT SURVEYS:  PSFS: 5.33 Sitting extended periods: 6 Turning neck to left: 4 Golf: 6  07/27/2024: PSFS: 7.33 Sitting extended periods: 8 Turning neck to left: 8 Golf: 6  POSTURE:    Mild rounded shoulder and forward head posture  PALPATION: Tender to palpation left upper trap region   Hypomobility  noted of the cervical and thoracic spine  CERVICAL ROM:   Active ROM A/PROM (deg) eval   05/30/2024   07/27/2024  Flexion 35    Extension 45    Right lateral flexion 25    Left lateral flexion 30    Right rotation 70    Left rotation 55 62 70   (Blank rows = not tested)  UPPER EXTREMITY ROM:  Active ROM Right eval Left eval  Shoulder flexion 150 150  Shoulder extension    Shoulder abduction    Shoulder adduction    Shoulder extension    Shoulder internal rotation    Shoulder external rotation    Elbow flexion  Elbow extension    Wrist flexion    Wrist extension    Wrist ulnar deviation    Wrist radial deviation    Wrist pronation    Wrist supination     (Blank rows = not tested)  UPPER EXTREMITY MMT:  MMT Right eval Left eval  Shoulder flexion 5 5  Shoulder extension 5 4+  Shoulder abduction 5 5  Shoulder adduction    Shoulder extension    Shoulder internal rotation 5 5  Shoulder external rotation 5 4+  Middle trapezius 4 4-  Lower trapezius    Elbow flexion    Elbow extension    Wrist flexion    Wrist extension    Wrist ulnar deviation    Wrist radial deviation    Wrist pronation    Wrist supination    Grip strength     (Blank rows = not tested)  CERVICAL SPECIAL TESTS:  Cervical radicular testing negative  LOWER EXTREMITY MMT:    MMT Right eval Left eval Rt / Lt 06/13/2024 Rt / Lt 07/27/2024  Hip flexion      Hip extension   4- / 4- 4- / 4-  Hip abduction   4- / 4 4- / 4  Hip adduction      Hip internal rotation      Hip external rotation      Knee flexion      Knee extension      Ankle dorsiflexion      Ankle plantarflexion      Ankle inversion      Ankle eversion       (Blank rows = not tested)   FUNCTIONAL TESTS:  DNF endurance: 7 seconds  07/27/2024: 18 seconds   TREATMENT [facility] Adult PT Treatment:                                                DATE: 08/17/2024 Recumbent bike L4 x 5 min to improve endurance and  workload capacity Hip mobs LAD, posterior/lateral, inferior Side clamshell with blue and small ball between feet 4 x 10 Side reverse clamshell with small ball between knees 3 x 10 Bridge with blue at knees 2 x 10 Figure-4 bridge 2 x 10 each Goblet squat to table tap with 15# 3 x 10 Forward step-up on 4 box 3 x 10  PATIENT EDUCATION:  Education details: HEP Person educated: Patient Education method: Programmer, Multimedia, Demonstration, Actor cues, Verbal cues Education comprehension: verbalized understanding, returned demonstration, verbal cues required, tactile cues required, and needs further education  HOME EXERCISE PROGRAM: Access Code: [email or web address]   ASSESSMENT: CLINICAL IMPRESSION: Patient tolerated therapy well with no adverse effects. Therapy focused on manual to reduce right hip pain and improve mobility, and progressing right hip control and strength with good tolerance. Refocused on gluteal engagement this visit and incorporated more deep hip rotator strengthening and control. Progressed with her squat strengthening and continued to work on step-ups from a lower height with better tolerance for the right hip. No changes made to her HEP this visit. Patient would benefit from continued skilled PT to progress mobility and strength in order to reduce pain and maximize functional ability.   Eval: Patient is a 74 y.o. female who was seen today for physical therapy evaluation and treatment for chronic neck and right hip pain. Primarily focused on assessing neck this  visit. She does exhibit some spinal mobility deficits and muscular tightness that is impacting her range of motion. She also demonstrates postural deviations and DNF endurance impairment. She does have some residual strength deficit of the left shoulder that may be contributing to her symptoms.    OBJECTIVE IMPAIRMENTS: decreased activity tolerance, decreased ROM, decreased strength, impaired flexibility, postural dysfunction, and  pain.   ACTIVITY LIMITATIONS: lifting and sitting  PARTICIPATION LIMITATIONS: community activity  PERSONAL FACTORS: Past/current experiences and Time since onset of injury/illness/exacerbation are also affecting patient's functional outcome.    GOALS: Goals reviewed with patient? Yes  SHORT TERM GOALS: Target date: 06/21/2024  Patient will be I with initial HEP in order to progress with therapy. Baseline: HEP provided at eval 07/01/2024: independent Goal status: MET  2.  Patient will demonstrate left cervical rotation >/= 70 deg in order to reduce neck pain and limitation with golf Baseline: 55 deg 07/01/2024: not recently assessed 07/27/2024: 70 Goal status: MET  LONG TERM GOALS: Target date: 09/21/2024  Patient will be I with final HEP to maintain progress from PT. Baseline: HEP provided at eval 07/27/2024: progressing Goal status: ONGOING  2.  Patient will report PSFS >/= 8 in order to indicate improvement in their functional ability. Baseline: 5.33 07/27/2024: 7.33 Goal status: ONGOING  3.  Patient will demonstrate DNF endurance >/= 30 seconds in order to improve postural control and reduce neck pain with sitting Baseline:  07/27/2024: 18 seconds Goal status: ONGOING  4.  Patient will report neck pain </= 2/10 with all activity and cervical motion in order to reduce functional limitations Baseline: 7-8/10 pain at worse 07/27/2024: denies any neck pain Goal status: MET   PLAN: PT FREQUENCY: 1-2x/week  PT DURATION: 8 weeks  PLANNED INTERVENTIONS: 97164- PT Re-evaluation, 97750- Physical Performance Testing, 97110-Therapeutic exercises, 97530- Therapeutic activity, 97112- Neuromuscular re-education, 97535- Self Care, 02859- Manual therapy, 20560 (1-2 muscles), 20561 (3+ muscles)- Dry Needling, Patient/Family education, Taping, Joint mobilization, Joint manipulation, Spinal manipulation, Spinal mobilization, Cryotherapy, and Moist heat  PLAN FOR NEXT SESSION:  Review HEP and progress PRN, continue progression of right hip strengthening and stability, manual/TPDN for cervical and left upper trap region, progress cervical and thoracic mobility, postural strengthening and DNF endurance   Elaine Daring, PT, DPT, LAT, ATC 08/17/24  12:41 PM Phone: [phone] Fax: [phone]       "

## 2024-08-18 NOTE — Progress Notes (Unsigned)
 "               [name] [address] [place] [address] Phone: [phone]   Assessment and Plan:     1. Right hip pain (Primary) 2. Chronic right-sided low back pain with right-sided sciatica 3.  Lumbar radiculopathy -Chronic with exacerbation, subsequent visit - Recurrence of right lateral/posterior hip pain.  Now with progressive right sided low back pain.  Most consistent with lumbar etiology for patient's ongoing symptoms.  Differential includes hip pathology such as intra-articular labral tear versus gluteal tendinopathy. - Patient was previously improving.  We have trialed gluteal tendon CSI and greater trochanteric CSI.  Patient states she did not feel significantly better after these injections.  She is continuing pain with HEP and physical therapy. - Use meloxicam  15 mg daily as needed for breakthrough pain.  Recommend limiting chronic NSAIDs to 1-2 doses per week to prevent long-term side effects. Use Tylenol  500 to 1000 mg tablets 2-3 times a day as needed for day-to-day pain relief.    - Continue HEP and physical therapy - X-ray obtained in clinic.  My interpretation: No acute fracture or vertebral collapse.  Moderate degenerative changes throughout lumbar spine including facet arthrosis, DDD, grade 1 retrolisthesis L2 on L3 - Recommend further evaluation with lumbar MRI, right hip MRI to evaluate for possible spinal stenosis, neurologic impingement, gluteal muscle/tendinous tearing, right hip intra-articular labral tear.  MRIs unnecessary due to failure to improve despite >6 weeks of conservative therapy, pain with day-to-day activities, pain >6/10    Pertinent previous records reviewed include none   Follow Up: 1 week after MRI to review results and discuss treatment plan.  May be done virtually   Subjective:   I, Chestine Reeves, am serving as a neurosurgeon for Doctor Morene Mace   Chief Complaint: upper trap pain   HPI:     03/11/2024 Patient is a 74 year old female with upper trap pain. Patient states that she is having cervical and L shoulder pain for past 6 weeks. Took meloxicam  for 8 days which was helpful. Pain occurs with cervical rotation. Still able to workout as pain is always after activity. Had 2 migraines in past month. Hx L shoulder arthroscopy.    04/05/2024 Patient states neck pain is a little better but not much    05/12/2024 Patient states upper trap is tight. Right hip pain, decreased ROM. Doesn't know if meloxicam  has helped.    06/27/24 Patient states neck is fine. Had dry needling from Dr. Elaine. Hip has gone south. Been trying to strengthen the muscles in PT but can still feel pain in it.    08/02/2024 Patient states  hip pain is almost completely gone   08/19/2024 Patient states hip pain has come back . PT has been flaring but has been helping. Is going on a wellness retreat in a little . Low back is starting to act up as well   Relevant Historical Information: history of breast cancer   Additional pertinent review of systems negative.  Current Medications[1]   Objective:     Vitals:   08/19/24 1441  BP: 124/72  Pulse: 84  SpO2: 99%  Weight: 162 lb (73.5 kg)  Height: 5' 3 (1.6 m)      Body mass index is 28.7 kg/m.    Physical Exam:    General: awake, alert, and oriented no acute distress, nontoxic Skin: no suspicious lesions or rashes Neuro:sensation intact distally  with no deficits, normal muscle tone, no atrophy, strength 5/5 in all tested lower ext groups Psych: normal mood and affect, speech clear   Right hip: No deformity, swelling or wasting ROM Flexion 90, ext 30, IR 45, ER 45 TTP greater trochanter, gluteal musculature, IT band NTTP over the hip flexors, si joint, lumbar spine Negative log roll with FROM Negative FABER Negative FADIR Gait normal    Back - Normal skin, Spine with normal alignment and no deformity.   No tenderness to vertebral process  palpation.   Right lumbar paraspinous muscles are tender and without spasm Straight leg raise positive right Trendelenberg negative Piriformis Test negative for radicular symptoms, though increased pain on right    Electronically signed by:  [name] 3:04 PM 08/19/24     [1]  Current Outpatient Medications:    albuterol  (PROAIR  HFA) 108 (90 Base) MCG/ACT inhaler, Inhale 2 puffs into the lungs every 4 (four) hours as needed for wheezing or shortness of breath., Disp: 1 Inhaler, Rfl: 1   ALPRAZolam  (XANAX ) 0.25 MG tablet, Take 0.25 mg by mouth daily as needed (travel)., Disp: , Rfl:    amLODipine (NORVASC) 2.5 MG tablet, Take 2.5 mg by mouth daily., Disp: , Rfl:    atorvastatin (LIPITOR) 10 MG tablet, Take 10 mg by mouth at bedtime., Disp: , Rfl:    azelastine  (ASTELIN ) 0.1 % nasal spray, Place 1 spray into both nostrils daily., Disp: , Rfl:    cetirizine (ZYRTEC) 10 MG tablet, Take 10 mg by mouth at bedtime., Disp: , Rfl:    fluticasone  (FLONASE ) 50 MCG/ACT nasal spray, Place 1 spray into both nostrils daily., Disp: , Rfl:    hydrocortisone  (ANUSOL -HC) 2.5 % rectal cream, Place 1 application rectally as needed for hemorrhoids or itching., Disp: , Rfl:    ibuprofen  (ADVIL ) 200 MG tablet, Take 200 mg by mouth every 6 (six) hours as needed for moderate pain., Disp: , Rfl:    meloxicam  (MOBIC ) 15 MG tablet, Take 1 tablet (15 mg total) by mouth daily., Disp: 30 tablet, Rfl: 0   meloxicam  (MOBIC ) 15 MG tablet, Take 1 tablet (15 mg total) by mouth daily., Disp: 30 tablet, Rfl: 0   omeprazole (PRILOSEC) 20 MG capsule, Take 20 mg by mouth daily., Disp: , Rfl:    OVER THE COUNTER MEDICATION, Take 2 capsules by mouth daily. MEGASPOREBIOTIC, Disp: , Rfl:    psyllium (METAMUCIL) 58.6 % packet, Take 1 packet by mouth daily., Disp: , Rfl:    RESTASIS 0.05 % ophthalmic emulsion, Place 1 drop into both eyes 2 (two) times daily. (Patient not taking: Reported on  06/20/2024), Disp: , Rfl:    tamoxifen  (NOLVADEX ) 20 MG tablet, TAKE 1 TABLET(20 MG) BY MOUTH DAILY, Disp: 90 tablet, Rfl: 3   triamcinolone  (KENALOG) 0.1 %, Apply 1 Application topically daily as needed (psoriasis)., Disp: , Rfl:    valACYclovir  (VALTREX ) 1000 MG tablet, Take 1 tablet (1,000 mg total) by mouth 2 (two) times daily. Take for 1 day as directed above as needed. (Patient taking differently: Take 1,000 mg by mouth 2 (two) times daily as needed (fever blisters).), Disp: 30 tablet, Rfl: 1   valsartan-hydrochlorothiazide (DIOVAN-HCT) 160-12.5 MG tablet, Take 1 tablet by mouth daily., Disp: , Rfl:    venlafaxine  XR (EFFEXOR -XR) 75 MG 24 hr capsule, TAKE 1 CAPSULE(75 MG) BY MOUTH DAILY WITH BREAKFAST, Disp: 90 capsule, Rfl: 4   VITAMIN D PO, Take 2,000 Units by mouth daily., Disp: , Rfl:   "

## 2024-08-19 ENCOUNTER — Ambulatory Visit: Admitting: Sports Medicine

## 2024-08-19 ENCOUNTER — Ambulatory Visit

## 2024-08-19 VITALS — BP 124/72 | HR 84 | Ht 63.0 in | Wt 162.0 lb

## 2024-08-19 DIAGNOSIS — M5416 Radiculopathy, lumbar region: Secondary | ICD-10-CM | POA: Diagnosis not present

## 2024-08-19 DIAGNOSIS — M545 Low back pain, unspecified: Secondary | ICD-10-CM

## 2024-08-19 DIAGNOSIS — M5441 Lumbago with sciatica, right side: Secondary | ICD-10-CM

## 2024-08-19 DIAGNOSIS — M25551 Pain in right hip: Secondary | ICD-10-CM | POA: Diagnosis not present

## 2024-08-19 DIAGNOSIS — G8929 Other chronic pain: Secondary | ICD-10-CM

## 2024-08-19 NOTE — Patient Instructions (Addendum)
 MRI hip and low back   Schedule virtual visit for 1 week after MRI to discuss results

## 2024-08-22 ENCOUNTER — Encounter: Admitting: Physical Therapy

## 2024-08-22 ENCOUNTER — Ambulatory Visit: Payer: Self-pay | Admitting: Sports Medicine

## 2024-08-23 ENCOUNTER — Encounter: Payer: Self-pay | Admitting: Sports Medicine

## 2024-08-24 ENCOUNTER — Other Ambulatory Visit: Payer: Self-pay | Admitting: Sports Medicine

## 2024-08-25 ENCOUNTER — Encounter: Admitting: Physical Therapy

## 2024-08-25 ENCOUNTER — Encounter: Payer: Self-pay | Admitting: Hematology and Oncology

## 2024-08-30 ENCOUNTER — Ambulatory Visit
Admission: RE | Admit: 2024-08-30 | Discharge: 2024-08-30 | Disposition: A | Source: Ambulatory Visit | Attending: Sports Medicine

## 2024-08-30 DIAGNOSIS — G8929 Other chronic pain: Secondary | ICD-10-CM

## 2024-08-30 DIAGNOSIS — M5416 Radiculopathy, lumbar region: Secondary | ICD-10-CM

## 2024-08-30 DIAGNOSIS — M25551 Pain in right hip: Secondary | ICD-10-CM

## 2024-09-06 ENCOUNTER — Telehealth: Admitting: Sports Medicine

## 2025-06-19 ENCOUNTER — Inpatient Hospital Stay: Admitting: Hematology and Oncology
